# Patient Record
Sex: Male | Born: 1935 | Race: White | Hispanic: No | Marital: Married | Smoking: Former smoker
Health system: Southern US, Community
[De-identification: ages and names within clinical notes are randomized; demographics above are authoritative.]

## PROBLEM LIST (undated history)

## (undated) DIAGNOSIS — Z860100 Personal history of colon polyps, unspecified: Secondary | ICD-10-CM

## (undated) DIAGNOSIS — N183 Chronic kidney disease, stage 3 unspecified: Secondary | ICD-10-CM

## (undated) DIAGNOSIS — C661 Malignant neoplasm of right ureter: Secondary | ICD-10-CM

## (undated) DIAGNOSIS — I4891 Unspecified atrial fibrillation: Secondary | ICD-10-CM

## (undated) DIAGNOSIS — M1990 Unspecified osteoarthritis, unspecified site: Secondary | ICD-10-CM

## (undated) DIAGNOSIS — I2510 Atherosclerotic heart disease of native coronary artery without angina pectoris: Secondary | ICD-10-CM

## (undated) DIAGNOSIS — D494 Neoplasm of unspecified behavior of bladder: Secondary | ICD-10-CM

## (undated) DIAGNOSIS — K219 Gastro-esophageal reflux disease without esophagitis: Secondary | ICD-10-CM

## (undated) DIAGNOSIS — Z8719 Personal history of other diseases of the digestive system: Secondary | ICD-10-CM

## (undated) DIAGNOSIS — E785 Hyperlipidemia, unspecified: Secondary | ICD-10-CM

## (undated) DIAGNOSIS — K449 Diaphragmatic hernia without obstruction or gangrene: Secondary | ICD-10-CM

## (undated) DIAGNOSIS — I252 Old myocardial infarction: Secondary | ICD-10-CM

## (undated) DIAGNOSIS — Z951 Presence of aortocoronary bypass graft: Secondary | ICD-10-CM

## (undated) DIAGNOSIS — I509 Heart failure, unspecified: Secondary | ICD-10-CM

## (undated) DIAGNOSIS — I10 Essential (primary) hypertension: Secondary | ICD-10-CM

## (undated) DIAGNOSIS — Z8711 Personal history of peptic ulcer disease: Secondary | ICD-10-CM

## (undated) HISTORY — PX: CORONARY ARTERY BYPASS GRAFT: SHX141

## (undated) HISTORY — PX: CATARACT EXTRACTION W/ INTRAOCULAR LENS  IMPLANT, BILATERAL: SHX1307

---

## 1999-06-05 ENCOUNTER — Encounter: Payer: Self-pay | Admitting: Internal Medicine

## 1999-06-05 ENCOUNTER — Encounter: Admission: RE | Admit: 1999-06-05 | Discharge: 1999-06-05 | Payer: Self-pay | Admitting: Internal Medicine

## 1999-07-04 ENCOUNTER — Encounter: Payer: Self-pay | Admitting: Interventional Cardiology

## 1999-07-04 ENCOUNTER — Inpatient Hospital Stay (HOSPITAL_COMMUNITY): Admission: EM | Admit: 1999-07-04 | Discharge: 1999-07-13 | Payer: Self-pay | Admitting: Emergency Medicine

## 1999-07-05 HISTORY — PX: CARDIAC CATHETERIZATION: SHX172

## 1999-07-08 ENCOUNTER — Encounter: Payer: Self-pay | Admitting: Cardiothoracic Surgery

## 1999-07-09 ENCOUNTER — Encounter: Payer: Self-pay | Admitting: Cardiothoracic Surgery

## 1999-07-10 ENCOUNTER — Encounter: Payer: Self-pay | Admitting: Cardiothoracic Surgery

## 1999-07-11 ENCOUNTER — Encounter: Payer: Self-pay | Admitting: Cardiothoracic Surgery

## 2001-03-23 ENCOUNTER — Encounter: Payer: Self-pay | Admitting: Internal Medicine

## 2001-03-23 ENCOUNTER — Encounter: Admission: RE | Admit: 2001-03-23 | Discharge: 2001-03-23 | Payer: Self-pay | Admitting: Internal Medicine

## 2001-06-22 ENCOUNTER — Ambulatory Visit (HOSPITAL_COMMUNITY): Admission: RE | Admit: 2001-06-22 | Discharge: 2001-06-22 | Payer: Self-pay | Admitting: Gastroenterology

## 2002-11-25 ENCOUNTER — Encounter: Admission: RE | Admit: 2002-11-25 | Discharge: 2002-11-25 | Payer: Self-pay | Admitting: Internal Medicine

## 2005-02-14 ENCOUNTER — Encounter: Admission: RE | Admit: 2005-02-14 | Discharge: 2005-02-14 | Payer: Self-pay | Admitting: Internal Medicine

## 2007-02-05 ENCOUNTER — Encounter: Admission: RE | Admit: 2007-02-05 | Discharge: 2007-02-05 | Payer: Self-pay | Admitting: Internal Medicine

## 2010-06-14 NOTE — H&P (Signed)
[place]  Patient:    Angel Cooper, Angel Cooper                    MRN: [id number] Adm. Date:  [id number] Attending:  Cathren Laine CC:         Winn Jock. Earl Gala, M.D.                         History and Physical  REASON FOR ADMISSION:  Acute onset of epigastric discomfort.  SUBJECTIVE: The patient is 77 years of age and has a history of coronary artery disease. He suffered a "myocardial infarction" in 1989 based upon laboratory data obtained from an urgent care center in [place].  [facility], he underwent exercise treadmill testing by Dr. Earl Gala that did not demonstrate any evidence of ischemia and has been relatively asymptomatic since that time.  Since that episode, the patient has had no symptoms.  His symptoms at that time were epigastric discomfort.  The patient developed epigastric discomfort this morning and persisted most of the day.  He saw Dr. Dorna Bloom in the [facility] in the early afternoon, where the initial EKG revealed ST segment depression, V3 through V5.  Because of the EKG appearance and the patients history, he was admitted to the hospital to rule out myocardial infarction.  He is continuing to have some mild discomfort in his chest, although on IV nitroglycerin and IV heparin his symptoms are improved.  MEDICATIONS: Prilosec 20 mg per day. Viagra 50 mg 1/2 p.r.n. Tylenol p.r.n. Darvocet N 100 p.r.n.  Doxycycline for skin disorder was stopped 24 hours ago because of queasy stomach.  ALLERGIES:  Aspirin.  SIGNIFICANT MEDICAL PROBLEMS: 1. Coronary artery disease. 2. History of colon polyps diagnosed in 1989. 3. Benign positional vertigo. 4. History of peptic ulcer disease with duodenal ulcer in 1989. 5. Lower back pain, chronic. 6. Osteoarthritis.  FAMILY HISTORY:  Father died at age 24 of complications of hypertension. Mother died age 80 of heart failure and coronary artery  disease.  HABITS: Quit smoking in 1967.  Has alcohol rarely.  REVIEW OF SYSTEMS:  No specific complaints.  OBJECTIVE:  GENERAL: On exam the patient is in no acute distress.  VITAL SIGNS:  Blood pressure 122/88.  Heart rate is ranging between 50 and 80 in sinus rhythm.  SKIN: Clear.  No nail bed cyanosis is noted.  HEENT:  Pupils are equal and reactive to light.  Extraocular movements are full.  NECK: Reveals no JVD, carotid bruits or thyromegaly.  LUNGS: Clear to auscultation and percussion.  CARDIAC: Normal. No murmurs, rubs, clicks or gallops are heard.  ABDOMEN:  Soft.  Liver and spleen are not palpable.  Bowel sounds are normal. No pulsatile masses are noted.  EXTREMITIES: Reveal no edema. Femoral pulses are 2+. Radial pulses are 2+. Posterior tibial pulses are 2+. There is no edema.  NEUROLOGIC: Intact.  No motor or sensory deficits are noted.  LABORATORY DATA:  Chest x-ray:  Pending.  CK MB 120/9.0.  Troponin I 0.10. EKG #1 as outlined above with ST segment depression in V2 through V5. Subsequent EKGs did not reveal ST abnormality. Hemoglobin is normal. White blood cell count 13,000.  Lipase 21, amylase 55, potassium 3.9.  BUN and creatinine 13 and 0.9.  ASSESSMENT:  Acute coronary syndrome.  PLAN: 1. IV heparin. 2. IIb/III inhibitor, Integrilin. 3. IV nitroglycerin and IV morphine titrated to pain control. 4. Serial  enzymes and EKGs. 5. Consider GI workup if abdominal discomfort continues with abdominal CT scan    to rule out abdominal abdominal aortic aneurysm.  The patient will likely    need cardiac catheterization unless some other significant identifiable    problem arises. DD:  07/04/99 TD:  07/05/99 Job: [id number]

## 2010-06-14 NOTE — Cardiovascular Report (Signed)
[place]  Patient:    Angel Cooper, Angel Cooper                    MRN: [id number] Proc. Date: 07/05/99 Adm. Date:  [id number] Disc. Date: [id number] Attending:  Cathren Laine CC:         CVTS             Winn Jock. Earl Gala, M.D.             Wayne C. Dorna Bloom, M.D.                        Cardiac Catheterization  CINE NO. [id number]  INDICATION FOR STUDY:  Non-Q-wave myocardial infarction.  PROCEDURE PERFORMED: 1. Left heart catheterization. 2. Selective coronary angiography. 3. Left ventriculography.  DESCRIPTION OF PROCEDURE:  After informed consent, a 6-French sheath was inserted into the right femoral artery using the modified Seldinger technique. A 6-French A2 multipurpose catheter was used for hemodynamic recordings,  left ventriculography and right coronary angiography.  A #4 left Judkins catheter 6-French was used for left coronary angiography.  The patient tolerated the procedure without complications.  RESULTS:   I. Hemodynamic data      A. Aortic pressure 116/64 mmHg.      B. Left ventricular pressure 116/14 mmHg.  II. Left ventriculography:  There is inferoapical severe hypokinesis.  EF is      40-45%. III. Selective coronary angiography:  There is calcification in the vessels      involving the left coronary system that are noticeable by cine      fluoroscopy.      A. Left main coronary:  There is proximal 20% narrowing.      B. Left anterior descending coronary:  This is a large vessel that wraps        around the left ventricular apex.  Two large diagonal branches arise         proximally and from the mid vessel.  There is ostial 70-80% LAD         narrowing.  In the proximal LAD between the first and second         diagonal, there is a focal eccentric 70% stenosis.  The entire         proximal portion of the LAD is diffusely diseased.  The diagonal         branches are free of significant disease.  The LAD wraps around the          apex.      C. Circumflex artery:  The circumflex artery is large.  It gives origin         to two dominant obtuse marginal branches.  The first obtuse marginal         arises from the mid circumflex and contains an ostial 70-80% stenosis         seen only in the AP caudal view.  There is 50-60% ostial circumflex         disease and a segmental 80% stenosis in the distal circumflex before         the large obtuse marginal branch that supplies the inferolateral         wall.      D. Ramus intermedius branch:  A large ramus intermedius branch arises         from the left main and contains a 99% stenosis with TIMI-2 flow.  E. Right coronary:  The right coronary is totally occluded proximally         and fills late by right-to-right collaterals.  CONCLUSIONS: 1. Recent lateral and apical infarction due to transient occlusion of the    ramus intermedius branch. 2. Mild left ventricular dysfunction with EF of 45%. 3. Severe three-vessel coronary disease with high grade proximal and mid LAD    disease, subtotal occlusion of the ramus branch, significant first obtuse    marginal and mid circumflex stenosis, and total occlusion of the right    coronary.  RECOMMENDATION:  Coronary artery bypass grafting. DD:  07/05/99 TD:  07/09/99 Job: [id number]

## 2012-10-27 ENCOUNTER — Other Ambulatory Visit: Payer: Self-pay | Admitting: Internal Medicine

## 2012-10-27 DIAGNOSIS — R109 Unspecified abdominal pain: Secondary | ICD-10-CM

## 2012-10-29 ENCOUNTER — Inpatient Hospital Stay: Admission: RE | Admit: 2012-10-29 | Payer: Self-pay | Source: Ambulatory Visit

## 2012-11-04 ENCOUNTER — Ambulatory Visit
Admission: RE | Admit: 2012-11-04 | Discharge: 2012-11-04 | Disposition: A | Discharge status: Home / Self Care | Payer: Medicare Other | Source: Ambulatory Visit | Attending: Internal Medicine | Admitting: Internal Medicine

## 2012-11-04 DIAGNOSIS — R109 Unspecified abdominal pain: Secondary | ICD-10-CM

## 2012-11-04 MED ORDER — IOHEXOL 300 MG/ML  SOLN
100.0000 mL | Freq: Once | INTRAMUSCULAR | Status: AC | PRN
  Administered 2012-11-04: 100 mL via INTRAVENOUS

## 2012-11-24 ENCOUNTER — Other Ambulatory Visit: Payer: Self-pay | Admitting: Urology

## 2012-11-24 NOTE — Progress Notes (Signed)
Need orders in EPIC.  Surgery scheduled for 12/03/12.  Thank You.

## 2012-11-25 ENCOUNTER — Encounter (HOSPITAL_COMMUNITY): Payer: Self-pay | Admitting: Pharmacy Technician

## 2012-11-30 ENCOUNTER — Other Ambulatory Visit (HOSPITAL_COMMUNITY): Payer: Self-pay | Admitting: Urology

## 2012-11-30 NOTE — Patient Instructions (Addendum)
22 Angel Cooper  11/30/2012   Your procedure is scheduled on:  12/03/12  FRIDAY  Report to [facility] at  0830    AM.  Call this number if you have problems the morning of surgery: [phone]       Remember:   Do not eat food  Or drink :After Midnight. Thursday NIGHT   Take these medicines the morning of surgery with A SIP OF WATER: Omeprazole   .  Contacts, dentures or partial plates can not be worn to surgery  Leave suitcase in the car. After surgery it may be brought to your room.  For patients admitted to the hospital, checkout time is 11:00 AM day of  discharge.             SPECIAL INSTRUCTIONS- SEE [place] PREPARING FOR SURGERY INSTRUCTION SHEET-     DO NOT WEAR JEWELRY, LOTIONS, POWDERS, OR PERFUMES.  WOMEN-- DO NOT SHAVE LEGS OR UNDERARMS FOR 12 HOURS BEFORE SHOWERS. MEN MAY SHAVE FACE.  Patients discharged the day of surgery will not be allowed to drive home. IF going home the day of surgery, you must have a driver and someone to stay with you for the first 24 hours  Name and phone number of your driver:     Wife  Alison Stalling  PST [phone]  [phone]                 FAILURE TO FOLLOW THESE INSTRUCTIONS MAY RESULT IN  CANCELLATION   OF YOUR SURGERY                                                  Patient Signature _____________________________

## 2012-12-01 ENCOUNTER — Encounter (HOSPITAL_COMMUNITY)
Admission: RE | Admit: 2012-12-01 | Discharge: 2012-12-01 | Disposition: A | Discharge status: Home / Self Care | Payer: Medicare Other | Source: Ambulatory Visit | Attending: Urology | Admitting: Urology

## 2012-12-01 ENCOUNTER — Encounter (HOSPITAL_COMMUNITY): Payer: Self-pay

## 2012-12-01 ENCOUNTER — Ambulatory Visit (HOSPITAL_COMMUNITY)
Admission: RE | Admit: 2012-12-01 | Discharge: 2012-12-01 | Disposition: A | Discharge status: Home / Self Care | Payer: Medicare Other | Source: Ambulatory Visit | Attending: Urology | Admitting: Urology

## 2012-12-01 DIAGNOSIS — N289 Disorder of kidney and ureter, unspecified: Secondary | ICD-10-CM | POA: Insufficient documentation

## 2012-12-01 DIAGNOSIS — Z01818 Encounter for other preprocedural examination: Secondary | ICD-10-CM | POA: Insufficient documentation

## 2012-12-01 DIAGNOSIS — Z01812 Encounter for preprocedural laboratory examination: Secondary | ICD-10-CM | POA: Insufficient documentation

## 2012-12-01 DIAGNOSIS — Z01810 Encounter for preprocedural cardiovascular examination: Secondary | ICD-10-CM | POA: Insufficient documentation

## 2012-12-01 HISTORY — DX: Gastro-esophageal reflux disease without esophagitis: K21.9

## 2012-12-01 HISTORY — DX: Atherosclerotic heart disease of native coronary artery without angina pectoris: I25.10

## 2012-12-01 HISTORY — DX: Essential (primary) hypertension: I10

## 2012-12-01 LAB — BASIC METABOLIC PANEL
BUN: 17 mg/dL (ref 6–23)
CO2: 26 mEq/L (ref 19–32)
Calcium: 9.4 mg/dL (ref 8.4–10.5)
Chloride: 105 mEq/L (ref 96–112)
Creatinine, Ser: 1.12 mg/dL (ref 0.50–1.35)
GFR calc Af Amer: 70 mL/min — ABNORMAL LOW (ref >90)
GFR calc non Af Amer: 60 mL/min — ABNORMAL LOW (ref >90)
Glucose, Bld: 93 mg/dL (ref 70–99)
Potassium: 4.3 mEq/L (ref 3.5–5.1)
Sodium: 140 mEq/L (ref 135–145)

## 2012-12-01 LAB — CBC
HCT: 38.7 % — ABNORMAL LOW (ref 39.0–52.0)
Hemoglobin: 13.1 g/dL (ref 13.0–17.0)
MCH: 30.5 pg (ref 26.0–34.0)
MCHC: 33.9 g/dL (ref 30.0–36.0)
MCV: 90.2 fL (ref 78.0–100.0)
Platelets: 314 10*3/uL (ref 150–400)
RBC: 4.29 MIL/uL (ref 4.22–5.81)
RDW: 13.4 % (ref 11.5–15.5)
WBC: 9.4 10*3/uL (ref 4.0–10.5)

## 2012-12-01 NOTE — Progress Notes (Signed)
12/01/12 0828  OBSTRUCTIVE SLEEP APNEA  Have you ever been diagnosed with sleep apnea through a sleep study? No  Do you snore loudly (loud enough to be heard through closed doors)?  0  Do you often feel tired, fatigued, or sleepy during the daytime? 1  Has anyone observed you stop breathing during your sleep? 0  Do you have, or are you being treated for high blood pressure? 1  BMI more than 35 kg/m2? 0  Age over 77 years old? 1  Neck circumference greater than 40 cm/18 inches? 0  Gender: 1  Obstructive Sleep Apnea Score 4  Score 4 or greater  Results sent to PCP

## 2012-12-03 ENCOUNTER — Ambulatory Visit (HOSPITAL_COMMUNITY): Payer: Medicare Other | Admitting: Anesthesiology

## 2012-12-03 ENCOUNTER — Ambulatory Visit (HOSPITAL_COMMUNITY)
Admission: RE | Admit: 2012-12-03 | Discharge: 2012-12-03 | Disposition: A | Discharge status: Home / Self Care | Payer: Medicare Other | Source: Ambulatory Visit | Attending: Urology | Admitting: Urology

## 2012-12-03 ENCOUNTER — Encounter (HOSPITAL_COMMUNITY): Payer: Self-pay | Admitting: *Deleted

## 2012-12-03 ENCOUNTER — Encounter (HOSPITAL_COMMUNITY)
Admission: RE | Disposition: A | Discharge status: Home / Self Care | Payer: Self-pay | Source: Ambulatory Visit | Attending: Urology

## 2012-12-03 ENCOUNTER — Encounter (HOSPITAL_COMMUNITY): Payer: Medicare Other | Admitting: Anesthesiology

## 2012-12-03 DIAGNOSIS — R1033 Periumbilical pain: Secondary | ICD-10-CM | POA: Insufficient documentation

## 2012-12-03 DIAGNOSIS — R634 Abnormal weight loss: Secondary | ICD-10-CM | POA: Insufficient documentation

## 2012-12-03 DIAGNOSIS — I252 Old myocardial infarction: Secondary | ICD-10-CM | POA: Insufficient documentation

## 2012-12-03 DIAGNOSIS — I10 Essential (primary) hypertension: Secondary | ICD-10-CM | POA: Insufficient documentation

## 2012-12-03 DIAGNOSIS — K219 Gastro-esophageal reflux disease without esophagitis: Secondary | ICD-10-CM | POA: Insufficient documentation

## 2012-12-03 DIAGNOSIS — N2889 Other specified disorders of kidney and ureter: Secondary | ICD-10-CM

## 2012-12-03 DIAGNOSIS — E7800 Pure hypercholesterolemia, unspecified: Secondary | ICD-10-CM | POA: Insufficient documentation

## 2012-12-03 DIAGNOSIS — K449 Diaphragmatic hernia without obstruction or gangrene: Secondary | ICD-10-CM | POA: Insufficient documentation

## 2012-12-03 DIAGNOSIS — K5730 Diverticulosis of large intestine without perforation or abscess without bleeding: Secondary | ICD-10-CM | POA: Insufficient documentation

## 2012-12-03 DIAGNOSIS — R911 Solitary pulmonary nodule: Secondary | ICD-10-CM | POA: Insufficient documentation

## 2012-12-03 DIAGNOSIS — Q762 Congenital spondylolisthesis: Secondary | ICD-10-CM | POA: Insufficient documentation

## 2012-12-03 DIAGNOSIS — N289 Disorder of kidney and ureter, unspecified: Secondary | ICD-10-CM | POA: Insufficient documentation

## 2012-12-03 HISTORY — PX: CYSTOSCOPY WITH RETROGRADE PYELOGRAM, URETEROSCOPY AND STENT PLACEMENT: SHX5789

## 2012-12-03 SURGERY — CYSTOURETEROSCOPY, WITH RETROGRADE PYELOGRAM AND STENT INSERTION
Anesthesia: General | Laterality: Right | Wound class: Clean Contaminated

## 2012-12-03 MED ORDER — PROPOFOL 10 MG/ML IV BOLUS
INTRAVENOUS | Status: DC | PRN
  Administered 2012-12-03: 140 mg via INTRAVENOUS

## 2012-12-03 MED ORDER — LIDOCAINE HCL 2 % EX GEL
CUTANEOUS | Status: AC
  Filled 2012-12-03: qty 10

## 2012-12-03 MED ORDER — IOHEXOL 300 MG/ML  SOLN
INTRAMUSCULAR | Status: DC | PRN
  Administered 2012-12-03: 10 mL

## 2012-12-03 MED ORDER — OXYCODONE HCL 5 MG/5ML PO SOLN
5.0000 mg | Freq: Once | ORAL | Status: DC | PRN
  Filled 2012-12-03: qty 5

## 2012-12-03 MED ORDER — OXYCODONE HCL 5 MG PO TABS: 5.0000 mg | ORAL_TABLET | Freq: Once | ORAL | Status: DC | PRN

## 2012-12-03 MED ORDER — SODIUM CHLORIDE 0.9 % IR SOLN
Status: DC | PRN
  Administered 2012-12-03: 2000 mL via INTRAVESICAL

## 2012-12-03 MED ORDER — BELLADONNA ALKALOIDS-OPIUM 16.2-60 MG RE SUPP
RECTAL | Status: AC
  Filled 2012-12-03: qty 1

## 2012-12-03 MED ORDER — CIPROFLOXACIN IN D5W 400 MG/200ML IV SOLN
INTRAVENOUS | Status: AC
  Filled 2012-12-03: qty 200

## 2012-12-03 MED ORDER — MEPERIDINE HCL 50 MG/ML IJ SOLN: 6.2500 mg | INTRAMUSCULAR | Status: DC | PRN

## 2012-12-03 MED ORDER — DEXAMETHASONE SODIUM PHOSPHATE 10 MG/ML IJ SOLN
INTRAMUSCULAR | Status: DC | PRN
  Administered 2012-12-03: 10 mg via INTRAVENOUS

## 2012-12-03 MED ORDER — ACETAMINOPHEN-CODEINE #3 300-30 MG PO TABS: 1.0000 | ORAL_TABLET | ORAL | Status: DC | PRN

## 2012-12-03 MED ORDER — PROMETHAZINE HCL 25 MG/ML IJ SOLN: 6.2500 mg | INTRAMUSCULAR | Status: DC | PRN

## 2012-12-03 MED ORDER — LACTATED RINGERS IV SOLN
INTRAVENOUS | Status: DC
  Administered 2012-12-03: 1000 mL via INTRAVENOUS

## 2012-12-03 MED ORDER — HYDROMORPHONE HCL PF 1 MG/ML IJ SOLN
INTRAMUSCULAR | Status: AC
  Filled 2012-12-03: qty 1

## 2012-12-03 MED ORDER — TAMSULOSIN HCL 0.4 MG PO CAPS: 0.4000 mg | ORAL_CAPSULE | Freq: Every day | ORAL | Status: DC

## 2012-12-03 MED ORDER — HYDROMORPHONE HCL PF 1 MG/ML IJ SOLN
0.2500 mg | INTRAMUSCULAR | Status: DC | PRN
  Administered 2012-12-03: 0.5 mg via INTRAVENOUS

## 2012-12-03 MED ORDER — FENTANYL CITRATE 0.05 MG/ML IJ SOLN
INTRAMUSCULAR | Status: DC | PRN
  Administered 2012-12-03 (×4): 25 ug via INTRAVENOUS

## 2012-12-03 MED ORDER — LIDOCAINE HCL 2 % EX GEL
CUTANEOUS | Status: DC | PRN
  Administered 2012-12-03: 1 via URETHRAL

## 2012-12-03 MED ORDER — BELLADONNA ALKALOIDS-OPIUM 16.2-60 MG RE SUPP
RECTAL | Status: DC | PRN
  Administered 2012-12-03: 1 via RECTAL

## 2012-12-03 MED ORDER — LIDOCAINE HCL (CARDIAC) 20 MG/ML IV SOLN
INTRAVENOUS | Status: DC | PRN
  Administered 2012-12-03: 100 mg via INTRAVENOUS

## 2012-12-03 MED ORDER — CIPROFLOXACIN HCL 500 MG PO TABS: 500.0000 mg | ORAL_TABLET | Freq: Two times a day (BID) | ORAL | Status: DC

## 2012-12-03 MED ORDER — CIPROFLOXACIN IN D5W 400 MG/200ML IV SOLN
400.0000 mg | INTRAVENOUS | Status: AC
  Administered 2012-12-03: 400 mg via INTRAVENOUS

## 2012-12-03 MED ORDER — ONDANSETRON HCL 4 MG/2ML IJ SOLN
INTRAMUSCULAR | Status: DC | PRN
  Administered 2012-12-03: 4 mg via INTRAVENOUS

## 2012-12-03 SURGICAL SUPPLY — 14 items
BAG URO CATCHER STRL LF (DRAPE) ×2 IMPLANT
CATH URET 5FR 28IN CONE TIP (BALLOONS) ×1
CATH URET 5FR 70CM CONE TIP (BALLOONS) ×1 IMPLANT
CLOTH BEACON ORANGE TIMEOUT ST (SAFETY) ×2 IMPLANT
DRAPE CAMERA CLOSED 9X96 (DRAPES) ×2 IMPLANT
GLOVE BIOGEL M 8.0 STRL (GLOVE) ×2 IMPLANT
GOWN PREVENTION PLUS XLARGE (GOWN DISPOSABLE) ×2 IMPLANT
GOWN STRL REIN XL XLG (GOWN DISPOSABLE) ×2 IMPLANT
GUIDEWIRE STR DUAL SENSOR (WIRE) ×2 IMPLANT
MANIFOLD NEPTUNE II (INSTRUMENTS) ×2 IMPLANT
PACK CYSTO (CUSTOM PROCEDURE TRAY) ×2 IMPLANT
STENT CONTOUR 6FRX26X.038 (STENTS) ×1 IMPLANT
TUBING CONNECTING 10 (TUBING) ×2 IMPLANT
WIRE COONS/BENSON .038X145CM (WIRE) ×1 IMPLANT

## 2012-12-03 NOTE — Transfer of Care (Signed)
Immediate Anesthesia Transfer of Care Note  Patient: Angel Cooper  Procedure(s) Performed: Procedure(s): CYSTOSCOPY WITH RIGHT RETROGRADE PYELOGRAM,RIGHT URETERAL WASHINGS, RIGHT URETERAL BIOPSY, RIGHT URETEROSCOPY AND STENT PLACEMENT (Right)  Patient Location: PACU  Anesthesia Type:General  Level of Consciousness: awake, alert  and oriented  Airway & Oxygen Therapy: Patient Spontanous Breathing and Patient connected to face mask oxygen  Post-op Assessment: Report given to PACU RN and Post -op Vital signs reviewed and stable  Post vital signs: Reviewed and stable  Complications: No apparent anesthesia complications

## 2012-12-03 NOTE — Anesthesia Preprocedure Evaluation (Addendum)
Anesthesia Evaluation  Patient identified by MRN, date of birth, ID band Patient awake    Reviewed: Allergy & Precautions, H&P , NPO status , Patient's Chart, lab work & pertinent test results  Airway Mallampati: II TM Distance: >3 FB Neck ROM: Full    Dental  (+) Dental Advisory Given   Pulmonary shortness of breath,  breath sounds clear to auscultation        Cardiovascular hypertension, Pt. on medications + CAD, + Past MI and + CABG Rhythm:Regular Rate:Normal     Neuro/Psych negative neurological ROS  negative psych ROS   GI/Hepatic Neg liver ROS, hiatal hernia, GERD-  Medicated,  Endo/Other  negative endocrine ROS  Renal/GU negative Renal ROS     Musculoskeletal negative musculoskeletal ROS (+)   Abdominal   Peds  Hematology negative hematology ROS (+)   Anesthesia Other Findings   Reproductive/Obstetrics negative OB ROS                          Anesthesia Physical Anesthesia Plan  ASA: III  Anesthesia Plan: General   Post-op Pain Management:    Induction: Intravenous  Airway Management Planned: LMA  Additional Equipment:   Intra-op Plan:   Post-operative Plan: Extubation in OR  Informed Consent: I have reviewed the patients History and Physical, chart, labs and discussed the procedure including the risks, benefits and alternatives for the proposed anesthesia with the patient or authorized representative who has indicated his/her understanding and acceptance.   Dental advisory given  Plan Discussed with: CRNA  Anesthesia Plan Comments:         Anesthesia Quick Evaluation

## 2012-12-03 NOTE — H&P (Signed)
Reason For Visit Right renal pelvis mass   History of Present Illness  This is an 79M referred by Dr. Doristine Locks, MD for evaluation and management of right renal pelvis mass lesion seen on CT which was performed for abdominal pain and weight loss.    Patient's pain has been going on for several months now, it is located in the his epigastric/peri-umbilical region. It is improved when he eats. It is not constant, but comes on daily. He has lost 8 lbs. Has had associated slower bowel motility, hasn't noticed any blood per rectum. He denies any hematuria/dysuria/flank pain. PAtient states that he's had stomach pain for > 8yrs and that often time he could double his prilosec and his symptoms would improve, however this hasn't worked for him this time.    The patient has no history of recurrent kidney infections. he states he passed a stone a least 20 years ago and can't remember which side.     He has no PMH or FH of GU malignancy. He has a 10pkyr history of smoking. 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    Patient has a history of CABG in 2000 and hasn't had any angina since his surgery. He is able to work in the yard, but is limited by the pain in his knees.1     1 Amended By: Berniece Salines; Nov 22 2012 6:21 PM EST  Past Medical History Problems  1. History of Anxiety (300.00) 2. History of acute myocardial infarction (412) 3. History of arthritis (V13.4) 4. History of cardiac disorder (V12.50) 5. History of esophageal reflux (V12.79) 6. History of gastric ulcer (V12.79) 7. History of hypercholesterolemia (V12.29) 8. History of hypertension (V12.59)  Surgical History Problems  1. History of Heart Surgery  Current Meds 1. Tylenol TABS; TAKE TABLET  PRN;  Therapy: (Recorded:27Oct2014) to Recorded 2. Vitamin B12 TABS;  Therapy: (Recorded:27Oct2014) to Recorded  Allergies Medication  1. Aspirin TABS 2. Benadryl TABS  Family History Problems  1.  Family history of malignant neoplasm (V16.9) : Mother  Social History Problems    Caffeine use (305.90)   Death in the family, father   age 79 stroke   Death in the family, mother   age 61 complications from stomach surgery   Former smoker Chemical engineer)   smoked 1 ppd for 10 years quit for 40 years   Retired   Two children   2 sons  Review of Systems Genitourinary, constitutional, skin, eye, otolaryngeal, hematologic/lymphatic, cardiovascular, pulmonary, endocrine, musculoskeletal, gastrointestinal, neurological and psychiatric system(s) were reviewed and pertinent findings if present are noted.  Genitourinary: nocturia and erectile dysfunction.  Gastrointestinal: heartburn, diarrhea and constipation.  Integumentary: skin rash/lesion and pruritus.  Cardiovascular: leg swelling.  Musculoskeletal: back pain and joint pain.  Neurological: headache.    Vitals Vital Signs [Data Includes: Last 1 Day]  Recorded: 27Oct2014 03:56PM  Height: 5 ft 7 in Weight: 168 lb  BMI Calculated: 26.31 BSA Calculated: 1.88 Blood Pressure: 137 / 71 Temperature: 98.6 F Heart Rate: 87  Physical Exam Constitutional: Well nourished and well developed . No acute distress.  ENT:. The ears and nose are normal in appearance.  Neck: The appearance of the neck is normal and no neck mass is present.  Pulmonary: No respiratory distress and normal respiratory rhythm and effort.  Cardiovascular: Heart rate and rhythm are normal . No peripheral edema.  Abdomen: The abdomen is soft and nontender. No masses are palpated. No CVA tenderness. No hernias are  palpable. No hepatosplenomegaly noted.  Genitourinary: Examination of the penis demonstrates no discharge, no masses, no lesions and a normal meatus. The penis is circumcised. The scrotum is normal in appearance and without lesions. The right epididymis is palpably normal and non-tender. The left epididymis is palpably normal and non-tender. The right testis is  palpably normal, non-tender and without masses. The left testis is normal, non-tender and without masses.  Lymphatics: The femoral and inguinal nodes are not enlarged or tender.  Skin: Normal skin turgor, no visible rash and no visible skin lesions.  Neuro/Psych:. Mood and affect are appropriate.    Results/Data Urine [Data Includes: Last 1 Day]   27Oct2014  COLOR YELLOW   APPEARANCE CLEAR   SPECIFIC GRAVITY 1.020   pH 6.0   GLUCOSE NEG mg/dL  BILIRUBIN NEG   KETONE NEG mg/dL  BLOOD TRACE   PROTEIN NEG mg/dL  UROBILINOGEN 0.2 mg/dL  NITRITE NEG   LEUKOCYTE ESTERASE NEG   SQUAMOUS EPITHELIAL/HPF RARE   WBC 0-2 WBC/hpf  RBC 0-2 RBC/hpf  BACTERIA NONE SEEN   CRYSTALS NONE SEEN   CASTS NONE SEEN    CT abdomen/pelvis, with contrast and delayed phase of upper tracts : I have personally reviewed these images and compared them to a CT scan done in 2004 - there is an ill defined mass encircling the right UVJ with thickend urothelium down to distal ureter.    IMPRESSION:  1. Abnormal soft tissue within the right renal pelvis collecting  system and proximal right ureter worrisome for edema or more likely  tumor, possibly transitional cell carcinoma. Correlate clinically.  No hydronephrosis.  2. Stable moderately large hiatal hernia.  3. 5 mm anterolisthesis of L5 on S1 with bilateral pars defects at  L5.  4. Rectosigmoid colon diverticula.  5. 6 mm pleural-based nodule in the posterior medial right lower  lobe. Cannot exclude a metastatic lesion.   Assessment Assessed  1. Ureteral mass (593.9) 2. Periumbilical abdominal pain (789.05) 3. Weight loss, unintentional (783.21)  Plan Health Maintenance  1. UA With REFLEX; Status:Resulted - Requires Verification;   Done: 27Oct2014 03:31PM  Discussion/Summary I went over the patient's CT scan with he and his wife. We discussed the likelihood of this being a malignancy. I recommended that we pursue a diagnosis operatively  including cysto/retrograde pyelogram/uppertract cytology and possibly ureteroscopy/biospy if I can get the scope upto the area of concern. I went over the procedure in detail and outlined the risk/benefits. This procedure should provide [place] with the necessary information for definitive diagnosis at which point we'd discuss further management. We will work to get this scheduled at the patient's convenience. I will touch base with his PCP and get OR clearance.

## 2012-12-03 NOTE — Op Note (Addendum)
Preoperative diagnosis:  1. Right renal pelvis mass   Postoperative diagnosis:  1. As above   Procedure:  1. Cystoscopy 2. Right ureteral washing for cytology 3. Right retrograde pyelogram with interpretation 4. Right ureteroscopy 5. Right renal pelvis/UPJ biopsy 6. Right ureteral stent placement   Surgeon: Crist Fat, MD  Anesthesia: General  Complications: None  Intraoperative findings: Cystoscopy revealed ureteral orifices orthotopic with no bladder mucosal abnormalities. Retrograde pyelogram revealed no filling defects, there was a narrowing at the UPJ which may just be artifact from the phase of the retrograde pyelogram. There was no obvious mass in with ureteroscopy, there was difficulty getting across the UPJ secondary to possible narrowing or stricture, this area was biopsied. A 26 cm x 6 French double-J ureteral stent was placed with the string left on.  EBL: Minimal  Specimens: None  Indication: Angel Cooper is a 79 y.o. patient with right renal pelvis mass as seen on a CT scan which was performed for abdominal pain and weight loss.  After reviewing the management options for treatment, he elected to proceed with the above surgical procedure(s). We have discussed the potential benefits and risks of the procedure, side effects of the proposed treatment, the likelihood of the patient achieving the goals of the procedure, and any potential problems that might occur during the procedure or recuperation. Informed consent has been obtained.  Description of procedure:  The patient was taken to the operating room and general anesthesia was induced.  The patient was placed in the dorsal lithotomy position, prepped and draped in the usual sterile fashion, and preoperative antibiotics were administered. A preoperative time-out was performed.   And 30 22 French rigid cystoscope was then gently passed to the patient's urethra and into the bladder. The 30 lens was then  exchanged for the 70 lens and a 360 cystoscopic evaluation was performed with the above findings. I then cannulated the right ureteral orifice using a 0.38 sensor wire and advance the Pollack catheter over it into the right ureter and up into the right renal pelvis. I then instilled 10 cc of normal saline gently through the [place] and performed a barbotaged. I then aspirated cells and allowed then to drip into the specimen cup, this was sent for right renal pelvis/ ureteral cytology. The Vernell Morgans was then retracted back to the ureteral orifice and a retrograde pyelogram was performed with the above findings. A 0.38 sensor wire was then placed through the [facility] catheter and into the renal pelvis and the [facility] catheter removed over the wire. The bladder was then emptied and the cystoscope removed. A rigid ureteroscope was then gently passed to the patient's urethra and into the bladder under visual guidance. Using a second sensor wire through the rigid scope I was able to cannulate the right ureteral orifice and advanced to rigid scope into the right ureter. I was able to visualize almost entire ureter save the proximal aspect. I then injected contrast through the rigid scope and repeated the retrograde pyelogram again noting no filling defects I then removed the rigid scope over a wire leaving 2 wires in the ureter. We then advanced a flexible ureteroscope over the second wire and into the renal pelvis prior to removing the wire. I had difficulty advancing the scope across the UPJ, I was able to do this with gentle/steady pressure. We then performed pyeloscopy evaluating the entire right renal pelvis and calyces, there were no filling defects or masses noted. Using the  ureteroscopic biopsy forceps  I biopsied the area around the UPJ/stenotic area and remove the flexible ureteroscope. I then placed a 6 [place] by 26 cm double-J ureteral stent into the right renal pelvis under fluoroscopic guidance in the routine  fashion. A nice curl was noted in the renal pelvis as well as the bladder prior to removing the scope. I did leave this drain on the end of the stent and secured it to the ventral aspect of the penile shaft with Mastisol and Tegaderm. I then injected Urojet into the patient's urethra and a B&O suppository into the patient's rectum.  The patient was subsequently awoken and returned to the PACU in excellent condition. There are no perioperative complications. The of the case all laps needles and sponges had been accounted for.  Disposition: Patient will remove his stent in 6 days, and return to clinic in 2 weeks to discuss further/ongoing management.  Crist Fat, M.D.   Addendum: The above surgery was performed on the left side.  All procedural references to right should read left instead.

## 2012-12-03 NOTE — Anesthesia Postprocedure Evaluation (Signed)
Anesthesia Post Note  Patient: Angel Cooper  Procedure(s) Performed: Procedure(s) (LRB): CYSTOSCOPY WITH RIGHT RETROGRADE PYELOGRAM,RIGHT URETERAL WASHINGS, RIGHT URETERAL BIOPSY, RIGHT URETEROSCOPY AND STENT PLACEMENT (Right)  Anesthesia type: General  Patient location: PACU  Post pain: Pain level controlled  Post assessment: Post-op Vital signs reviewed  Last Vitals: BP 175/86  Pulse 85  Temp(Src) 36.9 C  Resp 12  SpO2 96%  Post vital signs: Reviewed  Level of consciousness: sedated  Complications: No apparent anesthesia complications

## 2012-12-06 ENCOUNTER — Encounter (HOSPITAL_COMMUNITY): Payer: Self-pay | Admitting: Urology

## 2012-12-09 ENCOUNTER — Other Ambulatory Visit: Payer: Self-pay | Admitting: Urology

## 2012-12-13 ENCOUNTER — Encounter (HOSPITAL_COMMUNITY): Payer: Self-pay | Admitting: *Deleted

## 2012-12-15 ENCOUNTER — Ambulatory Visit (HOSPITAL_COMMUNITY): Payer: Medicare Other

## 2012-12-15 ENCOUNTER — Ambulatory Visit (HOSPITAL_COMMUNITY): Payer: Medicare Other | Admitting: *Deleted

## 2012-12-15 ENCOUNTER — Encounter (HOSPITAL_COMMUNITY): Payer: Self-pay | Admitting: *Deleted

## 2012-12-15 ENCOUNTER — Encounter (HOSPITAL_COMMUNITY)
Admission: RE | Disposition: A | Discharge status: Home / Self Care | Payer: Self-pay | Source: Ambulatory Visit | Attending: Urology

## 2012-12-15 ENCOUNTER — Ambulatory Visit (HOSPITAL_COMMUNITY)
Admission: RE | Admit: 2012-12-15 | Discharge: 2012-12-15 | Disposition: A | Discharge status: Home / Self Care | Payer: Medicare Other | Source: Ambulatory Visit | Attending: Urology | Admitting: Urology

## 2012-12-15 DIAGNOSIS — K219 Gastro-esophageal reflux disease without esophagitis: Secondary | ICD-10-CM | POA: Insufficient documentation

## 2012-12-15 DIAGNOSIS — N2889 Other specified disorders of kidney and ureter: Secondary | ICD-10-CM

## 2012-12-15 DIAGNOSIS — I252 Old myocardial infarction: Secondary | ICD-10-CM | POA: Insufficient documentation

## 2012-12-15 DIAGNOSIS — I2510 Atherosclerotic heart disease of native coronary artery without angina pectoris: Secondary | ICD-10-CM | POA: Insufficient documentation

## 2012-12-15 DIAGNOSIS — I10 Essential (primary) hypertension: Secondary | ICD-10-CM | POA: Insufficient documentation

## 2012-12-15 DIAGNOSIS — N289 Disorder of kidney and ureter, unspecified: Secondary | ICD-10-CM | POA: Insufficient documentation

## 2012-12-15 DIAGNOSIS — Z951 Presence of aortocoronary bypass graft: Secondary | ICD-10-CM | POA: Insufficient documentation

## 2012-12-15 HISTORY — PX: CYSTOSCOPY WITH RETROGRADE PYELOGRAM, URETEROSCOPY AND STENT PLACEMENT: SHX5789

## 2012-12-15 SURGERY — CYSTOURETEROSCOPY, WITH RETROGRADE PYELOGRAM AND STENT INSERTION
Anesthesia: General | Site: Ureter | Laterality: Right | Wound class: Clean Contaminated

## 2012-12-15 MED ORDER — CIPROFLOXACIN IN D5W 400 MG/200ML IV SOLN
400.0000 mg | INTRAVENOUS | Status: AC
  Administered 2012-12-15: 400 mg via INTRAVENOUS

## 2012-12-15 MED ORDER — METOCLOPRAMIDE HCL 5 MG/ML IJ SOLN
INTRAMUSCULAR | Status: DC | PRN
  Administered 2012-12-15: 10 mg via INTRAVENOUS

## 2012-12-15 MED ORDER — DEXAMETHASONE SODIUM PHOSPHATE 10 MG/ML IJ SOLN
INTRAMUSCULAR | Status: AC
  Filled 2012-12-15: qty 1

## 2012-12-15 MED ORDER — LACTATED RINGERS IV SOLN
INTRAVENOUS | Status: DC | PRN
  Administered 2012-12-15 (×2): via INTRAVENOUS

## 2012-12-15 MED ORDER — SODIUM CHLORIDE 0.9 % IR SOLN
Status: DC | PRN
  Administered 2012-12-15: 3000 mL
  Administered 2012-12-15: 1000 mL

## 2012-12-15 MED ORDER — FENTANYL CITRATE 0.05 MG/ML IJ SOLN
INTRAMUSCULAR | Status: AC
  Filled 2012-12-15: qty 2

## 2012-12-15 MED ORDER — MEPERIDINE HCL 50 MG/ML IJ SOLN: 6.2500 mg | INTRAMUSCULAR | Status: DC | PRN

## 2012-12-15 MED ORDER — LIDOCAINE HCL 2 % EX GEL
CUTANEOUS | Status: DC | PRN
  Administered 2012-12-15: 1

## 2012-12-15 MED ORDER — FENTANYL CITRATE 0.05 MG/ML IJ SOLN
INTRAMUSCULAR | Status: DC | PRN
  Administered 2012-12-15: 50 ug via INTRAVENOUS
  Administered 2012-12-15 (×6): 25 ug via INTRAVENOUS
  Administered 2012-12-15: 50 ug via INTRAVENOUS

## 2012-12-15 MED ORDER — PROMETHAZINE HCL 25 MG/ML IJ SOLN: 6.2500 mg | INTRAMUSCULAR | Status: DC | PRN

## 2012-12-15 MED ORDER — CIPROFLOXACIN IN D5W 400 MG/200ML IV SOLN
INTRAVENOUS | Status: AC
  Filled 2012-12-15: qty 200

## 2012-12-15 MED ORDER — ONDANSETRON HCL 4 MG/2ML IJ SOLN
INTRAMUSCULAR | Status: DC | PRN
  Administered 2012-12-15: 4 mg via INTRAVENOUS

## 2012-12-15 MED ORDER — SODIUM CHLORIDE 0.9 % IR SOLN
Status: DC | PRN
  Administered 2012-12-15: 1000 mL

## 2012-12-15 MED ORDER — METOCLOPRAMIDE HCL 5 MG/ML IJ SOLN
INTRAMUSCULAR | Status: AC
  Filled 2012-12-15: qty 2

## 2012-12-15 MED ORDER — FENTANYL CITRATE 0.05 MG/ML IJ SOLN
25.0000 ug | INTRAMUSCULAR | Status: DC | PRN
  Administered 2012-12-15 (×3): 50 ug via INTRAVENOUS

## 2012-12-15 MED ORDER — EPHEDRINE SULFATE 50 MG/ML IJ SOLN
INTRAMUSCULAR | Status: DC | PRN
  Administered 2012-12-15: 10 mg via INTRAVENOUS

## 2012-12-15 MED ORDER — HYDROCODONE-ACETAMINOPHEN 5-325 MG PO TABS
1.0000 | ORAL_TABLET | ORAL | Status: DC | PRN
  Administered 2012-12-15: 1 via ORAL
  Filled 2012-12-15: qty 1

## 2012-12-15 MED ORDER — LIDOCAINE HCL 2 % EX GEL
CUTANEOUS | Status: AC
  Filled 2012-12-15: qty 10

## 2012-12-15 MED ORDER — IOHEXOL 300 MG/ML  SOLN
INTRAMUSCULAR | Status: DC | PRN
  Administered 2012-12-15: 10 mL via INTRAVENOUS

## 2012-12-15 MED ORDER — PROPOFOL 10 MG/ML IV BOLUS
INTRAVENOUS | Status: DC | PRN
  Administered 2012-12-15: 150 mg via INTRAVENOUS

## 2012-12-15 MED ORDER — MIDAZOLAM HCL 2 MG/2ML IJ SOLN
INTRAMUSCULAR | Status: AC
  Filled 2012-12-15: qty 2

## 2012-12-15 MED ORDER — DEXAMETHASONE SODIUM PHOSPHATE 4 MG/ML IJ SOLN
INTRAMUSCULAR | Status: DC | PRN
  Administered 2012-12-15: 10 mg via INTRAVENOUS

## 2012-12-15 MED ORDER — HYDROCODONE-ACETAMINOPHEN 5-325 MG PO TABS: 1.0000 | ORAL_TABLET | Freq: Four times a day (QID) | ORAL | Status: DC | PRN

## 2012-12-15 MED ORDER — PROPOFOL 10 MG/ML IV BOLUS
INTRAVENOUS | Status: AC
  Filled 2012-12-15: qty 20

## 2012-12-15 MED ORDER — FENTANYL CITRATE 0.05 MG/ML IJ SOLN
INTRAMUSCULAR | Status: AC
  Filled 2012-12-15: qty 5

## 2012-12-15 MED ORDER — ONDANSETRON HCL 4 MG/2ML IJ SOLN
INTRAMUSCULAR | Status: AC
  Filled 2012-12-15: qty 2

## 2012-12-15 SURGICAL SUPPLY — 18 items
APL SKNCLS STERI-STRIP NONHPOA (GAUZE/BANDAGES/DRESSINGS) ×1
BAG URO CATCHER STRL LF (DRAPE) ×2 IMPLANT
BENZOIN TINCTURE PRP APPL 2/3 (GAUZE/BANDAGES/DRESSINGS) ×1 IMPLANT
CATH URET 5FR 28IN CONE TIP (BALLOONS)
CATH URET 5FR 28IN OPEN ENDED (CATHETERS) ×2 IMPLANT
CATH URET 5FR 70CM CONE TIP (BALLOONS) ×1 IMPLANT
DRAPE CAMERA CLOSED 9X96 (DRAPES) ×2 IMPLANT
DRESSING TELFA 8X3 (GAUZE/BANDAGES/DRESSINGS) ×1 IMPLANT
DRSG TEGADERM 2-3/8X2-3/4 SM (GAUZE/BANDAGES/DRESSINGS) ×1 IMPLANT
GLOVE BIOGEL M STRL SZ7.5 (GLOVE) ×2 IMPLANT
GOWN STRL REIN XL XLG (GOWN DISPOSABLE) ×2 IMPLANT
GUIDEWIRE STR DUAL SENSOR (WIRE) ×3 IMPLANT
MANIFOLD NEPTUNE II (INSTRUMENTS) ×2 IMPLANT
NEEDLE HYPO 22GX1.5 SAFETY (NEEDLE) ×1 IMPLANT
PACK CYSTO (CUSTOM PROCEDURE TRAY) ×2 IMPLANT
STENT CONTOUR 6FRX26X.038 (STENTS) ×1 IMPLANT
TUBING CONNECTING 10 (TUBING) ×2 IMPLANT
WIRE COONS/BENSON .038X145CM (WIRE) IMPLANT

## 2012-12-15 NOTE — Transfer of Care (Signed)
Immediate Anesthesia Transfer of Care Note  Patient: Angel Cooper  Procedure(s) Performed: Procedure(s): CYSTOSCOPY WITH RIGHT RETROGRADE PYELOGRAM, POSSIBLE RIGHT URETEROSCOPY AND STENT PLACEMENT AND REMOVAL OF LEFT STENT (Right)  Patient Location: PACU  Anesthesia Type:General  Level of Consciousness: Patient easily awoken, sedated, comfortable, cooperative, following commands, responds to stimulation.   Airway & Oxygen Therapy: Patient spontaneously breathing, ventilating well, oxygen via simple oxygen mask.  Post-op Assessment: Report given to PACU RN, vital signs reviewed and stable, moving all extremities.   Post vital signs: Reviewed and stable.  Complications: No apparent anesthesia complications

## 2012-12-15 NOTE — Anesthesia Preprocedure Evaluation (Signed)
Anesthesia Evaluation  Patient identified by MRN, date of birth, ID band Patient awake    Reviewed: Allergy & Precautions, H&P , NPO status , Patient's Chart, lab work & pertinent test results  Airway Mallampati: II TM Distance: >3 FB Neck ROM: Full    Dental  (+) Dental Advisory Given   Pulmonary shortness of breath, former smoker,  breath sounds clear to auscultation        Cardiovascular hypertension, Pt. on medications + CAD, + Past MI and + CABG Rhythm:Regular Rate:Normal     Neuro/Psych negative neurological ROS  negative psych ROS   GI/Hepatic Neg liver ROS, hiatal hernia, GERD-  Medicated,  Endo/Other  negative endocrine ROS  Renal/GU negative Renal ROS     Musculoskeletal negative musculoskeletal ROS (+)   Abdominal   Peds  Hematology negative hematology ROS (+)   Anesthesia Other Findings   Reproductive/Obstetrics negative OB ROS                           Anesthesia Physical  Anesthesia Plan  ASA: III  Anesthesia Plan: General   Post-op Pain Management:    Induction: Intravenous  Airway Management Planned: LMA  Additional Equipment:   Intra-op Plan:   Post-operative Plan: Extubation in OR  Informed Consent: I have reviewed the patients History and Physical, chart, labs and discussed the procedure including the risks, benefits and alternatives for the proposed anesthesia with the patient or authorized representative who has indicated his/her understanding and acceptance.   Dental advisory given  Plan Discussed with: CRNA  Anesthesia Plan Comments:         Anesthesia Quick Evaluation

## 2012-12-15 NOTE — Anesthesia Postprocedure Evaluation (Signed)
  Anesthesia Post-op Note  Patient: Angel Cooper  Procedure(s) Performed: Procedure(s) (LRB): CYSTOSCOPY WITH RIGHT RETROGRADE PYELOGRAM, POSSIBLE RIGHT URETEROSCOPY AND STENT PLACEMENT AND REMOVAL OF LEFT STENT (Right)  Patient Location: PACU  Anesthesia Type: General  Level of Consciousness: awake and alert   Airway and Oxygen Therapy: Patient Spontanous Breathing  Post-op Pain: mild  Post-op Assessment: Post-op Vital signs reviewed, Patient's Cardiovascular Status Stable, Respiratory Function Stable, Patent Airway and No signs of Nausea or vomiting  Last Vitals:  Filed Vitals:   12/15/12 1725  BP: 170/81  Pulse: 88  Temp:   Resp: 18    Post-op Vital Signs: stable   Complications: No apparent anesthesia complications

## 2012-12-15 NOTE — Op Note (Signed)
Preoperative diagnosis: right renal pelvis mass  Postoperative diagnosis: same  Procedure:  1. Cystoscopy 2. Left stent removal 3. right ureteral washing/cytology 4. Right ureteroscopy, renal pelvis biopsy 5. right 66F x 26 ureteral stent placement  6. right retrograde pyelography with interpretation  Surgeon: Crist Fat, MD  Anesthesia: General  Complications: None  Intraoperative findings: right retrograde pyelography demonstrated a filling defect within the right renal pelvis consistent with CT scan findings.  EBL: Minimal  Specimens: 1. Right ureteral washing 2. Right renal pelvis mass biopsy  Indication: Angel Cooper is a 79 y.o.  patient with CT scan findings consistent with a right renal pelvis mass.  After reviewing the management options for treatment, the patient elected to proceed with the above surgical procedure(s). We have discussed the potential benefits and risks of the procedure, side effects of the proposed treatment, the likelihood of the patient achieving the goals of the procedure, and any potential problems that might occur during the procedure or recuperation. Informed consent has been obtained.  Description of procedure:  The patient was taken to the operating room and general anesthesia was induced.  The patient was placed in the dorsal lithotomy position, prepped and draped in the usual sterile fashion, and preoperative antibiotics were administered. A preoperative time-out was performed.   Stent tether was then pulled to the urethral meatus and a 0.38 sensor wire was passed through the stent and up to the left renal pelvis. The stent was then removed over the wire and a 5 [place] open-ended ureteral Pollock and then passed over the wire. The wire was then removed and contrast was injected into the left collecting system. There was no transition and brisk emptying of the left collecting system. As such the catheter was removed.  We then turned  our attention to the patient's right ureteral orifice and passed a 0.38 sensor wire up into the right ureter. I then passed an open-ended 5 French ureteral catheter over the wire and into the right collecting system. I then gently injected 10 cc of saline into the catheter and performed a gentle barbotaged and then draped the urine into a specimen cup which was sent for cytology. We then pulled the open-ended catheter down to the urethral meatus and performed a retrograde pyelogram with the above findings. We then reinserted the 0.38 sensor wire into the right ureter and into the right renal pelvis. We emptied the patient's bladder and removed the cystoscope.  Then we use a rigid ureteroscope gently passed into the patient's urethra and into the bladder. I need a second 0.38 sensor wire to get the tip of the ureteroscope into the right ureteral orifice. Once into the orifice I was able to get the scope into the proximal ureter, but not into the renal pelvis. I then passed a second wire through the ureteroscope and backed out slowly. We then lysed the flexible scope over the wire and using this a Seldinger technique pass the flexible scope into the right renal pelvis. Just within the renal pelvis there was a narrow segment that was difficult to pass but with gentle pressure was able to get through this. Once through this then fully into the renal pelvis I was able to appreciate a significant volume of papillary lesions consistent with transitional cell carcinoma. I then took 3 biopsies using the parotid ureteral biopsy forceps which were sent to pathology as permanent specimens. The flexible ureteroscope was then removed.  The wire was then backloaded through the cystoscope and  the cystoscope gently passed to the patient's urethra and into the bladder under visual guidance. We then placed a 6 French 26 cm double-J ureteral stent over the wire and into the right renal pelvis under fluoroscopic guidance. Once a  nice curl was noted in the right renal pelvis the scope was pulled back to the bladder neck and the remaining stent was pushed into the bladder. The wire was then removed the bladder emptied and the tether secured to the dorsum of the patient's penis.  The patient tolerated procedure well there no perioperative complications. All laps needles and instruments had been accounted for. The patient was subsequently extubated and returned back in excellent condition.  Disposition: Patient will followup in one week for stent removal and biopsy results.

## 2012-12-15 NOTE — Preoperative (Signed)
Beta Blockers   Reason not to administer Beta Blockers:Not Applicable, not on home BB 

## 2012-12-15 NOTE — H&P (Signed)
  Patient presents with a right renal pelvis mass seen on CT scan.  He is scheduled for diagnostic cytoscopy, right ureteral cytology, right retrograde pyelogram, right ureteroscopy and possible biospy, right ureteral stent placement.  Patient is without complaints.  Filed Vitals:   12/15/12 1123 12/15/12 1152 12/15/12 1208  BP: 195/68  161/72  Pulse: 43 80   Temp: 98 F (36.7 C)    TempSrc: Oral    Resp: 16    SpO2: 100%    NAD RRR CTA-B Abdomen is soft Ext symmetric  CT Scan: IMPRESSION:  1. Abnormal soft tissue within the right renal pelvis collecting  system and proximal right ureter worrisome for edema or more likely  tumor, possibly transitional cell carcinoma. Correlate clinically.  No hydronephrosis.  2. Stable moderately large hiatal hernia.  3. 5 mm anterolisthesis of L5 on S1 with bilateral pars defects at  L5.  4. Rectosigmoid colon diverticula.  5. 6 mm pleural-based nodule in the posterior medial right lower  lobe. Cannot exclude a metastatic lesion.  Imp: right renal pelvis filling defect Plan: proceed with the schedule procedure.

## 2012-12-16 ENCOUNTER — Encounter (HOSPITAL_COMMUNITY): Payer: Self-pay | Admitting: Urology

## 2013-01-06 ENCOUNTER — Other Ambulatory Visit (HOSPITAL_COMMUNITY): Payer: Self-pay | Admitting: Urology

## 2013-01-06 DIAGNOSIS — C689 Malignant neoplasm of urinary organ, unspecified: Secondary | ICD-10-CM

## 2013-01-07 ENCOUNTER — Other Ambulatory Visit: Payer: Self-pay | Admitting: Urology

## 2013-01-07 NOTE — Progress Notes (Signed)
Surgery scheduled for 01/24/13.  Need orders in EPIC.  Thank You.

## 2013-01-13 ENCOUNTER — Encounter (HOSPITAL_COMMUNITY): Payer: Self-pay

## 2013-01-13 ENCOUNTER — Ambulatory Visit (HOSPITAL_COMMUNITY)
Admission: RE | Admit: 2013-01-13 | Discharge: 2013-01-13 | Disposition: A | Discharge status: Home / Self Care | Payer: Medicare Other | Source: Ambulatory Visit | Attending: Urology | Admitting: Urology

## 2013-01-13 DIAGNOSIS — K5730 Diverticulosis of large intestine without perforation or abscess without bleeding: Secondary | ICD-10-CM | POA: Insufficient documentation

## 2013-01-13 DIAGNOSIS — R911 Solitary pulmonary nodule: Secondary | ICD-10-CM | POA: Insufficient documentation

## 2013-01-13 DIAGNOSIS — C801 Malignant (primary) neoplasm, unspecified: Secondary | ICD-10-CM | POA: Insufficient documentation

## 2013-01-13 DIAGNOSIS — N1330 Unspecified hydronephrosis: Secondary | ICD-10-CM | POA: Insufficient documentation

## 2013-01-13 DIAGNOSIS — N289 Disorder of kidney and ureter, unspecified: Secondary | ICD-10-CM | POA: Insufficient documentation

## 2013-01-13 DIAGNOSIS — K449 Diaphragmatic hernia without obstruction or gangrene: Secondary | ICD-10-CM | POA: Insufficient documentation

## 2013-01-13 DIAGNOSIS — Z951 Presence of aortocoronary bypass graft: Secondary | ICD-10-CM | POA: Insufficient documentation

## 2013-01-13 DIAGNOSIS — C689 Malignant neoplasm of urinary organ, unspecified: Secondary | ICD-10-CM

## 2013-01-13 MED ORDER — FLUDEOXYGLUCOSE F - 18 (FDG) INJECTION
17.4000 | Freq: Once | INTRAVENOUS | Status: AC | PRN
  Administered 2013-01-13: 17.4 via INTRAVENOUS

## 2013-01-14 ENCOUNTER — Encounter (HOSPITAL_COMMUNITY): Payer: Self-pay | Admitting: Pharmacy Technician

## 2013-01-17 NOTE — Progress Notes (Signed)
Chest x ray, EkG 11/14 EPIC

## 2013-01-17 NOTE — Progress Notes (Signed)
LOV Dr Valentina Lucks 01/10/13 chart

## 2013-01-17 NOTE — Patient Instructions (Addendum)
20 Angel Cooper  01/17/2013   Your procedure is scheduled on:  01/24/13  MONDAY  Report to [facility] at   0930    AM.  Call this number if you have problems the morning of surgery: [phone]       Remember: BOWEL PREP AS PER OFFICE  Do not TAKE ANYTHING BY MOUTH :After Midnight. Sunday NIGHT   Take these medicines the morning of surgery with A SIP OF WATER:  NEXIUM                   May take OXYCODONE, TYLENOL  IF NEEDED   .  Contacts, dentures or partial plates can not be worn to surgery  Leave suitcase in the car. After surgery it may be brought to your room.  For patients admitted to the hospital, checkout time is 11:00 AM day of  discharge.             SPECIAL INSTRUCTIONS- SEE [place] PREPARING FOR SURGERY INSTRUCTION SHEET-     DO NOT WEAR JEWELRY, LOTIONS, POWDERS, OR PERFUMES.  WOMEN-- DO NOT SHAVE LEGS OR UNDERARMS FOR 12 HOURS BEFORE SHOWERS. MEN MAY SHAVE FACE.  Patients discharged the day of surgery will not be allowed to drive home. IF going home the day of surgery, you must have a driver and someone to stay with you for the first 24 hours  Name and phone number of your driver:    admission                                                                    Please read over the following fact sheets that you were given:  Blood Transfusion Sheet  Information                                                                                   Angel Cooper  PST [phone]  [phone]                 FAILURE TO FOLLOW THESE INSTRUCTIONS MAY RESULT IN  CANCELLATION   OF YOUR SURGERY                                                  Patient Signature _____________________________

## 2013-01-18 ENCOUNTER — Encounter (HOSPITAL_COMMUNITY)
Admission: RE | Admit: 2013-01-18 | Discharge: 2013-01-18 | Disposition: A | Discharge status: Home / Self Care | Payer: Medicare Other | Source: Ambulatory Visit | Attending: Urology | Admitting: Urology

## 2013-01-18 ENCOUNTER — Encounter (HOSPITAL_COMMUNITY): Payer: Self-pay

## 2013-01-18 DIAGNOSIS — Z01812 Encounter for preprocedural laboratory examination: Secondary | ICD-10-CM | POA: Insufficient documentation

## 2013-01-18 LAB — CBC
HCT: 36.3 % — ABNORMAL LOW (ref 39.0–52.0)
Hemoglobin: 11.5 g/dL — ABNORMAL LOW (ref 13.0–17.0)
MCV: 92.1 fL (ref 78.0–100.0)
RBC: 3.94 MIL/uL — ABNORMAL LOW (ref 4.22–5.81)
RDW: 13.8 % (ref 11.5–15.5)
WBC: 13.3 10*3/uL — ABNORMAL HIGH (ref 4.0–10.5)

## 2013-01-18 LAB — ABO/RH: ABO/RH(D): B NEG

## 2013-01-18 LAB — COMPREHENSIVE METABOLIC PANEL
ALT: 7 U/L (ref 0–53)
Alkaline Phosphatase: 70 U/L (ref 39–117)
BUN: 20 mg/dL (ref 6–23)
CO2: 28 mEq/L (ref 19–32)
Chloride: 97 mEq/L (ref 96–112)
Creatinine, Ser: 1.52 mg/dL — ABNORMAL HIGH (ref 0.50–1.35)
GFR calc non Af Amer: 42 mL/min — ABNORMAL LOW (ref >90)
Total Bilirubin: 0.2 mg/dL — ABNORMAL LOW (ref 0.3–1.2)

## 2013-01-18 NOTE — Progress Notes (Signed)
01/18/13 0853  OBSTRUCTIVE SLEEP APNEA  Have you ever been diagnosed with sleep apnea through a sleep study? No  Do you snore loudly (loud enough to be heard through closed doors)?  0  Do you often feel tired, fatigued, or sleepy during the daytime? 1  Has anyone observed you stop breathing during your sleep? 0  Do you have, or are you being treated for high blood pressure? 1  BMI more than 35 kg/m2? 0  Age over 77 years old? 1  Neck circumference greater than 40 cm/18 inches? 0  Gender: 1  Obstructive Sleep Apnea Score 4  Score 4 or greater  Results sent to PCP

## 2013-01-18 NOTE — Progress Notes (Signed)
Faxed CBC, BMET to Dr Marlou Porch by [facility], left voice mail with C Little at [facility] that labs faxed for review today

## 2013-01-23 NOTE — Anesthesia Preprocedure Evaluation (Addendum)
Anesthesia Evaluation  Patient identified by MRN, date of birth, ID band Patient awake    Reviewed: Allergy & Precautions, H&P , NPO status , Patient's Chart, lab work & pertinent test results  Airway Mallampati: II TM Distance: >3 FB Neck ROM: Full    Dental  (+) Dental Advisory Given   Pulmonary shortness of breath and with exertion, former smoker,  breath sounds clear to auscultation        Cardiovascular hypertension, Pt. on medications + CAD, + Past MI and + CABG Rhythm:Regular Rate:Normal     Neuro/Psych negative neurological ROS  negative psych ROS   GI/Hepatic Neg liver ROS, hiatal hernia, GERD-  Medicated,  Endo/Other  negative endocrine ROS  Renal/GU negative Renal ROS     Musculoskeletal negative musculoskeletal ROS (+)   Abdominal   Peds  Hematology negative hematology ROS (+)   Anesthesia Other Findings   Reproductive/Obstetrics negative OB ROS                          Anesthesia Physical  Anesthesia Plan  ASA: III  Anesthesia Plan: General   Post-op Pain Management:    Induction: Intravenous  Airway Management Planned: Oral ETT  Additional Equipment: Arterial line  Intra-op Plan:   Post-operative Plan: Extubation in OR  Informed Consent: I have reviewed the patients History and Physical, chart, labs and discussed the procedure including the risks, benefits and alternatives for the proposed anesthesia with the patient or authorized representative who has indicated his/her understanding and acceptance.   Dental advisory given  Plan Discussed with: CRNA  Anesthesia Plan Comments: (2x PIV)        Anesthesia Quick Evaluation

## 2013-01-24 ENCOUNTER — Inpatient Hospital Stay (HOSPITAL_COMMUNITY)
Admission: RE | Admit: 2013-01-24 | Discharge: 2013-01-30 | DRG: 657 | Disposition: A | Discharge status: Home Health | Payer: Medicare Other | Source: Ambulatory Visit | Attending: Urology | Admitting: Urology

## 2013-01-24 ENCOUNTER — Encounter (HOSPITAL_COMMUNITY): Payer: Self-pay | Admitting: Certified Registered Nurse Anesthetist

## 2013-01-24 ENCOUNTER — Encounter (HOSPITAL_COMMUNITY): Payer: Medicare Other | Admitting: Anesthesiology

## 2013-01-24 ENCOUNTER — Inpatient Hospital Stay (HOSPITAL_COMMUNITY): Payer: Medicare Other

## 2013-01-24 ENCOUNTER — Encounter (HOSPITAL_COMMUNITY)
Admission: RE | Disposition: A | Discharge status: Home Health | Payer: Self-pay | Source: Ambulatory Visit | Attending: Urology

## 2013-01-24 ENCOUNTER — Inpatient Hospital Stay (HOSPITAL_COMMUNITY): Payer: Medicare Other | Admitting: Anesthesiology

## 2013-01-24 DIAGNOSIS — N1832 Chronic kidney disease, stage 3b: Secondary | ICD-10-CM

## 2013-01-24 DIAGNOSIS — M25569 Pain in unspecified knee: Secondary | ICD-10-CM | POA: Diagnosis present

## 2013-01-24 DIAGNOSIS — C659 Malignant neoplasm of unspecified renal pelvis: Principal | ICD-10-CM | POA: Diagnosis present

## 2013-01-24 DIAGNOSIS — R630 Anorexia: Secondary | ICD-10-CM | POA: Diagnosis present

## 2013-01-24 DIAGNOSIS — I129 Hypertensive chronic kidney disease with stage 1 through stage 4 chronic kidney disease, or unspecified chronic kidney disease: Secondary | ICD-10-CM | POA: Diagnosis present

## 2013-01-24 DIAGNOSIS — F411 Generalized anxiety disorder: Secondary | ICD-10-CM | POA: Diagnosis present

## 2013-01-24 DIAGNOSIS — K219 Gastro-esophageal reflux disease without esophagitis: Secondary | ICD-10-CM | POA: Diagnosis present

## 2013-01-24 DIAGNOSIS — Z823 Family history of stroke: Secondary | ICD-10-CM

## 2013-01-24 DIAGNOSIS — K5900 Constipation, unspecified: Secondary | ICD-10-CM | POA: Diagnosis present

## 2013-01-24 DIAGNOSIS — R1033 Periumbilical pain: Secondary | ICD-10-CM | POA: Diagnosis present

## 2013-01-24 DIAGNOSIS — C669 Malignant neoplasm of unspecified ureter: Secondary | ICD-10-CM | POA: Diagnosis present

## 2013-01-24 DIAGNOSIS — Z886 Allergy status to analgesic agent status: Secondary | ICD-10-CM

## 2013-01-24 DIAGNOSIS — I10 Essential (primary) hypertension: Secondary | ICD-10-CM

## 2013-01-24 DIAGNOSIS — R634 Abnormal weight loss: Secondary | ICD-10-CM | POA: Diagnosis present

## 2013-01-24 DIAGNOSIS — D72829 Elevated white blood cell count, unspecified: Secondary | ICD-10-CM | POA: Diagnosis present

## 2013-01-24 DIAGNOSIS — I252 Old myocardial infarction: Secondary | ICD-10-CM

## 2013-01-24 DIAGNOSIS — K449 Diaphragmatic hernia without obstruction or gangrene: Secondary | ICD-10-CM | POA: Diagnosis present

## 2013-01-24 DIAGNOSIS — D638 Anemia in other chronic diseases classified elsewhere: Secondary | ICD-10-CM | POA: Diagnosis present

## 2013-01-24 DIAGNOSIS — N183 Chronic kidney disease, stage 3 unspecified: Secondary | ICD-10-CM | POA: Diagnosis present

## 2013-01-24 DIAGNOSIS — N1330 Unspecified hydronephrosis: Secondary | ICD-10-CM | POA: Diagnosis present

## 2013-01-24 DIAGNOSIS — I2510 Atherosclerotic heart disease of native coronary artery without angina pectoris: Secondary | ICD-10-CM

## 2013-01-24 DIAGNOSIS — E7800 Pure hypercholesterolemia, unspecified: Secondary | ICD-10-CM | POA: Diagnosis present

## 2013-01-24 DIAGNOSIS — IMO0002 Reserved for concepts with insufficient information to code with codable children: Secondary | ICD-10-CM

## 2013-01-24 DIAGNOSIS — G8918 Other acute postprocedural pain: Secondary | ICD-10-CM | POA: Diagnosis not present

## 2013-01-24 DIAGNOSIS — I4891 Unspecified atrial fibrillation: Secondary | ICD-10-CM

## 2013-01-24 DIAGNOSIS — Z01812 Encounter for preprocedural laboratory examination: Secondary | ICD-10-CM

## 2013-01-24 DIAGNOSIS — M549 Dorsalgia, unspecified: Secondary | ICD-10-CM | POA: Diagnosis present

## 2013-01-24 DIAGNOSIS — M129 Arthropathy, unspecified: Secondary | ICD-10-CM | POA: Diagnosis present

## 2013-01-24 DIAGNOSIS — Z8711 Personal history of peptic ulcer disease: Secondary | ICD-10-CM

## 2013-01-24 DIAGNOSIS — Z9849 Cataract extraction status, unspecified eye: Secondary | ICD-10-CM

## 2013-01-24 DIAGNOSIS — Z951 Presence of aortocoronary bypass graft: Secondary | ICD-10-CM

## 2013-01-24 DIAGNOSIS — C661 Malignant neoplasm of right ureter: Secondary | ICD-10-CM

## 2013-01-24 DIAGNOSIS — I25810 Atherosclerosis of coronary artery bypass graft(s) without angina pectoris: Secondary | ICD-10-CM

## 2013-01-24 DIAGNOSIS — Z87891 Personal history of nicotine dependence: Secondary | ICD-10-CM

## 2013-01-24 HISTORY — PX: CYSTOSCOPY WITH STENT PLACEMENT: SHX5790

## 2013-01-24 HISTORY — PX: ROBOT ASSITED LAPAROSCOPIC NEPHROURETERECTOMY: SHX6077

## 2013-01-24 LAB — BASIC METABOLIC PANEL
CO2: 22 mEq/L (ref 19–32)
Chloride: 96 mEq/L (ref 96–112)
Creatinine, Ser: 1.25 mg/dL (ref 0.50–1.35)
GFR calc Af Amer: 61 mL/min — ABNORMAL LOW (ref >90)
GFR calc non Af Amer: 53 mL/min — ABNORMAL LOW (ref >90)
Potassium: 5 mEq/L (ref 3.5–5.1)
Sodium: 130 mEq/L — ABNORMAL LOW (ref 135–145)

## 2013-01-24 LAB — CBC
MCV: 90 fL (ref 78.0–100.0)
Platelets: 574 10*3/uL — ABNORMAL HIGH (ref 150–400)
RBC: 3.8 MIL/uL — ABNORMAL LOW (ref 4.22–5.81)
WBC: 23.7 10*3/uL — ABNORMAL HIGH (ref 4.0–10.5)

## 2013-01-24 LAB — TYPE AND SCREEN
ABO/RH(D): B NEG
Antibody Screen: NEGATIVE

## 2013-01-24 SURGERY — ROBOT ASSITED LAPAROSCOPIC NEPHROURETERECTOMY
Anesthesia: General | Site: Flank | Laterality: Right

## 2013-01-24 MED ORDER — ESMOLOL HCL 10 MG/ML IV SOLN
INTRAVENOUS | Status: DC | PRN
  Administered 2013-01-24 (×2): 20 mg via INTRAVENOUS
  Administered 2013-01-24: 30 mg via INTRAVENOUS
  Administered 2013-01-24: 3 mg via INTRAVENOUS
  Administered 2013-01-24: 30 mg via INTRAVENOUS
  Administered 2013-01-24: 20 mg via INTRAVENOUS

## 2013-01-24 MED ORDER — ACETAMINOPHEN 500 MG PO TABS
1000.0000 mg | ORAL_TABLET | Freq: Four times a day (QID) | ORAL | Status: DC
  Administered 2013-01-24 – 2013-01-30 (×17): 1000 mg via ORAL
  Filled 2013-01-24 (×30): qty 2

## 2013-01-24 MED ORDER — HYDROMORPHONE HCL PF 2 MG/ML IJ SOLN
INTRAMUSCULAR | Status: AC
  Filled 2013-01-24: qty 1

## 2013-01-24 MED ORDER — HYDROMORPHONE HCL PF 1 MG/ML IJ SOLN
INTRAMUSCULAR | Status: AC
  Filled 2013-01-24: qty 1

## 2013-01-24 MED ORDER — PANTOPRAZOLE SODIUM 40 MG PO TBEC
40.0000 mg | DELAYED_RELEASE_TABLET | Freq: Every day | ORAL | Status: DC
  Filled 2013-01-24: qty 1

## 2013-01-24 MED ORDER — ESMOLOL HCL 10 MG/ML IV SOLN
INTRAVENOUS | Status: AC
  Filled 2013-01-24: qty 10

## 2013-01-24 MED ORDER — FENTANYL CITRATE 0.05 MG/ML IJ SOLN
INTRAMUSCULAR | Status: DC | PRN
  Administered 2013-01-24: 50 ug via INTRAVENOUS
  Administered 2013-01-24: 100 ug via INTRAVENOUS
  Administered 2013-01-24 (×4): 50 ug via INTRAVENOUS

## 2013-01-24 MED ORDER — GLYCOPYRROLATE 0.2 MG/ML IJ SOLN
INTRAMUSCULAR | Status: AC
  Filled 2013-01-24: qty 3

## 2013-01-24 MED ORDER — ROCURONIUM BROMIDE 100 MG/10ML IV SOLN
INTRAVENOUS | Status: DC | PRN
  Administered 2013-01-24: 10 mg via INTRAVENOUS
  Administered 2013-01-24: 50 mg via INTRAVENOUS
  Administered 2013-01-24: 20 mg via INTRAVENOUS
  Administered 2013-01-24: 5 mg via INTRAVENOUS
  Administered 2013-01-24: 20 mg via INTRAVENOUS

## 2013-01-24 MED ORDER — CIPROFLOXACIN IN D5W 400 MG/200ML IV SOLN
INTRAVENOUS | Status: AC
  Filled 2013-01-24: qty 200

## 2013-01-24 MED ORDER — LABETALOL HCL 5 MG/ML IV SOLN
INTRAVENOUS | Status: AC
  Filled 2013-01-24: qty 4

## 2013-01-24 MED ORDER — ROPIVACAINE HCL 5 MG/ML IJ SOLN
INTRAMUSCULAR | Status: DC | PRN
  Administered 2013-01-24: 40 mL

## 2013-01-24 MED ORDER — LACTATED RINGERS IV SOLN
INTRAVENOUS | Status: DC | PRN
  Administered 2013-01-24: 12:00:00 via INTRAVENOUS

## 2013-01-24 MED ORDER — HYDROMORPHONE HCL PF 1 MG/ML IJ SOLN
0.2500 mg | INTRAMUSCULAR | Status: DC | PRN
  Administered 2013-01-24 (×2): 0.5 mg via INTRAVENOUS

## 2013-01-24 MED ORDER — DEXTROSE-NACL 5-0.9 % IV SOLN
INTRAVENOUS | Status: DC
  Administered 2013-01-24: 22:00:00 via INTRAVENOUS
  Administered 2013-01-25: 08:00:00 100 mL/h via INTRAVENOUS
  Administered 2013-01-26: 09:00:00 via INTRAVENOUS

## 2013-01-24 MED ORDER — CEFAZOLIN SODIUM-DEXTROSE 2-3 GM-% IV SOLR
INTRAVENOUS | Status: AC
  Filled 2013-01-24: qty 50

## 2013-01-24 MED ORDER — LIDOCAINE HCL (CARDIAC) 20 MG/ML IV SOLN
INTRAVENOUS | Status: DC | PRN
  Administered 2013-01-24: 80 mg via INTRAVENOUS

## 2013-01-24 MED ORDER — NEOSTIGMINE METHYLSULFATE 1 MG/ML IJ SOLN
INTRAMUSCULAR | Status: DC | PRN
  Administered 2013-01-24: 5 mg via INTRAVENOUS

## 2013-01-24 MED ORDER — SODIUM CHLORIDE 0.9 % IJ SOLN
INTRAMUSCULAR | Status: AC
  Filled 2013-01-24: qty 20

## 2013-01-24 MED ORDER — BACITRACIN-NEOMYCIN-POLYMYXIN 400-5-5000 EX OINT: 1.0000 "application " | TOPICAL_OINTMENT | Freq: Three times a day (TID) | CUTANEOUS | Status: DC | PRN

## 2013-01-24 MED ORDER — FENTANYL CITRATE 0.05 MG/ML IJ SOLN
INTRAMUSCULAR | Status: AC
  Filled 2013-01-24: qty 2

## 2013-01-24 MED ORDER — SODIUM CHLORIDE 0.9 % IR SOLN
Status: DC | PRN
  Administered 2013-01-24: 1000 mL

## 2013-01-24 MED ORDER — OXYCODONE HCL 5 MG PO TABS: 5.0000 mg | ORAL_TABLET | Freq: Once | ORAL | Status: DC | PRN

## 2013-01-24 MED ORDER — BUPIVACAINE-EPINEPHRINE PF 0.25-1:200000 % IJ SOLN
INTRAMUSCULAR | Status: AC
  Filled 2013-01-24: qty 30

## 2013-01-24 MED ORDER — ZOLPIDEM TARTRATE 5 MG PO TABS
5.0000 mg | ORAL_TABLET | Freq: Every evening | ORAL | Status: DC | PRN
  Administered 2013-01-26 – 2013-01-29 (×3): 5 mg via ORAL
  Filled 2013-01-24 (×3): qty 1

## 2013-01-24 MED ORDER — HYDROMORPHONE HCL PF 1 MG/ML IJ SOLN
0.5000 mg | INTRAMUSCULAR | Status: DC | PRN
  Administered 2013-01-24: 0.5 mg via INTRAVENOUS
  Administered 2013-01-25 – 2013-01-26 (×8): 1 mg via INTRAVENOUS
  Administered 2013-01-26: 0.5 mg via INTRAVENOUS
  Administered 2013-01-27: 1 mg via INTRAVENOUS
  Filled 2013-01-24 (×11): qty 1

## 2013-01-24 MED ORDER — CIPROFLOXACIN IN D5W 200 MG/100ML IV SOLN
200.0000 mg | Freq: Two times a day (BID) | INTRAVENOUS | Status: AC
  Administered 2013-01-24 – 2013-01-25 (×2): 200 mg via INTRAVENOUS
  Filled 2013-01-24 (×2): qty 100

## 2013-01-24 MED ORDER — OXYCODONE HCL 5 MG/5ML PO SOLN
5.0000 mg | Freq: Once | ORAL | Status: DC | PRN
  Filled 2013-01-24: qty 5

## 2013-01-24 MED ORDER — OXYCODONE HCL 5 MG PO TABS
5.0000 mg | ORAL_TABLET | ORAL | Status: DC | PRN
  Administered 2013-01-25: 5 mg via ORAL
  Administered 2013-01-25: 10 mg via ORAL
  Administered 2013-01-25: 5 mg via ORAL
  Administered 2013-01-26 (×3): 10 mg via ORAL
  Administered 2013-01-27: 5 mg via ORAL
  Administered 2013-01-27: 10 mg via ORAL
  Filled 2013-01-24 (×3): qty 2
  Filled 2013-01-24: qty 1
  Filled 2013-01-24 (×3): qty 2
  Filled 2013-01-24: qty 1

## 2013-01-24 MED ORDER — PROPOFOL 10 MG/ML IV BOLUS
INTRAVENOUS | Status: DC | PRN
  Administered 2013-01-24: 130 mg via INTRAVENOUS

## 2013-01-24 MED ORDER — ONDANSETRON HCL 4 MG/2ML IJ SOLN
INTRAMUSCULAR | Status: DC | PRN
  Administered 2013-01-24: 4 mg via INTRAVENOUS

## 2013-01-24 MED ORDER — SODIUM CHLORIDE 0.9 % IR SOLN
Status: DC | PRN
  Administered 2013-01-24: 3000 mL

## 2013-01-24 MED ORDER — CIPROFLOXACIN IN D5W 400 MG/200ML IV SOLN
400.0000 mg | INTRAVENOUS | Status: AC
  Administered 2013-01-24: 400 mg via INTRAVENOUS

## 2013-01-24 MED ORDER — MAGNESIUM HYDROXIDE 400 MG/5ML PO SUSP: 30.0000 mL | Freq: Every day | ORAL | Status: DC | PRN

## 2013-01-24 MED ORDER — OXYCODONE HCL 5 MG PO CAPS: 5.0000 mg | ORAL_CAPSULE | ORAL | Status: DC | PRN

## 2013-01-24 MED ORDER — GLYCOPYRROLATE 0.2 MG/ML IJ SOLN
INTRAMUSCULAR | Status: DC | PRN
  Administered 2013-01-24: 0.2 mg via INTRAVENOUS
  Administered 2013-01-24: 0.6 mg via INTRAVENOUS

## 2013-01-24 MED ORDER — MEPERIDINE HCL 50 MG/ML IJ SOLN: 6.2500 mg | INTRAMUSCULAR | Status: DC | PRN

## 2013-01-24 MED ORDER — ROCURONIUM BROMIDE 100 MG/10ML IV SOLN
INTRAVENOUS | Status: AC
  Filled 2013-01-24: qty 1

## 2013-01-24 MED ORDER — VITAMINS A & D EX OINT
TOPICAL_OINTMENT | CUTANEOUS | Status: AC
  Administered 2013-01-24: 1
  Filled 2013-01-24: qty 5

## 2013-01-24 MED ORDER — LABETALOL HCL 5 MG/ML IV SOLN
INTRAVENOUS | Status: DC | PRN
  Administered 2013-01-24 (×6): 5 mg via INTRAVENOUS

## 2013-01-24 MED ORDER — ONDANSETRON HCL 4 MG/2ML IJ SOLN
INTRAMUSCULAR | Status: AC
  Filled 2013-01-24: qty 2

## 2013-01-24 MED ORDER — FENTANYL CITRATE 0.05 MG/ML IJ SOLN
INTRAMUSCULAR | Status: AC
  Filled 2013-01-24: qty 5

## 2013-01-24 MED ORDER — LACTATED RINGERS IV SOLN
INTRAVENOUS | Status: DC | PRN
  Administered 2013-01-24 (×2): via INTRAVENOUS

## 2013-01-24 MED ORDER — NEOSTIGMINE METHYLSULFATE 1 MG/ML IJ SOLN
INTRAMUSCULAR | Status: AC
  Filled 2013-01-24: qty 10

## 2013-01-24 MED ORDER — CEFAZOLIN SODIUM-DEXTROSE 2-3 GM-% IV SOLR
2.0000 g | INTRAVENOUS | Status: AC
  Administered 2013-01-24: 2 g via INTRAVENOUS

## 2013-01-24 MED ORDER — ONDANSETRON HCL 4 MG/2ML IJ SOLN
4.0000 mg | INTRAMUSCULAR | Status: DC | PRN
  Administered 2013-01-27: 4 mg via INTRAVENOUS
  Filled 2013-01-24: qty 2

## 2013-01-24 MED ORDER — STERILE WATER FOR IRRIGATION IR SOLN
Status: DC | PRN
  Administered 2013-01-24: 3000 mL

## 2013-01-24 MED ORDER — HYDROMORPHONE HCL PF 1 MG/ML IJ SOLN
INTRAMUSCULAR | Status: DC | PRN
  Administered 2013-01-24 (×3): 1 mg via INTRAVENOUS

## 2013-01-24 MED ORDER — ROPIVACAINE HCL 5 MG/ML IJ SOLN
INTRAMUSCULAR | Status: AC
  Filled 2013-01-24: qty 30

## 2013-01-24 MED ORDER — PROMETHAZINE HCL 25 MG/ML IJ SOLN: 6.2500 mg | INTRAMUSCULAR | Status: DC | PRN

## 2013-01-24 MED ORDER — PROPOFOL 10 MG/ML IV BOLUS
INTRAVENOUS | Status: AC
  Filled 2013-01-24: qty 20

## 2013-01-24 MED ORDER — HEPARIN SODIUM (PORCINE) 5000 UNIT/ML IJ SOLN
5000.0000 [IU] | Freq: Two times a day (BID) | INTRAMUSCULAR | Status: DC
  Administered 2013-01-24 – 2013-01-27 (×6): 5000 [IU] via SUBCUTANEOUS
  Filled 2013-01-24 (×7): qty 1

## 2013-01-24 MED ORDER — DOCUSATE SODIUM 100 MG PO CAPS
100.0000 mg | ORAL_CAPSULE | Freq: Two times a day (BID) | ORAL | Status: DC
  Administered 2013-01-24 – 2013-01-30 (×11): 100 mg via ORAL
  Filled 2013-01-24 (×13): qty 1

## 2013-01-24 MED ORDER — GLYCOPYRROLATE 0.2 MG/ML IJ SOLN
INTRAMUSCULAR | Status: AC
  Filled 2013-01-24: qty 1

## 2013-01-24 MED ORDER — BELLADONNA ALKALOIDS-OPIUM 16.2-60 MG RE SUPP: 1.0000 | Freq: Four times a day (QID) | RECTAL | Status: DC | PRN

## 2013-01-24 SURGICAL SUPPLY — 90 items
ADH SKN CLS APL DERMABOND .7 (GAUZE/BANDAGES/DRESSINGS)
APL ESCP 34 STRL LF DISP (HEMOSTASIS)
APPLICATOR SURGIFLO ENDO (HEMOSTASIS) IMPLANT
BAG SPEC RTRVL LRG 6X4 10 (ENDOMECHANICALS)
BAG URO CATCHER STRL LF (DRAPE) ×1 IMPLANT
CANNULA SEAL DVNC (CANNULA) ×2 IMPLANT
CANNULA SEALS DA VINCI (CANNULA) ×1
CATH FOLEY 3WAY  5CC 18FR (CATHETERS) ×1
CATH FOLEY 3WAY 5CC 18FR (CATHETERS) ×2 IMPLANT
CATH URET 5FR 28IN OPEN ENDED (CATHETERS) ×1 IMPLANT
CHLORAPREP W/TINT 26ML (MISCELLANEOUS) ×3 IMPLANT
CLIP LIGATING HEM O LOK PURPLE (MISCELLANEOUS) ×6 IMPLANT
CLIP LIGATING HEMO LOK XL GOLD (MISCELLANEOUS) ×1 IMPLANT
CLIP LIGATING HEMO O LOK GREEN (MISCELLANEOUS) ×2 IMPLANT
CORDS BIPOLAR (ELECTRODE) ×3 IMPLANT
COVER SURGICAL LIGHT HANDLE (MISCELLANEOUS) ×3 IMPLANT
COVER TIP SHEARS 8 DVNC (MISCELLANEOUS) ×2 IMPLANT
COVER TIP SHEARS 8MM DA VINCI (MISCELLANEOUS) ×1
CUTTER FLEX LINEAR 45M (STAPLE) ×2 IMPLANT
DECANTER SPIKE VIAL GLASS SM (MISCELLANEOUS) ×2 IMPLANT
DERMABOND ADVANCED (GAUZE/BANDAGES/DRESSINGS)
DERMABOND ADVANCED .7 DNX12 (GAUZE/BANDAGES/DRESSINGS) ×4 IMPLANT
DRAIN CHANNEL 15F RND FF 3/16 (WOUND CARE) ×3 IMPLANT
DRAPE INCISE IOBAN 66X45 STRL (DRAPES) ×3 IMPLANT
DRAPE LAPAROSCOPIC ABDOMINAL (DRAPES) ×3 IMPLANT
DRAPE LG THREE QUARTER DISP (DRAPES) ×4 IMPLANT
DRAPE TABLE BACK 44X90 PK DISP (DRAPES) ×3 IMPLANT
DRAPE WARM FLUID 44X44 (DRAPE) ×3 IMPLANT
DRSG TEGADERM 4X4.75 (GAUZE/BANDAGES/DRESSINGS) ×6 IMPLANT
ELECT REM PT RETURN 9FT ADLT (ELECTROSURGICAL) ×3
ELECTRODE REM PT RTRN 9FT ADLT (ELECTROSURGICAL) ×2 IMPLANT
EVACUATOR SILICONE 100CC (DRAIN) ×3 IMPLANT
GAUZE SPONGE 2X2 8PLY STRL LF (GAUZE/BANDAGES/DRESSINGS) ×2 IMPLANT
GLOVE BIO SURGEON STRL SZ 6.5 (GLOVE) ×2 IMPLANT
GLOVE BIO SURGEON STRL SZ7.5 (GLOVE) ×2 IMPLANT
GLOVE BIOGEL M STRL SZ7.5 (GLOVE) ×8 IMPLANT
GLOVE BIOGEL PI IND STRL 7.0 (GLOVE) IMPLANT
GLOVE BIOGEL PI IND STRL 7.5 (GLOVE) IMPLANT
GLOVE BIOGEL PI INDICATOR 7.0 (GLOVE) ×1
GLOVE BIOGEL PI INDICATOR 7.5 (GLOVE) ×1
GLOVE SURG SS PI 6.5 STRL IVOR (GLOVE) ×1 IMPLANT
GLOVE SURG SS PI 8.5 STRL IVOR (GLOVE) ×2
GLOVE SURG SS PI 8.5 STRL STRW (GLOVE) IMPLANT
GOWN PREVENTION PLUS LG XLONG (DISPOSABLE) ×9 IMPLANT
GOWN STRL REIN XL XLG (GOWN DISPOSABLE) ×6 IMPLANT
GUIDEWIRE STR DUAL SENSOR (WIRE) ×1 IMPLANT
HEMOSTAT SURGICEL 4X8 (HEMOSTASIS) IMPLANT
KIT ACCESSORY DA VINCI DISP (KITS) ×1
KIT ACCESSORY DVNC DISP (KITS) ×2 IMPLANT
KIT BASIN OR (CUSTOM PROCEDURE TRAY) ×3 IMPLANT
LUBRICANT JELLY ST 5GR 8946 (MISCELLANEOUS) ×2 IMPLANT
NDL INSUFFLATION 14GA 120MM (NEEDLE) IMPLANT
NEEDLE INSUFFLATION 14GA 120MM (NEEDLE) ×3 IMPLANT
PACK CYSTO (CUSTOM PROCEDURE TRAY) ×1 IMPLANT
PENCIL BUTTON HOLSTER BLD 10FT (ELECTRODE) ×3 IMPLANT
PLUG CATH AND CAP STER (CATHETERS) ×3 IMPLANT
POSITIONER SURGICAL ARM (MISCELLANEOUS) ×6 IMPLANT
POUCH ENDO CATCH II 15MM (MISCELLANEOUS) ×3 IMPLANT
POUCH SPECIMEN RETRIEVAL 10MM (ENDOMECHANICALS) IMPLANT
RELOAD 45 VASCULAR/THIN (ENDOMECHANICALS) IMPLANT
RELOAD STAPLE 45 2.5 WHT GRN (ENDOMECHANICALS) ×4 IMPLANT
RELOAD WH ECHELON 45 (STAPLE) ×8 IMPLANT
SET TUBE IRRIG SUCTION NO TIP (IRRIGATION / IRRIGATOR) ×3 IMPLANT
SOLUTION ANTI FOG 6CC (MISCELLANEOUS) ×3 IMPLANT
SOLUTION ELECTROLUBE (MISCELLANEOUS) ×3 IMPLANT
SPONGE GAUZE 2X2 STER 10/PKG (GAUZE/BANDAGES/DRESSINGS) ×1
SPONGE LAP 18X18 X RAY DECT (DISPOSABLE) ×3 IMPLANT
SPONGE LAP 4X18 X RAY DECT (DISPOSABLE) ×1 IMPLANT
SURGIFLO W/THROMBIN 8M KIT (HEMOSTASIS) ×2 IMPLANT
SUT ETHILON 3 0 PS 1 (SUTURE) ×3 IMPLANT
SUT MNCRL AB 4-0 PS2 18 (SUTURE) ×6 IMPLANT
SUT PDS AB 1 CTX 36 (SUTURE) ×6 IMPLANT
SUT PDS AB 1 TP1 96 (SUTURE) ×1 IMPLANT
SUT V-LOC BARB 180 2/0GR6 GS22 (SUTURE)
SUT VIC AB 0 CT1 27 (SUTURE)
SUT VIC AB 0 CT1 27XBRD ANTBC (SUTURE) ×4 IMPLANT
SUT VIC AB 0 UR5 27 (SUTURE) ×2 IMPLANT
SUT VICRYL 0 UR6 27IN ABS (SUTURE) ×5 IMPLANT
SUT VLOC BARB 180 ABS3/0GR12 (SUTURE) ×3
SUTURE V-LC BRB 180 2/0GR6GS22 (SUTURE) ×2 IMPLANT
SUTURE VLOC BRB 180 ABS3/0GR12 (SUTURE) IMPLANT
TOWEL OR NON WOVEN STRL DISP B (DISPOSABLE) ×3 IMPLANT
TRAY FOLEY CATH 14FRSI W/METER (CATHETERS) ×2 IMPLANT
TRAY LAP CHOLE (CUSTOM PROCEDURE TRAY) ×3 IMPLANT
TROCAR 12M 150ML BLUNT (TROCAR) ×1 IMPLANT
TROCAR BLADELESS OPT 5 75 (ENDOMECHANICALS) ×1 IMPLANT
TROCAR XCEL 12X100 BLDLESS (ENDOMECHANICALS) ×4 IMPLANT
TUBING INSUFFLATION 10FT LAP (TUBING) ×3 IMPLANT
WATER STERILE IRR 1000ML UROMA (IV SOLUTION) ×4 IMPLANT
WATER STERILE IRR 1500ML POUR (IV SOLUTION) ×6 IMPLANT

## 2013-01-24 NOTE — Interval H&P Note (Signed)
History and Physical Interval Note: Continued right flank and pelvic pain. Watery bowel movements. W/u by GI negative thus far. PET-CT scan negative for metastatic disease.  RRR CTA-B  Plan: Proceed with right nephro-ureterectomy with right ureteral stent placement.  01/24/2013 12:54 PM  Angel Cooper  has presented today for surgery, with the diagnosis of RIGHT UPPER TRACE TRANSITIONAL CELL CARCINOMA   The various methods of treatment have been discussed with the patient and family. After consideration of risks, benefits and other options for treatment, the patient has consented to  Procedure(s): ROBOT ASSISTED LAPAROSCOPIC NEPHROURETERECTOMY WITH BILATERAL LYMPH NODE DISSECTION  (Bilateral) as a surgical intervention .  The patient's history has been reviewed, patient examined, no change in status, stable for surgery.  I have reviewed the patient's chart and labs.  Questions were answered to the patient's satisfaction.     Berniece Salines W

## 2013-01-24 NOTE — Brief Op Note (Signed)
01/24/2013  7:25 PM  PATIENT:  Rusty Aus  79 y.o. male  PRE-OPERATIVE DIAGNOSIS:  RIGHT UPPER TRACE TRANSITIONAL CELL CARCINOMA   POST-OPERATIVE DIAGNOSIS:  RIGHT UPPER TRACT TRANSITIONAL CELL CARCINOMA  PROCEDURE:  Rt Distal Ureterectomy, Pelvic Lymph Node Dissection  SURGEON:  Surgeon(s) and Role:    * Sebastian Ache, MD - Primary     * Crist Fat, MD - Assisting  PHYSICIAN ASSISTANT:   ASSISTANTS: none   ANESTHESIA:   regional and general  EBL:  Total I/O In: -  Out: 150 [Urine:100; Blood:50]  BLOOD ADMINISTERED:none  DRAINS: JP to bulb suction, foley to straight drain   LOCAL MEDICATIONS USED:  OTHER ropivicaine 50mL  SPECIMEN:  Source of Specimen:  Rt Kidney with ureter and bladder cuff, Rt ext. iliac lymph nodes, Rt common iliac lymph nodes  DISPOSITION OF SPECIMEN:  PATHOLOGY  COUNTS:  YES  TOURNIQUET:  * No tourniquets in log *  DICTATION: .Other Dictation: Dictation Number [id number]  PLAN OF CARE: Admit to inpatient   PATIENT DISPOSITION:  PACU - hemodynamically stable.   Delay start of Pharmacological VTE agent (>24hrs) due to surgical blood loss or risk of bleeding: not applicable

## 2013-01-24 NOTE — H&P (Signed)
Reason For Visit Follow-up for discussion of treatment for patients high-grade upper tract right TCC   Active Problems Problems  1. Coronary artery disease (414.00) 2. Periumbilical abdominal pain (789.05) 3. Ureteral mass (593.9) 4. Weight loss, unintentional (783.21)  History of Present Illness This is an 79M referred by Dr. Doristine Locks, MD for evaluation and management of right renal pelvis mass lesion seen on CT which was performed for abdominal pain and weight loss.    GU Hx: Patient's pain has been going on for several months now, it is located in the his epigastric/peri-umbilical region. It is improved when he eats. It is not constant, but comes on daily. He has lost 8 lbs. Has had associated slower bowel motility, hasn't noticed any blood per rectum. He denies any hematuria/dysuria/flank pain. Patient states that he's had stomach pain for > 62yrs and that often time he could double his prilosec and his symptoms would improve, however this hasn't worked for him this time. The patient has no history of recurrent kidney infections. he states he passed a stone a least 20 years ago and can't remember which side.  He has no PMH or FH of GU malignancy. He has a 10pkyr history of smoking. He was a Chief Operating Officer for [employer] Patient has a history of CABG in 2000 and hasn't had any angina since his surgery. He is able to work in the yard, but is limited by the pain in his knees.     Interval: The patient returns today for further discussion regarding the management of his right renal pelvis mass. I did have a second opinion read on the pathology which returned as high-grade urothelial carcinoma. Since the stent was removed the patient tells me that he has intermittent right back pain. This is keeping him from sleeping at night. He denies any fevers or chills. But he does say that he has increasing voiding symptoms. In addition the patient has ongoing trouble with his bowels. He is now  taking milk of magnesia 3 times a week. He tells me that his bowels are not normal, he has watery diarrhea with bowel movements. He recently has not been eating well either, he tells me that when he makes his baking soda with water. His symptoms improved significantly. He presents today with his wife   Past Medical History Problems  1. History of Anxiety (300.00) 2. History of acute myocardial infarction (412) 3. History of arthritis (V13.4) 4. History of cardiac disorder (V12.50) 5. History of esophageal reflux (V12.79) 6. History of gastric ulcer (V12.79) 7. History of hypercholesterolemia (V12.29) 8. History of hypertension (V12.59)  Surgical History Problems  1. History of Heart Surgery  Current Meds 1. Besivance 0.6 % Ophthalmic Suspension;  Therapy: 02Sep2014 to Recorded 2. Durezol 0.05 % Ophthalmic Emulsion;  Therapy: 23Sep2014 to Recorded 3. Enalapril Maleate 20 MG Oral Tablet;  Therapy: 26Dec2013 to Recorded 4. Hydrocodone-Acetaminophen 5-325 MG Oral Tablet;  Therapy: 16Jun2014 to Recorded 5. Ilevro 0.3 % Ophthalmic Suspension;  Therapy: 23Sep2014 to Recorded 6. Omeprazole 20 MG Oral Capsule Delayed Release;  Therapy: 16Dec2013 to Recorded 7. Ondansetron 4 MG Oral Tablet Dispersible; TAKE 1 TABLET 4 times daily PRN;  Therapy: 21Nov2014 to (Evaluate:05Dec2014)  Requested for: 21Nov2014; Last  Rx:21Nov2014 Ordered 8. Tamsulosin HCl - 0.4 MG Oral Capsule; TAKE 1 CAPSULE Daily;  Therapy: 20Nov2014 to (Evaluate:19Jan2015)  Requested for: 21Nov2014; Last  Rx:20Nov2014; Status: ACTIVE - Renewal Denied Ordered 9. Trospium Chloride ER 60 MG Oral Capsule Extended Release 24 Hour; TAKE (  1)  CAPSULE DAILY;  Therapy: 20Nov2014 to (Last Rx:20Nov2014)  Requested for: 20Nov2014 Ordered 10. Tylenol TABS; TAKE TABLET  PRN;   Therapy: (Recorded:27Oct2014) to Recorded 11. Vitamin B12 TABS;   Therapy: (Recorded:27Oct2014) to Recorded  Allergies Medication  1. Aspirin TABS 2.  Benadryl TABS  Family History Problems  1. Family history of malignant neoplasm (V16.9) : Mother  Social History Problems  1. Caffeine use (V49.89) 2. Death in the family, father   age 84 stroke 3. Death in the family, mother   age 41 complications from stomach surgery 4. 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 Retired 6. Two children   2 sons  Results/Data Urine [Data Includes: Last 1 Day]   09Dec2014  COLOR YELLOW   APPEARANCE CLEAR   SPECIFIC GRAVITY 1.015   pH 8.0   GLUCOSE NEG mg/dL  BILIRUBIN NEG   KETONE NEG mg/dL  BLOOD SMALL   PROTEIN NEG mg/dL  UROBILINOGEN 0.2 mg/dL  NITRITE NEG   LEUKOCYTE ESTERASE SMALL   SQUAMOUS EPITHELIAL/HPF NONE SEEN   WBC 11-20 WBC/hpf  RBC 11-20 RBC/hpf  BACTERIA NONE SEEN   CRYSTALS NONE SEEN   CASTS NONE SEEN    Pathology report from 12/15/12  #1 ureteropelvic junction/renal pelvis biopsied: Disaggregated fragment of highly atypical urothelium with squamoid differentiation - suspicious for urothelial dysplasia and/or neoplasia.  #2 selective urine cytology from the right renal pelvis: Atypical urothelial cells present that are not diagnostic of malignancy    Second opinion report dated 12/28/12, right renal pelvis biopsy: High-grade urothelial carcinoma. Unable to determine whether this is invasive as there is no muscle present on the biopsy specimen.   Assessment #1 high-grade upper tract TCC in the right renal pelvis with associated right flank pain  #2 abdominal pain, of unclear etiology . History of gastric ulcer.   #3 poor appetite and poor sleep recently.  #4 moderate comorbidities including age, history of CABG   Plan Health Maintenance  1. UA With REFLEX; Status:Complete;   Done: 09Dec2014 03:37PM Ureteral mass  2. OUTSIDE RAD MISC; Status:Hold For - Appointment,PreCert,Print,Records; Requested  for:09Dec2014;  3. Follow-up Schedule Surgery Office  Follow-up  Status:  Complete  Done: 09Dec2014  Discussion/Summary #1 high-grade upper tract TCC: Again we discussed the management of this being predominantly surgery. Given the patient's symptoms and moderate comorbidities, I do not think he is a good candidate for neoadjuvant chemotherapy. I went over the operation with the patient and his in detail - we discussed robotic nephroureterectomy. Again I discussed the risks and benefits of this operation and expected recovery. He would like to pursue this option after having considered no treatment/not doing anything over the last couple weeks. I am concerned that he is now having renal obstruction given his symptoms which may suggest that this disease process is invasive. As such, I think that we need to get a PET/CT scan to ensure that there is no evidence of metastases.    #2 abdominal pain - etiology of this pain remains unclear. The patient's symptoms respond to bicarbonate which may suggest that he has developed another gastric ulcer. It could also be associated with the kidney pathology. I would like the patient to be evaluated by GI prior to his surgery to ensure that he does not have a large ulcer which can only be made worse with high-risk surgery.    #3 I refilled the patient oxycodone for better pain control, I have given  him Ambien 5 mg daily at bedtime to help with his sleep. I warned him that Ambien can cause confusion and that if he gets confused that he should stop it immediately.     #4 provided cardiac clearance from the patient's PCP prior to proceeding with robotic nephroureterectomy.

## 2013-01-24 NOTE — Transfer of Care (Signed)
Immediate Anesthesia Transfer of Care Note  Patient: Angel Cooper  Procedure(s) Performed: Procedure(s) (LRB): ROBOT ASSISTED LAPAROSCOPIC NEPHROURETERECTOMY WITH BILATERAL LYMPH NODE DISSECTION  (Right) CYSTOSCOPY WITH STENT PLACEMENT (Right)  Patient Location: PACU  Anesthesia Type: General  Level of Consciousness: sedated, patient cooperative and responds to stimulation  Airway & Oxygen Therapy: Patient Spontanous Breathing and Patient connected to face mask oxgen  Post-op Assessment: Report given to PACU RN and Post -op Vital signs reviewed and stable  Post vital signs: Reviewed and stable  Complications: No apparent anesthesia complications

## 2013-01-24 NOTE — Op Note (Addendum)
Preoperative diagnosis:  1. Right upper tract TCC   Postoperative diagnosis:  1. As above   Procedure: 1. Cystoscopy 2. Right retrograde pyelogram with interpretation 3. Right ureteral stent placement 4. Right radical nephrectomy, partial ureterectomy 5. Distal ureterectomy with cystotomy closure 6. Right pelvic lymph node dissection 7. TAPs block  Surgeon: Crist Fat, MD Assistant: Dwana Curd, MD   Anesthesia: General  Complications: None  Intraoperative findings: Retrograde pyelogram revealed obstructed distal ureter and upper collecting system with hydronephrosis, difficulty getting 56F stent up right ureter, brisk hydronephrotic drip.  No evidence of metatstatic disease.  Distal ureter excised with bladder cuff, bladder closure leak tested and was dry.  EBL: Minimal  Specimens: right kidney, ureter, pelvic lymphnodes.  Indication: Angel Cooper is a 79 y.o. patient with right uppertract high grade TCC.  After reviewing the management options for treatment, he elected to proceed with the above surgical procedure(s). We have discussed the potential benefits and risks of the procedure, side effects of the proposed treatment, the likelihood of the patient achieving the goals of the procedure, and any potential problems that might occur during the procedure or recuperation. Informed consent has been obtained.  Description of procedure:  A site was selected near the umbilicus for placement of the camera port. This was placed using a standard open Hassan technique which allowed entry into the peritoneal cavity under direct vision and without difficulty. A 12 mm Hassan cannula was placed and a pneumoperitoneum established. The camera was then used to inspect the abdomen and there was no evidence of any intra-abdominal injuries or other abnormalities. The remaining abdominal ports were then placed.  Two 12 mm port were placed in the in the midline, one above the umbilicus and  one below, and a 5 mm port was placed in the right upper quadrant and the liver retracted throughout the case.  A robotic port was placed midline in the superpubic region above the bladder, the other two robotic ports were placed at 90 degrees angle from the camera, allowing to triangulate down to the kidney.   All ports were placed under direct vision without difficulty.  The white line of Toldt was incised allowing the colon to be mobilized medially and the plane between the mesocolon and the anterior layer of Gerota's fascia to be developed and the kidney exposed.  The ureter and gonadal vein were identified inferiorly and the ureter was lifted anteriorly off the psoas muscle.  Dissection proceeded superiorly along the gonadal vein until the renal vein was identified.  The renal hilum was then carefully isolated with a combination of blunt and sharp dissectiong allowing the renal arterial and venous structures to be separated and isolated.   The renal artery  was isolated and ligated with a 45 mm Flex ETS stapler. The renal vein was then isolated and also ligated and divided with a 45 mm Flex ETS stapler.  Gerota's fascia was intentionally entered superiorly and the space between the adrenal gland and the kidney was developed allowing the adrenal gland to be spared.  The hepatorenal ligaments were divided..  The lateral and posterior attachements to the kidney were then divided.  Once the kidney was free from its attachments and the proximal ureter dissected to the level of the iliac crossing the case was turned over to Dr. Dwana Curd for completion of the distal ureterectomy and closure of the bladder.  He also performed and right pelvic lymph node dissection.   The peritoneum was incised just  lateral to the right medial umbilical ligament in midline towards the area of the internal ring and coursing along the area of the internal iliac vessels towards the area of the previously mobilized ureter. The  bladder wall was carefully swept away from the lateral aspect of the pelvic side wall. Next, placing the ureter on very gentle alternating medial and lateral traction, circumferential mobilization was performed of the ureter. This was performed from the area of the iliac vessels towards the area of the bladder hiatus. At this point, circular detrusor muscles were clearly seen and the right ureter was placed on gentle superior traction. An extra large clip was placed across the distal ureter to prevent tumor cells spillage. Cystotomy was then began at the 3 o'clock position in the bladder cuff. Approximately quarter size was then excised circumferentially around the distal ureter. Small cystotomy was then closed using running 3-0 V-Loc beginning at 3 o'clock position towards the 6 o'clock position using 2 separate layers. Bladder was then irrigated with 200 mL fluid and cystotomy was found to be water tight. The free end of this was brought through several layers of peritoneum and a single clip applied, placing the clip well away from the suture line and cystotomy. Attention was then directed at pelvic lymphadenectomy. First, all the fiber fatty tissue in the confines of the right common iliac artery and vein were circumferentially mobilized from the area of the aortic bifurcation towards the area of the iliac bifurcation. Lymphostasis was achieved with cold clips. This was set aside, labeled right common iliac lymph nodes, none of these appeared to be pathologically enlarged. Next, all fiber and fatty tissue in the confines of the right external iliac artery, external iliac vein, pelvic side wall were carefully mobilized. Lymphostasis was achieved using cold clips. There appeared to be several identifiable nodes within this packet. This was set aside, labeled right external iliac lymph nodes. The area of the internal iliac group was inspected and no significant large nodes were seen. The obturator nerve was  inspected and found to be uninjured following these maneuvers. The nephroureteral specimen then was placed into extra large EndoCatch bag. The entire operative site from the bladder hiatus to the renal hilum and inferior border of the liver was once again reinspected. There was felt to be adequate hemostasis and lymphostasis.  40cc of 0.25% rupivocaine was then injected into the right anterior axillary line b/w the iliac crest and the twelfth rib under laparoscopic guidance.  The layer between the tranversus abdominus and the internal oblique was targeted. The kidney/ureter specimen was then placed into a 15 mm Endocatch II retrieval bag.  The renal hilum, liver, adrenal bed and gonadal vein areas were each inspected and hemostasis was ensured with the pneomperitoneal pressures lowered.  The lower midline 12mm port and the suprapubic robotic port were then connected sharply and opened so that the specimen could be retrieved through this lower midline incision.  The two remaining 12 mm ports and the  was then closed with a 0-vicryl suture placed laparoscopically to close the fascia of this incision. All remaining ports were removed under direct vision.  This fascial opening was then closed with two #1 PDS sutures.    All incisions were injected with local anesthetic and reapproximated at the skin with 4-0 monocryl sutures.  Dermabond was applied to the skin. The patient tolerated the procedure well and without complications and was transferred to the recovery unit in satisfactory condition.   Crist Fat, M.D.

## 2013-01-24 NOTE — Anesthesia Postprocedure Evaluation (Signed)
Anesthesia Post Note  Patient: Angel Cooper  Procedure(s) Performed: Procedure(s) (LRB): ROBOT ASSISTED LAPAROSCOPIC NEPHROURETERECTOMY WITH BILATERAL LYMPH NODE DISSECTION  (Right) CYSTOSCOPY WITH STENT PLACEMENT (Right)  Anesthesia type: General  Patient location: PACU  Post pain: Pain level controlled  Post assessment: Post-op Vital signs reviewed  Last Vitals: BP 142/63  Pulse 81  Temp(Src) 36.6 C (Oral)  Resp 16  SpO2 95%  Post vital signs: Reviewed  Level of consciousness: sedated  Complications: No apparent anesthesia complications

## 2013-01-25 ENCOUNTER — Encounter (HOSPITAL_COMMUNITY): Payer: Self-pay | Admitting: Urology

## 2013-01-25 LAB — CBC
HCT: 30.9 % — ABNORMAL LOW (ref 39.0–52.0)
Hemoglobin: 10.2 g/dL — ABNORMAL LOW (ref 13.0–17.0)
MCH: 29.8 pg (ref 26.0–34.0)
MCHC: 33 g/dL (ref 30.0–36.0)
MCV: 90.4 fL (ref 78.0–100.0)
Platelets: 471 K/uL — ABNORMAL HIGH (ref 150–400)
RBC: 3.42 MIL/uL — ABNORMAL LOW (ref 4.22–5.81)
RDW: 13.9 % (ref 11.5–15.5)
WBC: 18.4 K/uL — ABNORMAL HIGH (ref 4.0–10.5)

## 2013-01-25 LAB — BASIC METABOLIC PANEL
BUN: 18 mg/dL (ref 6–23)
CO2: 25 mEq/L (ref 19–32)
Chloride: 98 mEq/L (ref 96–112)
Creatinine, Ser: 1.58 mg/dL — ABNORMAL HIGH (ref 0.50–1.35)
GFR calc non Af Amer: 40 mL/min — ABNORMAL LOW (ref >90)
Glucose, Bld: 125 mg/dL — ABNORMAL HIGH (ref 70–99)
Sodium: 134 mEq/L — ABNORMAL LOW (ref 137–147)

## 2013-01-25 MED ORDER — ESOMEPRAZOLE MAGNESIUM 40 MG PO CPDR
40.0000 mg | DELAYED_RELEASE_CAPSULE | Freq: Every day | ORAL | Status: DC
  Administered 2013-01-25 – 2013-01-30 (×6): 40 mg via ORAL
  Filled 2013-01-25 (×6): qty 1

## 2013-01-25 MED ORDER — CALCIUM CARBONATE ANTACID 500 MG PO CHEW
800.0000 mg | CHEWABLE_TABLET | Freq: Two times a day (BID) | ORAL | Status: DC
  Administered 2013-01-25 – 2013-01-27 (×2): 800 mg via ORAL
  Filled 2013-01-25 (×12): qty 4

## 2013-01-25 MED ORDER — PNEUMOCOCCAL VAC POLYVALENT 25 MCG/0.5ML IJ INJ
0.5000 mL | INJECTION | INTRAMUSCULAR | Status: AC
  Administered 2013-01-26: 10:00:00 0.5 mL via INTRAMUSCULAR
  Filled 2013-01-25 (×2): qty 0.5

## 2013-01-25 MED ORDER — BOOST / RESOURCE BREEZE PO LIQD
1.0000 | Freq: Two times a day (BID) | ORAL | Status: DC
  Administered 2013-01-25 – 2013-01-29 (×6): 1 via ORAL

## 2013-01-25 NOTE — Progress Notes (Signed)
No urine in foley bag, bladder scan shows 160cc in bladder, Marchelle Folks at Dr. Elon Spanner office notified.

## 2013-01-25 NOTE — Progress Notes (Signed)
Urology Inpatient Progress Report  Intv/Subj: No acute events overnight. Patient had good night of rest. Acute incisional pain this AM, required IV pain medication rescue No nausea, tolerating H2O  Objective: Vital: Filed Vitals:   01/24/13 2119 01/24/13 2337 01/25/13 0223 01/25/13 0435  BP: 142/63  111/44 104/42  Pulse: 81  95 86  Temp: 97.8 F (36.6 C)  98.4 F (36.9 C) 98.3 F (36.8 C)  TempSrc:   Oral Oral  Resp: 16  18 18   Height:  5\' 6"  (1.676 m)    SpO2: 95%  98% 98%   I/Os: I/O last 3 completed shifts: In: 4300 [P.O.:200; I.V.:4100] Out: 1035 [Urine:675; Drains:160; Blood:200]  Past Medical History  Diagnosis Date  . Hypertension   . Hypercholesterolemia   . Myocardial infarction 1995  . Shortness of breath     occasionally with exertion  . GERD (gastroesophageal reflux disease)   . H/O hiatal hernia   . Arthritis   . H/O urinary frequency     with nocturia  . Coronary artery disease     NO LONGER SEES CARDIOLOGIST - LAST SAW DR. Mendel Ryder 2 OR MORE YRS AGO-PT SEES MEDICAL DR Earl Gala AT LEAST ONCE A  YEAR  . Pain     BACK PAIN - PT STATES HIS PAIN MAY BE COMING FROM THE URETERAL STENT  . Cancer   . Rash     Dr Margo Aye- scattered over body, reddened areas under the skin   Current Facility-Administered Medications  Medication Dose Route Frequency Provider Last Rate Last Dose  . acetaminophen (TYLENOL) tablet 1,000 mg  1,000 mg Oral Q6H Crist Fat, MD   1,000 mg at 01/25/13 0411  . ciprofloxacin (CIPRO) IVPB 200 mg  200 mg Intravenous Q12H Crist Fat, MD   200 mg at 01/24/13 2335  . dextrose 5 %-0.9 % sodium chloride infusion   Intravenous Continuous Crist Fat, MD 100 mL/hr at 01/24/13 2149    . docusate sodium (COLACE) capsule 100 mg  100 mg Oral BID Crist Fat, MD   100 mg at 01/24/13 2337  . heparin injection 5,000 Units  5,000 Units Subcutaneous Q12H Crist Fat, MD   5,000 Units at 01/24/13 2336  . HYDROmorphone  (DILAUDID) 1 MG/ML injection           . HYDROmorphone (DILAUDID) injection 0.5-1 mg  0.5-1 mg Intravenous Q2H PRN Crist Fat, MD   1 mg at 01/25/13 0745  . magnesium hydroxide (MILK OF MAGNESIA) suspension 30 mL  30 mL Oral Daily PRN Crist Fat, MD      . neomycin-bacitracin-polymyxin (NEOSPORIN) ointment 1 application  1 application Topical TID PRN Crist Fat, MD      . ondansetron [facility]) injection 4 mg  4 mg Intravenous Q4H PRN Crist Fat, MD      . opium-belladonna (B&O SUPPRETTES) suppository 1 suppository  1 suppository Rectal Q6H PRN Crist Fat, MD      . oxyCODONE (Oxy IR/ROXICODONE) immediate release tablet 5-10 mg  5-10 mg Oral Q4H PRN Crist Fat, MD      . pantoprazole (PROTONIX) EC tablet 40 mg  40 mg Oral Daily Crist Fat, MD      . zolpidem [facility]) tablet 5 mg  5 mg Oral QHS PRN Crist Fat, MD        Physical Exam:  General: Patient is in no apparent distress Lungs: Normal respiratory effort, chest expands symmetrically. GI: The  abdomen is soft and nontender, incisions c/d/i Drain with minimal serosanguinous output Ext: lower extremities symmetric  Lab Results:  Recent Labs  01/24/13 2015 01/25/13 0531  WBC 23.7* 18.4*  HGB 11.4* 10.2*  HCT 34.2* 30.9*    Recent Labs  01/24/13 2015 01/25/13 0531  NA 130* 134*  K 5.0 4.6  CL 96 98  CO2 22 25  GLUCOSE 144* 125*  BUN 18 18  CREATININE 1.25 1.58*  CALCIUM 8.6 8.1*   No results found for this basename: LABPT, INR,  in the last 72 hours No results found for this basename: LABURIN,  in the last 72 hours No results found for this or any previous visit.  Studies/Results: none  Assessment: 1 Day Post-Op s/p Robotic Assisted Lap Nephro-ureterectomy and pelvic node dissection.  Stable  Plan: PO pain meds, IV for break through Clear liquids, ADAT Continue IVF, trend Creatinine 24 hr abx for cystotomy, stop this PM Encourage ambulation SCDs,  SQH Foley stays for 10 days PT eval for return to home versus SNF  Expect patient to remain in house for 3-5 days   Crist Fat 01/25/2013, 7:51 AM

## 2013-01-25 NOTE — Progress Notes (Signed)
INITIAL NUTRITION ASSESSMENT  DOCUMENTATION CODES Per approved criteria  -Not Applicable   INTERVENTION: Provide Resource Breeze BID until diet advanced Diet advancement per MD discretion Provide Ensure Complete BID once diet advanced  NUTRITION DIAGNOSIS: Inadequate oral intake related to poor appetite and abdominal pain as evidenced by 4.5% weight loss in less than 2 months.   Goal: Pt to meet >/= 90% of their estimated nutrition needs   Monitor:  Diet advancement PO intake Weight Labs  Reason for Assessment: Malnutrition Screening Tool, score of 3  79 y.o. male  Admitting Dx: <principal problem not specified>  ASSESSMENT: 53 Male referred by Dr. Doristine Locks, MD for evaluation and management of right renal pelvis mass lesion seen on CT which was performed for abdominal pain and weight loss. Pt is now 1 day s/p robotic assisted lap nephro-ureterectomy and pelvic node dissection.  Pt states that he usually weighs 168 lbs but, since September he has had a decreased appetite and intermittent abdominal pain leading to weight loss. Pt reports eating well some days PTA but, eating 2-3 small meals/snacks most days since September. Pt complaining of abdominal pain at time of visit; just given pain medication per RN. Based on pt's report he has had 8% weight loss in the past 4 months. Per weight history, pt has lost 4.5% of his body weight in the past 2 months.   Height: Ht Readings from Last 1 Encounters:  01/24/13 5\' 6"  (1.676 m)    Weight: Wt Readings from Last 1 Encounters:  01/25/13 154 lb 1.6 oz (69.9 kg)    Ideal Body Weight: 142 lbs  % Ideal Body Weight: 108%  Wt Readings from Last 10 Encounters:  01/25/13 154 lb 1.6 oz (69.9 kg)  01/25/13 154 lb 1.6 oz (69.9 kg)  01/18/13 154 lb (69.854 kg)  12/01/12 163 lb (73.936 kg)    Usual Body Weight: 168 lbs  % Usual Body Weight: 92%  BMI:  Body mass index is 24.88 kg/(m^2).  Estimated Nutritional Needs: Kcal:  1600-1800 Protein: 70-80 grams Fluid: 1.8 L/day  Skin: perineum incision  Diet Order: Clear Liquid  EDUCATION NEEDS: -No education needs identified at this time   Intake/Output Summary (Last 24 hours) at 01/25/13 1125 Last data filed at 01/25/13 0700  Gross per 24 hour  Intake 5508.33 ml  Output   1035 ml  Net 4473.33 ml    Last BM: 12/28  Labs:   Recent Labs Lab 01/24/13 2015 01/25/13 0531  NA 130* 134*  K 5.0 4.6  CL 96 98  CO2 22 25  BUN 18 18  CREATININE 1.25 1.58*  CALCIUM 8.6 8.1*  GLUCOSE 144* 125*    CBG (last 3)  No results found for this basename: GLUCAP,  in the last 72 hours  Scheduled Meds: . acetaminophen  1,000 mg Oral Q6H  . docusate sodium  100 mg Oral BID  . heparin subcutaneous  5,000 Units Subcutaneous Q12H  . pantoprazole  40 mg Oral Daily    Continuous Infusions: . dextrose 5 % and 0.9% NaCl 100 mL/hr (01/25/13 1610)    Past Medical History  Diagnosis Date  . Hypertension   . Hypercholesterolemia   . Myocardial infarction 1995  . Shortness of breath     occasionally with exertion  . GERD (gastroesophageal reflux disease)   . H/O hiatal hernia   . Arthritis   . H/O urinary frequency     with nocturia  . Coronary artery disease     NO  LONGER SEES CARDIOLOGIST - LAST SAW DR. Mendel Ryder 2 OR MORE YRS AGO-PT SEES MEDICAL DR Earl Gala AT LEAST ONCE A  YEAR  . Pain     BACK PAIN - PT STATES HIS PAIN MAY BE COMING FROM THE URETERAL STENT  . Cancer   . Rash     Dr Margo Aye- scattered over body, reddened areas under the skin    Past Surgical History  Procedure Laterality Date  . Coronary artery bypass graft  1999  . Cardiac catheterization  1998  . Eye surgery Bilateral     cataract extraction with IOL  . Cystoscopy with retrograde pyelogram, ureteroscopy and stent placement Right 12/03/2012    Procedure: CYSTOSCOPY WITH RIGHT RETROGRADE PYELOGRAM,RIGHT URETERAL WASHINGS, RIGHT URETERAL BIOPSY, RIGHT URETEROSCOPY AND STENT PLACEMENT;   Surgeon: Crist Fat, MD;  Location: [facility];  Service: Urology;  Laterality: Right;  . Cystoscopy with retrograde pyelogram, ureteroscopy and stent placement Right 12/15/2012    Procedure: CYSTOSCOPY WITH RIGHT RETROGRADE PYELOGRAM, POSSIBLE RIGHT URETEROSCOPY AND STENT PLACEMENT AND REMOVAL OF LEFT STENT;  Surgeon: Crist Fat, MD;  Location: [facility];  Service: Urology;  Laterality: Right;  . Robot assited laparoscopic nephroureterectomy Right 01/24/2013    Procedure: ROBOT ASSISTED LAPAROSCOPIC NEPHROURETERECTOMY WITH BILATERAL LYMPH NODE DISSECTION ;  Surgeon: Crist Fat, MD;  Location: [facility];  Service: Urology;  Laterality: Right;  right kidney  . Cystoscopy with stent placement Right 01/24/2013    Procedure: CYSTOSCOPY WITH STENT PLACEMENT;  Surgeon: Crist Fat, MD;  Location: [facility];  Service: Urology;  Laterality: Right;    Ian Malkin RD, LDN Inpatient Clinical Dietitian Pager: [phone] After Hours Pager: [phone]

## 2013-01-25 NOTE — Care Management Note (Addendum)
    Page 1 of 1   01/25/2013     2:44:23 PM   CARE MANAGEMENT NOTE 01/25/2013  Patient:  Angel, Cooper   Account Number:  [id number]  Date Initiated:  01/25/2013  Documentation initiated by:  Lanier Clam  Subjective/Objective Assessment:   79 Y/O M ADMITTED W/URETERAL CANCER.     Action/Plan:   FROM HOME.   Anticipated DC Date:  01/28/2013   Anticipated DC Plan:  HOME W HOME HEALTH SERVICES      DC Planning Services  CM consult      Choice offered to / List presented to:  C-1 Patient           Status of service:  In process, will continue to follow Medicare Important Message given?   (If response is "NO", the following Medicare IM given date fields will be blank) Date Medicare IM given:   Date Additional Medicare IM given:    Discharge Disposition:    Per UR Regulation:  Reviewed for med. necessity/level of care/duration of stay  If discussed at Long Length of Stay Meetings, dates discussed:    Comments:  01/25/13 Khristian Seals RN,BSN NCM [phone] [facility] CHOSEN FOR HHPT,TC KRISTEN AWARE OF REFERRAL.AWAIT FINAL HHPT ORDER. PROVIDED [facility] AGENCY LIST.AWAIT FINAL HHPT ORDER.

## 2013-01-25 NOTE — Evaluation (Signed)
Physical Therapy Evaluation Patient Details Name: Angel Cooper MRN: [id number] DOB: [birth year] Today's Date: 01/25/2013 Time: 9147-8295 PT Time Calculation (min): 18 min  PT Assessment / Plan / Recommendation History of Present Illness  79 yo male s/p robotic assisted lap nephro ureterectomy. Hx of MI, anxiety.   Clinical Impression  On eval, pt required Min assist for bed mobility and stand pivot transfer with walker. Demonstrates general weakness, decreased activity tolerance, and impaired gait and balance. Recommend HHPT and 24/7 care, at this time. Wife present and states she can assist pt at home.     PT Assessment  Patient needs continued PT services    Follow Up Recommendations  Home health PT;Supervision/Assistance - 24 hour    Does the patient have the potential to tolerate intense rehabilitation      Barriers to Discharge        Equipment Recommendations  None recommended by PT    Recommendations for Other Services OT consult   Frequency Min 3X/week    Precautions / Restrictions Precautions Precautions: Fall Precaution Comments: R drain Restrictions Weight Bearing Restrictions: No   Pertinent Vitals/Pain Abdomen 5/10 at rest.       Mobility  Bed Mobility Bed Mobility: Supine to Sit Supine to Sit: 4: Min assist;With rails Details for Bed Mobility Assistance: Increased time. Assist for LEs and trunk to full upright. Utilized bedpad for scooting, postioning.  Transfers Transfers: Sit to Stand;Stand to [employer] Transfers Sit to Stand: 4: Min assist;From bed Stand to Sit: 4: Min assist;To chair/3-in-1 Stand Pivot Transfers: 4: Min assist Details for Transfer Assistance: Assist to rise, stabilize, control descent. VCs safety, technique, hand placement. Increased time.  Ambulation/Gait Ambulation/Gait Assistance: Not tested (comment)    Exercises     PT Diagnosis: Difficulty walking;Generalized weakness;Acute pain  PT Problem List: Decreased  strength;Decreased activity tolerance;Decreased balance;Decreased mobility;Pain;Decreased knowledge of use of DME PT Treatment Interventions: DME instruction;Gait training;Functional mobility training;Therapeutic activities;Therapeutic exercise;Balance training;Patient/family education     PT Goals(Current goals can be found in the care plan section) Acute Rehab PT Goals Patient Stated Goal: less pain. home.  PT Goal Formulation: With patient/family Time For Goal Achievement: 02/08/13 Potential to Achieve Goals: Good  Visit Information  Last PT Received On: 01/25/13 Assistance Needed: +1 History of Present Illness: 79 yo male s/p robotic assisted lap nephro ureterectomy. Hx of MI, anxiety.        Prior Functioning  Home Living Family/patient expects to be discharged to:: Private residence Living Arrangements: Spouse/significant other Available Help at Discharge: Family Type of Home: House Home Access: Stairs to enter [employer] of Steps: 2-3 Entrance Stairs-Rails: Right Home Layout: One level Home Equipment: Cane - single point;Walker - 2 wheels Prior Function Level of Independence: Independent with assistive device(s) Comments: used cane for ambulation Communication Communication: No difficulties    Cognition  Cognition Arousal/Alertness: Awake/alert Behavior During Therapy: WFL for tasks assessed/performed Overall Cognitive Status: Within Functional Limits for tasks assessed    Extremity/Trunk Assessment Upper Extremity Assessment Upper Extremity Assessment: Generalized weakness Lower Extremity Assessment Lower Extremity Assessment: Generalized weakness Cervical / Trunk Assessment Cervical / Trunk Assessment: Kyphotic   Balance    End of Session PT - End of Session Equipment Utilized During Treatment: Oxygen Activity Tolerance: Patient limited by fatigue;Patient limited by pain Patient left: in chair;with call bell/phone within reach;with  family/visitor present  GP     Rebeca Alert, MPT Pager: [phone]

## 2013-01-25 NOTE — Progress Notes (Signed)
Agree with the previous RN's assessment. Will continue to monitor patient.Angel Cooper, [name]  

## 2013-01-25 NOTE — Op Note (Signed)
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  PROCEDURE: 1. Right distal ureterectomy. 2. Right pelvic lymph node dissection.  SURGEON:  Sebastian Ache, MD.  ASSISTANT:  Dr. Berniece Salines.  ESTIMATED BLOOD LOSS:  150 mL total.  DRAINS: 1. Jackson-Pratt drain to bulb suction. 2. Foley catheter to straight drain.  SPECIMEN: 1. Right nephroureterectomy with bladder cuff. 2. Right external iliac lymph nodes. 3. Right common iliac lymph nodes.  FINDINGS: 1. One artery, one vein right renovascular anatomy. 2. No obvious gross leak from bladder cuff following irrigation and     cystotomy closure.  INDICATION:  Angel Cooper is a pleasant 79 year old gentleman who was found to have a large volume right renal pelvis high-grade urothelial carcinoma by Dr. Marlou Porch.  He is undergoing a right nephroureterectomy today.  He is having a team approach to this procedure with a kidney part being done primarily by Dr. Marlou Porch and distal ureter and pelvic lymph nodes being done by myself.  Informed consent was obtained and placed in medical record.  Details of indication further elaborated by Dr. Marlou Porch in separate operative note.  PROCEDURE IN DETAIL:  The patient being Angel Cooper verified, procedure being right nephroureterectomy laparoscopic was confirmed. Procedure was carried out.  Intravenous antibiotics were administered. Ports were then previously placed as per separate operative note with 3 robotic arms, 2 assistant ports, and a camera port, as well as liver retraction port on adequate position.  Dr. Marlou Porch had already performed right  nephrectomy with partial ureterectomy dissecting ureter down to the iliac crossing, at which point, I began the primary surgery.  The peritoneum was incised just lateral to the right medial umbilical ligament in midline towards the area of the internal ring and coursing along the area of the internal iliac vessels towards the area of the previously mobilized ureter.  The bladder wall was carefully swept away from the lateral aspect of the pelvic side wall.  Next, placing the ureter on very gentle alternating medial and lateral traction, circumferential mobilization was performed of the ureter.  This was performed from the area of the iliac vessels towards the area of the bladder hiatus.  At this point, circular detrusor muscles were clearly seen and the right ureter was placed on gentle superior traction.  An extra large clip was placed across the distal ureter to prevent tumor cells spillage.  Cystotomy was then began at the 3 o'clock position in the bladder cuff.  Approximately quarter size was then excised circumferentially around the distal ureter.  Small cystotomy was then closed using running 3-0 V-Loc beginning at 3 o'clock position towards the 6 o'clock position using 2 separate layers.  Bladder was then irrigated with 100 mL fluid and cystotomy was found to be water tight. The free end of this was brought through several layers of peritoneum and a  single clip applied, placing the clip well away from the suture line and cystotomy.  Attention was then directed at pelvic lymphadenectomy.  First, all the fiber fatty tissue in the confines of the right common iliac artery and vein were circumferentially mobilized from the area of the aortic bifurcation towards the area of the iliac bifurcation.  Lymphostasis was achieved with cold clips.  This was set aside, labeled right common iliac lymph nodes and these appeared to be pathologically enlarged.  Next, all fiber and fatty tissue in  the confines of the right external iliac artery, external iliac vein, pelvic side wall were carefully mobilized.  Lymphostasis was achieved using cold clips.  There appeared to be several identifiable nodes within this packet.  This was set aside, labeled right external iliac lymph nodes. The area of the internal iliac group was inspected and no significant large nodes were seen.  The obturator nerve was inspected and found to be uninjured following these maneuvers.  The nephroureteral specimen then was placed into extra large EndoCatch bag.  The entire operative site from the bladder hiatus to the renal hilum and inferior border of the liver was once again reinspected.  There was felt to be adequate hemostasis and lymphostasis.  Next, a closure was performed in a TAP block performed by Dr. Marlou Porch as a separate operative note.          ______________________________ Sebastian Ache, MD     TM/MEDQ  D:  01/24/2013  T:  01/25/2013  Job:  [id number]

## 2013-01-25 NOTE — Progress Notes (Signed)
Foley gently irrigated with 25cc sterile saline per Dr. Elon Spanner verbal order. 225cc tea colored urine returned with several clots. Patient tol well.

## 2013-01-26 LAB — BASIC METABOLIC PANEL
BUN: 12 mg/dL (ref 6–23)
CO2: 23 mEq/L (ref 19–32)
Chloride: 97 mEq/L (ref 96–112)
Creatinine, Ser: 1.58 mg/dL — ABNORMAL HIGH (ref 0.50–1.35)
GFR calc Af Amer: 46 mL/min — ABNORMAL LOW (ref >90)
GFR calc non Af Amer: 40 mL/min — ABNORMAL LOW (ref >90)
Potassium: 4.5 mEq/L (ref 3.7–5.3)

## 2013-01-26 LAB — CBC
HCT: 31.4 % — ABNORMAL LOW (ref 39.0–52.0)
MCHC: 31.8 g/dL (ref 30.0–36.0)
Platelets: 432 10*3/uL — ABNORMAL HIGH (ref 150–400)
RDW: 14.1 % (ref 11.5–15.5)
WBC: 15.7 10*3/uL — ABNORMAL HIGH (ref 4.0–10.5)

## 2013-01-26 MED ORDER — KCL IN DEXTROSE-NACL 10-5-0.45 MEQ/L-%-% IV SOLN
INTRAVENOUS | Status: DC
  Administered 2013-01-26 – 2013-01-29 (×4): via INTRAVENOUS
  Filled 2013-01-26 (×7): qty 1000

## 2013-01-26 MED ORDER — BISACODYL 10 MG RE SUPP
10.0000 mg | Freq: Every day | RECTAL | Status: DC | PRN
  Administered 2013-01-26: 19:00:00 10 mg via RECTAL
  Filled 2013-01-26: qty 1

## 2013-01-26 NOTE — Progress Notes (Signed)
[facility]  Patient Status: New  [facility] is providing the following services: RN.   [facility] office will need to be contacted on 01/27/13 if the patient discharges and [facility] RN orders are still needed.   Thank you.    If patient discharges after hours, please call [phone].   Lanae Crumbly 01/26/2013, 4:28 PM

## 2013-01-26 NOTE — Progress Notes (Addendum)
Foley in place, draining clear yellow urine, no clot noted.  But  low urine output over 8hrs period, urine output is 350cc, bladder scan for 0cc,  notified Dr. Isabel Caprice, no new order given will continue to monitor.

## 2013-01-26 NOTE — Progress Notes (Signed)
PT Cancellation Note  Patient Details Name: ABDULAHI SCHOR MRN: [id number] DOB: [birth year]   Cancelled Treatment:    Reason Eval/Treat Not Completed: pt declined to participate with therapy-just returned to bed. Pt and family report pt has been ambulating in hallway. Will check back another day.    Rebeca Alert, MPT Pager: [phone]

## 2013-01-26 NOTE — Progress Notes (Signed)
Urology Inpatient Progress Report  Intv/Subj: No acute events overnight. Patient had good night of rest. No flatus, no nausea, tolerating small amts of regular diet Pain well controlled Able to get out of bed several times and walk short distances   Objective: Vital: Filed Vitals:   01/25/13 1040 01/25/13 1453 01/25/13 2120 01/26/13 0452  BP: 151/57 117/54 149/57 131/64  Pulse: 77 85 94 108  Temp: 97.8 F (36.6 C) 98 F (36.7 C) 97.7 F (36.5 C) 98.3 F (36.8 C)  TempSrc: Oral Oral Oral Oral  Resp: 16 18 18 18   Height:      Weight:      SpO2: 97% 96% 95% 92%   I/Os: I/O last 3 completed shifts: In: 6763.3 [P.O.:1520; I.V.:5043.3; IV Piggyback:200] Out: 2045 [Urine:1600; Drains:395; Blood:50]  Past Medical History  Diagnosis Date  . Hypertension   . Hypercholesterolemia   . Myocardial infarction 1995  . Shortness of breath     occasionally with exertion  . GERD (gastroesophageal reflux disease)   . H/O hiatal hernia   . Arthritis   . H/O urinary frequency     with nocturia  . Coronary artery disease     NO LONGER SEES CARDIOLOGIST - LAST SAW DR. Mendel Ryder 2 OR MORE YRS AGO-PT SEES MEDICAL DR Earl Gala AT LEAST ONCE A  YEAR  . Pain     BACK PAIN - PT STATES HIS PAIN MAY BE COMING FROM THE URETERAL STENT  . Cancer   . Rash     Dr Margo Aye- scattered over body, reddened areas under the skin   Current Facility-Administered Medications  Medication Dose Route Frequency Provider Last Rate Last Dose  . acetaminophen (TYLENOL) tablet 1,000 mg  1,000 mg Oral Q6H Crist Fat, MD   1,000 mg at 01/25/13 1617  . calcium carbonate (TUMS - dosed in mg elemental calcium) chewable tablet 800 mg of elemental calcium  800 mg of elemental calcium Oral BID Crist Fat, MD   800 mg of elemental calcium at 01/25/13 1720  . dextrose 5 %-0.9 % sodium chloride infusion   Intravenous Continuous Crist Fat, MD 50 mL/hr at 01/26/13 [id number]    . docusate sodium (COLACE) capsule  100 mg  100 mg Oral BID Crist Fat, MD   100 mg at 01/25/13 2131  . esomeprazole (NEXIUM) capsule 40 mg  40 mg Oral Daily Crist Fat, MD   40 mg at 01/25/13 1343  . feeding supplement (RESOURCE BREEZE) (RESOURCE BREEZE) liquid 1 Container  1 Container Oral BID BM Lorraine Lax, RD   1 Container at 01/25/13 1341  . heparin injection 5,000 Units  5,000 Units Subcutaneous Q12H Crist Fat, MD   5,000 Units at 01/25/13 2132  . HYDROmorphone (DILAUDID) injection 0.5-1 mg  0.5-1 mg Intravenous Q2H PRN Crist Fat, MD   0.5 mg at 01/26/13 0656  . magnesium hydroxide (MILK OF MAGNESIA) suspension 30 mL  30 mL Oral Daily PRN Crist Fat, MD      . neomycin-bacitracin-polymyxin (NEOSPORIN) ointment 1 application  1 application Topical TID PRN Crist Fat, MD      . ondansetron [facility]) injection 4 mg  4 mg Intravenous Q4H PRN Crist Fat, MD      . opium-belladonna (B&O SUPPRETTES) suppository 1 suppository  1 suppository Rectal Q6H PRN Crist Fat, MD      . oxyCODONE (Oxy IR/ROXICODONE) immediate release tablet 5-10 mg  5-10 mg Oral Q4H PRN  Crist Fat, MD   10 mg at 01/26/13 0457  . pneumococcal 23 valent vaccine (PNU-IMMUNE) injection 0.5 mL  0.5 mL Intramuscular Tomorrow-1000 Crist Fat, MD      . zolpidem [facility]) tablet 5 mg  5 mg Oral QHS PRN Crist Fat, MD        Physical Exam:  General: Patient is in no apparent distress Lungs: Normal respiratory effort, chest expands symmetrically. GI: The abdomen is soft and nontender, incisions c/d/i, ecchymosis on the superior aspect of the lowest midline incision Drain with minimal serosanguinous output Ext: lower extremities symmetric  Lab Results:  Recent Labs  01/24/13 2015 01/25/13 0531 01/26/13 0528  WBC 23.7* 18.4* 15.7*  HGB 11.4* 10.2* 10.0*  HCT 34.2* 30.9* 31.4*    Recent Labs  01/24/13 2015 01/25/13 0531 01/26/13 0528  NA 130* 134* 133*  K 5.0  4.6 4.5  CL 96 98 97  CO2 22 25 23   GLUCOSE 144* 125* 121*  BUN 18 18 12   CREATININE 1.25 1.58* 1.58*  CALCIUM 8.6 8.1* 7.9*   No results found for this basename: LABPT, INR,  in the last 72 hours No results found for this basename: LABURIN,  in the last 72 hours No results found for this or any previous visit.  Studies/Results: none  Assessment: 2 Days Post-Op s/p Robotic Assisted Lap Nephro-ureterectomy and pelvic node dissection.  Stable  Plan: PO pain meds, IV for break through Regular diet MIVF, trend Creatinine D/c abx Encourage ambulation SCDs, SQH Foley stays for 10 days PT eval for return to home versus SNF  Expect patient to remain in house for 3-5 days   Crist Fat 01/26/2013, 8:45 AM

## 2013-01-27 DIAGNOSIS — I10 Essential (primary) hypertension: Secondary | ICD-10-CM

## 2013-01-27 DIAGNOSIS — C669 Malignant neoplasm of unspecified ureter: Secondary | ICD-10-CM

## 2013-01-27 DIAGNOSIS — I25810 Atherosclerosis of coronary artery bypass graft(s) without angina pectoris: Secondary | ICD-10-CM

## 2013-01-27 DIAGNOSIS — I509 Heart failure, unspecified: Secondary | ICD-10-CM

## 2013-01-27 DIAGNOSIS — I4891 Unspecified atrial fibrillation: Secondary | ICD-10-CM

## 2013-01-27 HISTORY — DX: Unspecified atrial fibrillation: I48.91

## 2013-01-27 HISTORY — DX: Heart failure, unspecified: I50.9

## 2013-01-27 LAB — BASIC METABOLIC PANEL
BUN: 12 mg/dL (ref 6–23)
CO2: 23 mEq/L (ref 19–32)
Calcium: 8 mg/dL — ABNORMAL LOW (ref 8.4–10.5)
Chloride: 100 mEq/L (ref 96–112)
Creatinine, Ser: 1.45 mg/dL — ABNORMAL HIGH (ref 0.50–1.35)
GFR calc Af Amer: 51 mL/min — ABNORMAL LOW (ref >90)
GFR calc non Af Amer: 44 mL/min — ABNORMAL LOW (ref >90)
Glucose, Bld: 118 mg/dL — ABNORMAL HIGH (ref 70–99)
Potassium: 4.5 mEq/L (ref 3.7–5.3)
Sodium: 135 mEq/L — ABNORMAL LOW (ref 137–147)

## 2013-01-27 LAB — MRSA PCR SCREENING: MRSA by PCR: NEGATIVE

## 2013-01-27 MED ORDER — FLEET ENEMA 7-19 GM/118ML RE ENEM
1.0000 | ENEMA | Freq: Once | RECTAL | Status: AC
  Administered 2013-01-27: 17:00:00 1 via RECTAL
  Filled 2013-01-27: qty 1

## 2013-01-27 MED ORDER — HEPARIN SODIUM (PORCINE) 5000 UNIT/ML IJ SOLN
5000.0000 [IU] | Freq: Three times a day (TID) | INTRAMUSCULAR | Status: DC
Start: 2013-01-27 — End: 2013-01-30
  Administered 2013-01-27 – 2013-01-30 (×8): 5000 [IU] via SUBCUTANEOUS
  Filled 2013-01-27 (×12): qty 1

## 2013-01-27 MED ORDER — DILTIAZEM LOAD VIA INFUSION
15.0000 mg | Freq: Once | INTRAVENOUS | Status: AC
  Administered 2013-01-27: 21:00:00 15 mg via INTRAVENOUS
  Filled 2013-01-27: qty 15

## 2013-01-27 MED ORDER — METOPROLOL TARTRATE 1 MG/ML IV SOLN
INTRAVENOUS | Status: AC
  Filled 2013-01-27: qty 5

## 2013-01-27 MED ORDER — MAGNESIUM HYDROXIDE 400 MG/5ML PO SUSP
30.0000 mL | Freq: Every day | ORAL | Status: DC | PRN
  Administered 2013-01-27 – 2013-01-29 (×2): 30 mL via ORAL
  Filled 2013-01-27 (×2): qty 30

## 2013-01-27 MED ORDER — METOPROLOL TARTRATE 25 MG PO TABS
25.0000 mg | ORAL_TABLET | Freq: Three times a day (TID) | ORAL | Status: DC
  Administered 2013-01-27 – 2013-01-29 (×6): 25 mg via ORAL
  Filled 2013-01-27 (×8): qty 1

## 2013-01-27 MED ORDER — METOPROLOL TARTRATE 1 MG/ML IV SOLN
2.5000 mg | INTRAVENOUS | Status: DC | PRN
  Administered 2013-01-27: 2.5 mg via INTRAVENOUS

## 2013-01-27 MED ORDER — SODIUM CHLORIDE 0.9 % IV SOLN
500.0000 mL | Freq: Once | INTRAVENOUS | Status: AC
  Administered 2013-01-27: 500 mL via INTRAVENOUS

## 2013-01-27 MED ORDER — SODIUM CHLORIDE 0.9 % IV SOLN
INTRAVENOUS | Status: DC
  Administered 2013-01-27: 23:00:00 via INTRAVENOUS

## 2013-01-27 MED ORDER — DILTIAZEM HCL 100 MG IV SOLR
5.0000 mg/h | INTRAVENOUS | Status: DC
  Administered 2013-01-27: 21:00:00 10 mg/h via INTRAVENOUS
  Filled 2013-01-27: qty 100

## 2013-01-27 MED ORDER — LORAZEPAM 0.5 MG PO TABS
0.5000 mg | ORAL_TABLET | Freq: Once | ORAL | Status: AC
  Administered 2013-01-27: 0.5 mg via ORAL
  Filled 2013-01-27: qty 1

## 2013-01-27 NOTE — Progress Notes (Signed)
Pt reports that he feels much relief after enema and subsequent BM.

## 2013-01-27 NOTE — Progress Notes (Signed)
At the beginning of the shift, RN started getting alerts that the patient's HR was in the 150's-160's sustaining. Then RN got a text that patient had converted into  A.fib. RN got an EKG done which showed a.fib with RVR. Patient stable and vital signs 152/87. Temp 98.0, oxygen 97% on room air, respirations 16. RN contacted the doctor on call and then the doctor contacted the cardiologist oncall to call the RN back. While waiting for the cardiologist to call back, the rapid response nurse was called to come see the patient. While waiting, the on call urologist was called back to get an order for hospitalist to come see the patient and if he could call the hospitalist as well. After paging the hospitalist twice, they called back and put in orders were a cardizem drip and to transfer down to stepdown. During this time patient stayed stable, but BP was slowing dropping. Around 915pm, AC, rapid response nurse, and patient's floor nurse brought patient down to the ICU. Bedside report was done downstairs in the ICU.

## 2013-01-27 NOTE — Consult Note (Signed)
[facility]                                                                                    Patient Demographics  Jaber Dunlow, is a 80 y.o. male  CSN: [id number]  MRN: [id number]  DOB - 07-26-1930  Admit Date - 01/24/2013  Outpatient Primary MD for the patient is Horton Finer, MD   With History of -  Past Medical History  Diagnosis Date  . Hypertension   . Hypercholesterolemia   . Myocardial infarction 1995  . Shortness of breath     occasionally with exertion  . GERD (gastroesophageal reflux disease)   . H/O hiatal hernia   . Arthritis   . H/O urinary frequency     with nocturia  . Coronary artery disease     NO LONGER SEES CARDIOLOGIST - LAST SAW DR. [name] 2 OR MORE YRS AGO-PT SEES MEDICAL DR Maxwell Caul AT LEAST ONCE A  YEAR  . Pain     BACK PAIN - PT STATES HIS PAIN MAY BE COMING FROM THE URETERAL STENT  . Cancer   . Rash     Dr [place] Crane- scattered over body, reddened areas under the skin      Past Surgical History  Procedure Laterality Date  . Coronary artery bypass graft  1999  . Cardiac catheterization  1998  . Eye surgery Bilateral     cataract extraction with IOL  . Cystoscopy with retrograde pyelogram, ureteroscopy and stent placement Right 12/03/2012    Procedure: CYSTOSCOPY WITH RIGHT RETROGRADE PYELOGRAM,RIGHT URETERAL WASHINGS, RIGHT URETERAL BIOPSY, RIGHT URETEROSCOPY AND STENT PLACEMENT;  Surgeon: [name], MD;  Location: [facility];  Service: Urology;  Laterality: Right;  . Cystoscopy with retrograde pyelogram, ureteroscopy and stent placement Right 12/15/2012    Procedure: CYSTOSCOPY WITH RIGHT RETROGRADE PYELOGRAM, POSSIBLE RIGHT URETEROSCOPY AND STENT PLACEMENT AND REMOVAL OF LEFT STENT;  Surgeon: [name], MD;  Location: [facility];  Service: Urology;  Laterality: Right;  . Robot assited laparoscopic nephroureterectomy Right 01/24/2013    Procedure: ROBOT ASSISTED LAPAROSCOPIC NEPHROURETERECTOMY WITH BILATERAL  LYMPH NODE DISSECTION ;  Surgeon: [name], MD;  Location: [facility];  Service: Urology;  Laterality: Right;  right kidney  . Cystoscopy with stent placement Right 01/24/2013    Procedure: CYSTOSCOPY WITH STENT PLACEMENT;  Surgeon: [name], MD;  Location: [facility];  Service: Urology;  Laterality: Right;    in for   No chief complaint on file.    HPI  Coreon Simkins  is a 80 y.o. male, who is status post right nephro ureterectomy, postop day #4 , and was having a smooth postop course , suddenly developed at the A. fib with rapid ventricular rate. The patient was started on Cardizem drip and I was called for consult. Patient denies any chest pain, shortness of breath or even palpitations. No nausea or vomiting. His by mouth intake has been decreased according to his nurse.    Review of Systems    In addition to the HPI above,  No Fever-chills, No Headache, No changes with Vision or hearing, No problems swallowing food or Liquids, No Chest pain,  Cough or Shortness of Breath, Moderate postop Abdominal pain, No Nausea or Vommitting,  No Blood in stool or Urine, No dysuria, No new skin rashes or bruises, No new joints pains-aches,  No new weakness, tingling, numbness in any extremity, No recent weight gain or loss, No polyuria, polydypsia or polyphagia, No significant Mental Stressors.  A full 10 point Review of Systems was done, except as stated above, all other Review of Systems were negative.   Social History History  Substance Use Topics  . Smoking status: Former Smoker    Quit date: 01/27/1958  . Smokeless tobacco: Never Used  . Alcohol Use: Yes     Comment: 2 -3 beers or mixed drink week     Family History Denies any  Prior to Admission medications   Medication Sig Start Date End Date Taking? Authorizing Provider  acetaminophen (TYLENOL) 500 MG tablet Take 1,000 mg by mouth every 6 (six) hours as needed.   Yes Historical Provider, MD   CYANOCOBALAMIN IJ Inject as directed every 30 (thirty) days.   Yes Historical Provider, MD  enalapril (VASOTEC) 20 MG tablet Take 20 mg by mouth daily with breakfast.    Yes Historical Provider, MD  esomeprazole (NEXIUM) 40 MG capsule Take 40 mg by mouth daily at 12 noon.   Yes Historical Provider, MD  omeprazole (PRILOSEC) 20 MG capsule Take 20 mg by mouth daily.   Yes Historical Provider, MD  oxycodone (OXY-IR) 5 MG capsule Take 5 mg by mouth every 4 (four) hours as needed for pain.   Yes Historical Provider, MD  zolpidem (AMBIEN) 5 MG tablet Take 5 mg by mouth at bedtime as needed for sleep.   Yes Historical Provider, MD  magnesium hydroxide (MILK OF MAGNESIA) 400 MG/5ML suspension Take 30 mLs by mouth daily as needed for mild constipation.    Historical Provider, MD    Allergies  Allergen Reactions  . Aspirin     Stomach pain   . Benadryl [Diphenhydramine Hcl (Sleep)] Hives  . Chlorhexidine     CAUSED SKIN IRRITATION AND BURNING SENSATION    Physical Exam  Vitals  Blood pressure 141/103, pulse 150, temperature 98 F (36.7 C), temperature source Oral, resp. rate 22, height 5\' 6"  (1.676 m), weight 69.9 kg (154 lb 1.6 oz), SpO2 100.00%.   1. General elderly white gentleman looks chronically ill  2. Normal affect and insight, Not Suicidal or Homicidal, Awake Alert, Oriented X 3.  3. No F.N deficits, ALL C.Nerves Intact, Strength 5/5 all 4 extremities, Sensation intact all 4 extremities, Plantars down going.  4. Ears and Eyes appear Normal, Conjunctivae clear, PERRLA. Dry Oral Mucosa.  5. Supple Neck, No JVD, No cervical lymphadenopathy appriciated, No Carotid Bruits.  6. Symmetrical Chest wall movement, Good air movement bilaterally, CTAB.  7. irregular heart rate, tachycardic  8. degrees Bowel Sounds, Abdomen Soft . Surgical sites well-healed.  9.  No Cyanosis, Normal Skin Turgor, No Skin Rash or Bruise.  10. Good muscle tone,  joints appear normal , no effusions,  Normal ROM.  11. No Palpable Lymph Nodes in Neck or Axillae    Data Review  CBC  Recent Labs Lab 01/24/13 2015 01/25/13 0531 01/26/13 0528  WBC 23.7* 18.4* 15.7*  HGB 11.4* 10.2* 10.0*  HCT 34.2* 30.9* 31.4*  PLT 574* 471* 432*  MCV 90.0 90.4 92.1  MCH 30.0 29.8 29.3  MCHC 33.3 33.0 31.8  RDW 13.9 13.9 14.1   ------------------------------------------------------------------------------------------------------------------  Chemistries   Recent Labs Lab 01/24/13  2015 01/25/13 0531 01/26/13 0528 01/27/13 0546  NA 130* 134* 133* 135*  K 5.0 4.6 4.5 4.5  CL 96 98 97 100  CO2 22 25 23 23   GLUCOSE 144* 125* 121* 118*  BUN 18 18 12 12   CREATININE 1.25 1.58* 1.58* 1.45*  CALCIUM 8.6 8.1* 7.9* 8.0*    Urinalysis No results found for this basename: colorurine, appearanceur, labspec, phurine, glucoseu, hgbur, bilirubinur, ketonesur, proteinur, urobilinogen, nitrite, leukocytesur    ----------------------------------------------------------------------------------------------------------------     Imaging results:   Nm Pet Image Initial (pi) Skull Base To Thigh  01/13/2013   CLINICAL DATA:  Initial treatment strategy for transitional cell carcinoma.  EXAM: NUCLEAR MEDICINE PET SKULL BASE TO THIGH  FASTING BLOOD GLUCOSE:  Value: 88mg /dl  TECHNIQUE: 17.4 mCi F-18 FDG was injected intravenously. CT data was obtained and used for attenuation correction and anatomic localization only. (This was not acquired as a diagnostic CT examination.) Additional exam technical data entered on technologist worksheet.  COMPARISON:  CT 11/04/2012  FINDINGS: NECK  No hypermetabolic lymph nodes in the neck.  CHEST  Small pleural-based nodule at the medial right lung base measures 7 mm not significant changed 6 mm on prior. No metabolic activity associated the small nodule. No hypermetabolic mediastinal lymph nodes. Large hiatal hernia is noted. Physiologic uptake noted within the stomach.   ABDOMEN/PELVIS  There is right hydronephrosis similar to slightly worsened on comparison exam. Mild hydroureter on the right. The collecting systems and renal hilum on the right are not well evaluated by FDG PET imaging due to the high activity of the excreted FDG.  No evidence of metastatic adenopathy in the abdomen or pelvis. No abnormal activity within the liver. No abnormal activity in the pelvis.  There is significant sigmoid diverticulosis.  SKELETON  No focal hypermetabolic activity to suggest skeletal metastasis.  IMPRESSION: 1. No evidence of metastatic transitional cell carcinoma.  . 2. Right hydronephrosis and hydroureter are slightly worsened than comparison exam. 3. Stable small right lower lobe pulmonary nodule.   Electronically Signed   By: Suzy Bouchard M.D.   On: 01/13/2013 16:15   Dg C-arm 1-60 Min-no Report  01/24/2013   CLINICAL DATA: stent   C-ARM 1-60 MINUTES  Fluoroscopy was utilized by the requesting physician.  No radiographic  interpretation.     My personal review of EKG: A. fib with rapid ventricular rate, old inferior MI    Assessment & Plan  1. atrial fib with rapid ventricular rate; heart rate is still in the 150s on Cardizem drip at 15 mg per hour, I started to IV Lopressor when necessary, will start by mouth Lopressor. Check echo, check TSH 2. Hypertension; seems to be controlled 3. Ureteral cancer status post nephro ureterectomy on 12/29 4. Coronary artery disease status post MI in 1995 status post CABG in 1999 5. Anxiety 6. Hyperlipidemia 7. GERD  Plan: IV Lopressor every 2 hours when necessary By mouth Lopressor Check echo and TSH IV hydration Ativan by mouth Change heparin to treatment those   DVT Prophylaxis Heparin  AM Labs Ordered, also please review Full Orders  Family Communication: Admission, patients condition and plan of care including tests being ordered have been discussed with the patient who indicates understanding and agrees with  the plan and Code Status.  Code Status full  Disposition Plan: Home with home health  Time spent in minutes : ICU time 45 minutes  Condition GUARDED

## 2013-01-27 NOTE — Progress Notes (Signed)
3 Days Post-Op Subjective: Patient reports ongoing issues with constipation. Pain is under good control. He has been tolerating his diet okay but intake has been poor. Only so-so fluid intake. Urine output has remained borderline. Creatinine however is slightly improved/stable. The patient has ambulated. He has no additional concerns at this time.  Objective: Vital signs in last 24 hours: Temp:  [98 F (36.7 C)-98.1 F (36.7 C)] 98 F (36.7 C) (01/01 0521) Pulse Rate:  [98-108] 98 (01/01 0521) Resp:  [20] 20 (01/01 0521) BP: (120-145)/(51-81) 135/68 mmHg (01/01 0521) SpO2:  [94 %-95 %] 95 % (01/01 0521)  Intake/Output from previous day: 12/31 0701 - 01/01 0700 In: 2024.2 [P.O.:960; I.V.:1064.2] Out: 920 [Urine:900; Drains:20] Intake/Output this shift: Total I/O In: -  Out: 10 [Drains:10]  Physical Exam:  Constitutional: Vital signs reviewed. WD WN in NAD   Eyes: PERRL, No scleral icterus.   Cardiovascular: RRR Pulmonary/Chest: Normal effort Abdominal: Soft. No significant distention Genitourinary: Foley draining clear urine. Extremities: No cyanosis or edema   Lab Results:  Recent Labs  01/24/13 2015 01/25/13 0531 01/26/13 0528  HGB 11.4* 10.2* 10.0*  HCT 34.2* 30.9* 31.4*   BMET  Recent Labs  01/26/13 0528 01/27/13 0546  NA 133* 135*  K 4.5 4.5  CL 97 100  CO2 23 23  GLUCOSE 121* 118*  BUN 12 12  CREATININE 1.58* 1.45*  CALCIUM 7.9* 8.0*   No results found for this basename: LABPT, INR,  in the last 72 hours No results found for this basename: LABURIN,  in the last 72 hours No results found for this or any previous visit.  Studies/Results: No results found.  Assessment/Plan:   Doing well postoperative day 3. Will work at constipation issues. Continued ambulation. Probable JP removal on Friday per Dr. Louis Meckel. Probable discharge tomorrow.   LOS: 3 days   Treshon Stannard S 01/27/2013, 8:53 AM

## 2013-01-27 NOTE — Progress Notes (Signed)
Physical Therapy Treatment Patient Details Name: Angel Cooper MRN: [id number] DOB: 1930/02/05 Today's Date: 01/27/2013 Time: 1020-1033 PT Time Calculation (min): 13 min  PT Assessment / Plan / Recommendation  History of Present Illness 80 yo male s/p robotic assisted lap nephro ureterectomy. Hx of MI, anxiety.    PT Comments   Progressing with mobility. Pt having some difficulty with abdominal pain today. Also reports feeling tired, drowsy.   Follow Up Recommendations  Home health PT;Supervision/Assistance - 24 hour     Does the patient have the potential to tolerate intense rehabilitation     Barriers to Discharge        Equipment Recommendations  None recommended by PT    Recommendations for Other Services OT consult  Frequency Min 3X/week   Progress towards PT Goals Progress towards PT goals: Progressing toward goals  Plan Current plan remains appropriate    Precautions / Restrictions Precautions Precautions: Fall Precaution Comments: R drain Restrictions Weight Bearing Restrictions: No   Pertinent Vitals/Pain Abdominal pain 5/10. Discomfort from needing to use bathroom    Mobility  Bed Mobility Bed Mobility: Not assessed Details for Bed Mobility Assistance: pt sitting in recliner Transfers Transfers: Sit to Stand;Stand to Sit Sit to Stand: 5: Supervision;From chair/3-in-1 Stand to Sit: 5: Supervision;To chair/3-in-1 Ambulation/Gait Ambulation/Gait Assistance: 4: Min guard Ambulation Distance (Feet): 200 Feet Assistive device: Rolling walker Ambulation/Gait Assistance Details: slow gait speed.  Gait Pattern: Step-through pattern;Trunk flexed    Exercises     PT Diagnosis:    PT Problem List:   PT Treatment Interventions:     PT Goals (current goals can now be found in the care plan section)    Visit Information  Last PT Received On: 01/27/13 Assistance Needed: +1 History of Present Illness: 80 yo male s/p robotic assisted lap nephro ureterectomy.  Hx of MI, anxiety.     Subjective Data      Cognition  Cognition Arousal/Alertness: Awake/alert Behavior During Therapy: WFL for tasks assessed/performed Overall Cognitive Status: Within Functional Limits for tasks assessed    Balance     End of Session PT - End of Session Activity Tolerance: Patient limited by fatigue;Patient limited by pain Patient left: in chair;with call bell/phone within reach   GP     Weston Anna, MPT Pager: [phone]

## 2013-01-28 DIAGNOSIS — I4891 Unspecified atrial fibrillation: Secondary | ICD-10-CM

## 2013-01-28 DIAGNOSIS — N1832 Chronic kidney disease, stage 3b: Secondary | ICD-10-CM

## 2013-01-28 DIAGNOSIS — I369 Nonrheumatic tricuspid valve disorder, unspecified: Secondary | ICD-10-CM

## 2013-01-28 DIAGNOSIS — N183 Chronic kidney disease, stage 3 unspecified: Secondary | ICD-10-CM

## 2013-01-28 DIAGNOSIS — I25810 Atherosclerosis of coronary artery bypass graft(s) without angina pectoris: Secondary | ICD-10-CM

## 2013-01-28 DIAGNOSIS — I2510 Atherosclerotic heart disease of native coronary artery without angina pectoris: Secondary | ICD-10-CM

## 2013-01-28 HISTORY — PX: TRANSTHORACIC ECHOCARDIOGRAM: SHX275

## 2013-01-28 LAB — CBC
HEMATOCRIT: 32.3 % — AB (ref 39.0–52.0)
Hemoglobin: 10.7 g/dL — ABNORMAL LOW (ref 13.0–17.0)
MCH: 29.9 pg (ref 26.0–34.0)
MCHC: 33.1 g/dL (ref 30.0–36.0)
MCV: 90.2 fL (ref 78.0–100.0)
Platelets: 569 10*3/uL — ABNORMAL HIGH (ref 150–400)
RBC: 3.58 MIL/uL — AB (ref 4.22–5.81)
RDW: 14 % (ref 11.5–15.5)
WBC: 15.4 10*3/uL — AB (ref 4.0–10.5)

## 2013-01-28 LAB — MAGNESIUM: Magnesium: 1.8 mg/dL (ref 1.5–2.5)

## 2013-01-28 LAB — BASIC METABOLIC PANEL
BUN: 12 mg/dL (ref 6–23)
CHLORIDE: 97 meq/L (ref 96–112)
CO2: 21 mEq/L (ref 19–32)
Calcium: 8.1 mg/dL — ABNORMAL LOW (ref 8.4–10.5)
Creatinine, Ser: 1.31 mg/dL (ref 0.50–1.35)
GFR calc Af Amer: 58 mL/min — ABNORMAL LOW (ref >90)
GFR calc non Af Amer: 50 mL/min — ABNORMAL LOW (ref >90)
Glucose, Bld: 97 mg/dL (ref 70–99)
Potassium: 4.4 mEq/L (ref 3.7–5.3)
Sodium: 130 mEq/L — ABNORMAL LOW (ref 137–147)

## 2013-01-28 LAB — URINALYSIS W MICROSCOPIC + REFLEX CULTURE
Bilirubin Urine: NEGATIVE
Glucose, UA: NEGATIVE mg/dL
Ketones, ur: NEGATIVE mg/dL
Nitrite: NEGATIVE
Protein, ur: 100 mg/dL — AB
SPECIFIC GRAVITY, URINE: 1.025 (ref 1.005–1.030)
Urobilinogen, UA: 0.2 mg/dL (ref 0.0–1.0)
pH: 6 (ref 5.0–8.0)

## 2013-01-28 LAB — TSH: TSH: 3.255 u[IU]/mL (ref 0.350–4.500)

## 2013-01-28 LAB — CREATININE, FLUID (PLEURAL, PERITONEAL, JP DRAINAGE): Creat, Fluid: 1.3 mg/dL

## 2013-01-28 MED ORDER — MAGNESIUM HYDROXIDE 400 MG/5ML PO SUSP
15.0000 mL | Freq: Every day | ORAL | Status: DC
  Administered 2013-01-28: 15 mL via ORAL
  Filled 2013-01-28: qty 30

## 2013-01-28 MED ORDER — METOCLOPRAMIDE HCL 5 MG/ML IJ SOLN
5.0000 mg | Freq: Four times a day (QID) | INTRAMUSCULAR | Status: DC
  Administered 2013-01-28 – 2013-01-30 (×8): 5 mg via INTRAVENOUS
  Filled 2013-01-28: qty 1
  Filled 2013-01-28: qty 2
  Filled 2013-01-28: qty 1
  Filled 2013-01-28 (×2): qty 2
  Filled 2013-01-28 (×4): qty 1
  Filled 2013-01-28: qty 2
  Filled 2013-01-28 (×2): qty 1

## 2013-01-28 MED ORDER — SODIUM CHLORIDE 0.9 % IV SOLN
INTRAVENOUS | Status: DC
Start: 2013-01-28 — End: 2013-01-29
  Administered 2013-01-28 – 2013-01-29 (×2): via INTRAVENOUS

## 2013-01-28 NOTE — Progress Notes (Signed)
Urology Inpatient Progress Report  Intv/Subj: Was given enema yesterday PM and then had struggle to bathroom - messed up his gown, etc. Afib with RVR followed this event - was in afib for approximately 3 1/2 hours and then converted back to NSR with aid of lopressor.  ECG showed borderline ST depressions.  No symptoms of chest pain, AMS, weakness/tingling. Transferred to step-down for closer monitoring.  No issues since transfer.  Had another BM this AM, albeit small.  Still complaining of nausea and no appetite. UOP has been marginal - labs pending. Has been doing a great job of staying out of bed/ambulating.   Objective: Vital: Filed Vitals:   01/28/13 0600 01/28/13 0601 01/28/13 0700 01/28/13 0800  BP: 129/70 129/70 130/65 149/66  Pulse: 74 94 69 80  Temp:      TempSrc:      Resp: 17  25 23   Height:      Weight:      SpO2: 99%  95% 97%   I/Os: I/O last 3 completed shifts: In: 2579.6 [P.O.:780; I.V.:1799.6] Out: 70 [Urine:950; Drains:335]  Past Medical History  Diagnosis Date  . Hypertension   . Hypercholesterolemia   . Myocardial infarction 1995  . Shortness of breath     occasionally with exertion  . GERD (gastroesophageal reflux disease)   . H/O hiatal hernia   . Arthritis   . H/O urinary frequency     with nocturia  . Coronary artery disease     NO LONGER SEES CARDIOLOGIST - LAST SAW DR. [name] 2 OR MORE YRS AGO-PT SEES MEDICAL DR Maxwell Caul AT LEAST ONCE A  YEAR  . Pain     BACK PAIN - PT STATES HIS PAIN MAY BE COMING FROM THE URETERAL STENT  . Cancer   . Rash     Dr [place] Crane- scattered over body, reddened areas under the skin   Current Facility-Administered Medications  Medication Dose Route Frequency Provider Last Rate Last Dose  . acetaminophen (TYLENOL) tablet 1,000 mg  1,000 mg Oral Q6H [name], MD   1,000 mg at 01/28/13 0400  . bisacodyl (DULCOLAX) suppository 10 mg  10 mg Rectal Daily PRN [name], MD   10 mg at 01/26/13 1924  .  calcium carbonate (TUMS - dosed in mg elemental calcium) chewable tablet 800 mg of elemental calcium  800 mg of elemental calcium Oral BID [name], MD   800 mg of elemental calcium at 01/27/13 1049  . dextrose 5 % and 0.45 % NaCl with KCl 10 mEq/L infusion   Intravenous Continuous [name], MD      . docusate sodium (COLACE) capsule 100 mg  100 mg Oral BID [name], MD   100 mg at 01/27/13 2251  . esomeprazole (NEXIUM) capsule 40 mg  40 mg Oral Daily [name], MD   40 mg at 01/27/13 1050  . feeding supplement (RESOURCE BREEZE) (RESOURCE BREEZE) liquid 1 Container  1 Container Oral BID BM Baird Lyons, RD   1 Container at 01/27/13 1336  . heparin injection 5,000 Units  5,000 Units Subcutaneous Q8H Merton Border, MD   5,000 Units at 01/28/13 0600  . HYDROmorphone (DILAUDID) injection 0.5-1 mg  0.5-1 mg Intravenous Q2H PRN [name], MD   1 mg at 01/27/13 1554  . magnesium hydroxide (MILK OF MAGNESIA) suspension 15 mL  15 mL Oral Daily [name], MD      . magnesium hydroxide (MILK  OF MAGNESIA) suspension 30 mL  30 mL Oral Daily PRN Bernestine Amass, MD   30 mL at 01/27/13 1049  . metoCLOPramide (REGLAN) injection 5 mg  5 mg Intravenous Q6H [name], MD      . metoprolol (LOPRESSOR) injection 2.5 mg  2.5 mg Intravenous Q2H PRN Merton Border, MD   2.5 mg at 01/27/13 2200  . metoprolol tartrate (LOPRESSOR) tablet 25 mg  25 mg Oral Q8H Merton Border, MD   25 mg at 01/28/13 0602  . neomycin-bacitracin-polymyxin (NEOSPORIN) ointment 1 application  1 application Topical TID PRN [name], MD      . ondansetron [facility]) injection 4 mg  4 mg Intravenous Q4H PRN [name], MD   4 mg at 01/27/13 1343  . opium-belladonna (B&O SUPPRETTES) suppository 1 suppository  1 suppository Rectal Q6H PRN [name], MD      . oxyCODONE (Oxy IR/ROXICODONE) immediate release tablet 5-10 mg  5-10 mg Oral Q4H PRN [name], MD   5 mg  at 01/27/13 1343  . zolpidem (AMBIEN) tablet 5 mg  5 mg Oral QHS PRN [name], MD   5 mg at 01/26/13 2106    Physical Exam:  General: Patient is in no apparent distress Lungs: Normal respiratory effort, chest expands symmetrically. GI: The abdomen is soft and appropirately ender, incisions c/d/i, ecchymosis on the superior aspect of the lowest midline incision.  There is some body wall edema. Drain with serosanguinous output Ext: lower extremities symmetric  Lab Results:  Recent Labs  01/26/13 0528  WBC 15.7*  HGB 10.0*  HCT 31.4*    Recent Labs  01/26/13 0528 01/27/13 0546  NA 133* 135*  K 4.5 4.5  CL 97 100  CO2 23 23  GLUCOSE 121* 118*  BUN 12 12  CREATININE 1.58* 1.45*  CALCIUM 7.9* 8.0*   No results found for this basename: LABPT, INR,  in the last 72 hours No results found for this basename: LABURIN,  in the last 72 hours Results for orders placed during the hospital encounter of 01/24/13  MRSA PCR SCREENING     Status: None   Collection Time    01/27/13  9:43 PM      Result Value Range Status   MRSA by PCR NEGATIVE  NEGATIVE Final   Comment:            The GeneXpert MRSA Assay (FDA     approved for NASAL specimens     only), is one component of a     comprehensive MRSA colonization     surveillance program. It is not     intended to diagnose MRSA     infection nor to guide or     monitor treatment for     MRSA infections.    Studies/Results: none  Assessment/Plan: 4 Days Post-Op s/p Robotic Assisted Lap Nephro-ureterectomy and pelvic node dissection. 1. Afib with RVR, converted back to NSR.  ECG and ECHO, lytes, TSH pending.  Would prefer to hold on full anticoagulation for at least 1 wk post-op.  ASA okay. Agree with lopressor.  Appreciate Medicine input. 2. Poor appetitie/nausea - start scheduled Reglan and MOM.  Encourage protein shakes and PO intake. 3. Surgical wounds look great.  Will send JP for creatinine today - d/c if consistent  with serum. 4. Continue DVT prophylaxis 5. Continue PT, encourage OOB/Ambulation 6. Potentially transfer patient back to Med-surg floor. 7. Home after he remains in NSR  x 24 hrs, tolerating regular diet, up and ambulating and bowels moving consistently.      [name] 01/28/2013, 8:57 AM

## 2013-01-28 NOTE — Progress Notes (Signed)
Physical Therapy Treatment Patient Details Name: Angel Cooper MRN: [id number] DOB: [birth year] Today's Date: 01/28/2013 Time: 0932-3557 PT Time Calculation (min): 19 min  PT Assessment / Plan / Recommendation  History of Present Illness 80 yo male s/p robotic assisted lap nephro ureterectomy. Hx of MI, anxiety.    PT Comments   Pt progressing; transferred to SDU yesterday  Due to afib and RVR; now in NSR  Follow Up Recommendations  Home health PT;Supervision/Assistance - 24 hour     Does the patient have the potential to tolerate intense rehabilitation     Barriers to Discharge        Equipment Recommendations  None recommended by PT    Recommendations for Other Services    Frequency Min 3X/week   Progress towards PT Goals Progress towards PT goals: Progressing toward goals  Plan Current plan remains appropriate    Precautions / Restrictions Precautions Precautions: Fall Precaution Comments: JP drain pulled earlier and leaking; RN aware and dressingchanged   Pertinent Vitals/Pain VSS    Mobility  Bed Mobility Bed Mobility: Not assessed Transfers Transfers: Sit to Stand;Stand to Sit Sit to Stand: 5: Supervision;From chair/3-in-1 Stand to Sit: 5: Supervision;To chair/3-in-1 Details for Transfer Assistance: verbal cues for hand placement and safety Ambulation/Gait Ambulation/Gait Assistance: 4: Min guard Ambulation Distance (Feet): 200 Feet Assistive device: Rolling walker Ambulation/Gait Assistance Details: incr time; cues for RW safety and  posture Gait Pattern: Step-through pattern;Trunk flexed    Exercises     PT Diagnosis:    PT Problem List:   PT Treatment Interventions:     PT Goals (current goals can now be found in the care plan section) Acute Rehab PT Goals Patient Stated Goal: less pain. home.  PT Goal Formulation: With patient/family Time For Goal Achievement: 02/08/13 Potential to Achieve Goals: Good  Visit Information  Last PT Received On:  01/28/13 Assistance Needed: +1 History of Present Illness: 80 yo male s/p robotic assisted lap nephro ureterectomy. Hx of MI, anxiety.     Subjective Data  Patient Stated Goal: less pain. home.    Cognition  Cognition Arousal/Alertness: Awake/alert Behavior During Therapy: WFL for tasks assessed/performed Overall Cognitive Status: Within Functional Limits for tasks assessed    Balance     End of Session PT - End of Session Equipment Utilized During Treatment: Gait belt Activity Tolerance: Patient tolerated treatment well Patient left: in chair;with call bell/phone within reach Nurse Communication: Mobility status   GP     [facility] 01/28/2013, 5:04 PM

## 2013-01-28 NOTE — Progress Notes (Signed)
  Echocardiogram 2D Echocardiogram has been performed.  Angel Cooper 01/28/2013, 2:21 PM

## 2013-01-28 NOTE — Progress Notes (Signed)
CARE MANAGEMENT NOTE 01/28/2013  Patient:  Angel Cooper, Angel Cooper   Account Number:  [id number]  Date Initiated:  01/25/2013  Documentation initiated by:  Dessa Phi  Subjective/Objective Assessment:   80 Y/O M ADMITTED W/URETERAL CANCER.     Action/Plan:   FROM HOME.   Anticipated DC Date:  01/31/2013   Anticipated DC Plan:  [place]  CM consult      Choice offered to / List presented to:  C-1 Patient           Status of service:  In process, will continue to follow Medicare Important Message given?   (If response is "NO", the following Medicare IM given date fields will be blank) Date Medicare IM given:   Date Additional Medicare IM given:    Discharge Disposition:    Per UR Regulation:  Reviewed for med. necessity/level of care/duration of stay  If discussed at [place] of Stay Meetings, dates discussed:    Comments:  [id number]/Darden Flemister,RN,BSN,CCM/patient transferred to 1224 due to new onset of a.fib and requiring iv cardizem drip.  Day 1 in sdu.  01/25/13 KATHY MAHABIR RN,BSN NCM [phone] [facility] CHOSEN FOR HHPT,TC KRISTEN AWARE OF REFERRAL.AWAIT FINAL HHPT ORDER. PROVIDED [facility] AGENCY LIST.AWAIT FINAL HHPT ORDER.

## 2013-01-28 NOTE — Significant Event (Signed)
Rapid Response Event Note  Overview: Time Called: 2015 Event Type: Cardiac  Initial Focused Assessment: Pt 3 days post op nephrectomy suddenly with increased heart rate. A-fib 150-180. Dr. Risa Grill notified and cardiologist called by Dr. Risa Grill. [facility] hospitalist Dr. Thereasa Solo called who gave orders for transfer and cardizem.    Interventions:ekg done, O2 put on Carizem drip started with 15mg  bolus and pt transferred to 1224    Event Summary:Dr. Hijazi saw pt in the unit after transfer and gave orders for lopressor. Name of Physician Notified: Dr. Risa Grill at 2000  Name of Consulting Physician Notified: [facility] at 2130  Outcome: Transferred (Comment)  Event End Time: 2130  Pricilla Riffle

## 2013-01-28 NOTE — Progress Notes (Signed)
[facility] PROGRESS NOTE  Angel Cooper BDZ:[id number] DOB: Sep 12, 1930 DOA: 01/24/2013 PCP: Horton Finer, MD  Brief history 80 year old male with a history of hypertension, coronary artery disease with history of myocardial infarction, GERD, and right urothelial carcinoma diagnosed on CT on 11/04/2012 presented to the hospital on 01/24/2013 for elective surgery. He underwent right distal ureterectomy, right radical nephrectomy, and right ureteral stent placement on 01/24/2013. He had a benign postoperative course. On the evening of 01/27/2013 after the patient had an enema and subsequent bowel movement, the patient developed atrial fibrillation with rapid ventricular response. His heart rate was up to the 160s. The patient was transferred to step down and started on a diltiazem drip. He spontaneously converted back to sinus rhythm. The patient was started on metoprolol tartrate po.  Hospitalist asked to consult for medical management. The patient had denied any dizziness, chest discomfort, shortness of breath associated with atrial fibrillation. Assessment/Plan: Atrial fibrillation with rapid ventricular response -Has converted back to sinus rhythm spontaneously -Continue metoprolol tartrate -CHADSVASc= 4 which is associated with approx. 5% risk of stroke per year -I have discussed the risks, benefits, and alternatives of anticoagulation with warfarin versus antiplatelet therapy with the patient -The patient adamantly does not want to start any anticoagulation or antiplatelet therapy at this time--he expressed understanding of his risk of stroke -Echocardiogram -Cycle troponins -Check BMP and mg, optimize electrolytes -case discussed with Dr. Louis Meckel today Coronary artery disease with history of MI -Coronary artery bypass graft in 2000 -Patient refuses antiplatelet therapy after discussion of the risks, benefits, and alternatives  hypertension  -Discontinue enalapril  altogether due to his CKD and nephrectomy -continue metoprolol tartrate CKD stage III  -Baseline creatinine 1.1-1.4  Urothelial carcinoma--right ureter  -per Dr. Louis Meckel Normocytic anemia  -Likely anemia of chronic disease  -Hemoglobin stable  -Outpatient workup   Family Communication:   Pt at beside Disposition Plan:   Home when medically stable       Procedures/Studies: Nm Pet Image Initial (pi) Skull Base To Thigh  01/13/2013   CLINICAL DATA:  Initial treatment strategy for transitional cell carcinoma.  EXAM: NUCLEAR MEDICINE PET SKULL BASE TO THIGH  FASTING BLOOD GLUCOSE:  Value: 88mg /dl  TECHNIQUE: 17.4 mCi F-18 FDG was injected intravenously. CT data was obtained and used for attenuation correction and anatomic localization only. (This was not acquired as a diagnostic CT examination.) Additional exam technical data entered on technologist worksheet.  COMPARISON:  CT 11/04/2012  FINDINGS: NECK  No hypermetabolic lymph nodes in the neck.  CHEST  Small pleural-based nodule at the medial right lung base measures 7 mm not significant changed 6 mm on prior. No metabolic activity associated the small nodule. No hypermetabolic mediastinal lymph nodes. Large hiatal hernia is noted. Physiologic uptake noted within the stomach.  ABDOMEN/PELVIS  There is right hydronephrosis similar to slightly worsened on comparison exam. Mild hydroureter on the right. The collecting systems and renal hilum on the right are not well evaluated by FDG PET imaging due to the high activity of the excreted FDG.  No evidence of metastatic adenopathy in the abdomen or pelvis. No abnormal activity within the liver. No abnormal activity in the pelvis.  There is significant sigmoid diverticulosis.  SKELETON  No focal hypermetabolic activity to suggest skeletal metastasis.  IMPRESSION: 1. No evidence of metastatic transitional cell carcinoma.  . 2. Right hydronephrosis and hydroureter are slightly worsened than comparison  exam. 3. Stable small right lower lobe pulmonary nodule.   Electronically Signed  By: Suzy Bouchard M.D.   On: 01/13/2013 16:15   Dg C-arm 1-60 Min-no Report  01/24/2013   CLINICAL DATA: stent   C-ARM 1-60 MINUTES  Fluoroscopy was utilized by the requesting physician.  No radiographic  interpretation.          Subjective: Patient is feeling better today. He denies any dizziness, headache, fevers, chills, chest discomfort, shortness breath, vomiting, diarrhea. He has incisional pain in his abdomen. Has some nausea.   Objective: Filed Vitals:   01/28/13 0601 01/28/13 0700 01/28/13 0800 01/28/13 0900  BP: 129/70 130/65 149/66 160/84  Pulse: 94 69 80 79  Temp:      TempSrc:      Resp:  25 23 17   Height:      Weight:      SpO2:  95% 97% 97%    Intake/Output Summary (Last 24 hours) at 01/28/13 0937 Last data filed at 01/28/13 0900  Gross per 24 hour  Intake 1789.58 ml  Output    880 ml  Net 909.58 ml   Weight change:  Exam:   General:  Pt is alert, follows commands appropriately, not in acute distress  HEENT: No icterus, No thrush, [place]/AT  Cardiovascular: RRR, S1/S2, no rubs, no gallops  Respiratory: bibasilar crackles. No wheezing. Good air movement.   Abdomen: Soft/+BS, mild tenderness on his incisions without any peritoneal signs, non distended, no guarding  Extremities: No edema, No lymphangitis, No petechiae, No rashes, no synovitis  Data Reviewed: Basic Metabolic Panel:  Recent Labs Lab 01/24/13 2015 01/25/13 0531 01/26/13 0528 01/27/13 0546  NA 130* 134* 133* 135*  K 5.0 4.6 4.5 4.5  CL 96 98 97 100  CO2 22 25 23 23   GLUCOSE 144* 125* 121* 118*  BUN 18 18 12 12   CREATININE 1.25 1.58* 1.58* 1.45*  CALCIUM 8.6 8.1* 7.9* 8.0*   Liver Function Tests: No results found for this basename: AST, ALT, ALKPHOS, BILITOT, PROT, ALBUMIN,  in the last 168 hours No results found for this basename: LIPASE, AMYLASE,  in the last 168 hours No results found for  this basename: AMMONIA,  in the last 168 hours CBC:  Recent Labs Lab 01/24/13 2015 01/25/13 0531 01/26/13 0528 01/28/13 0902  WBC 23.7* 18.4* 15.7* 15.4*  HGB 11.4* 10.2* 10.0* 10.7*  HCT 34.2* 30.9* 31.4* 32.3*  MCV 90.0 90.4 92.1 90.2  PLT 574* 471* 432* 569*   Cardiac Enzymes: No results found for this basename: CKTOTAL, CKMB, CKMBINDEX, TROPONINI,  in the last 168 hours BNP: No components found with this basename: POCBNP,  CBG: No results found for this basename: GLUCAP,  in the last 168 hours  Recent Results (from the past 240 hour(s))  MRSA PCR SCREENING     Status: None   Collection Time    01/27/13  9:43 PM      Result Value Range Status   MRSA by PCR NEGATIVE  NEGATIVE Final   Comment:            The GeneXpert MRSA Assay (FDA     approved for NASAL specimens     only), is one component of a     comprehensive MRSA colonization     surveillance program. It is not     intended to diagnose MRSA     infection nor to guide or     monitor treatment for     MRSA infections.     Scheduled Meds: . acetaminophen  1,000 mg Oral Q6H  .  calcium carbonate  800 mg of elemental calcium Oral BID  . docusate sodium  100 mg Oral BID  . esomeprazole  40 mg Oral Daily  . feeding supplement (RESOURCE BREEZE)  1 Container Oral BID BM  . heparin subcutaneous  5,000 Units Subcutaneous Q8H  . magnesium hydroxide  15 mL Oral Daily  . metoCLOPramide (REGLAN) injection  5 mg Intravenous Q6H  . metoprolol tartrate  25 mg Oral Q8H   Continuous Infusions: . dextrose 5 % and 0.45 % NaCl with KCl 10 mEq/L 75 mL/hr at 01/28/13 0930     Angelly Spearing, DO  [facility] Pager [phone]  If 7PM-7AM, please contact night-coverage [email or web address] Password [facility] 01/28/2013, 9:37 AM   LOS: 4 days

## 2013-01-29 LAB — BASIC METABOLIC PANEL
BUN: 13 mg/dL (ref 6–23)
CHLORIDE: 99 meq/L (ref 96–112)
CO2: 20 meq/L (ref 19–32)
Calcium: 7.9 mg/dL — ABNORMAL LOW (ref 8.4–10.5)
Creatinine, Ser: 1.31 mg/dL (ref 0.50–1.35)
GFR calc non Af Amer: 50 mL/min — ABNORMAL LOW (ref >90)
GFR, EST AFRICAN AMERICAN: 58 mL/min — AB (ref >90)
GLUCOSE: 91 mg/dL (ref 70–99)
POTASSIUM: 4.2 meq/L (ref 3.7–5.3)
Sodium: 132 mEq/L — ABNORMAL LOW (ref 137–147)

## 2013-01-29 LAB — CBC
HCT: 31.1 % — ABNORMAL LOW (ref 39.0–52.0)
HEMOGLOBIN: 10.1 g/dL — AB (ref 13.0–17.0)
MCH: 29.4 pg (ref 26.0–34.0)
MCHC: 32.5 g/dL (ref 30.0–36.0)
MCV: 90.4 fL (ref 78.0–100.0)
Platelets: 534 10*3/uL — ABNORMAL HIGH (ref 150–400)
RBC: 3.44 MIL/uL — ABNORMAL LOW (ref 4.22–5.81)
RDW: 14.2 % (ref 11.5–15.5)
WBC: 13.9 10*3/uL — ABNORMAL HIGH (ref 4.0–10.5)

## 2013-01-29 LAB — URINE CULTURE
Colony Count: NO GROWTH
Culture: NO GROWTH

## 2013-01-29 MED ORDER — METOPROLOL TARTRATE 50 MG PO TABS
50.0000 mg | ORAL_TABLET | Freq: Two times a day (BID) | ORAL | Status: DC
  Administered 2013-01-29 – 2013-01-30 (×2): 50 mg via ORAL
  Filled 2013-01-29 (×3): qty 1

## 2013-01-29 MED ORDER — CLOPIDOGREL BISULFATE 75 MG PO TABS
75.0000 mg | ORAL_TABLET | Freq: Every day | ORAL | Status: DC
  Administered 2013-01-30: 75 mg via ORAL
  Filled 2013-01-29 (×3): qty 1

## 2013-01-29 MED ORDER — CLOPIDOGREL BISULFATE 75 MG PO TABS: 75.0000 mg | ORAL_TABLET | Freq: Every day | ORAL | Status: DC

## 2013-01-29 MED ORDER — METOPROLOL TARTRATE 25 MG PO TABS: 25.0000 mg | ORAL_TABLET | Freq: Two times a day (BID) | ORAL | Status: DC

## 2013-01-29 MED ORDER — METOPROLOL TARTRATE 50 MG PO TABS: 50.0000 mg | ORAL_TABLET | Freq: Two times a day (BID) | ORAL | Status: DC

## 2013-01-29 NOTE — Progress Notes (Signed)
[facility] PROGRESS NOTE  MILFORD CILENTO MWN:[id number] DOB: 07/29/1930 DOA: 01/24/2013 PCP: Horton Finer, MD  Assessment/Plan: Atrial fibrillation with rapid ventricular response  -Has converted back to sinus rhythm spontaneously  -home with metoprolol tartrate 50mg  bid -CHADSVASc= 4 which is associated with approx. 5% risk of stroke per year  -I have discussed the risks, benefits, and alternatives of anticoagulation with warfarin versus antiplatelet therapy with the patient  -The patient adamantly does not want to start any anticoagulation at this time--he expressed understanding of his risk of stroke  -After repeat discussion of the risks, benefits, and alternatives, the patient is willing to start Plavix -home with plavix 75mg  daily -Echocardiogram--EF 40-34%, grade 1 diastolic dysfunction, diffuse HK -K= 4.2, mag 1.8 -case discussed with Dr. Louis Meckel  Coronary artery disease with history of MI  -Coronary artery bypass graft in 2000  -Patient refuses antiplatelet therapy after discussion of the risks, benefits, and alternatives  hypertension  -Discontinue enalapril altogether due to his CKD and nephrectomy  -continue metoprolol tartrate  CKD stage III  -Baseline creatinine 1.1-1.4  Urothelial carcinoma--right ureter  -per Dr. Louis Meckel  Normocytic anemia  -Likely anemia of chronic disease  -Hemoglobin stable  -Outpatient workup  Leukocytosis -Likely reactive due to the patient's acute medical condition -Improving without any antibiotics -Afebrile and hemodynamically stable  Procedures/Studies: Nm Pet Image Initial (pi) Skull Base To Thigh  01/13/2013   CLINICAL DATA:  Initial treatment strategy for transitional cell carcinoma.  EXAM: NUCLEAR MEDICINE PET SKULL BASE TO THIGH  FASTING BLOOD GLUCOSE:  Value: 88mg /dl  TECHNIQUE: 17.4 mCi F-18 FDG was injected intravenously. CT data was obtained and used for attenuation correction and anatomic localization only.  (This was not acquired as a diagnostic CT examination.) Additional exam technical data entered on technologist worksheet.  COMPARISON:  CT 11/04/2012  FINDINGS: NECK  No hypermetabolic lymph nodes in the neck.  CHEST  Small pleural-based nodule at the medial right lung base measures 7 mm not significant changed 6 mm on prior. No metabolic activity associated the small nodule. No hypermetabolic mediastinal lymph nodes. Large hiatal hernia is noted. Physiologic uptake noted within the stomach.  ABDOMEN/PELVIS  There is right hydronephrosis similar to slightly worsened on comparison exam. Mild hydroureter on the right. The collecting systems and renal hilum on the right are not well evaluated by FDG PET imaging due to the high activity of the excreted FDG.  No evidence of metastatic adenopathy in the abdomen or pelvis. No abnormal activity within the liver. No abnormal activity in the pelvis.  There is significant sigmoid diverticulosis.  SKELETON  No focal hypermetabolic activity to suggest skeletal metastasis.  IMPRESSION: 1. No evidence of metastatic transitional cell carcinoma.  . 2. Right hydronephrosis and hydroureter are slightly worsened than comparison exam. 3. Stable small right lower lobe pulmonary nodule.   Electronically Signed   By: Suzy Bouchard M.D.   On: 01/13/2013 16:15   Dg C-arm 1-60 Min-no Report  01/24/2013   CLINICAL DATA: stent   C-ARM 1-60 MINUTES  Fluoroscopy was utilized by the requesting physician.  No radiographic  interpretation.          Subjective: Patient is feeling better. He denies fevers, chills, chest discomfort, shortness breath, nausea, vomiting, diarrhea, abdominal pain, dysuria, hematuria. No rashes.  Objective: Filed Vitals:   01/29/13 0400 01/29/13 0800 01/29/13 1200 01/29/13 1530  BP: 140/52 156/67 158/69 154/80  Pulse: 80 74 80 87  Temp:  98.4 F (36.9 C) 98.4 F (36.9 C)  97.8 F (36.6 C)  TempSrc:  Oral Oral Oral  Resp: 25 20 14 20   Height:       Weight:      SpO2: 98% 96% 100% 98%    Intake/Output Summary (Last 24 hours) at 01/29/13 1858 Last data filed at 01/29/13 1200  Gross per 24 hour  Intake   1800 ml  Output    375 ml  Net   1425 ml   Weight change:  Exam:   General:  Pt is alert, follows commands appropriately, not in acute distress  HEENT: No icterus, No thrush,  [place]/AT  Cardiovascular: RRR, S1/S2, no rubs, no gallops  Respiratory: CTA bilaterally, no wheezing, no crackles, no rhonchi  Abdomen: Soft/+BS, non tender, non distended, no guarding  Extremities: No edema, No lymphangitis, No petechiae, No rashes, no synovitis  Data Reviewed: Basic Metabolic Panel:  Recent Labs Lab 01/25/13 0531 01/26/13 0528 01/27/13 0546 01/28/13 0902 01/29/13 0345  NA 134* 133* 135* 130* 132*  K 4.6 4.5 4.5 4.4 4.2  CL 98 97 100 97 99  CO2 25 23 23 21 20   GLUCOSE 125* 121* 118* 97 91  BUN 18 12 12 12 13   CREATININE 1.58* 1.58* 1.45* 1.31 1.31  CALCIUM 8.1* 7.9* 8.0* 8.1* 7.9*  MG  --   --   --  1.8  --    Liver Function Tests: No results found for this basename: AST, ALT, ALKPHOS, BILITOT, PROT, ALBUMIN,  in the last 168 hours No results found for this basename: LIPASE, AMYLASE,  in the last 168 hours No results found for this basename: AMMONIA,  in the last 168 hours CBC:  Recent Labs Lab 01/24/13 2015 01/25/13 0531 01/26/13 0528 01/28/13 0902 01/29/13 0345  WBC 23.7* 18.4* 15.7* 15.4* 13.9*  HGB 11.4* 10.2* 10.0* 10.7* 10.1*  HCT 34.2* 30.9* 31.4* 32.3* 31.1*  MCV 90.0 90.4 92.1 90.2 90.4  PLT 574* 471* 432* 569* 534*   Cardiac Enzymes: No results found for this basename: CKTOTAL, CKMB, CKMBINDEX, TROPONINI,  in the last 168 hours BNP: No components found with this basename: POCBNP,  CBG: No results found for this basename: GLUCAP,  in the last 168 hours  Recent Results (from the past 240 hour(s))  MRSA PCR SCREENING     Status: None   Collection Time    01/27/13  9:43 PM      Result Value  Range Status   MRSA by PCR NEGATIVE  NEGATIVE Final   Comment:            The GeneXpert MRSA Assay (FDA     approved for NASAL specimens     only), is one component of a     comprehensive MRSA colonization     surveillance program. It is not     intended to diagnose MRSA     infection nor to guide or     monitor treatment for     MRSA infections.  URINE CULTURE     Status: None   Collection Time    01/28/13 12:04 PM      Result Value Range Status   Specimen Description URINE, CATHETERIZED   Final   Special Requests NONE   Final   Culture  Setup Time     Final   Value: 01/28/2013 18:09     Performed at [facility]     Final   Value: NO GROWTH     Performed at [facility]   Culture     Final   Value: NO GROWTH     Performed at [facility]   Report Status 01/29/2013 FINAL   Final     Scheduled Meds: . acetaminophen  1,000 mg Oral Q6H  . calcium carbonate  800 mg of elemental calcium Oral BID  . docusate sodium  100 mg Oral BID  . esomeprazole  40 mg Oral Daily  . feeding supplement (RESOURCE BREEZE)  1 Container Oral BID BM  . heparin subcutaneous  5,000 Units Subcutaneous Q8H  . magnesium hydroxide  15 mL Oral Daily  . metoCLOPramide (REGLAN) injection  5 mg Intravenous Q6H  . metoprolol tartrate  50 mg Oral BID   Continuous Infusions: . dextrose 5 % and 0.45 % NaCl with KCl 10 mEq/L 75 mL/hr at 01/29/13 0800     Marybella Ethier, DO  [facility] Pager [phone]  If 7PM-7AM, please contact night-coverage [email or web address] Password [email or web address] 01/29/2013, 6:58 PM   LOS: 5 days

## 2013-01-29 NOTE — Progress Notes (Signed)
Urology Inpatient Progress Report  Intv/Subj: Started on reglan and MOM scheduled, is having bowel movements again. Slept in the early PM yesterday, worked with PT and was able to eat some dinner JP drain was removed yesterday. ECHO yesterday - EF - 45-50%  Had good night of rest, feels well this AM Pain well managed on Tylenol No acute events overnight - remains in NSR since 11pm on 01/27/13    Objective: Vital: Filed Vitals:   01/28/13 2200 01/28/13 2218 01/29/13 0000 01/29/13 0400  BP:  131/59 112/45 140/52  Pulse: 81  68 80  Temp:   98.2 F (36.8 C)   TempSrc:   Oral   Resp: 21  20 25   Height:      Weight:      SpO2: 96%  95% 98%   I/Os: I/O last 3 completed shifts: In: 3168.8 [P.O.:900; I.V.:2268.8] Out: 1250 [Urine:800; Drains:450]  Past Medical History  Diagnosis Date  . Hypertension   . Hypercholesterolemia   . Myocardial infarction 1995  . Shortness of breath     occasionally with exertion  . GERD (gastroesophageal reflux disease)   . H/O hiatal hernia   . Arthritis   . H/O urinary frequency     with nocturia  . Coronary artery disease     NO LONGER SEES CARDIOLOGIST - LAST SAW DR. [name] 2 OR MORE YRS AGO-PT SEES MEDICAL DR Maxwell Caul AT LEAST ONCE A  YEAR  . Pain     BACK PAIN - PT STATES HIS PAIN MAY BE COMING FROM THE URETERAL STENT  . Cancer   . Rash     Dr Nevada Crane- scattered over body, reddened areas under the skin   Current Facility-Administered Medications  Medication Dose Route Frequency Provider Last Rate Last Dose  . acetaminophen (TYLENOL) tablet 1,000 mg  1,000 mg Oral Q6H [name], MD   1,000 mg at 01/28/13 2123  . bisacodyl (DULCOLAX) suppository 10 mg  10 mg Rectal Daily PRN [name], MD   10 mg at 01/26/13 1924  . calcium carbonate (TUMS - dosed in mg elemental calcium) chewable tablet 800 mg of elemental calcium  800 mg of elemental calcium Oral BID [name], MD   800 mg of elemental calcium at 01/27/13 1049   . dextrose 5 % and 0.45 % NaCl with KCl 10 mEq/L infusion   Intravenous Continuous [name], MD 75 mL/hr at 01/28/13 0930    . docusate sodium (COLACE) capsule 100 mg  100 mg Oral BID [name], MD   100 mg at 01/28/13 2123  . esomeprazole (NEXIUM) capsule 40 mg  40 mg Oral Daily [name], MD   40 mg at 01/28/13 1023  . feeding supplement (RESOURCE BREEZE) (RESOURCE BREEZE) liquid 1 Container  1 Container Oral BID BM Baird Lyons, RD   1 Container at 01/28/13 1520  . heparin injection 5,000 Units  5,000 Units Subcutaneous Q8H Merton Border, MD   5,000 Units at 01/29/13 0526  . HYDROmorphone (DILAUDID) injection 0.5-1 mg  0.5-1 mg Intravenous Q2H PRN [name], MD   1 mg at 01/27/13 1554  . magnesium hydroxide (MILK OF MAGNESIA) suspension 15 mL  15 mL Oral Daily [name], MD   15 mL at 01/28/13 1022  . magnesium hydroxide (MILK OF MAGNESIA) suspension 30 mL  30 mL Oral Daily PRN Bernestine Amass, MD   30 mL at 01/27/13 1049  . metoCLOPramide (REGLAN)  injection 5 mg  5 mg Intravenous Q6H [name], MD   5 mg at 01/29/13 0526  . metoprolol (LOPRESSOR) injection 2.5 mg  2.5 mg Intravenous Q2H PRN Merton Border, MD   2.5 mg at 01/27/13 2200  . metoprolol tartrate (LOPRESSOR) tablet 25 mg  25 mg Oral Q8H Merton Border, MD   25 mg at 01/29/13 0526  . neomycin-bacitracin-polymyxin (NEOSPORIN) ointment 1 application  1 application Topical TID PRN [name], MD      . ondansetron [facility]) injection 4 mg  4 mg Intravenous Q4H PRN [name], MD   4 mg at 01/27/13 1343  . opium-belladonna (B&O SUPPRETTES) suppository 1 suppository  1 suppository Rectal Q6H PRN [name], MD      . oxyCODONE (Oxy IR/ROXICODONE) immediate release tablet 5-10 mg  5-10 mg Oral Q4H PRN [name], MD   5 mg at 01/27/13 1343  . zolpidem (AMBIEN) tablet 5 mg  5 mg Oral QHS PRN [name], MD   5 mg at 01/28/13 2123    Physical Exam:   General: Patient is in no apparent distress Lungs: Normal respiratory effort, chest expands symmetrically. GI: The abdomen is soft and appropriately tender, incisions c/d/i, sub-q air in right upper quadrant Foley draining clear yellow urine Ext: lower extremities symmetric  Lab Results:  Recent Labs  01/28/13 0902 01/29/13 0345  WBC 15.4* 13.9*  HGB 10.7* 10.1*  HCT 32.3* 31.1*    Recent Labs  01/27/13 0546 01/28/13 0902 01/29/13 0345  NA 135* 130* 132*  K 4.5 4.4 4.2  CL 100 97 99  CO2 23 21 20   GLUCOSE 118* 97 91  BUN 12 12 13   CREATININE 1.45* 1.31 1.31  CALCIUM 8.0* 8.1* 7.9*   No results found for this basename: LABPT, INR,  in the last 72 hours No results found for this basename: LABURIN,  in the last 72 hours Results for orders placed during the hospital encounter of 01/24/13  MRSA PCR SCREENING     Status: None   Collection Time    01/27/13  9:43 PM      Result Value Range Status   MRSA by PCR NEGATIVE  NEGATIVE Final   Comment:            The GeneXpert MRSA Assay (FDA     approved for NASAL specimens     only), is one component of a     comprehensive MRSA colonization     surveillance program. It is not     intended to diagnose MRSA     infection nor to guide or     monitor treatment for     MRSA infections.    Studies/Results: none  Assessment/Plan: 5 Days Post-Op s/p Robotic Assisted Lap Nephro-ureterectomy and pelvic node dissection. 1. Afib with RVR, converted back to NSR. Metoprolol 25mg  TID 2. Poor appetitie/nausea - continue scheduled MOM, Reglan PRN for nausea. Encourage protein shakes and PO intake. 3. Surgical wounds look great.   4. Continue DVT prophylaxis, patient allergic to ASA, does not want full anticoagulation for Afib 5. Continue PT, encourage OOB/Ambulation 6. Transfer patient back to Med-surg floor. 7. Expect d/c tomorrow.     [name] 01/29/2013, 7:36 AM

## 2013-01-29 NOTE — Progress Notes (Signed)
Patient transferring to room 1408.  Report called to Manuela Schwartz, RN.  Patient to travel by wheelchair.  Will continue to monitor.

## 2013-01-29 NOTE — Discharge Summary (Signed)
Date of admission: 01/24/2013  Date of discharge: 01/30/2013  Admission diagnosis: Right upper tract transitional cell carcinoma  Discharge diagnosis:  Right upper tract transitional cell carcinoma Afib Poor nutrition   Secondary diagnoses:  Patient Active Problem List   Diagnosis Date Noted  . Atrial fibrillation with RVR 01/28/2013  . CAD (coronary artery disease) of artery bypass graft 01/28/2013  . CKD (chronic kidney disease) stage 3, GFR 30-59 ml/min 01/28/2013  . Ureteral cancer 01/24/2013    History and Physical: For full details, please see admission history and physical. Briefly, Angel Cooper is a 80 y.o. 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  His disease was becoming progressively symptomatic and as such he opted for surgical resection to help manage his symptoms and treat his disease.  Hospital Course: Patient tolerated the procedure well.  He was then transferred to the floor after an uneventful PACU stay.  His hospital course was complicated by afib with RVR on POD3.  He was treated with IV lopressor and he converted back to NSR after 3 hours.  Patient remained hemodynamically stable  In addition the patient had some poor nutrition, exacerbating his pre-op GI issues. His appetite improved prior to discharge. He was started on Metoprolol 30m BID as well as Plavix 746mdaily for his afib.  On POD#6  he had met discharge criteria: was eating a regular diet, was up and ambulating using a walker,  His pain was well controlled on Tylenol, and was ready to for discharge.   Laboratory values:   Recent Labs  01/28/13 0902 01/29/13 0345  WBC 15.4* 13.9*  HGB 10.7* 10.1*  HCT 32.3* 31.1*    Recent Labs  01/28/13 0902 01/29/13 0345  NA 130* 132*  K 4.4 4.2  CL 97 99  CO2 21 20  GLUCOSE 97 91  BUN 12 13  CREATININE 1.31 1.31  CALCIUM 8.1* 7.9*   No results found for this basename: LABPT, INR,  in the last 72  hours No results found for this basename: LABURIN,  in the last 72 hours Results for orders placed during the hospital encounter of 01/24/13  MRSA PCR SCREENING     Status: None   Collection Time    01/27/13  9:43 PM      Result Value Range Status   MRSA by PCR NEGATIVE  NEGATIVE Final   Comment:            The GeneXpert MRSA Assay (FDA     approved for NASAL specimens     only), is one component of a     comprehensive MRSA colonization     surveillance program. It is not     intended to diagnose MRSA     infection nor to guide or     monitor treatment for     MRSA infections.  URINE CULTURE     Status: None   Collection Time    01/28/13 12:04 PM      Result Value Range Status   Specimen Description URINE, CATHETERIZED   Final   Special Requests NONE   Final   Culture  Setup Time     Final   Value: 01/28/2013 18:09     Performed at [facility]   Final   Value: NO GROWTH     Performed at [facility]     Final   Value: NO  GROWTH     Performed at [facility]   Report Status 01/29/2013 FINAL   Final   Pathology report:  T3No High grade Transitional Cell Carcinoma - invasion through renal pelvis into the peri-nephric fat. Disposition: Home  Discharge instruction: The patient was instructed to be ambulatory but told to refrain from heavy lifting, strenuous activity, or driving.  Discharge medications:    Medication List    STOP taking these medications       enalapril 20 MG tablet  Commonly known as:  VASOTEC     zolpidem 5 MG tablet  Commonly known as:  AMBIEN      TAKE these medications       acetaminophen 500 MG tablet  Commonly known as:  TYLENOL  Take 1,000 mg by mouth every 6 (six) hours as needed.     clopidogrel 75 MG tablet  Commonly known as:  PLAVIX  Take 1 tablet (75 mg total) by mouth daily with breakfast.     CYANOCOBALAMIN IJ  Inject as directed every 30 (thirty) days.     esomeprazole 40  MG capsule  Commonly known as:  NEXIUM  Take 40 mg by mouth daily at 12 noon.     magnesium hydroxide 400 MG/5ML suspension  Commonly known as:  MILK OF MAGNESIA  Take 30 mLs by mouth daily as needed for mild constipation.     metoprolol 50 MG tablet  Commonly known as:  LOPRESSOR  Take 1 tablet (50 mg total) by mouth 2 (two) times daily.     omeprazole 20 MG capsule  Commonly known as:  PRILOSEC  Take 20 mg by mouth daily.     oxycodone 5 MG capsule  Commonly known as:  OXY-IR  Take 5 mg by mouth every 4 (four) hours as needed for pain.        Followup:  Follow-up Information   Follow up with [name], MD On 02/07/2013. (at 8:30)    Specialty:  Urology   Contact information:   [facility]  [address] [place] [address] [phone]

## 2013-01-29 NOTE — Discharge Instructions (Signed)
Post-Op Instructions 1.  Activity:  You are encouraged to ambulate frequently (about every hour during waking hours) to help prevent blood clots from forming in your legs or lungs.  However, you should not engage in any heavy lifting (> 10-15 lbs), strenuous activity, or straining. 2. Diet: You should advance your diet as instructed by your physician.  It will be normal to have some bloating, nausea, and abdominal discomfort intermittently. 3. Prescriptions:  You will be provided a prescription for pain medication to take as needed.  If your pain is not severe enough to require the prescription pain medication, you may take extra strength Tylenol instead which will have less side effects.  You should also take a prescribed stool softener to avoid straining with bowel movements as the prescription pain medication may constipate you. 4. Incisions: You may remove your dressing bandages 48 hours after surgery if not removed in the hospital.  You will either have some small staples or special tissue glue at each of the incision sites. Once the bandages are removed (if present), the incisions may stay open to air.  You may start showering (but not soaking or bathing in water) the 2nd day after surgery and the incisions simply need to be patted dry after the shower.  No additional care is needed. 5. What to call us about: You should call the office [phone]) if you develop fever > 101 or develop persistent vomiting.   Foley Catheter Care, Adult A soft, flexible tube (Foley catheter) has been placed in your bladder. This may be done to temporarily help with urine drainage after an operation or to relieve blockage from an enlarged prostate gland.  HOME CARE INSTRUCTIONS  If you are going home with a Foley catheter in place, follow these instructions: Taking Care of the Catheter:  Keep the area where the catheter leaves your body clean.  Attach the catheter to the leg so there is no tension on the  catheter.  Keep the drainage bag below the level of the bladder, but keep it OFF the floor.  Do not take long soaking baths.   Wash your hands before touching ANYTHING related to the catheter or bag.  Using mild soap and warm water on a washcloth:  Clean the area closest to the catheter insertion site using a circular motion around the catheter.  Clean the catheter itself by wiping AWAY from the insertion site for several inches down the tube.  NEVER wipe upward as this could sweep bacteria up into the urethra (tube in your body that normally drains the bladder) and cause infection. Taking Care of the Drainage Bags:  Two drainage bags will be taken home: a large overnight drainage bag, and a smaller leg bag which fits underneath clothing.  It is okay to wear the overnight bag at any time, but NEVER wear the smaller leg bag at night.  Keep the drainage bag well below the level of your bladder. This prevents backflow of urine into the bladder and allows the urine to drain freely.  Anchor the tubing to your leg to prevent pulling or tension on the catheter. Use tape or a leg strap provided by the hospital.  Empty the drainage bag when it is  to  full. Wash your hands before and after touching the bag.  Periodically check the tubing for kinks to make sure there is no pressure on the tubing which could restrict the flow of urine. Changing the Drainage Bags:  Cleanse both ends of  the clean bag with alcohol before changing.  Pinch off the rubber catheter to avoid urine spillage during the disconnection.  Disconnect the dirty bag and connect the clean one.  Empty the dirty bag carefully to avoid a urine spill.  Attach the new bag to the leg with tape or a leg strap. Cleaning the Drainage Bags:  Whenever a drainage bag is disconnected, it must be cleaned quickly so it is ready for the next use.  Wash the bag in warm, soapy water.  Rinse the bag thoroughly with warm  water.  Soak the bag for 30 minutes in a solution of white vinegar and water (1 cup vinegar to 1 quart warm water).  Rinse with warm water. SEEK MEDICAL CARE IF:   Some pain develops in the kidney (lower back) area.  The urine is cloudy or smells bad.  There is some blood in the urine.  The catheter becomes clogged and/or there is no urine drainage. SEEK IMMEDIATE MEDICAL CARE IF:   You have moderate or severe pain in the kidney region.  You start to throw up (vomit).  Blood fills the tube.  Worsening belly (abdominal) pain develops.  You have a fever. MAKE SURE YOU:   Understand these instructions.  Will watch your condition.  Will get help right away if you are not doing well or get worse.  Call [facility] if you have any questions or concerns: [phone]   6.  Activity:  You are encouraged to ambulate frequently (about every hour during waking hours) to help prevent blood clots from forming in your legs or lungs.  However, you should not engage in any heavy lifting (> 10-15 lbs), strenuous activity, or straining. 7. Diet: You should advance your diet as instructed by your physician.  It will be normal to have some bloating, nausea, and abdominal discomfort intermittently. 8. Prescriptions:  You will be provided a prescription for pain medication to take as needed.  If your pain is not severe enough to require the prescription pain medication, you may take extra strength Tylenol instead which will have less side effects.  You should also take a prescribed stool softener to avoid straining with bowel movements as the prescription pain medication may constipate you. 9. Incisions: You may remove your dressing bandages 48 hours after surgery if not removed in the hospital.  You will either have some small staples or special tissue glue at each of the incision sites. Once the bandages are removed (if present), the incisions may stay open to air.  You may start showering  (but not soaking or bathing in water) the 2nd day after surgery and the incisions simply need to be patted dry after the shower.  No additional care is needed. 10. What to call us about: You should call the office [phone]) if you develop fever > 101 or develop persistent vomiting.

## 2013-01-30 MED ORDER — METOPROLOL TARTRATE 50 MG PO TABS: 50.0000 mg | ORAL_TABLET | Freq: Two times a day (BID) | ORAL | Status: DC

## 2013-01-30 MED ORDER — CLOPIDOGREL BISULFATE 75 MG PO TABS: 75.0000 mg | ORAL_TABLET | Freq: Every day | ORAL | Status: DC

## 2013-01-30 NOTE — Progress Notes (Addendum)
[facility] PROGRESS NOTE  Angel Cooper GNF:[id number] DOB: Aug 20, 1930 DOA: 01/24/2013 PCP: Horton Finer, MD  Assessment/Plan: Atrial fibrillation with rapid ventricular response  -Has converted back to sinus rhythm spontaneously  -remain in sinus rhythm thereafter -home with metoprolol tartrate 50mg  bid  -CHADSVASc= 4 which is associated with approx. 5% risk of stroke per year  -I have discussed the risks, benefits, and alternatives of anticoagulation with warfarin versus antiplatelet therapy with the patient  -The patient adamantly does not want to start any anticoagulation at this time--he expressed understanding of his risk of stroke  -After repeat discussion of the risks, benefits, and alternatives, the patient is willing to start Plavix  -home with plavix 75mg  daily  -Echocardiogram--EF 84-69%, grade 1 diastolic dysfunction, diffuse HK  -K= 4.2, mag 1.8  -case discussed with Dr. Louis Meckel  Coronary artery disease with history of MI  -Coronary artery bypass graft in 2000  -Patient refuses antiplatelet therapy after discussion of the risks, benefits, and alternatives  hypertension  -Discontinue enalapril altogether due to his CKD and nephrectomy  -continue metoprolol tartrate--home with 50mg  bid  -plavix as discussed above CKD stage III  -Baseline creatinine 1.1-1.4  -stable -avoid NSAIDS, nephrotoxic agents Urothelial carcinoma--right ureter  -per Dr. Louis Meckel  Normocytic anemia  -Likely anemia of chronic disease  -Hemoglobin stable  -Outpatient workup  Leukocytosis  -Likely reactive due to the patient's acute medical condition  -Improving without any antibiotics  -Afebrile and hemodynamically stable HTN -fair control -metoprolol was increased to 50mg  bid -f/u with outpt PCP for further titration -discontinue enalapril altogether due to nephrectomy and high risk of renal failure  I will sign off.  Please call if you have any additional  question. [phone].            Procedures/Studies: Nm Pet Image Initial (pi) Skull Base To Thigh  01/13/2013   CLINICAL DATA:  Initial treatment strategy for transitional cell carcinoma.  EXAM: NUCLEAR MEDICINE PET SKULL BASE TO THIGH  FASTING BLOOD GLUCOSE:  Value: 88mg /dl  TECHNIQUE: 17.4 mCi F-18 FDG was injected intravenously. CT data was obtained and used for attenuation correction and anatomic localization only. (This was not acquired as a diagnostic CT examination.) Additional exam technical data entered on technologist worksheet.  COMPARISON:  CT 11/04/2012  FINDINGS: NECK  No hypermetabolic lymph nodes in the neck.  CHEST  Small pleural-based nodule at the medial right lung base measures 7 mm not significant changed 6 mm on prior. No metabolic activity associated the small nodule. No hypermetabolic mediastinal lymph nodes. Large hiatal hernia is noted. Physiologic uptake noted within the stomach.  ABDOMEN/PELVIS  There is right hydronephrosis similar to slightly worsened on comparison exam. Mild hydroureter on the right. The collecting systems and renal hilum on the right are not well evaluated by FDG PET imaging due to the high activity of the excreted FDG.  No evidence of metastatic adenopathy in the abdomen or pelvis. No abnormal activity within the liver. No abnormal activity in the pelvis.  There is significant sigmoid diverticulosis.  SKELETON  No focal hypermetabolic activity to suggest skeletal metastasis.  IMPRESSION: 1. No evidence of metastatic transitional cell carcinoma.  . 2. Right hydronephrosis and hydroureter are slightly worsened than comparison exam. 3. Stable small right lower lobe pulmonary nodule.   Electronically Signed   By: Suzy Bouchard M.D.   On: 01/13/2013 16:15   Dg C-arm 1-60 Min-no Report  01/24/2013   CLINICAL DATA: stent   C-ARM 1-60 MINUTES  Fluoroscopy was  utilized by the requesting physician.  No radiographic  interpretation.           Subjective: Patient is doing well. Denies fevers, chills, chest pain, shortness breath, nausea, vomiting, diarrhea. He is tolerating his diet. He has some incisional pain.  Objective: Filed Vitals:   01/29/13 1200 01/29/13 1530 01/29/13 2000 01/30/13 0628  BP: 158/69 154/80 126/71 143/72  Pulse: 80 87 87 82  Temp: 98.4 F (36.9 C) 97.8 F (36.6 C) 98.2 F (36.8 C) 98.1 F (36.7 C)  TempSrc: Oral Oral Oral Oral  Resp: 14 20 20 18   Height:      Weight:      SpO2: 100% 98% 99% 97%    Intake/Output Summary (Last 24 hours) at 01/30/13 0650 Last data filed at 01/30/13 0630  Gross per 24 hour  Intake   1575 ml  Output   1476 ml  Net     99 ml   Weight change:  Exam:   General:  Pt is alert, follows commands appropriately, not in acute distress  HEENT: No icterus, No thrush,  [place]/AT  Cardiovascular: RRR, S1/S2, no rubs, no gallops  Respiratory: CTA bilaterally, no wheezing, no crackles, no rhonchi  Abdomen: Soft/+BS, mild right abdomen tenderness without any peritoneal signs non distended, no guarding  Extremities: No edema, No lymphangitis, No petechiae, No rashes, no synovitis  Data Reviewed: Basic Metabolic Panel:  Recent Labs Lab 01/25/13 0531 01/26/13 0528 01/27/13 0546 01/28/13 0902 01/29/13 0345  NA 134* 133* 135* 130* 132*  K 4.6 4.5 4.5 4.4 4.2  CL 98 97 100 97 99  CO2 25 23 23 21 20   GLUCOSE 125* 121* 118* 97 91  BUN 18 12 12 12 13   CREATININE 1.58* 1.58* 1.45* 1.31 1.31  CALCIUM 8.1* 7.9* 8.0* 8.1* 7.9*  MG  --   --   --  1.8  --    Liver Function Tests: No results found for this basename: AST, ALT, ALKPHOS, BILITOT, PROT, ALBUMIN,  in the last 168 hours No results found for this basename: LIPASE, AMYLASE,  in the last 168 hours No results found for this basename: AMMONIA,  in the last 168 hours CBC:  Recent Labs Lab 01/24/13 2015 01/25/13 0531 01/26/13 0528 01/28/13 0902 01/29/13 0345  WBC 23.7* 18.4* 15.7* 15.4* 13.9*   HGB 11.4* 10.2* 10.0* 10.7* 10.1*  HCT 34.2* 30.9* 31.4* 32.3* 31.1*  MCV 90.0 90.4 92.1 90.2 90.4  PLT 574* 471* 432* 569* 534*   Cardiac Enzymes: No results found for this basename: CKTOTAL, CKMB, CKMBINDEX, TROPONINI,  in the last 168 hours BNP: No components found with this basename: POCBNP,  CBG: No results found for this basename: GLUCAP,  in the last 168 hours  Recent Results (from the past 240 hour(s))  MRSA PCR SCREENING     Status: None   Collection Time    01/27/13  9:43 PM      Result Value Range Status   MRSA by PCR NEGATIVE  NEGATIVE Final   Comment:            The GeneXpert MRSA Assay (FDA     approved for NASAL specimens     only), is one component of a     comprehensive MRSA colonization     surveillance program. It is not     intended to diagnose MRSA     infection nor to guide or     monitor treatment for     MRSA infections.  URINE CULTURE     Status: None   Collection Time    01/28/13 12:04 PM      Result Value Range Status   Specimen Description URINE, CATHETERIZED   Final   Special Requests NONE   Final   Culture  Setup Time     Final   Value: 01/28/2013 18:09     Performed at [place]     Final   Value: NO GROWTH     Performed at [facility]   Culture     Final   Value: NO GROWTH     Performed at [facility]   Report Status 01/29/2013 FINAL   Final     Scheduled Meds: . acetaminophen  1,000 mg Oral Q6H  . calcium carbonate  800 mg of elemental calcium Oral BID  . clopidogrel  75 mg Oral Q breakfast  . docusate sodium  100 mg Oral BID  . esomeprazole  40 mg Oral Daily  . feeding supplement (RESOURCE BREEZE)  1 Container Oral BID BM  . heparin subcutaneous  5,000 Units Subcutaneous Q8H  . magnesium hydroxide  15 mL Oral Daily  . metoCLOPramide (REGLAN) injection  5 mg Intravenous Q6H  . metoprolol tartrate  50 mg Oral BID   Continuous Infusions: . dextrose 5 % and 0.45 % NaCl with KCl 10  mEq/L 75 mL/hr at 01/29/13 0800     Hazleigh Mccleave, DO  [facility] Pager [phone]  If 7PM-7AM, please contact night-coverage [email or web address] Password [email or web address] 01/30/2013, 6:50 AM   LOS: 6 days

## 2013-01-30 NOTE — Progress Notes (Signed)
Patient ID: CURLEY HOGEN, male   DOB: 1930/07/10, 80 y.o.   MRN: [id number]  No complaints. Feels ready to go home.   Filed Vitals:   01/30/13 0628  BP: 143/72  Pulse: 82  Temp: 98.1 F (36.7 C)  Resp: 18    Intake/Output Summary (Last 24 hours) at 01/30/13 1010 Last data filed at 01/30/13 0630  Gross per 24 hour  Intake   1110 ml  Output   1226 ml  Net   -116 ml     PE: Sitting in a chair, watching TV, NAD A&Ox3 Abd - soft, NT, incisions C/D/I Ext - mild edema bilaterally GU - foley in place, urine clear.   CBC    Component Value Date/Time   WBC 13.9* 01/29/2013 0345   RBC 3.44* 01/29/2013 0345   HGB 10.1* 01/29/2013 0345   HCT 31.1* 01/29/2013 0345   PLT 534* 01/29/2013 0345   MCV 90.4 01/29/2013 0345   MCH 29.4 01/29/2013 0345   MCHC 32.5 01/29/2013 0345   RDW 14.2 01/29/2013 0345    BMET    Component Value Date/Time   NA 132* 01/29/2013 0345   K 4.2 01/29/2013 0345   CL 99 01/29/2013 0345   CO2 20 01/29/2013 0345   GLUCOSE 91 01/29/2013 0345   BUN 13 01/29/2013 0345   CREATININE 1.31 01/29/2013 0345   CALCIUM 7.9* 01/29/2013 0345   GFRNONAA 50* 01/29/2013 0345   GFRAA 58* 01/29/2013 0345   Imp - POD# 6 right Nephro-U Plan - d/c home Appreciate Dr. Melvern Sample, please see his note Home with foley

## 2013-02-08 ENCOUNTER — Telehealth: Payer: Self-pay | Admitting: Oncology

## 2013-02-08 NOTE — Telephone Encounter (Signed)
S/W PT AND GVE NP APPT 01/27 @ 11 10:30 W/DR. SHADAD REFERRING DR. [name] DX- ABD PAIN/MASSIVE HIGH GRADE TRANSITIONAL CANCER WELCOME PACKET MAILED.

## 2013-02-08 NOTE — Telephone Encounter (Signed)
C/D 02/08/13 for appt. 02/22/13

## 2013-02-22 ENCOUNTER — Encounter: Payer: Self-pay | Admitting: Oncology

## 2013-02-22 ENCOUNTER — Other Ambulatory Visit: Payer: Self-pay | Admitting: Oncology

## 2013-02-22 ENCOUNTER — Other Ambulatory Visit (HOSPITAL_BASED_OUTPATIENT_CLINIC_OR_DEPARTMENT_OTHER): Payer: Medicare Other

## 2013-02-22 ENCOUNTER — Ambulatory Visit (HOSPITAL_BASED_OUTPATIENT_CLINIC_OR_DEPARTMENT_OTHER): Payer: Medicare Other | Admitting: Oncology

## 2013-02-22 ENCOUNTER — Ambulatory Visit: Payer: Medicare Other

## 2013-02-22 VITALS — BP 143/76 | HR 81 | Temp 96.8°F | Resp 18 | Ht 62.5 in | Wt 148.2 lb

## 2013-02-22 DIAGNOSIS — C669 Malignant neoplasm of unspecified ureter: Secondary | ICD-10-CM

## 2013-02-22 DIAGNOSIS — C659 Malignant neoplasm of unspecified renal pelvis: Secondary | ICD-10-CM

## 2013-02-22 LAB — CBC WITH DIFFERENTIAL/PLATELET
BASO%: 0.7 % (ref 0.0–2.0)
Basophils Absolute: 0.1 10*3/uL (ref 0.0–0.1)
EOS ABS: 0.8 10*3/uL — AB (ref 0.0–0.5)
EOS%: 5 % (ref 0.0–7.0)
HCT: 34.2 % — ABNORMAL LOW (ref 38.4–49.9)
HGB: 11.2 g/dL — ABNORMAL LOW (ref 13.0–17.1)
LYMPH%: 18.2 % (ref 14.0–49.0)
MCH: 29.9 pg (ref 27.2–33.4)
MCHC: 32.6 g/dL (ref 32.0–36.0)
MCV: 91.7 fL (ref 79.3–98.0)
MONO#: 1.2 10*3/uL — ABNORMAL HIGH (ref 0.1–0.9)
MONO%: 8 % (ref 0.0–14.0)
NEUT%: 68.1 % (ref 39.0–75.0)
NEUTROS ABS: 10.5 10*3/uL — AB (ref 1.5–6.5)
Platelets: 344 10*3/uL (ref 140–400)
RBC: 3.73 10*6/uL — ABNORMAL LOW (ref 4.20–5.82)
RDW: 16.2 % — ABNORMAL HIGH (ref 11.0–14.6)
WBC: 15.4 10*3/uL — ABNORMAL HIGH (ref 4.0–10.3)
lymph#: 2.8 10*3/uL (ref 0.9–3.3)

## 2013-02-22 LAB — COMPREHENSIVE METABOLIC PANEL (CC13)
ALK PHOS: 75 U/L (ref 40–150)
ALT: <6 U/L (ref 0–55)
AST: 11 U/L (ref 5–34)
Albumin: 3.6 g/dL (ref 3.5–5.0)
Anion Gap: 8 mEq/L (ref 3–11)
BILIRUBIN TOTAL: 0.36 mg/dL (ref 0.20–1.20)
BUN: 24.7 mg/dL (ref 7.0–26.0)
CO2: 23 mEq/L (ref 22–29)
Calcium: 9.1 mg/dL (ref 8.4–10.4)
Chloride: 107 mEq/L (ref 98–109)
Creatinine: 1.5 mg/dL — ABNORMAL HIGH (ref 0.7–1.3)
GLUCOSE: 100 mg/dL (ref 70–140)
Potassium: 5 mEq/L (ref 3.5–5.1)
SODIUM: 139 meq/L (ref 136–145)
TOTAL PROTEIN: 6.9 g/dL (ref 6.4–8.3)

## 2013-02-22 MED ORDER — ESOMEPRAZOLE MAGNESIUM 40 MG PO CPDR: 40.0000 mg | DELAYED_RELEASE_CAPSULE | Freq: Every day | ORAL | Status: DC

## 2013-02-22 NOTE — Consult Note (Signed)
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   HPI: 80 year old gentleman currently of [place] where he lived the majority of his life. Isn't gentleman with a past medical history significant for hypertension and coronary artery disease. He presented to his primary care physician with complaints of abdominal pain and weight loss. Patient's pain has been going on for several months now, it is located in the his epigastric/peri-umbilical region. It is improved when he eats. It is not constant, but comes on daily. He has lost 8 lbs. Has had associated slower bowel motility, hasn't noticed any blood per rectum. He denies any hematuria/dysuria/flank pain. Patient states that he's had stomach pain for > 54yrs and that often time he could double his prilosec and his symptoms would improve, however this hasn't worked for him this time. A CT scan showed right renal pelvic mass and underwent a biopsy and the pathology confirmed the presence of high-grade adenocarcinoma. On 01/24/2013, he underwent right radical nephrectomy and partial urethrectomy. This was done by Dr. Louis Meckel. 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 No lymphovascular invasion was identified and no margins were involved. 3 lymph nodes were sampled none had any cancer involvement. The pathological staging was T3 N0. Patient recovered from the operation without any major complications. He did develop atrial fibrillation with rapid ventricular response postoperatively and have been controlled since that time. He have recovered quite nicely from his operation and have resumed most activities of daily living. He still complains of abdominal pain and distention at times. He does report difficulty urination that time but no hematuria. He reports his appetite is improved and is gaining his weight back. Is not reporting any back pain shoulder pain or hip  pain. Has not reported any new constitutional symptoms.    Past Medical History  Diagnosis Date  . Hypertension   . Hypercholesterolemia   . Myocardial infarction 1995  . Shortness of breath     occasionally with exertion  . GERD (gastroesophageal reflux disease)   . H/O hiatal hernia   . Arthritis   . H/O urinary frequency     with nocturia  . Coronary artery disease     NO LONGER SEES CARDIOLOGIST - LAST SAW DR. [name] 2 OR MORE YRS AGO-PT SEES MEDICAL DR Maxwell Caul AT LEAST ONCE A  YEAR  . Pain     BACK PAIN - PT STATES HIS PAIN MAY BE COMING FROM THE URETERAL STENT  . Cancer   . Rash     Dr [place] Crane- scattered over body, reddened areas under the skin  :  Past Surgical History  Procedure Laterality Date  . Coronary artery bypass graft  1999  . Cardiac catheterization  1998  . Eye surgery Bilateral     cataract extraction with IOL  . Cystoscopy with retrograde pyelogram, ureteroscopy and stent placement Right 12/03/2012    Procedure: CYSTOSCOPY WITH RIGHT RETROGRADE PYELOGRAM,RIGHT URETERAL WASHINGS, RIGHT URETERAL BIOPSY, RIGHT URETEROSCOPY AND STENT PLACEMENT;  Surgeon: [name], MD;  Location: [facility];  Service: Urology;  Laterality: Right;  . Cystoscopy with retrograde pyelogram, ureteroscopy and stent placement Right 12/15/2012    Procedure: CYSTOSCOPY WITH RIGHT RETROGRADE PYELOGRAM, POSSIBLE RIGHT URETEROSCOPY AND STENT PLACEMENT AND REMOVAL OF LEFT STENT;  Surgeon: [name], MD;  Location: [facility];  Service: Urology;  Laterality: Right;  . Robot assited laparoscopic nephroureterectomy Right 01/24/2013    Procedure: ROBOT ASSISTED LAPAROSCOPIC NEPHROURETERECTOMY WITH  BILATERAL LYMPH NODE DISSECTION ;  Surgeon: [name], MD;  Location: [facility];  Service: Urology;  Laterality: Right;  right kidney  . Cystoscopy with stent placement Right 01/24/2013    Procedure: CYSTOSCOPY WITH STENT PLACEMENT;  Surgeon: [name], MD;  Location: [facility];   Service: Urology;  Laterality: Right;  :  Current Outpatient Prescriptions  Medication Sig Dispense Refill  . acetaminophen (TYLENOL) 500 MG tablet Take 1,000 mg by mouth every 6 (six) hours as needed.      . CYANOCOBALAMIN IJ Inject as directed every 30 (thirty) days.      Marland Kitchen esomeprazole (NEXIUM) 40 MG capsule Take 40 mg by mouth daily at 12 noon.      . magnesium hydroxide (MILK OF MAGNESIA) 400 MG/5ML suspension Take 30 mLs by mouth daily as needed for mild constipation.      . metoprolol (LOPRESSOR) 50 MG tablet Take 1 tablet (50 mg total) by mouth 2 (two) times daily.  60 tablet  1  . enalapril (VASOTEC) 10 MG tablet Take 10 mg by mouth daily.      Marland Kitchen esomeprazole (NEXIUM) 40 MG capsule Take 1 capsule (40 mg total) by mouth daily at 12 noon.  30 capsule  3   No current facility-administered medications for this visit.      Allergies  Allergen Reactions  . Aspirin     Stomach pain   . Benadryl [Diphenhydramine Hcl (Sleep)] Hives  . Chlorhexidine     CAUSED SKIN IRRITATION AND BURNING SENSATION  :  No family history on file.:  History   Social History  . Marital Status: Married    Spouse Name: N/A    Number of Children: N/A  . Years of Education: N/A   Occupational History  . Not on file.   Social History Main Topics  . Smoking status: Former Smoker    Quit date: 01/27/1958  . Smokeless tobacco: Never Used  . Alcohol Use: Yes     Comment: 2 -3 beers or mixed drink week  . Drug Use: No  . Sexual Activity: Not on file   Other Topics Concern  . Not on file   Social History Narrative  . No narrative on file  :  Constitutional: negative for anorexia, chills and fatigue Eyes: negative for icterus, irritation and redness Ears, nose, mouth, throat, and face: negative for epistaxis, hoarseness and nasal congestion Respiratory: negative for asthma, cough and dyspnea on exertion Cardiovascular: negative for chest pain, dyspnea and exertional chest  pressure/discomfort Gastrointestinal: negative for constipation, diarrhea and melena Genitourinary:negative for dysuria, hematuria and nocturia Integument/breast: negative for pruritus, rash and skin color change Hematologic/lymphatic: negative for bleeding, easy bruising and lymphadenopathy Musculoskeletal:negative for arthralgias, muscle weakness and myalgias Neurological: negative for gait problems, headaches and seizures Behavioral/Psych: negative for anxiety and depression Endocrine: negative for temperature intolerance Allergic/Immunologic: negative for anaphylaxis and urticaria  Exam: ECOG 1 Blood pressure 143/76, pulse 81, temperature 96.8 F (36 C), temperature source Oral, resp. rate 18, height 5' 2.5" (1.588 m), weight 148 lb 3.2 oz (67.223 kg). General appearance: alert, cooperative and appears stated age Head: Normocephalic, without obvious abnormality, atraumatic Eyes: conjunctivae/corneas clear. PERRL, EOM's intact. Fundi benign. Nose: Nares normal. Septum midline. Mucosa normal. No drainage or sinus tenderness. Throat: lips, mucosa, and tongue normal; teeth and gums normal Neck: no adenopathy, no carotid bruit, no JVD, supple, symmetrical, trachea midline and thyroid not enlarged, symmetric, no tenderness/mass/nodules Back: symmetric, no curvature. ROM normal. No CVA tenderness.  Chest wall: no tenderness Cardio: regular rate and rhythm, S1, S2 normal, no murmur, click, rub or gallop GI: soft, non-tender; bowel sounds normal; no masses,  no organomegaly Extremities: extremities normal, atraumatic, no cyanosis or edema Pulses: 2+ and symmetric Skin: Skin color, texture, turgor normal. No rashes or lesions Lymph nodes: Cervical, supraclavicular, and axillary nodes normal.   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 He is status post radical nephrectomy with the tumor showed invasive high-grade urothelial carcinoma invading into the peripelvic fatty tissue but no margins are involved. The pathological staging was T3 N0. He did have a PET CT scan which I have reviewed personally on 01/13/2013 and showed no evidence of advanced metastatic disease. I had a discussion today with the patient regarding the natural course of this disease as well as treatment options. He understands that this is a rather aggressive tumor and has a tendency to relapse and recur rather aggressively. The role of adjuvant chemotherapy in genitourinary cancers were discussed or specifically pertaining to renal pelvis and bladder cancers. There is really no clear-cut evidence that adjuvant chemotherapy at this and he survival advantage in this particular setting but given the aggressive nature of his cancer I discussed with him the role of cisplatin based adjuvant chemotherapy. The complication of this chemotherapy were discussed including nephrotoxicity myelosuppression among other complications in the setting of unknown benefit. He declined at this time and I have offered him active surveillance which is agreeable to do and prefers to do under the care of Dr. Louis Meckel. I discussed with them the role palliative chemotherapy should he developes metastatic disease and he is not sure he would do that as well. In any case, I will be happy to see him in the future as needed I have given them my contact information and also all his questions were answered.

## 2013-02-22 NOTE — Progress Notes (Signed)
Please see consult note.  

## 2013-02-22 NOTE — Progress Notes (Signed)
Checked in new pt with no financial concerns. °

## 2013-04-13 ENCOUNTER — Other Ambulatory Visit: Payer: Self-pay | Admitting: Internal Medicine

## 2013-04-13 ENCOUNTER — Ambulatory Visit
Admission: RE | Admit: 2013-04-13 | Discharge: 2013-04-13 | Disposition: A | Discharge status: Home / Self Care | Payer: Medicare Other | Source: Ambulatory Visit | Attending: Internal Medicine | Admitting: Internal Medicine

## 2013-04-13 DIAGNOSIS — R0600 Dyspnea, unspecified: Secondary | ICD-10-CM

## 2013-04-28 ENCOUNTER — Ambulatory Visit (HOSPITAL_COMMUNITY): Payer: Medicare Other | Attending: Cardiovascular Disease | Admitting: Radiology

## 2013-04-28 ENCOUNTER — Encounter: Payer: Self-pay | Admitting: Cardiovascular Disease

## 2013-04-28 DIAGNOSIS — R0989 Other specified symptoms and signs involving the circulatory and respiratory systems: Secondary | ICD-10-CM

## 2013-04-28 DIAGNOSIS — I25810 Atherosclerosis of coronary artery bypass graft(s) without angina pectoris: Secondary | ICD-10-CM

## 2013-04-28 DIAGNOSIS — I4891 Unspecified atrial fibrillation: Secondary | ICD-10-CM

## 2013-04-28 NOTE — Progress Notes (Addendum)
Echocardiogram not performed. Spoke with Dr. Delene Ruffini nurse, patient does not need echo. Echo was performed in the hospital.

## 2013-04-29 ENCOUNTER — Encounter (HOSPITAL_COMMUNITY): Payer: Self-pay | Admitting: Internal Medicine

## 2013-11-10 ENCOUNTER — Other Ambulatory Visit: Payer: Self-pay | Admitting: Internal Medicine

## 2013-11-10 ENCOUNTER — Ambulatory Visit
Admission: RE | Admit: 2013-11-10 | Discharge: 2013-11-10 | Disposition: A | Discharge status: Home / Self Care | Payer: Medicare Other | Source: Ambulatory Visit | Attending: Internal Medicine | Admitting: Internal Medicine

## 2013-11-10 DIAGNOSIS — M542 Cervicalgia: Secondary | ICD-10-CM

## 2013-11-11 ENCOUNTER — Other Ambulatory Visit: Payer: Self-pay

## 2013-12-08 ENCOUNTER — Other Ambulatory Visit (HOSPITAL_COMMUNITY): Payer: Self-pay | Admitting: Urology

## 2013-12-08 ENCOUNTER — Ambulatory Visit (HOSPITAL_COMMUNITY)
Admission: RE | Admit: 2013-12-08 | Discharge: 2013-12-08 | Disposition: A | Discharge status: Home / Self Care | Payer: Medicare Other | Source: Ambulatory Visit | Attending: Urology | Admitting: Urology

## 2013-12-08 DIAGNOSIS — Z85528 Personal history of other malignant neoplasm of kidney: Secondary | ICD-10-CM | POA: Diagnosis present

## 2013-12-08 DIAGNOSIS — Z951 Presence of aortocoronary bypass graft: Secondary | ICD-10-CM | POA: Insufficient documentation

## 2013-12-08 DIAGNOSIS — K449 Diaphragmatic hernia without obstruction or gangrene: Secondary | ICD-10-CM | POA: Insufficient documentation

## 2013-12-08 DIAGNOSIS — M40209 Unspecified kyphosis, site unspecified: Secondary | ICD-10-CM | POA: Diagnosis not present

## 2013-12-08 DIAGNOSIS — R52 Pain, unspecified: Secondary | ICD-10-CM | POA: Diagnosis present

## 2013-12-08 DIAGNOSIS — C651 Malignant neoplasm of right renal pelvis: Secondary | ICD-10-CM

## 2014-07-24 ENCOUNTER — Other Ambulatory Visit: Payer: Self-pay

## 2014-12-18 ENCOUNTER — Ambulatory Visit (HOSPITAL_COMMUNITY)
Admission: RE | Admit: 2014-12-18 | Discharge: 2014-12-18 | Disposition: A | Discharge status: Home / Self Care | Payer: Medicare Other | Source: Ambulatory Visit | Attending: Urology | Admitting: Urology

## 2014-12-18 ENCOUNTER — Other Ambulatory Visit: Payer: Self-pay | Admitting: Urology

## 2014-12-18 DIAGNOSIS — M5134 Other intervertebral disc degeneration, thoracic region: Secondary | ICD-10-CM | POA: Insufficient documentation

## 2014-12-18 DIAGNOSIS — C651 Malignant neoplasm of right renal pelvis: Secondary | ICD-10-CM | POA: Diagnosis not present

## 2015-12-17 ENCOUNTER — Other Ambulatory Visit: Payer: Self-pay | Admitting: Urology

## 2015-12-17 ENCOUNTER — Ambulatory Visit (HOSPITAL_COMMUNITY)
Admission: RE | Admit: 2015-12-17 | Discharge: 2015-12-17 | Disposition: A | Discharge status: Home / Self Care | Payer: Medicare Other | Source: Ambulatory Visit | Attending: Urology | Admitting: Urology

## 2015-12-17 DIAGNOSIS — K449 Diaphragmatic hernia without obstruction or gangrene: Secondary | ICD-10-CM | POA: Insufficient documentation

## 2015-12-17 DIAGNOSIS — C651 Malignant neoplasm of right renal pelvis: Secondary | ICD-10-CM | POA: Diagnosis present

## 2016-01-07 ENCOUNTER — Other Ambulatory Visit: Payer: Self-pay | Admitting: Urology

## 2016-01-23 ENCOUNTER — Encounter (HOSPITAL_BASED_OUTPATIENT_CLINIC_OR_DEPARTMENT_OTHER): Payer: Self-pay | Admitting: *Deleted

## 2016-01-24 ENCOUNTER — Encounter (HOSPITAL_BASED_OUTPATIENT_CLINIC_OR_DEPARTMENT_OTHER): Payer: Self-pay | Admitting: *Deleted

## 2016-01-24 NOTE — Progress Notes (Addendum)
NPO AFTER MN.  ARRIVE AT 1030.  NEEDS ISTAT 8  AND EKG.  WILL TAKE NORVASC AND PRILOSEC AM DOS W/ SIPS OF WATER. REQUESTING LOV NOTE AND EKG FROM PT'S PCP, DR [name].   ADDENDUM:  REVIEWED CHART W/ DR ROSE MDA,  STATED OK TO PROCEED.

## 2016-01-31 ENCOUNTER — Ambulatory Visit (HOSPITAL_BASED_OUTPATIENT_CLINIC_OR_DEPARTMENT_OTHER): Payer: Medicare Other | Admitting: Anesthesiology

## 2016-01-31 ENCOUNTER — Encounter (HOSPITAL_BASED_OUTPATIENT_CLINIC_OR_DEPARTMENT_OTHER): Payer: Self-pay | Admitting: *Deleted

## 2016-01-31 ENCOUNTER — Ambulatory Visit (HOSPITAL_BASED_OUTPATIENT_CLINIC_OR_DEPARTMENT_OTHER)
Admission: RE | Admit: 2016-01-31 | Discharge: 2016-01-31 | Disposition: A | Discharge status: Home / Self Care | Payer: Medicare Other | Source: Ambulatory Visit | Attending: Urology | Admitting: Urology

## 2016-01-31 ENCOUNTER — Encounter (HOSPITAL_BASED_OUTPATIENT_CLINIC_OR_DEPARTMENT_OTHER)
Admission: RE | Disposition: A | Discharge status: Home / Self Care | Payer: Self-pay | Source: Ambulatory Visit | Attending: Urology

## 2016-01-31 DIAGNOSIS — K219 Gastro-esophageal reflux disease without esophagitis: Secondary | ICD-10-CM | POA: Diagnosis not present

## 2016-01-31 DIAGNOSIS — K449 Diaphragmatic hernia without obstruction or gangrene: Secondary | ICD-10-CM | POA: Diagnosis not present

## 2016-01-31 DIAGNOSIS — I10 Essential (primary) hypertension: Secondary | ICD-10-CM | POA: Insufficient documentation

## 2016-01-31 DIAGNOSIS — I252 Old myocardial infarction: Secondary | ICD-10-CM | POA: Insufficient documentation

## 2016-01-31 DIAGNOSIS — C672 Malignant neoplasm of lateral wall of bladder: Secondary | ICD-10-CM | POA: Diagnosis present

## 2016-01-31 DIAGNOSIS — I2510 Atherosclerotic heart disease of native coronary artery without angina pectoris: Secondary | ICD-10-CM | POA: Diagnosis not present

## 2016-01-31 DIAGNOSIS — Z888 Allergy status to other drugs, medicaments and biological substances status: Secondary | ICD-10-CM | POA: Diagnosis not present

## 2016-01-31 DIAGNOSIS — Z951 Presence of aortocoronary bypass graft: Secondary | ICD-10-CM | POA: Diagnosis not present

## 2016-01-31 DIAGNOSIS — Z905 Acquired absence of kidney: Secondary | ICD-10-CM | POA: Insufficient documentation

## 2016-01-31 DIAGNOSIS — Z886 Allergy status to analgesic agent status: Secondary | ICD-10-CM | POA: Insufficient documentation

## 2016-01-31 DIAGNOSIS — Z87891 Personal history of nicotine dependence: Secondary | ICD-10-CM | POA: Diagnosis not present

## 2016-01-31 DIAGNOSIS — R0602 Shortness of breath: Secondary | ICD-10-CM | POA: Insufficient documentation

## 2016-01-31 DIAGNOSIS — C661 Malignant neoplasm of right ureter: Secondary | ICD-10-CM

## 2016-01-31 HISTORY — DX: Heart failure, unspecified: I50.9

## 2016-01-31 HISTORY — DX: Unspecified atrial fibrillation: I48.91

## 2016-01-31 HISTORY — DX: Personal history of peptic ulcer disease: Z87.11

## 2016-01-31 HISTORY — DX: Diaphragmatic hernia without obstruction or gangrene: K44.9

## 2016-01-31 HISTORY — DX: Neoplasm of unspecified behavior of bladder: D49.4

## 2016-01-31 HISTORY — DX: Chronic kidney disease, stage 3 (moderate): N18.3

## 2016-01-31 HISTORY — DX: Presence of aortocoronary bypass graft: Z95.1

## 2016-01-31 HISTORY — DX: Hyperlipidemia, unspecified: E78.5

## 2016-01-31 HISTORY — DX: Old myocardial infarction: I25.2

## 2016-01-31 HISTORY — DX: Malignant neoplasm of right ureter: C66.1

## 2016-01-31 HISTORY — DX: Personal history of other diseases of the digestive system: Z87.19

## 2016-01-31 HISTORY — DX: Chronic kidney disease, stage 3 unspecified: N18.30

## 2016-01-31 HISTORY — PX: CYSTOSCOPY W/ RETROGRADES: SHX1426

## 2016-01-31 HISTORY — PX: TRANSURETHRAL RESECTION OF BLADDER TUMOR: SHX2575

## 2016-01-31 HISTORY — DX: Unspecified osteoarthritis, unspecified site: M19.90

## 2016-01-31 HISTORY — DX: Personal history of colonic polyps: Z86.010

## 2016-01-31 HISTORY — DX: Personal history of colon polyps, unspecified: Z86.0100

## 2016-01-31 LAB — POCT I-STAT, CHEM 8
BUN: 27 mg/dL — ABNORMAL HIGH (ref 6–20)
CALCIUM ION: 1.18 mmol/L (ref 1.15–1.40)
CREATININE: 1.6 mg/dL — AB (ref 0.61–1.24)
Chloride: 103 mmol/L (ref 101–111)
GLUCOSE: 99 mg/dL (ref 65–99)
HEMATOCRIT: 38 % — AB (ref 39.0–52.0)
HEMOGLOBIN: 12.9 g/dL — AB (ref 13.0–17.0)
Potassium: 4.2 mmol/L (ref 3.5–5.1)
Sodium: 142 mmol/L (ref 135–145)
TCO2: 28 mmol/L (ref 0–100)

## 2016-01-31 SURGERY — TURBT (TRANSURETHRAL RESECTION OF BLADDER TUMOR)
Anesthesia: General | Site: Ureter

## 2016-01-31 MED ORDER — PROPOFOL 10 MG/ML IV BOLUS
INTRAVENOUS | Status: DC | PRN
  Administered 2016-01-31: 120 mg via INTRAVENOUS

## 2016-01-31 MED ORDER — CIPROFLOXACIN IN D5W 400 MG/200ML IV SOLN
400.0000 mg | INTRAVENOUS | Status: AC
  Administered 2016-01-31: 400 mg via INTRAVENOUS
  Filled 2016-01-31: qty 200

## 2016-01-31 MED ORDER — SODIUM CHLORIDE 0.9 % IR SOLN
Status: DC | PRN
  Administered 2016-01-31: 6000 mL

## 2016-01-31 MED ORDER — MITOMYCIN CHEMO FOR BLADDER INSTILLATION 40 MG
40.0000 mg | Freq: Once | INTRAVENOUS | Status: AC
  Administered 2016-01-31: 40 mg via INTRAVESICAL
  Filled 2016-01-31: qty 40

## 2016-01-31 MED ORDER — LIDOCAINE 2% (20 MG/ML) 5 ML SYRINGE
INTRAMUSCULAR | Status: AC
  Filled 2016-01-31: qty 5

## 2016-01-31 MED ORDER — CIPROFLOXACIN IN D5W 400 MG/200ML IV SOLN
INTRAVENOUS | Status: AC
  Filled 2016-01-31: qty 200

## 2016-01-31 MED ORDER — FENTANYL CITRATE (PF) 100 MCG/2ML IJ SOLN
25.0000 ug | INTRAMUSCULAR | Status: DC | PRN
  Administered 2016-01-31 (×2): 25 ug via INTRAVENOUS
  Filled 2016-01-31: qty 1

## 2016-01-31 MED ORDER — METOCLOPRAMIDE HCL 5 MG/ML IJ SOLN
INTRAMUSCULAR | Status: AC
Start: 2016-01-31 — End: 2016-01-31
  Filled 2016-01-31: qty 2

## 2016-01-31 MED ORDER — BELLADONNA ALKALOIDS-OPIUM 16.2-60 MG RE SUPP
RECTAL | Status: DC | PRN
  Administered 2016-01-31: 1 via RECTAL

## 2016-01-31 MED ORDER — LIDOCAINE HCL (CARDIAC) 20 MG/ML IV SOLN
INTRAVENOUS | Status: DC | PRN
  Administered 2016-01-31: 60 mg via INTRAVENOUS

## 2016-01-31 MED ORDER — FENTANYL CITRATE (PF) 100 MCG/2ML IJ SOLN
INTRAMUSCULAR | Status: DC | PRN
  Administered 2016-01-31: 50 ug via INTRAVENOUS
  Administered 2016-01-31 (×2): 25 ug via INTRAVENOUS

## 2016-01-31 MED ORDER — PHENAZOPYRIDINE HCL 100 MG PO TABS
ORAL_TABLET | ORAL | Status: AC
  Filled 2016-01-31: qty 2

## 2016-01-31 MED ORDER — METOCLOPRAMIDE HCL 5 MG/ML IJ SOLN
10.0000 mg | Freq: Once | INTRAMUSCULAR | Status: DC | PRN
  Filled 2016-01-31: qty 2

## 2016-01-31 MED ORDER — LACTATED RINGERS IV SOLN
INTRAVENOUS | Status: DC
  Administered 2016-01-31 (×2): via INTRAVENOUS
  Filled 2016-01-31: qty 1000

## 2016-01-31 MED ORDER — IOHEXOL 300 MG/ML  SOLN
INTRAMUSCULAR | Status: DC | PRN
  Administered 2016-01-31: 10 mL

## 2016-01-31 MED ORDER — PHENYLEPHRINE 40 MCG/ML (10ML) SYRINGE FOR IV PUSH (FOR BLOOD PRESSURE SUPPORT)
PREFILLED_SYRINGE | INTRAVENOUS | Status: DC | PRN
  Administered 2016-01-31 (×2): 80 ug via INTRAVENOUS

## 2016-01-31 MED ORDER — BELLADONNA ALKALOIDS-OPIUM 16.2-60 MG RE SUPP
RECTAL | Status: AC
  Filled 2016-01-31: qty 1

## 2016-01-31 MED ORDER — EPHEDRINE 5 MG/ML INJ
INTRAVENOUS | Status: AC
  Filled 2016-01-31: qty 10

## 2016-01-31 MED ORDER — FENTANYL CITRATE (PF) 100 MCG/2ML IJ SOLN
INTRAMUSCULAR | Status: AC
  Filled 2016-01-31: qty 2

## 2016-01-31 MED ORDER — TRAMADOL HCL 50 MG PO TABS: 50.0000 mg | ORAL_TABLET | Freq: Four times a day (QID) | ORAL | 0 refills | Status: DC | PRN

## 2016-01-31 MED ORDER — MEPERIDINE HCL 25 MG/ML IJ SOLN
6.2500 mg | INTRAMUSCULAR | Status: DC | PRN
  Filled 2016-01-31: qty 1

## 2016-01-31 MED ORDER — ONDANSETRON HCL 4 MG/2ML IJ SOLN
INTRAMUSCULAR | Status: DC | PRN
  Administered 2016-01-31: 4 mg via INTRAVENOUS

## 2016-01-31 MED ORDER — EPHEDRINE SULFATE-NACL 50-0.9 MG/10ML-% IV SOSY
PREFILLED_SYRINGE | INTRAVENOUS | Status: DC | PRN
  Administered 2016-01-31: 10 mg via INTRAVENOUS

## 2016-01-31 MED ORDER — DEXAMETHASONE SODIUM PHOSPHATE 10 MG/ML IJ SOLN
INTRAMUSCULAR | Status: AC
  Filled 2016-01-31: qty 1

## 2016-01-31 MED ORDER — DEXAMETHASONE SODIUM PHOSPHATE 4 MG/ML IJ SOLN
INTRAMUSCULAR | Status: DC | PRN
  Administered 2016-01-31: 10 mg via INTRAVENOUS

## 2016-01-31 MED ORDER — PHENAZOPYRIDINE HCL 200 MG PO TABS: 200.0000 mg | ORAL_TABLET | Freq: Three times a day (TID) | ORAL | 0 refills | Status: DC | PRN

## 2016-01-31 MED ORDER — PROPOFOL 10 MG/ML IV BOLUS
INTRAVENOUS | Status: AC
  Filled 2016-01-31: qty 20

## 2016-01-31 MED ORDER — METOCLOPRAMIDE HCL 5 MG/ML IJ SOLN
INTRAMUSCULAR | Status: DC | PRN
  Administered 2016-01-31: 10 mg via INTRAVENOUS

## 2016-01-31 MED ORDER — PHENYLEPHRINE 40 MCG/ML (10ML) SYRINGE FOR IV PUSH (FOR BLOOD PRESSURE SUPPORT)
PREFILLED_SYRINGE | INTRAVENOUS | Status: AC
  Filled 2016-01-31: qty 10

## 2016-01-31 MED ORDER — ONDANSETRON HCL 4 MG/2ML IJ SOLN
INTRAMUSCULAR | Status: AC
Start: 2016-01-31 — End: 2016-01-31
  Filled 2016-01-31: qty 2

## 2016-01-31 MED ORDER — PHENAZOPYRIDINE HCL 200 MG PO TABS
200.0000 mg | ORAL_TABLET | Freq: Once | ORAL | Status: DC
  Filled 2016-01-31: qty 1

## 2016-01-31 SURGICAL SUPPLY — 27 items
BAG DRAIN URO-CYSTO SKYTR STRL (DRAIN) ×4 IMPLANT
BAG DRN UROCATH (DRAIN) ×2
BAG URINE DRAINAGE (UROLOGICAL SUPPLIES) ×4 IMPLANT
BLADE SURG 15 STRL LF DISP TIS (BLADE) IMPLANT
BLADE SURG 15 STRL SS (BLADE)
CATH FOLEY 3WAY 30CC 22FR (CATHETERS) ×4 IMPLANT
CATH URET 5FR 28IN OPEN ENDED (CATHETERS) ×4 IMPLANT
CLOTH BEACON ORANGE TIMEOUT ST (SAFETY) ×4 IMPLANT
ELECT REM PT RETURN 9FT ADLT (ELECTROSURGICAL) ×4
ELECTRODE REM PT RTRN 9FT ADLT (ELECTROSURGICAL) ×2 IMPLANT
EVACUATOR MICROVAS BLADDER (UROLOGICAL SUPPLIES) ×2 IMPLANT
GLOVE BIO SURGEON STRL SZ7.5 (GLOVE) ×4 IMPLANT
GOWN STRL REUS W/ TWL XL LVL3 (GOWN DISPOSABLE) ×4 IMPLANT
GOWN STRL REUS W/TWL XL LVL3 (GOWN DISPOSABLE) ×4
GUIDEWIRE 0.038 PTFE COATED (WIRE) IMPLANT
GUIDEWIRE ANG ZIPWIRE 038X150 (WIRE) IMPLANT
GUIDEWIRE STR DUAL SENSOR (WIRE) ×4 IMPLANT
HOLDER FOLEY CATH W/STRAP (MISCELLANEOUS) ×2 IMPLANT
IV NS IRRIG 3000ML ARTHROMATIC (IV SOLUTION) ×12 IMPLANT
KIT ROOM TURNOVER WOR (KITS) ×4 IMPLANT
MANIFOLD NEPTUNE II (INSTRUMENTS) ×2 IMPLANT
PACK CYSTO (CUSTOM PROCEDURE TRAY) ×4 IMPLANT
SUT ETHILON 3 0 PS 1 (SUTURE) IMPLANT
SYR 30ML LL (SYRINGE) IMPLANT
TUBE CONNECTING 12'X1/4 (SUCTIONS)
TUBE CONNECTING 12X1/4 (SUCTIONS) IMPLANT
WATER STERILE IRR 500ML POUR (IV SOLUTION) ×2 IMPLANT

## 2016-01-31 NOTE — Transfer of Care (Signed)
  Last Vitals:  Vitals:   01/31/16 1035 01/31/16 1323  BP: (!) 154/101   Pulse:  (P) 81  Resp: 18 (!) (P) 22  Temp: 36.8 C (P) 36.5 C    Last Pain:  Vitals:   01/31/16 1035  TempSrc: Oral      Patients Stated Pain Goal: 6 (01/31/16 1110)  Immediate Anesthesia Transfer of Care Note  Patient: [name]  Procedure(s) Performed: Procedure(s) (LRB): TRANSURETHRAL RESECTION OF BLADDER TUMOR (TURBT) (Left) CYSTOSCOPY WITH RETROGRADE PYELOGRAM (Left)  Patient Location: PACU  Anesthesia Type: General  Level of Consciousness: awake, alert  and oriented  Airway & Oxygen Therapy: Patient Spontanous Breathing and Patient connected to face mask oxygen  Post-op Assessment: Report given to PACU RN and Post -op Vital signs reviewed and stable  Post vital signs: Reviewed and stable  Complications: No apparent anesthesia complications

## 2016-01-31 NOTE — Anesthesia Preprocedure Evaluation (Addendum)
Anesthesia Evaluation  Patient identified by MRN, date of birth, ID band Patient awake    Reviewed: Allergy & Precautions, H&P , NPO status , Patient's Chart, lab work & pertinent test results  Airway Mallampati: II  TM Distance: >3 FB Neck ROM: Full    Dental  (+) Dental Advisory Given   Pulmonary shortness of breath and with exertion, former smoker,    breath sounds clear to auscultation       Cardiovascular hypertension, Pt. on medications + CAD, + Past MI and + CABG   Rhythm:Irregular Rate:Normal  PVC's with pauses on EKG   Neuro/Psych negative neurological ROS  negative psych ROS   GI/Hepatic Neg liver ROS, hiatal hernia, GERD  Medicated,  Endo/Other  negative endocrine ROS  Renal/GU negative Renal ROS     Musculoskeletal negative musculoskeletal ROS (+)   Abdominal   Peds  Hematology negative hematology ROS (+)   Anesthesia Other Findings   Reproductive/Obstetrics negative OB ROS                            Anesthesia Physical  Anesthesia Plan  ASA: III  Anesthesia Plan: General   Post-op Pain Management:    Induction: Intravenous  Airway Management Planned: LMA  Additional Equipment:   Intra-op Plan:   Post-operative Plan:   Informed Consent: I have reviewed the patients History and Physical, chart, labs and discussed the procedure including the risks, benefits and alternatives for the proposed anesthesia with the patient or authorized representative who has indicated his/her understanding and acceptance.   Dental advisory given  Plan Discussed with: CRNA  Anesthesia Plan Comments: (2x PIV)        Anesthesia Quick Evaluation

## 2016-01-31 NOTE — H&P (Signed)
f/u for transitional cell carcinoma  HPI: Angel Cooper is a 83 year-old male established patient who is here surveillance of bladder cancer.  The patient had a right nephrouretectomy.   Tumor Pathology: T3b HG.   The patient did not have any post-operative bladder instillations.   His last cysto was approximately 12/28/2014. His cystoscopy demonstrated NED.   His last radiologic test to evaluate the kidneys was approximately 12/25/2015. Imaging results: CT - NED. The patient had labs prior to his office visit today. Pertinent labs: BUN/CR- 49/1.6 (1.5), LFTs - wnl.   He is not having pain in new locations. He has not had blood in his urine recently. He has not recently had unwanted weight loss.     ALLERGIES: Aspirin TABS Benadryl TABS    MEDICATIONS: AmLODIPine Besylate 2.5 MG Oral Tablet Oral  Furosemide 40 MG Oral Tablet Oral  Omeprazole 20 MG Oral Capsule Delayed Release Oral  Tylenol TABS 0 Oral  Vitamin B12 TABS Oral     GU PSH: Cysto Uretero Biopsy Fulgura - 2015, 2014 Cystoscopy Insert Stent - 2015, 2014 Lap Radical Nephrectomy - 2015 Lap Simple Nephrectomy - 2015 Laparoscopy; Lymphadenectomy - 2015 Locm 300-399Mg /Ml Iodine,1Ml - 12/24/2015 Repair Bladder Wound - 2015      PSH Notes: Kidney Surgery Laparoscopic Radical Nephrectomy, Laparoscopy With Bilateral Total Pelvic Lymphadenectomy, Bladder Cystorrhaphy - Simple, Kidney Surgery Laparoscopic Nephrectomy, Cystoscopy With Ureteroscopy For Biopsy Right, Cystoscopy With Insertion Of Ureteral Stent Right, Cystoscopy With Insertion Of Ureteral Stent Right, Cystoscopy With Ureteroscopy For Biopsy Right, Heart Surgery   NON-GU PSH: None   GU PMH: Malig Neo Right renal pelvis, Malignant tumor renal pelvis, right - 12/24/2014 Disorder Kidney/ureter, Unspec, Ureteral mass - 123456 Periumbilical pain, Periumbilical abdominal pain - 2014      PMH Notes: Cystoscopy:  06/09/13: Negative for evidence of recurrence.  09/12/13:  Negative  12/19/13: negative  05/2014: negative  11/16 - negative   Imaging:  CXR, 04/13/13: no evidence of metastatic disease. Hiatal hernia.  CXR 05/2014: NED  CXR 11/16: NED  CT - abdomen/pelvis (renal mass protocol), 06/03/13: No evidence of metastatic disease, no delayed images obtained.  CT ab/pelv, hematuria protocol 12/10/13: No evidence of metastatic disease  CT ab/pelv, 06/12/14 - NED  CT ab/pelv, 12/18/14 - NED   Labs:  BUN/creatinine, 05/09/13: 31/1.5 LFTs, 05/09/13: wnl  BUN/Cr 11/2013: 19/1.6 LFTs 11/2013: wnl  BUN/Cr 05/2014: 28/1.40 LFTs 05/2014: wnl  BUN/Cr 11/2014: 19/1.5 LFTs 11/2014: wnl     NON-GU PMH: Encounter for general adult medical examination without abnormal findings, Encounter for preventive health examination - 2016 Abnormal weight loss, Weight loss, unintentional - 2014 Anxiety, Anxiety - 2014 Atherosclerotic heart disease of native coronary artery without angina pectoris, Coronary artery disease - 2014 Myocardial Infarction, History of acute myocardial infarction - 2014 Personal history of other diseases of the circulatory system, History of hypertension - 2014, History of cardiac disorder, - 2014 Personal history of other diseases of the digestive system, History of gastric ulcer - 2014, History of esophageal reflux, - 2014 Personal history of other diseases of the musculoskeletal system and connective tissue, History of arthritis - 2014 Personal history of other endocrine, nutritional and metabolic disease, History of hypercholesterolemia - 2014    FAMILY HISTORY: malignant neoplasm - Runs In Family   SOCIAL HISTORY: Marital Status: Married     Notes: Former smoker, Death in the family, father, Caffeine use, Retired, Two children, Death in the family, mother   REVIEW OF SYSTEMS:    GU  Review Male:   Patient reports get up at night to urinate and erection problems. Patient denies frequent urination, hard to postpone urination, burning/ pain with  urination, leakage of urine, stream starts and stops, trouble starting your stream, have to strain to urinate , and penile pain.  Gastrointestinal (Upper):   Patient denies nausea, vomiting, and indigestion/ heartburn.  Gastrointestinal (Lower):   Patient reports constipation. Patient denies diarrhea.  Constitutional:   Patient reports fatigue. Patient denies night sweats, fever, and weight loss.  Skin:   Patient reports itching. Patient denies skin rash/ lesion.  Eyes:   Patient denies blurred vision and double vision.  Ears/ Nose/ Throat:   Patient denies sore throat and sinus problems.  Hematologic/Lymphatic:   Patient denies swollen glands and easy bruising.  Cardiovascular:   Patient denies leg swelling and chest pains.  Respiratory:   Patient denies cough and shortness of breath.  Endocrine:   Patient denies excessive thirst.  Musculoskeletal:   Patient reports back pain and joint pain.   Neurological:   Patient denies headaches and dizziness.  Psychologic:   Patient denies depression and anxiety.   VITAL SIGNS:      01/01/2016 08:26 AM  Weight 155 lb / 70.31 kg  BP 153/61 mmHg  Pulse 39 /min  Temperature 97.4 F / 36 C   MULTI-SYSTEM PHYSICAL EXAMINATION:    Constitutional: Well-nourished. No physical deformities. Normally developed. Good grooming.  Respiratory: No labored breathing, no use of accessory muscles. Clear to auscultation  Cardiovascular: Normal temperature, normal extremity pulses, no swelling, no varicosities.regular rate and rhythm  Gastrointestinal: No mass, no tenderness, no rigidity, non obese abdomen.  Musculoskeletal: Normal gait and station of head and neck.     PAST DATA REVIEWED:  Source Of History:  Patient  Records Review:   Previous Patient Records   PROCEDURES:         Flexible Cystoscopy - 52000  Risks, benefits, and some of the potential complications of the procedure were discussed at length with the patient including infection, bleeding,  voiding discomfort, urinary retention, fever, chills, sepsis, and others. All questions were answered. Informed consent was obtained. Antibiotic prophylaxis was given. Sterile technique and intraurethral analgesia were used.  Meatus:  Normal size. Normal location. Normal condition.  Urethra:  No strictures.  External Sphincter:  Normal.  Verumontanum:  Normal.  Prostate:  Non-obstructing. No hyperplasia.  Bladder Neck:  Non-obstructing.  Ureteral Orifices:  Normal location. Normal size. Normal shape. Effluxed clear urine.  Bladder:  The patient has a 5-10 mm papillary lesion the left lateral wall posterior to the trigone.      The lower urinary tract was carefully examined. The procedure was well-tolerated and without complications. Antibiotic instructions were given. Instructions were given to call the office immediately for bloody urine, difficulty urinating, urinary retention, painful or frequent urination, fever, chills, nausea, vomiting or other illness. The patient stated that he understood these instructions and would comply with them.         Urinalysis Dipstick Dipstick Cont'd  Color: Yellow Bilirubin: Neg  Appearance: Clear Ketones: Neg  Specific Gravity: 1.015 Blood: Neg  pH: 6.0 Protein: Trace  Glucose: Neg Urobilinogen: 0.2    Nitrites: Neg    Leukocyte Esterase: Neg    ASSESSMENT:      ICD-10 Details  1 GU:   Bladder Cancer Lateral - C67.2 The patient has a history of upper tract transitional cell carcinoma in 2014. He has a recurrence in his bladder today unfortunately.  PLAN:           Orders Labs Urine Culture and Sensitivity          Document Letter(s):  Created for Patient: Clinical Summary         Notes:   I recommended that the patient strongly consider removal of his bladder tumor. It is very small and would be quite easy to resect. We will also plan to perform a left retrograde pyelogram same time.6 I discussed the surgery with the patient in detail  including the risks and the benefits. We will try to get this taken care of as soon as possible. I would like the patient to be cleared by Dr. Laurann Montana prior to anesthesia.

## 2016-01-31 NOTE — Anesthesia Postprocedure Evaluation (Addendum)
Anesthesia Post Note  Patient: HILAIRE EGERT  Procedure(s) Performed: Procedure(s) (LRB): TRANSURETHRAL RESECTION OF BLADDER TUMOR (TURBT) (N/A) CYSTOSCOPY WITH RETROGRADE PYELOGRAM (Left)  Patient location during evaluation: PACU Anesthesia Type: General Level of consciousness: awake and alert Pain management: pain level controlled Vital Signs Assessment: post-procedure vital signs reviewed and stable Respiratory status: spontaneous breathing, nonlabored ventilation, respiratory function stable and patient connected to nasal cannula oxygen Cardiovascular status: blood pressure returned to baseline and stable Postop Assessment: no signs of nausea or vomiting Anesthetic complications: no       Last Vitals:  Vitals:   01/31/16 1400 01/31/16 1415  BP: (!) 136/55 (!) 129/49  Pulse: 80 80  Resp: 18 11  Temp:      Last Pain:  Vitals:   01/31/16 1345  TempSrc:   PainSc: 0-No pain                 Haislee Corso S

## 2016-01-31 NOTE — Anesthesia Procedure Notes (Signed)
Procedure Name: LMA Insertion Date/Time: 01/31/2016 12:38 PM Performed by: Myrtie Soman Pre-anesthesia Checklist: Patient identified, Emergency Drugs available, Suction available and Patient being monitored Patient Re-evaluated:Patient Re-evaluated prior to inductionOxygen Delivery Method: Circle system utilized Preoxygenation: Pre-oxygenation with 100% oxygen Intubation Type: IV induction Ventilation: Mask ventilation without difficulty LMA: LMA inserted LMA Size: 4.0 Number of attempts: 1 Airway Equipment and Method: Bite block Placement Confirmation: positive ETCO2 Tube secured with: Tape Dental Injury: Teeth and Oropharynx as per pre-operative assessment

## 2016-01-31 NOTE — Op Note (Signed)
Preoperative diagnosis:  1. Recurrent bladder tumor 1.0 cm - left bladder wall  Postoperative diagnosis:  1. same   Procedure: 1. Left retrograde pyelogram with interpretation 2. Transurethral resection of bladder tumor, 1 cm in size, left bladder wall 3. Postoperative instillation of mitomycin-C  Surgeon: [name], MD  Anesthesia: General  Complications: None  Intraoperative findings: The left retrograde pyelogram was performed using 10 mL upon the precontrast. The left ureteral orifice was cannulated with a 5 [place] open-ended catheter and the contrast instilled under fluoroscopic guidance. This demonstrated a normal caliber ureter with no filling defects and sharp calyces. The patient had a 1 cm lesion just posterior to the left ureteral orifice.  EBL: Minimal  Specimens: Bladder tumor   Indication: Angel Cooper is a 83 y.o. 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 On cystoscopic surveillance the patient was noted to have recurrence at the left lateral wall.  After reviewing the management options for treatment, he elected to proceed with the above surgical procedure(s). We have discussed the potential benefits and risks of the procedure, side effects of the proposed treatment, the likelihood of the patient achieving the goals of the procedure, and any potential problems that might occur during the procedure or recuperation. Informed consent has been obtained.  Description of procedure:  The patient was taken to the operating room and general anesthesia was induced.  The patient was placed in the dorsal lithotomy position, prepped and draped in the usual sterile fashion, and preoperative antibiotics were administered. A preoperative time-out was performed.   A 21 French 30 cystoscope which only passed to the patient's urethra and into the bladder under visual guidance. I then exchanged the 30 lens for the 70 lens and  performed cystoscopy and a 360 fashion. The right ureteral orifice was absent. There was a 1 cm broad-based lesion, non-invasive appearing on the left bladder wall. I then exchanged the 70 lens for the 30 lens and performed a retrograde pyelogram the left ureter with the above findings. Then using the biopsy forceps I was able to remove the bladder tumor in its entirety. The area including the base and surrounding tissue was copiously fulgurated. Hemostasis was noted to be outstanding.  I then inserted a 18 French Foley catheter and instilled 10 mL of sterile water. I inserted a BNO suppository due to the patient's rectum. He subsequently experienced return to PACU stable condition.  In the PACU I instilled 40 mg mitomycin-C in 40 mL's of normal saline. This was allowed to dwell for 60-90 minutes prior to removing the mitomycin-C and Foley catheter.  The patient tolerated the procedure well. There are no untoward complications. The patient has scheduled follow-up in 2 weeks.  [name], M.D.

## 2016-01-31 NOTE — Discharge Instructions (Signed)
Post Anesthesia Home Care Instructions  Activity: Get plenty of rest for the remainder of the day. A responsible adult should stay with you for 24 hours following the procedure.  For the next 24 hours, DO NOT: -Drive a car -Operate machinery -Drink alcoholic beverages -Take any medication unless instructed by your physician -Make any legal decisions or sign important papers.  Meals: Start with liquid foods such as gelatin or soup. Progress to regular foods as tolerated. Avoid greasy, spicy, heavy foods. If nausea and/or vomiting occur, drink only clear liquids until the nausea and/or vomiting subsides. Call your physician if vomiting continues.  Special Instructions/Symptoms: Your throat may feel dry or sore from the anesthesia or the breathing tube placed in your throat during surgery. If this causes discomfort, gargle with warm salt water. The discomfort should disappear within 24 hours.  If you had a scopolamine patch placed behind your ear for the management of post- operative nausea and/or vomiting:  1. The medication in the patch is effective for 72 hours, after which it should be removed.  Wrap patch in a tissue and discard in the trash. Wash hands thoroughly with soap and water. 2. You may remove the patch earlier than 72 hours if you experience unpleasant side effects which may include dry mouth, dizziness or visual disturbances. 3. Avoid touching the patch. Wash your hands with soap and water after contact with the patch.   Transurethral Resection of Bladder Tumor (TURBT) or Bladder Biopsy   Definition:  Transurethral Resection of the Bladder Tumor is a surgical procedure used to diagnose and remove tumors within the bladder. TURBT is the most common treatment for early stage bladder cancer.  General instructions:     Your recent bladder surgery requires very little post hospital care but some definite precautions.  Despite the fact that no skin incisions were used, the  area around the bladder incisions are raw and covered with scabs to promote healing and prevent bleeding. Certain precautions are needed to insure that the scabs are not disturbed over the next 2-4 weeks while the healing proceeds.  Because the raw surface inside your bladder and the irritating effects of urine you may expect frequency of urination and/or urgency (a stronger desire to urinate) and perhaps even getting up at night more often. This will usually resolve or improve slowly over the healing period. You may see some blood in your urine over the first 6 weeks. Do not be alarmed, even if the urine was clear for a while. Get off your feet and drink lots of fluids until clearing occurs. If you start to pass clots or don't improve call us.  Diet:  You may return to your normal diet immediately. Because of the raw surface of your bladder, alcohol, spicy foods, foods high in acid and drinks with caffeine may cause irritation or frequency and should be used in moderation. To keep your urine flowing freely and avoid constipation, drink plenty of fluids during the day (8-10 glasses). Tip: Avoid cranberry juice because it is very acidic.  Activity:  Your physical activity doesn't need to be restricted. However, if you are very active, you may see some blood in the urine. We suggest that you reduce your activity under the circumstances until the bleeding has stopped.  Bowels:  It is important to keep your bowels regular during the postoperative period. Straining with bowel movements can cause bleeding. A bowel movement every other day is reasonable. Use a mild laxative if needed, such as   milk of magnesia 2-3 tablespoons, or 2 Dulcolax tablets. Call if you continue to have problems. If you had been taking narcotics for pain, before, during or after your surgery, you may be constipated. Take a laxative if necessary.    Medication:  You should resume your pre-surgery medications unless told not to. In  addition you may be given an antibiotic to prevent or treat infection. Antibiotics are not always necessary. All medication should be taken as prescribed until the bottles are finished unless you are having an unusual reaction to one of the drugs.      

## 2016-02-01 ENCOUNTER — Encounter (HOSPITAL_BASED_OUTPATIENT_CLINIC_OR_DEPARTMENT_OTHER): Payer: Self-pay | Admitting: Urology

## 2016-02-22 ENCOUNTER — Encounter (HOSPITAL_COMMUNITY): Payer: Medicare Other

## 2016-05-09 ENCOUNTER — Other Ambulatory Visit: Payer: Self-pay | Admitting: Nurse Practitioner

## 2016-05-09 ENCOUNTER — Ambulatory Visit
Admission: RE | Admit: 2016-05-09 | Discharge: 2016-05-09 | Disposition: A | Discharge status: Home / Self Care | Payer: Medicare Other | Source: Ambulatory Visit | Attending: Nurse Practitioner | Admitting: Nurse Practitioner

## 2016-05-09 DIAGNOSIS — R079 Chest pain, unspecified: Secondary | ICD-10-CM

## 2016-05-09 MED ORDER — IOPAMIDOL (ISOVUE-370) INJECTION 76%
50.0000 mL | Freq: Once | INTRAVENOUS | Status: AC | PRN
  Administered 2016-05-09: 50 mL via INTRAVENOUS

## 2016-05-20 ENCOUNTER — Ambulatory Visit
Admission: RE | Admit: 2016-05-20 | Discharge: 2016-05-20 | Disposition: A | Discharge status: Home / Self Care | Payer: Medicare Other | Source: Ambulatory Visit | Attending: Urology | Admitting: Urology

## 2016-05-20 ENCOUNTER — Other Ambulatory Visit: Payer: Self-pay | Admitting: Urology

## 2016-05-20 DIAGNOSIS — R059 Cough, unspecified: Secondary | ICD-10-CM

## 2016-05-20 DIAGNOSIS — R05 Cough: Secondary | ICD-10-CM

## 2016-05-20 DIAGNOSIS — J40 Bronchitis, not specified as acute or chronic: Secondary | ICD-10-CM

## 2016-05-29 ENCOUNTER — Other Ambulatory Visit: Payer: Self-pay | Admitting: Nurse Practitioner

## 2016-05-29 ENCOUNTER — Ambulatory Visit
Admission: RE | Admit: 2016-05-29 | Discharge: 2016-05-29 | Disposition: A | Discharge status: Home / Self Care | Payer: Medicare Other | Source: Ambulatory Visit | Attending: Nurse Practitioner | Admitting: Nurse Practitioner

## 2016-05-29 DIAGNOSIS — M13142 Monoarthritis, not elsewhere classified, left hand: Secondary | ICD-10-CM

## 2016-06-02 ENCOUNTER — Encounter (HOSPITAL_BASED_OUTPATIENT_CLINIC_OR_DEPARTMENT_OTHER)
Admission: RE | Disposition: A | Discharge status: Home / Self Care | Payer: Self-pay | Source: Ambulatory Visit | Attending: Orthopedic Surgery

## 2016-06-02 ENCOUNTER — Ambulatory Visit (HOSPITAL_BASED_OUTPATIENT_CLINIC_OR_DEPARTMENT_OTHER): Payer: Medicare Other | Admitting: Certified Registered"

## 2016-06-02 ENCOUNTER — Other Ambulatory Visit: Payer: Self-pay | Admitting: Orthopedic Surgery

## 2016-06-02 ENCOUNTER — Ambulatory Visit (HOSPITAL_BASED_OUTPATIENT_CLINIC_OR_DEPARTMENT_OTHER)
Admission: RE | Admit: 2016-06-02 | Discharge: 2016-06-02 | Disposition: A | Discharge status: Home / Self Care | Payer: Medicare Other | Source: Ambulatory Visit | Attending: Orthopedic Surgery | Admitting: Orthopedic Surgery

## 2016-06-02 ENCOUNTER — Encounter (HOSPITAL_BASED_OUTPATIENT_CLINIC_OR_DEPARTMENT_OTHER): Payer: Self-pay | Admitting: Certified Registered"

## 2016-06-02 DIAGNOSIS — I2510 Atherosclerotic heart disease of native coronary artery without angina pectoris: Secondary | ICD-10-CM | POA: Diagnosis not present

## 2016-06-02 DIAGNOSIS — M10042 Idiopathic gout, left hand: Secondary | ICD-10-CM | POA: Insufficient documentation

## 2016-06-02 DIAGNOSIS — K219 Gastro-esophageal reflux disease without esophagitis: Secondary | ICD-10-CM | POA: Diagnosis not present

## 2016-06-02 DIAGNOSIS — I130 Hypertensive heart and chronic kidney disease with heart failure and stage 1 through stage 4 chronic kidney disease, or unspecified chronic kidney disease: Secondary | ICD-10-CM | POA: Insufficient documentation

## 2016-06-02 DIAGNOSIS — N183 Chronic kidney disease, stage 3 (moderate): Secondary | ICD-10-CM | POA: Diagnosis not present

## 2016-06-02 DIAGNOSIS — Z87891 Personal history of nicotine dependence: Secondary | ICD-10-CM | POA: Diagnosis not present

## 2016-06-02 DIAGNOSIS — Z85528 Personal history of other malignant neoplasm of kidney: Secondary | ICD-10-CM | POA: Insufficient documentation

## 2016-06-02 DIAGNOSIS — I252 Old myocardial infarction: Secondary | ICD-10-CM | POA: Insufficient documentation

## 2016-06-02 DIAGNOSIS — Z951 Presence of aortocoronary bypass graft: Secondary | ICD-10-CM | POA: Diagnosis not present

## 2016-06-02 DIAGNOSIS — Z79899 Other long term (current) drug therapy: Secondary | ICD-10-CM | POA: Insufficient documentation

## 2016-06-02 DIAGNOSIS — Z905 Acquired absence of kidney: Secondary | ICD-10-CM | POA: Insufficient documentation

## 2016-06-02 DIAGNOSIS — I509 Heart failure, unspecified: Secondary | ICD-10-CM | POA: Insufficient documentation

## 2016-06-02 DIAGNOSIS — M25442 Effusion, left hand: Secondary | ICD-10-CM | POA: Diagnosis present

## 2016-06-02 HISTORY — PX: INCISION AND DRAINAGE ABSCESS: SHX5864

## 2016-06-02 LAB — POCT I-STAT, CHEM 8
BUN: 35 mg/dL — ABNORMAL HIGH (ref 6–20)
Calcium, Ion: 1.15 mmol/L (ref 1.15–1.40)
Chloride: 96 mmol/L — ABNORMAL LOW (ref 101–111)
Creatinine, Ser: 1.8 mg/dL — ABNORMAL HIGH (ref 0.61–1.24)
Glucose, Bld: 116 mg/dL — ABNORMAL HIGH (ref 65–99)
HEMATOCRIT: 41 % (ref 39.0–52.0)
HEMOGLOBIN: 13.9 g/dL (ref 13.0–17.0)
POTASSIUM: 3.7 mmol/L (ref 3.5–5.1)
Sodium: 137 mmol/L (ref 135–145)
TCO2: 31 mmol/L (ref 0–100)

## 2016-06-02 SURGERY — INCISION AND DRAINAGE, ABSCESS
Anesthesia: Monitor Anesthesia Care | Laterality: Left

## 2016-06-02 MED ORDER — FENTANYL CITRATE (PF) 100 MCG/2ML IJ SOLN
INTRAMUSCULAR | Status: DC | PRN
  Administered 2016-06-02: 50 ug via INTRAVENOUS

## 2016-06-02 MED ORDER — CEFAZOLIN SODIUM-DEXTROSE 2-3 GM-% IV SOLR
INTRAVENOUS | Status: DC | PRN
Start: 2016-06-02 — End: 2016-06-02
  Administered 2016-06-02: 2 g via INTRAVENOUS

## 2016-06-02 MED ORDER — BUPIVACAINE HCL (PF) 0.25 % IJ SOLN
INTRAMUSCULAR | Status: AC
  Filled 2016-06-02: qty 30

## 2016-06-02 MED ORDER — CEFAZOLIN SODIUM-DEXTROSE 2-4 GM/100ML-% IV SOLN
INTRAVENOUS | Status: AC
  Filled 2016-06-02: qty 100

## 2016-06-02 MED ORDER — OXYCODONE HCL 5 MG PO TABS: 5.0000 mg | ORAL_TABLET | Freq: Once | ORAL | Status: DC | PRN

## 2016-06-02 MED ORDER — PROPOFOL 10 MG/ML IV BOLUS
INTRAVENOUS | Status: DC | PRN
  Administered 2016-06-02: 30 mg via INTRAVENOUS

## 2016-06-02 MED ORDER — LACTATED RINGERS IV SOLN
INTRAVENOUS | Status: DC
  Administered 2016-06-02: 12:00:00 via INTRAVENOUS

## 2016-06-02 MED ORDER — KETOROLAC TROMETHAMINE 30 MG/ML IJ SOLN: INTRAMUSCULAR | Status: DC | PRN

## 2016-06-02 MED ORDER — ONDANSETRON HCL 4 MG/2ML IJ SOLN
INTRAMUSCULAR | Status: AC
  Filled 2016-06-02: qty 2

## 2016-06-02 MED ORDER — OXYCODONE HCL 5 MG/5ML PO SOLN: 5.0000 mg | Freq: Once | ORAL | Status: DC | PRN

## 2016-06-02 MED ORDER — LIDOCAINE HCL (PF) 0.5 % IJ SOLN
INTRAMUSCULAR | Status: AC
  Filled 2016-06-02: qty 50

## 2016-06-02 MED ORDER — PROPOFOL 500 MG/50ML IV EMUL
INTRAVENOUS | Status: DC | PRN
  Administered 2016-06-02: 25 ug/kg/min via INTRAVENOUS

## 2016-06-02 MED ORDER — BUPIVACAINE HCL (PF) 0.25 % IJ SOLN
INTRAMUSCULAR | Status: DC | PRN
  Administered 2016-06-02: 7 mL

## 2016-06-02 MED ORDER — FENTANYL CITRATE (PF) 100 MCG/2ML IJ SOLN
25.0000 ug | INTRAMUSCULAR | Status: DC | PRN
Start: 2016-06-02 — End: 2016-06-02

## 2016-06-02 MED ORDER — POVIDONE-IODINE 7.5 % EX SOLN: Freq: Once | CUTANEOUS | Status: DC

## 2016-06-02 MED ORDER — LIDOCAINE HCL (PF) 0.5 % IJ SOLN
INTRAMUSCULAR | Status: DC | PRN
  Administered 2016-06-02: 25 mL via INTRAVENOUS

## 2016-06-02 MED ORDER — ONDANSETRON HCL 4 MG/2ML IJ SOLN
INTRAMUSCULAR | Status: DC | PRN
  Administered 2016-06-02: 4 mg via INTRAVENOUS

## 2016-06-02 MED ORDER — SULFAMETHOXAZOLE-TRIMETHOPRIM 800-160 MG PO TABS: 1.0000 | ORAL_TABLET | Freq: Two times a day (BID) | ORAL | Status: DC

## 2016-06-02 MED ORDER — OXYCODONE-ACETAMINOPHEN 5-325 MG PO TABS: 1.0000 | ORAL_TABLET | ORAL | 0 refills | Status: DC | PRN

## 2016-06-02 MED ORDER — PROPOFOL 10 MG/ML IV BOLUS
INTRAVENOUS | Status: AC
  Filled 2016-06-02: qty 20

## 2016-06-02 MED ORDER — FENTANYL CITRATE (PF) 100 MCG/2ML IJ SOLN
INTRAMUSCULAR | Status: AC
  Filled 2016-06-02: qty 2

## 2016-06-02 MED ORDER — ONDANSETRON HCL 4 MG/2ML IJ SOLN: 4.0000 mg | Freq: Four times a day (QID) | INTRAMUSCULAR | Status: DC | PRN

## 2016-06-02 SURGICAL SUPPLY — 48 items
BAG DECANTER FOR FLEXI CONT (MISCELLANEOUS) IMPLANT
BLADE MINI RND TIP GREEN BEAV (BLADE) IMPLANT
BLADE SURG 15 STRL LF DISP TIS (BLADE) ×1 IMPLANT
BLADE SURG 15 STRL SS (BLADE) ×3
BNDG CMPR 9X4 STRL LF SNTH (GAUZE/BANDAGES/DRESSINGS)
BNDG COHESIVE 1X5 TAN STRL LF (GAUZE/BANDAGES/DRESSINGS) ×2 IMPLANT
BNDG COHESIVE 3X5 TAN STRL LF (GAUZE/BANDAGES/DRESSINGS) IMPLANT
BNDG ESMARK 4X9 LF (GAUZE/BANDAGES/DRESSINGS) IMPLANT
BNDG GAUZE ELAST 4 BULKY (GAUZE/BANDAGES/DRESSINGS) IMPLANT
CHLORAPREP W/TINT 26ML (MISCELLANEOUS) ×3 IMPLANT
CORDS BIPOLAR (ELECTRODE) ×1 IMPLANT
COVER BACK TABLE 60X90IN (DRAPES) ×3 IMPLANT
COVER MAYO STAND STRL (DRAPES) ×3 IMPLANT
CUFF TOURNIQUET SINGLE 18IN (TOURNIQUET CUFF) ×2 IMPLANT
DRAPE EXTREMITY T 121X128X90 (DRAPE) ×3 IMPLANT
DRAPE SURG 17X23 STRL (DRAPES) ×3 IMPLANT
GAUZE PACKING IODOFORM 1/4X15 (GAUZE/BANDAGES/DRESSINGS) IMPLANT
GAUZE SPONGE 4X4 12PLY STRL (GAUZE/BANDAGES/DRESSINGS) ×3 IMPLANT
GAUZE XEROFORM 1X8 LF (GAUZE/BANDAGES/DRESSINGS) ×3 IMPLANT
GLOVE BIOGEL PI IND STRL 7.0 (GLOVE) IMPLANT
GLOVE BIOGEL PI IND STRL 8.5 (GLOVE) ×1 IMPLANT
GLOVE BIOGEL PI INDICATOR 7.0 (GLOVE) ×4
GLOVE BIOGEL PI INDICATOR 8.5 (GLOVE) ×2
GLOVE ECLIPSE 6.5 STRL STRAW (GLOVE) ×2 IMPLANT
GLOVE SURG ORTHO 8.0 STRL STRW (GLOVE) ×3 IMPLANT
GOWN STRL REUS W/ TWL LRG LVL3 (GOWN DISPOSABLE) ×1 IMPLANT
GOWN STRL REUS W/TWL LRG LVL3 (GOWN DISPOSABLE) ×3
GOWN STRL REUS W/TWL XL LVL3 (GOWN DISPOSABLE) ×3 IMPLANT
LOOP VESSEL MAXI BLUE (MISCELLANEOUS) ×2 IMPLANT
NDL PRECISIONGLIDE 27X1.5 (NEEDLE) IMPLANT
NEEDLE PRECISIONGLIDE 27X1.5 (NEEDLE) ×3 IMPLANT
NS IRRIG 1000ML POUR BTL (IV SOLUTION) ×3 IMPLANT
PACK BASIN DAY SURGERY FS (CUSTOM PROCEDURE TRAY) ×3 IMPLANT
PAD CAST 3X4 CTTN HI CHSV (CAST SUPPLIES) IMPLANT
PADDING CAST ABS 4INX4YD NS (CAST SUPPLIES) ×2
PADDING CAST ABS COTTON 4X4 ST (CAST SUPPLIES) ×1 IMPLANT
PADDING CAST COTTON 3X4 STRL (CAST SUPPLIES)
SPLINT PLASTER CAST XFAST 3X15 (CAST SUPPLIES) IMPLANT
SPLINT PLASTER XTRA FASTSET 3X (CAST SUPPLIES)
STOCKINETTE 4X48 STRL (DRAPES) ×3 IMPLANT
SUT ETHILON 4 0 PS 2 18 (SUTURE) ×3 IMPLANT
SWAB COLLECTION DEVICE MRSA (MISCELLANEOUS) ×2 IMPLANT
SWAB CULTURE ESWAB REG 1ML (MISCELLANEOUS) ×2 IMPLANT
SYR BULB 3OZ (MISCELLANEOUS) ×3 IMPLANT
SYR CONTROL 10ML LL (SYRINGE) ×2 IMPLANT
TOWEL OR 17X24 6PK STRL BLUE (TOWEL DISPOSABLE) ×3 IMPLANT
TUBE FEEDING ENTERAL 5FR 16IN (TUBING) IMPLANT
UNDERPAD 30X30 (UNDERPADS AND DIAPERS) ×3 IMPLANT

## 2016-06-02 NOTE — Discharge Instructions (Addendum)

## 2016-06-02 NOTE — Op Note (Signed)
Dictation Number [id number]

## 2016-06-02 NOTE — H&P (Signed)
Angel Cooper is an 83 y.o. male.   Chief Complaint: swelling PIP left small finger HPI: Angel Cooper is a 83 year old right-hand-dominant male referred by Dr. Laurann Montana for consultation regarding pain swelling of his left small finger PIP joint area. He states this began approximately 1 week ago. He has no history of injury. He has been placed on a Medrol prednisone Dosepak which is giving him relief. He has been soaking it nice. He complains of an aching pain. He states it is improving. He is not complaining of any pain unless he tries to move the joint. He has a history of gout. He has not had any laboratory work done. He has x-rays done revealing a defect in the dorsal cortex of the PIP joint area and swelling of the joint with arthritic changes beneath. Is not had any fevers or chills. He is not complaining of any proximal pain swelling or increased warmth.He has been on prednisone. He states that it has not changed over the weekend. He has had his laboratory work done revealing that he has a slightly elevated white count of 14,000 but uric acid of 12.1. He continues to complain of no change in his symptoms. He has moderate pain.         Past Medical History:  Diagnosis Date  . Atrial fibrillation ([place])    2015  W/ RVR  . Bladder tumor   . CHF (congestive heart failure) ([place]) 2015  . CKD (chronic kidney disease), stage III   . Coronary artery disease    NO LONGER SEES CARDIOLOGIST - LAST SAW DR. [name] 2 OR MORE YRS AGO-PT SEES PCP DR [name]  . GERD (gastroesophageal reflux disease)   . Hiatal hernia   . History of colon polyps   . History of gastric ulcer    1989  peptic and duodenal  . History of MI (myocardial infarction)    1995  . History of non-ST elevation myocardial infarction (NSTEMI)    07-05-1999  s/p cabg  . Hyperlipidemia   . Hypertension   . OA (osteoarthritis)   . S/P CABG (coronary artery bypass graft)    06/ 2001  . 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 CARDIAC CATHETERIZATION  07/05/1999   non-q MI;  3vessel CAD w/ high grade pLAD and mLAD, subtotal ramus branch,  OM1 70-80%,  ostialCFX 50-60%,  dCFx 80%,  RI 99%,  total occluded pRCA and fills last by right-to-right collaterals,  pLM  20% narrowing;  severe inferoapical hypokinesis  . CATARACT EXTRACTION W/ INTRAOCULAR LENS  IMPLANT, BILATERAL    . CORONARY ARTERY BYPASS GRAFT  06/ 2001  dr [name] tright  . CYSTOSCOPY W/ RETROGRADES Left 01/31/2016   Procedure: CYSTOSCOPY WITH RETROGRADE PYELOGRAM;  Surgeon: [name], MD;  Location: [facility];  Service: Urology;  Laterality: Left;  . CYSTOSCOPY WITH RETROGRADE PYELOGRAM, URETEROSCOPY AND STENT PLACEMENT Right 12/03/2012   Procedure: CYSTOSCOPY WITH RIGHT RETROGRADE PYELOGRAM,RIGHT URETERAL WASHINGS, RIGHT URETERAL BIOPSY, RIGHT URETEROSCOPY AND STENT PLACEMENT;  Surgeon: [name], MD;  Location: [facility];  Service: Urology;  Laterality: Right;  . CYSTOSCOPY WITH RETROGRADE PYELOGRAM, URETEROSCOPY AND STENT PLACEMENT Right 12/15/2012   Procedure: CYSTOSCOPY WITH RIGHT RETROGRADE PYELOGRAM, POSSIBLE RIGHT URETEROSCOPY AND STENT PLACEMENT AND REMOVAL OF LEFT  STENT;  Surgeon: [name], MD;  Location: [facility];  Service: Urology;  Laterality: Right;  . CYSTOSCOPY WITH STENT PLACEMENT Right 01/24/2013   Procedure: CYSTOSCOPY WITH STENT PLACEMENT;  Surgeon: [name], MD;  Location: [facility];  Service: Urology;  Laterality: Right;  . ROBOT ASSITED LAPAROSCOPIC NEPHROURETERECTOMY Right 01/24/2013   Procedure: ROBOT ASSISTED LAPAROSCOPIC NEPHROURETERECTOMY WITH BILATERAL LYMPH NODE DISSECTION ;  Surgeon: [name], MD;  Location: [facility];  Service: Urology;  Laterality: Right;  right kidney  .  TRANSTHORACIC ECHOCARDIOGRAM  01/28/2013   mild LVH,  ef 45-50%, diffuse hypokinesis,  grade 1 diastolic dysfunction/  moderate LAE/  trivial PR/  mild TR, PASP 50-11mmHg  . TRANSURETHRAL RESECTION OF BLADDER TUMOR N/A 01/31/2016   Procedure: TRANSURETHRAL RESECTION OF BLADDER TUMOR (TURBT);  Surgeon: [name], MD;  Location: [facility];  Service: Urology;  Laterality: N/A;    History reviewed. No pertinent family history. Social History:  reports that he quit smoking about 58 years ago. He has never used smokeless tobacco. He reports that he drinks alcohol. He reports that he does not use drugs.  Allergies:  Allergies  Allergen Reactions  . Aspirin Other (See Comments)    Stomach pain   . Benadryl [Diphenhydramine Hcl (Sleep)] Hives  . Chlorhexidine Other (See Comments)    CAUSED SKIN IRRITATION AND BURNING SENSATION    Medications Prior to Admission  Medication Sig Dispense Refill  . acetaminophen (TYLENOL) 500 MG tablet Take 1,000 mg by mouth every 6 (six) hours as needed.    Marland Kitchen amLODipine (NORVASC) 5 MG tablet Take 5 mg by mouth every morning.    Marland Kitchen CYANOCOBALAMIN IJ Inject as directed every 30 (thirty) days.    . furosemide (LASIX) 40 MG tablet Take 80 mg by mouth every morning.     Marland Kitchen omeprazole (PRILOSEC) 20 MG capsule Take 20 mg by mouth every morning.    . traMADol (ULTRAM) 50 MG tablet Take 1-2 tablets (50-100 mg total) by mouth every 6 (six) hours as needed. 15 tablet 0  . phenazopyridine (PYRIDIUM) 200 MG tablet Take 1 tablet (200 mg total) by mouth 3 (three) times daily as needed for pain. 10 tablet 0    Results for orders placed or performed during the hospital encounter of 06/02/16 (from the past 48 hour(s))  I-STAT, chem 8     Status: Abnormal   Collection Time: 06/02/16 11:36 AM  Result Value Ref Range   Sodium 137 135 - 145 mmol/L   Potassium 3.7 3.5 - 5.1 mmol/L   Chloride 96 (L) 101 - 111 mmol/L   BUN 35 (H) 6 - 20 mg/dL   Creatinine, Ser  1.80 (H) 0.61 - 1.24 mg/dL   Glucose, Bld 116 (H) 65 - 99 mg/dL   Calcium, Ion 1.15 1.15 - 1.40 mmol/L   TCO2 31 0 - 100 mmol/L   Hemoglobin 13.9 13.0 - 17.0 g/dL   HCT 41.0 39.0 - 52.0 %    No results found.   Pertinent items are noted in HPI.  Blood pressure (!) 151/107, pulse 63, temperature 97.8 F (36.6 C), temperature source Oral, resp. rate 18, height 5\' 3"  (1.6 m), weight 67 kg (147 lb 12.8 oz), SpO2 97 %.  General appearance: alert, cooperative and appears stated age Head: Normocephalic, without obvious abnormality Neck: no JVD Resp: clear to auscultation bilaterally Cardio: regular rate and rhythm, S1, S2 normal, no murmur, click, rub or gallop GI: soft, non-tender; bowel sounds  normal; no masses,  no organomegaly Extremities: swelling erythema PIP LSF Pulses: 2+ and symmetric Skin: Skin color, texture, turgor normal. No rashes or lesions Neurologic: Grossly normal Incision/Wound: na  Assessment/Plan Assessment:  1. Acute idiopathic gout of left hand possible infection   Plan: We will go ahead and schedule him for I and I&D of this today. This will be scheduled for an outpatient under regional local anesthesia. Discussed this with him. He is aware that this will be left open. We will culture this.       Demontay Grantham R 06/02/2016, 12:04 PM

## 2016-06-02 NOTE — Anesthesia Postprocedure Evaluation (Signed)
Anesthesia Post Note  Patient: Angel Cooper  Procedure(s) Performed: Procedure(s) (LRB): INCISION AND DRAINAGE LEFT SMALL FINGER (Left)  Patient location during evaluation: PACU Anesthesia Type: MAC and Bier Block Level of consciousness: awake and alert Pain management: pain level controlled Vital Signs Assessment: post-procedure vital signs reviewed and stable Respiratory status: spontaneous breathing, nonlabored ventilation, respiratory function stable and patient connected to nasal cannula oxygen Cardiovascular status: stable and blood pressure returned to baseline Anesthetic complications: no       Last Vitals:  Vitals:   06/02/16 1324 06/02/16 1415  BP:  (!) 163/68  Pulse: 75 69  Resp: (!) 22 18  Temp:  36.6 C    Last Pain:  Vitals:   06/02/16 1400  TempSrc:   PainSc: 0-No pain                 [name] S

## 2016-06-02 NOTE — Op Note (Signed)
Angel Cooper, Angel Cooper             ACCOUNT NO.:  [id number]  MEDICAL RECORD NO.:  [id number]  LOCATION:                                 FACILITY:  PHYSICIAN:  [name], M.D.            DATE OF BIRTH:  DATE OF PROCEDURE:  06/02/2016 DATE OF DISCHARGE:                              OPERATIVE REPORT   PREOPERATIVE DIAGNOSIS:  Gout with questionable infection proximal interphalangeal joint, left small finger.  POSTOPERATIVE DIAGNOSIS:  Gout with questionable infection proximal interphalangeal joint, left small finger.  OPERATION:  Incision and drainage, PIP joint, left small finger.  SURGEON:  [name], MD.  ASSISTANT:  None.  ANESTHESIA:  Forearm-based IV regional with metacarpal block.  PLACE OF SURGERY:  [facility].  ANESTHESIOLOGIST:  __________.  HISTORY:  The patient is an 83 year old male with a history of gout and a swollen PIP joint, left small finger.  He has been on prednisone without relief.  He has had blood work revealing a uric acid of 12.1, a white count of 14, and an elevated sed rate.  He has not seen response to the prednisone.  He is admitted for incision and drainage, to be certain there is not an infectious process with a minor defect in the proximal phalanx bone.  In the preoperative area, the patient was seen, the extremity marked by both patient and surgeon.  PROCEDURE IN DETAIL:  The patient was brought to the operating room, where a forearm-based IV regional anesthetic was carried out without difficulty.  He was prepped using DuraPrep in a supine position with the left arm free.  A 3-minute dry time was allowed, and a time-out taken, confirming the patient and procedure.  A metacarpal block was given with 0.25% bupivacaine without epinephrine prior to the incision.  After adequate anesthesia was afforded, a curvilinear incision was made over the PIP joint of the left small finger.  This was carried down through skin and immediately a  large amount of very liquid white toothpaste extruded.  Cultures were taken for both aerobic and anaerobic culture. The skin incision was completed and the joint was immediately visible with the opening between the extensor tendon and lateral bands.  The joint was then cleaned with a curette, irrigated.  A partial synovectomy performed.  Obvious arthritic changes were present to the joint.  The wound was copiously irrigated with saline.  A doubled over Penrose drain was placed into the depths of the wound.  The skin proximally and distally was closed with interrupted 4-0 nylon sutures.  A sterile compressive dressing, splint to the finger was applied.  On deflation of the tourniquet, all fingers immediately pinked.  He was taken to the recovery room for observation in satisfactory condition.  He will be discharged to home to return to the [place] in 1 week, on So-Hi and Septra DS.  He was given Keflex in the operating room after the cultures were taken.  He will return to the office in 3 days.          ______________________________ [name], M.D.     GK/MEDQ  D:  06/02/2016  T:  06/02/2016  Job:  [id number]

## 2016-06-02 NOTE — Anesthesia Preprocedure Evaluation (Signed)
Anesthesia Evaluation  Patient identified by MRN, date of birth, ID band Patient awake    Reviewed: Allergy & Precautions, H&P , NPO status , Patient's Chart, lab work & pertinent test results  Airway Mallampati: II   Neck ROM: full    Dental   Pulmonary former smoker,    breath sounds clear to auscultation       Cardiovascular hypertension, + CAD, + CABG and +CHF  + dysrhythmias Atrial Fibrillation  Rhythm:regular Rate:Normal     Neuro/Psych  Neuromuscular disease    GI/Hepatic hiatal hernia, GERD  ,  Endo/Other    Renal/GU Renal InsufficiencyRenal diseases/p nephrectomy for CA     Musculoskeletal  (+) Arthritis ,   Abdominal   Peds  Hematology   Anesthesia Other Findings   Reproductive/Obstetrics                             Anesthesia Physical Anesthesia Plan  ASA: III  Anesthesia Plan: MAC and Bier Block   Post-op Pain Management:    Induction: Intravenous  Airway Management Planned: Simple Face Mask  Additional Equipment:   Intra-op Plan:   Post-operative Plan:   Informed Consent: I have reviewed the patients History and Physical, chart, labs and discussed the procedure including the risks, benefits and alternatives for the proposed anesthesia with the patient or authorized representative who has indicated his/her understanding and acceptance.     Plan Discussed with: CRNA, Anesthesiologist and Surgeon  Anesthesia Plan Comments:         Anesthesia Quick Evaluation

## 2016-06-02 NOTE — Brief Op Note (Signed)
06/02/2016  12:51 PM  PATIENT:  Angel Cooper  83 y.o. male  PRE-OPERATIVE DIAGNOSIS:  Left small finger infected   POST-OPERATIVE DIAGNOSIS:  * No post-op diagnosis entered *  PROCEDURE:  Procedure(s): INCISION AND DRAINAGE ABSCESS (Left)  SURGEON:  Surgeon(s) and Role:    [name], MD - Primary  PHYSICIAN ASSISTANT:   ASSISTANTS: none   ANESTHESIA:   local and regional  EBL:  Total I/O In: 600 [I.V.:600] Out: 0.5 [Blood:0.5]  BLOOD ADMINISTERED:none  DRAINS: Penrose drain in the left small finger   LOCAL MEDICATIONS USED:  BUPIVICAINE   SPECIMEN:  Source of Specimen:  cultures  DISPOSITION OF SPECIMEN:  PATHOLOGY  COUNTS:  YES  TOURNIQUET:  * Missing tourniquet times found for documented tourniquets in log:  [id number] *  DICTATION: .Other Dictation: Dictation Number [id number]  PLAN OF CARE: Discharge to home after PACU  PATIENT DISPOSITION:  PACU - hemodynamically stable.

## 2016-06-02 NOTE — Anesthesia Procedure Notes (Signed)
Anesthesia Regional Block: Bier block (IV Regional)   Pre-Anesthetic Checklist: ,, timeout performed, Correct Patient, Correct Site, Correct Laterality, Correct Procedure,, site marked, surgical consent,, at surgeon's request  Laterality: Left     Needles:  Injection technique: Single-shot  Needle Type: Other      Needle Gauge: 22     Additional Needles:   Procedures:,,,,,,, Esmarch exsanguination, single tourniquet utilized,  Narrative:   Performed by: Personally       

## 2016-06-02 NOTE — Transfer of Care (Signed)
Immediate Anesthesia Transfer of Care Note  Patient: Angel Cooper  Procedure(s) Performed: Procedure(s): INCISION AND DRAINAGE LEFT SMALL FINGER (Left)  Patient Location: PACU  Anesthesia Type:MAC and Bier block  Level of Consciousness: awake, alert , oriented and patient cooperative  Airway & Oxygen Therapy: Patient Spontanous Breathing, RA O2 99%  Post-op Assessment: Report given to RN and Post -op Vital signs reviewed and stable  Post vital signs: Reviewed and stable  Last Vitals:  Vitals:   06/02/16 1118  BP: (!) 151/107  Pulse: 63  Resp: 18  Temp: 36.6 C    Last Pain:  Vitals:   06/02/16 1118  TempSrc: Oral      Patients Stated Pain Goal: 0 (78/93/81 0175)  Complications: Patient re-intubated

## 2016-06-04 ENCOUNTER — Encounter (HOSPITAL_BASED_OUTPATIENT_CLINIC_OR_DEPARTMENT_OTHER): Payer: Self-pay | Admitting: Orthopedic Surgery

## 2016-06-07 LAB — AEROBIC/ANAEROBIC CULTURE (SURGICAL/DEEP WOUND)

## 2016-06-07 LAB — AEROBIC/ANAEROBIC CULTURE W GRAM STAIN (SURGICAL/DEEP WOUND): Culture: NO GROWTH

## 2016-06-30 NOTE — Addendum Note (Signed)
Addendum  created 06/30/16 0948 by Myrtie Soman, MD   Sign clinical note

## 2016-09-26 ENCOUNTER — Emergency Department (HOSPITAL_BASED_OUTPATIENT_CLINIC_OR_DEPARTMENT_OTHER)
Admit: 2016-09-26 | Discharge: 2016-09-26 | Disposition: A | Discharge status: Home / Self Care | Payer: Medicare Other | Attending: Emergency Medicine | Admitting: Emergency Medicine

## 2016-09-26 ENCOUNTER — Emergency Department (HOSPITAL_COMMUNITY): Payer: Medicare Other

## 2016-09-26 ENCOUNTER — Emergency Department (HOSPITAL_COMMUNITY)
Admission: EM | Admit: 2016-09-26 | Discharge: 2016-09-26 | Disposition: A | Discharge status: Home / Self Care | Payer: Medicare Other | Attending: Emergency Medicine | Admitting: Emergency Medicine

## 2016-09-26 ENCOUNTER — Encounter (HOSPITAL_COMMUNITY): Payer: Self-pay

## 2016-09-26 DIAGNOSIS — M7989 Other specified soft tissue disorders: Secondary | ICD-10-CM

## 2016-09-26 DIAGNOSIS — I130 Hypertensive heart and chronic kidney disease with heart failure and stage 1 through stage 4 chronic kidney disease, or unspecified chronic kidney disease: Secondary | ICD-10-CM | POA: Diagnosis not present

## 2016-09-26 DIAGNOSIS — M109 Gout, unspecified: Secondary | ICD-10-CM | POA: Insufficient documentation

## 2016-09-26 DIAGNOSIS — M25562 Pain in left knee: Secondary | ICD-10-CM | POA: Diagnosis present

## 2016-09-26 DIAGNOSIS — Z87891 Personal history of nicotine dependence: Secondary | ICD-10-CM | POA: Insufficient documentation

## 2016-09-26 DIAGNOSIS — I2510 Atherosclerotic heart disease of native coronary artery without angina pectoris: Secondary | ICD-10-CM | POA: Diagnosis not present

## 2016-09-26 DIAGNOSIS — I252 Old myocardial infarction: Secondary | ICD-10-CM | POA: Diagnosis not present

## 2016-09-26 DIAGNOSIS — I509 Heart failure, unspecified: Secondary | ICD-10-CM | POA: Diagnosis not present

## 2016-09-26 DIAGNOSIS — Z951 Presence of aortocoronary bypass graft: Secondary | ICD-10-CM | POA: Diagnosis not present

## 2016-09-26 DIAGNOSIS — Z8554 Personal history of malignant neoplasm of ureter: Secondary | ICD-10-CM | POA: Diagnosis not present

## 2016-09-26 DIAGNOSIS — N183 Chronic kidney disease, stage 3 (moderate): Secondary | ICD-10-CM | POA: Insufficient documentation

## 2016-09-26 DIAGNOSIS — Z79899 Other long term (current) drug therapy: Secondary | ICD-10-CM | POA: Insufficient documentation

## 2016-09-26 LAB — SYNOVIAL CELL COUNT + DIFF, W/ CRYSTALS
Eosinophils-Synovial: 0 % (ref 0–1)
LYMPHOCYTES-SYNOVIAL FLD: 1 % (ref 0–20)
MONOCYTE-MACROPHAGE-SYNOVIAL FLUID: 16 % — AB (ref 50–90)
NEUTROPHIL, SYNOVIAL: 83 % — AB (ref 0–25)
Other Cells-SYN: 0
WBC, SYNOVIAL: 14400 /mm3 — AB (ref 0–200)

## 2016-09-26 LAB — COMPREHENSIVE METABOLIC PANEL
ALT: 6 U/L — ABNORMAL LOW (ref 17–63)
AST: 17 U/L (ref 15–41)
Albumin: 4 g/dL (ref 3.5–5.0)
Alkaline Phosphatase: 71 U/L (ref 38–126)
Anion gap: 11 (ref 5–15)
BILIRUBIN TOTAL: 0.8 mg/dL (ref 0.3–1.2)
BUN: 24 mg/dL — AB (ref 6–20)
CHLORIDE: 101 mmol/L (ref 101–111)
CO2: 24 mmol/L (ref 22–32)
Calcium: 9.1 mg/dL (ref 8.9–10.3)
Creatinine, Ser: 1.67 mg/dL — ABNORMAL HIGH (ref 0.61–1.24)
GFR, EST AFRICAN AMERICAN: 42 mL/min — AB (ref >60)
GFR, EST NON AFRICAN AMERICAN: 36 mL/min — AB (ref >60)
Glucose, Bld: 99 mg/dL (ref 65–99)
POTASSIUM: 3.8 mmol/L (ref 3.5–5.1)
Sodium: 136 mmol/L (ref 135–145)
TOTAL PROTEIN: 7.1 g/dL (ref 6.5–8.1)

## 2016-09-26 LAB — I-STAT CG4 LACTIC ACID, ED
LACTIC ACID, VENOUS: 1.27 mmol/L (ref 0.5–1.9)
LACTIC ACID, VENOUS: 1.09 mmol/L (ref 0.5–1.9)

## 2016-09-26 LAB — CBC WITH DIFFERENTIAL/PLATELET
Basophils Absolute: 0 10*3/uL (ref 0.0–0.1)
Basophils Relative: 0 %
EOS PCT: 0 %
Eosinophils Absolute: 0.1 10*3/uL (ref 0.0–0.7)
HEMATOCRIT: 38.6 % — AB (ref 39.0–52.0)
Hemoglobin: 12.8 g/dL — ABNORMAL LOW (ref 13.0–17.0)
LYMPHS ABS: 1.1 10*3/uL (ref 0.7–4.0)
LYMPHS PCT: 8 %
MCH: 29.9 pg (ref 26.0–34.0)
MCHC: 33.2 g/dL (ref 30.0–36.0)
MCV: 90.2 fL (ref 78.0–100.0)
MONO ABS: 1.1 10*3/uL — AB (ref 0.1–1.0)
MONOS PCT: 8 %
NEUTROS ABS: 11.2 10*3/uL — AB (ref 1.7–7.7)
Neutrophils Relative %: 84 %
PLATELETS: 290 10*3/uL (ref 150–400)
RBC: 4.28 MIL/uL (ref 4.22–5.81)
RDW: 13.7 % (ref 11.5–15.5)
WBC: 13.4 10*3/uL — ABNORMAL HIGH (ref 4.0–10.5)

## 2016-09-26 LAB — CBG MONITORING, ED: Glucose-Capillary: 97 mg/dL (ref 65–99)

## 2016-09-26 LAB — PROTIME-INR
INR: 1.01
PROTHROMBIN TIME: 13.2 s (ref 11.4–15.2)

## 2016-09-26 MED ORDER — PREDNISONE 20 MG PO TABS: 40.0000 mg | ORAL_TABLET | Freq: Every day | ORAL | 0 refills | Status: DC

## 2016-09-26 MED ORDER — HYDROMORPHONE HCL 1 MG/ML IJ SOLN
0.5000 mg | Freq: Once | INTRAMUSCULAR | Status: AC
  Administered 2016-09-26: 0.5 mg via INTRAVENOUS
  Filled 2016-09-26: qty 1

## 2016-09-26 MED ORDER — OXYCODONE-ACETAMINOPHEN 5-325 MG PO TABS: 1.0000 | ORAL_TABLET | Freq: Four times a day (QID) | ORAL | 0 refills | Status: DC | PRN

## 2016-09-26 MED ORDER — FENTANYL CITRATE (PF) 100 MCG/2ML IJ SOLN: 50.0000 ug | Freq: Once | INTRAMUSCULAR | Status: DC

## 2016-09-26 MED ORDER — PREDNISONE 20 MG PO TABS
60.0000 mg | ORAL_TABLET | Freq: Once | ORAL | Status: AC
  Administered 2016-09-26: 60 mg via ORAL
  Filled 2016-09-26: qty 3

## 2016-09-26 MED ORDER — FENTANYL CITRATE (PF) 100 MCG/2ML IJ SOLN
50.0000 ug | Freq: Once | INTRAMUSCULAR | Status: AC
  Administered 2016-09-26: 50 ug via INTRAVENOUS
  Filled 2016-09-26: qty 2

## 2016-09-26 MED ORDER — LIDOCAINE-EPINEPHRINE (PF) 2 %-1:200000 IJ SOLN
10.0000 mL | Freq: Once | INTRAMUSCULAR | Status: AC
  Administered 2016-09-26: 10 mL via INTRADERMAL
  Filled 2016-09-26: qty 20

## 2016-09-26 MED ORDER — MORPHINE SULFATE (PF) 4 MG/ML IV SOLN
4.0000 mg | Freq: Once | INTRAVENOUS | Status: AC
  Administered 2016-09-26: 4 mg via INTRAVENOUS
  Filled 2016-09-26: qty 1

## 2016-09-26 NOTE — ED Triage Notes (Addendum)
Pt presents to the ed with complaints of left leg pain since Tuesday, denies injury. States that nothing helps his pain and has been continuously getting worse. Swelling is present, cms intact. Denies any other symptoms.

## 2016-09-26 NOTE — ED Notes (Signed)
Patient presents to ed c/o pain and swelling to left knee, states his knee started hurting on tues worse on Wed. C/o increased pain with walking normally walks with a cane. Left knee is swollen  Red and  warm to touch . States he has had gout in the past.

## 2016-09-26 NOTE — ED Provider Notes (Signed)
[facility] DEPT Provider Note   CSN: [id number] Arrival date & time: 09/26/16  2831     History   Chief Complaint Chief Complaint  Patient presents with  . Leg Pain    HPI Angel Cooper is a 83 y.o. male.  The history is provided by the patient and a relative.  Knee Pain   This is a new problem. The current episode started more than 2 days ago. The problem occurs constantly. The problem has been gradually worsening. The pain is present in the left knee. The pain is at a severity of 9/10. The pain is severe. Associated symptoms include limited range of motion and itching. Pertinent negatives include no numbness. He has tried nothing for the symptoms. The treatment provided no relief. There has been no history of extremity trauma. Family history is significant for gout (posible).    Past Medical History:  Diagnosis Date  . Atrial fibrillation ([place])    2015  W/ RVR  . Bladder tumor   . CHF (congestive heart failure) ([place]) 2015  . CKD (chronic kidney disease), stage III   . Coronary artery disease    NO LONGER SEES CARDIOLOGIST - LAST SAW DR. [name] 2 OR MORE YRS AGO-PT SEES PCP DR [name]  . GERD (gastroesophageal reflux disease)   . Hiatal hernia   . History of Angel polyps   . History of gastric ulcer    1989  peptic and duodenal  . History of MI (myocardial infarction)    1995  . History of non-ST elevation myocardial infarction (NSTEMI)    07-05-1999  s/p cabg  . Hyperlipidemia   . Hypertension   . OA (osteoarthritis)   . S/P CABG (coronary artery bypass graft)    06/ 2001  . Ureteral carcinoma, right [facility]) urologist-  dr herrick/  oncologist- dr [name]   dx 2014  s/p  right nephroureterectomy 01-24-2013 (T3, N0) invasive high grade urothelial carcinoma--     Patient Active Problem List   Diagnosis Date Noted  . Atrial fibrillation with RVR ([place]) 01/28/2013  . CAD (coronary artery disease) of artery bypass graft 01/28/2013  . CKD (chronic kidney  disease) stage 3, GFR 30-59 ml/min 01/28/2013  . Ureteral cancer ([place]) 01/24/2013    Past Surgical History:  Procedure Laterality Date  . CARDIAC CATHETERIZATION  07/05/1999   non-q MI;  3vessel CAD w/ high grade pLAD and mLAD, subtotal ramus branch,  OM1 70-80%,  ostialCFX 50-60%,  dCFx 80%,  RI 99%,  total occluded pRCA and fills last by right-to-right collaterals,  pLM  20% narrowing;  severe inferoapical hypokinesis  . CATARACT EXTRACTION W/ INTRAOCULAR LENS  IMPLANT, BILATERAL    . CORONARY ARTERY BYPASS GRAFT  06/ 2001  dr [name] tright  . CYSTOSCOPY W/ RETROGRADES Left 01/31/2016   Procedure: CYSTOSCOPY WITH RETROGRADE PYELOGRAM;  Surgeon: [name], MD;  Location: [facility];  Service: Urology;  Laterality: Left;  . CYSTOSCOPY WITH RETROGRADE PYELOGRAM, URETEROSCOPY AND STENT PLACEMENT Right 12/03/2012   Procedure: CYSTOSCOPY WITH RIGHT RETROGRADE PYELOGRAM,RIGHT URETERAL WASHINGS, RIGHT URETERAL BIOPSY, RIGHT URETEROSCOPY AND STENT PLACEMENT;  Surgeon: [name], MD;  Location: [facility];  Service: Urology;  Laterality: Right;  . CYSTOSCOPY WITH RETROGRADE PYELOGRAM, URETEROSCOPY AND STENT PLACEMENT Right 12/15/2012   Procedure: CYSTOSCOPY WITH RIGHT RETROGRADE PYELOGRAM, POSSIBLE RIGHT URETEROSCOPY AND STENT PLACEMENT AND REMOVAL OF LEFT STENT;  Surgeon: [name], MD;  Location: [facility];  Service: Urology;  Laterality: Right;  .  CYSTOSCOPY WITH STENT PLACEMENT Right 01/24/2013   Procedure: CYSTOSCOPY WITH STENT PLACEMENT;  Surgeon: [name], MD;  Location: [facility];  Service: Urology;  Laterality: Right;  . INCISION AND DRAINAGE ABSCESS Left 06/02/2016   Procedure: INCISION AND DRAINAGE LEFT SMALL FINGER;  Surgeon: [name], MD;  Location: [facility];  Service: Orthopedics;  Laterality: Left;  . ROBOT ASSITED LAPAROSCOPIC NEPHROURETERECTOMY Right 01/24/2013   Procedure: ROBOT ASSISTED LAPAROSCOPIC NEPHROURETERECTOMY WITH  BILATERAL LYMPH NODE DISSECTION ;  Surgeon: [name], MD;  Location: [facility];  Service: Urology;  Laterality: Right;  right kidney  . TRANSTHORACIC ECHOCARDIOGRAM  01/28/2013   mild LVH,  ef 45-50%, diffuse hypokinesis,  grade 1 diastolic dysfunction/  moderate LAE/  trivial PR/  mild TR, PASP 50-32mmHg  . TRANSURETHRAL RESECTION OF BLADDER TUMOR N/A 01/31/2016   Procedure: TRANSURETHRAL RESECTION OF BLADDER TUMOR (TURBT);  Surgeon: [name], MD;  Location: [facility];  Service: Urology;  Laterality: N/A;       Home Medications    Prior to Admission medications   Medication Sig Start Date End Date Taking? Authorizing Provider  acetaminophen (TYLENOL) 500 MG tablet Take 1,000 mg by mouth every 6 (six) hours as needed.    [provider]  amLODipine (NORVASC) 5 MG tablet Take 5 mg by mouth every morning.    [provider]  CYANOCOBALAMIN IJ Inject as directed every 30 (thirty) days.    [provider]  furosemide (LASIX) 40 MG tablet Take 80 mg by mouth every morning.     [provider]  omeprazole (PRILOSEC) 20 MG capsule Take 20 mg by mouth every morning.    [provider]  oxyCODONE-acetaminophen (ROXICET) 5-325 MG tablet Take 1 tablet by mouth every 4 (four) hours as needed. 06/02/16   [name], MD  phenazopyridine (PYRIDIUM) 200 MG tablet Take 1 tablet (200 mg total) by mouth 3 (three) times daily as needed for pain. 01/31/16   [name], MD  sulfamethoxazole-trimethoprim (BACTRIM DS,SEPTRA DS) 800-160 MG tablet Take 1 tablet by mouth 2 (two) times daily. 06/02/16   [name], MD  traMADol (ULTRAM) 50 MG tablet Take 1-2 tablets (50-100 mg total) by mouth every 6 (six) hours as needed. 01/31/16   [name], MD    Family History No family history on file.  Social History Social History  Substance Use Topics  . Smoking status: Former Smoker    Quit date: 01/27/1958  . Smokeless tobacco:  Never Used  . Alcohol use Yes     Comment: OCCASIONAL     Allergies   Aspirin; Benadryl [diphenhydramine hcl (sleep)]; and Chlorhexidine   Review of Systems Review of Systems  Constitutional: Negative for chills, diaphoresis, fatigue and fever.  HENT: Negative for congestion.   Eyes: Negative for photophobia and visual disturbance.  Respiratory: Negative for cough, chest tightness, shortness of breath and wheezing.   Cardiovascular: Positive for leg swelling. Negative for chest pain and palpitations.  Gastrointestinal: Positive for diarrhea. Negative for constipation, nausea and vomiting.  Genitourinary: Negative for dysuria and flank pain.  Musculoskeletal: Positive for joint swelling. Negative for back pain, neck pain and neck stiffness.  Skin: Positive for itching and rash. Negative for wound.  Neurological: Negative for weakness, light-headedness, numbness and headaches.  Psychiatric/Behavioral: Negative for agitation.  All other systems reviewed and are negative.    Physical Exam Updated Vital Signs BP (!) 142/59   Pulse 85   Temp 98.2  F (36.8 C)   Resp 18   Wt 68 kg (150 lb)   SpO2 97%   BMI 26.57 kg/m   Physical Exam  Constitutional: He is oriented to person, place, and time. He appears well-developed and well-nourished. No distress.  HENT:  Head: Normocephalic and atraumatic.  Right Ear: External ear normal.  Left Ear: External ear normal.  Nose: Nose normal.  Mouth/Throat: Oropharynx is clear and moist. No oropharyngeal exudate.  Eyes: Pupils are equal, round, and reactive to light. Conjunctivae and EOM are normal.  Neck: Normal range of motion. Neck supple.  Cardiovascular: Normal rate and intact distal pulses.   No murmur heard. Pulmonary/Chest: Breath sounds normal. No stridor. No respiratory distress. He exhibits no tenderness.  Abdominal: Soft. There is no tenderness. There is no rebound and no guarding.  Musculoskeletal: He exhibits edema and  tenderness.       Left knee: He exhibits decreased range of motion, swelling, effusion and erythema. He exhibits no ecchymosis and no laceration. Tenderness found.       Legs: Normal pulse, sensation, and strength of left foot. Tenderness and swelling of knee. Subjective swelling of left lower leg. Left knee warm compared to right.  Neurological: He is alert and oriented to person, place, and time. He displays normal reflexes. No cranial nerve deficit or sensory deficit. He exhibits normal muscle tone. Coordination normal.  Skin: Skin is warm. Capillary refill takes less than 2 seconds. Rash noted. He is not diaphoretic. There is erythema. No pallor.  Psychiatric: He has a normal mood and affect.  Nursing note and vitals reviewed.    ED Treatments / Results  Labs (all labs ordered are listed, but only abnormal results are displayed) Labs Reviewed  CBC WITH DIFFERENTIAL/PLATELET - Abnormal; Notable for the following:       Result Value   WBC 13.4 (*)    Hemoglobin 12.8 (*)    HCT 38.6 (*)    Neutro Abs 11.2 (*)    Monocytes Absolute 1.1 (*)    All other components within normal limits  COMPREHENSIVE METABOLIC PANEL - Abnormal; Notable for the following:    BUN 24 (*)    Creatinine, Ser 1.67 (*)    ALT 6 (*)    GFR calc non Af Amer 36 (*)    GFR calc Af Amer 42 (*)    All other components within normal limits  SYNOVIAL CELL COUNT + DIFF, W/ CRYSTALS - Abnormal; Notable for the following:    Color, Synovial STRAW (*)    Appearance-Synovial TURBID (*)    WBC, Synovial 14,400 (*)    Neutrophil, Synovial 83 (*)    Monocyte-Macrophage-Synovial Fluid 16 (*)    All other components within normal limits  BODY FLUID CULTURE  PROTIME-INR  GLUCOSE, SYNOVIAL FLUID  GLUCOSE, BODY FLUID OTHER  I-STAT CG4 LACTIC ACID, ED  I-STAT CG4 LACTIC ACID, ED  CBG MONITORING, ED    EKG  EKG Interpretation None       Radiology Dg Knee Complete 4 Views Left  Result Date:  09/26/2016 CLINICAL DATA:  Left knee swelling and pain. EXAM: LEFT KNEE - COMPLETE 4+ VIEW COMPARISON:  No recent prior . FINDINGS: Severe tricompartment degenerative change. No evidence of fracture or dislocation. Small knee joint effusion is present. IMPRESSION: 1. Severe tricompartment degenerative change. No acute bony abnormality identified. Small knee joint effusion. 2. Peripheral vascular disease. Electronically Signed   By: Marcello Moores  Register   On: 09/26/2016 13:20    Procedures  Procedures (including critical care time)  Medications Ordered in ED Medications  lidocaine-EPINEPHrine (XYLOCAINE W/EPI) 2 %-1:200000 (PF) injection 10 mL (not administered)  HYDROmorphone (DILAUDID) injection 0.5 mg (not administered)  predniSONE (DELTASONE) tablet 60 mg (not administered)  fentaNYL (SUBLIMAZE) injection 50 mcg (50 mcg Intravenous Given 09/26/16 1325)  morphine 4 MG/ML injection 4 mg (4 mg Intravenous Given 09/26/16 1558)     Initial Impression / Assessment and Plan / ED Course  I have reviewed the triage vital signs and the nursing notes.  Pertinent labs & imaging results that were available during my care of the patient were reviewed by me and considered in my medical decision making (see chart for details).     Angel Cooper is a 83 y.o. male  with a past medical history significant for CAD with MI and CABG, CK D, CHF, hypertension, hyperlipidemia, GERD, CK D, bladder cancer status post resection, and possible gout who presents with left leg pain, left leg swelling, left knee pain. Patient says that since Tuesday, 4 days ago, he has been having left leg symptoms. He reports that he began having pain in his left knee and left lower leg. He reports he noticed some redness over the last few days. He has had some itching and scratch the area. He says he has never had a history of this. He was told at one point he may have gout but he is uncertain. The describes the pain as moderate to severe.  He denies any traumatic injuries. He says that he has had some recent diarrhea but otherwise denies any fevers, chills, or urinary symptoms. He describes the left leg pain as a 9 out of 10 severity. He denies any history of DVT or PE. He has history of arthroscopic surgery but no report of hardware in either knee. Unsure of history of gout.  On exam, patient has warmth in the left leg. There is erythema in the calf area and proximal tibia area. Patient has knee swelling and pain with palpation and manipulation of the knee joint. Patient has no tenderness in the hips. Lungs clear and abdomen nontender. Patient has normal sensation and pulses in distal lower extremity. Normal strength in the ankle and toes.  Given history of cancer in the leg swelling and pain, patient will have ultrasound to rule out DVT. He will have laboratory testing to look for infection given the concern for possible cellulitis with the posterior redness. Patient will have x-ray to look for fracture given the pain. Will speak with patient and consider the aspiration after workup is completed.      Patient's laboratory testing showed improved hemoglobin from prior. Similar leukocytosis. Normal lactic acid. INR normal. Metabolic panel showed slightly improved creatinine from prior.  Knee x-ray shows degenerative disease and small effusion.  On reassessment, patient continues to have warmth and redness of the left knee. Patient continues to have pain with knee manipulation. Patient will need knee aspiration to rule out septic joint.  Patient will have the aspiration labs sent, anticipate reassessment following aspiration.  Care transferred to Dr. Alvino Chapel while awaiting results of knee aspiration. Anticipate discharge if no abnormalities are seen with possible treatment for cellulitis. If infection seen in the joint, orthopedics will likely need to be called. Suspect patient would be safe for discharge home if gout is  discovered.  Care transferred in stable condition.   Final Clinical Impressions(s) / ED Diagnoses   Final diagnoses:  Acute gout of left knee, unspecified cause  Clinical Impression: 1. Acute gout of left knee, unspecified cause     Disposition: care transferred to Dr. Lilian Kapur, Gwenyth Allegra, MD 09/26/16 2026

## 2016-09-26 NOTE — ED Notes (Signed)
Transported to vascular. 

## 2016-09-26 NOTE — ED Notes (Signed)
Family at bedside. 

## 2016-09-26 NOTE — Progress Notes (Signed)
*  PRELIMINARY RESULTS* Vascular Ultrasound Left lower extremity venous duplex has been completed.  Preliminary findings: Technically limited due to immobility of leg. No evidence of DVT or baker's cyst.   Landry Mellow, RDMS, RVT  09/26/2016, 4:28 PM

## 2016-09-26 NOTE — ED Provider Notes (Signed)
  Physical Exam  BP 135/76   Pulse 87   Temp 98.2 F (36.8 C)   Resp 17   Wt 68 kg (150 lb)   SpO2 97%   BMI 26.57 kg/m   Physical Exam  ED Course  Procedures  MDM Synovial fluid analysis appears to show gout. Does not have infection. Will give prednisone and pain medicines. Follow-up with PCP.       Davonna Belling, MD 09/26/16 1946

## 2016-09-26 NOTE — ED Notes (Signed)
Pt departed in NAD.  

## 2016-09-27 LAB — GLUCOSE, BODY FLUID OTHER: Glucose, Body Fluid Other: 55 mg/dL

## 2016-09-29 LAB — BODY FLUID CULTURE: CULTURE: NO GROWTH

## 2017-02-05 DIAGNOSIS — K529 Noninfective gastroenteritis and colitis, unspecified: Secondary | ICD-10-CM | POA: Diagnosis not present

## 2017-02-06 DIAGNOSIS — K529 Noninfective gastroenteritis and colitis, unspecified: Secondary | ICD-10-CM | POA: Diagnosis not present

## 2017-03-02 DIAGNOSIS — D510 Vitamin B12 deficiency anemia due to intrinsic factor deficiency: Secondary | ICD-10-CM | POA: Diagnosis not present

## 2017-04-02 DIAGNOSIS — D510 Vitamin B12 deficiency anemia due to intrinsic factor deficiency: Secondary | ICD-10-CM | POA: Diagnosis not present

## 2017-04-10 DIAGNOSIS — L570 Actinic keratosis: Secondary | ICD-10-CM | POA: Diagnosis not present

## 2017-04-10 DIAGNOSIS — X32XXXD Exposure to sunlight, subsequent encounter: Secondary | ICD-10-CM | POA: Diagnosis not present

## 2017-04-10 DIAGNOSIS — L308 Other specified dermatitis: Secondary | ICD-10-CM | POA: Diagnosis not present

## 2017-04-10 DIAGNOSIS — Z85828 Personal history of other malignant neoplasm of skin: Secondary | ICD-10-CM | POA: Diagnosis not present

## 2017-04-10 DIAGNOSIS — C4441 Basal cell carcinoma of skin of scalp and neck: Secondary | ICD-10-CM | POA: Diagnosis not present

## 2017-04-10 DIAGNOSIS — L814 Other melanin hyperpigmentation: Secondary | ICD-10-CM | POA: Diagnosis not present

## 2017-04-10 DIAGNOSIS — Z08 Encounter for follow-up examination after completed treatment for malignant neoplasm: Secondary | ICD-10-CM | POA: Diagnosis not present

## 2017-04-24 DIAGNOSIS — L570 Actinic keratosis: Secondary | ICD-10-CM | POA: Diagnosis not present

## 2017-04-24 DIAGNOSIS — Z1283 Encounter for screening for malignant neoplasm of skin: Secondary | ICD-10-CM | POA: Diagnosis not present

## 2017-04-24 DIAGNOSIS — X32XXXD Exposure to sunlight, subsequent encounter: Secondary | ICD-10-CM | POA: Diagnosis not present

## 2017-05-04 DIAGNOSIS — D510 Vitamin B12 deficiency anemia due to intrinsic factor deficiency: Secondary | ICD-10-CM | POA: Diagnosis not present

## 2017-06-05 DIAGNOSIS — L308 Other specified dermatitis: Secondary | ICD-10-CM | POA: Diagnosis not present

## 2017-06-05 DIAGNOSIS — Z85828 Personal history of other malignant neoplasm of skin: Secondary | ICD-10-CM | POA: Diagnosis not present

## 2017-06-05 DIAGNOSIS — Z08 Encounter for follow-up examination after completed treatment for malignant neoplasm: Secondary | ICD-10-CM | POA: Diagnosis not present

## 2017-06-13 DIAGNOSIS — H524 Presbyopia: Secondary | ICD-10-CM | POA: Diagnosis not present

## 2017-06-18 DIAGNOSIS — I129 Hypertensive chronic kidney disease with stage 1 through stage 4 chronic kidney disease, or unspecified chronic kidney disease: Secondary | ICD-10-CM | POA: Diagnosis not present

## 2017-06-18 DIAGNOSIS — D510 Vitamin B12 deficiency anemia due to intrinsic factor deficiency: Secondary | ICD-10-CM | POA: Diagnosis not present

## 2017-06-18 DIAGNOSIS — L299 Pruritus, unspecified: Secondary | ICD-10-CM | POA: Diagnosis not present

## 2017-07-20 DIAGNOSIS — D510 Vitamin B12 deficiency anemia due to intrinsic factor deficiency: Secondary | ICD-10-CM | POA: Diagnosis not present

## 2017-08-06 DIAGNOSIS — I129 Hypertensive chronic kidney disease with stage 1 through stage 4 chronic kidney disease, or unspecified chronic kidney disease: Secondary | ICD-10-CM | POA: Diagnosis not present

## 2017-08-06 DIAGNOSIS — N183 Chronic kidney disease, stage 3 (moderate): Secondary | ICD-10-CM | POA: Diagnosis not present

## 2017-08-20 DIAGNOSIS — D510 Vitamin B12 deficiency anemia due to intrinsic factor deficiency: Secondary | ICD-10-CM | POA: Diagnosis not present

## 2017-09-21 DIAGNOSIS — D510 Vitamin B12 deficiency anemia due to intrinsic factor deficiency: Secondary | ICD-10-CM | POA: Diagnosis not present

## 2017-10-27 DIAGNOSIS — D510 Vitamin B12 deficiency anemia due to intrinsic factor deficiency: Secondary | ICD-10-CM | POA: Diagnosis not present

## 2017-10-27 DIAGNOSIS — Z23 Encounter for immunization: Secondary | ICD-10-CM | POA: Diagnosis not present

## 2017-12-01 DIAGNOSIS — D510 Vitamin B12 deficiency anemia due to intrinsic factor deficiency: Secondary | ICD-10-CM | POA: Diagnosis not present

## 2017-12-17 DIAGNOSIS — Z8711 Personal history of peptic ulcer disease: Secondary | ICD-10-CM | POA: Diagnosis not present

## 2017-12-17 DIAGNOSIS — L299 Pruritus, unspecified: Secondary | ICD-10-CM | POA: Diagnosis not present

## 2017-12-17 DIAGNOSIS — Z1389 Encounter for screening for other disorder: Secondary | ICD-10-CM | POA: Diagnosis not present

## 2017-12-17 DIAGNOSIS — E7800 Pure hypercholesterolemia, unspecified: Secondary | ICD-10-CM | POA: Diagnosis not present

## 2017-12-17 DIAGNOSIS — I129 Hypertensive chronic kidney disease with stage 1 through stage 4 chronic kidney disease, or unspecified chronic kidney disease: Secondary | ICD-10-CM | POA: Diagnosis not present

## 2017-12-17 DIAGNOSIS — Z0000 Encounter for general adult medical examination without abnormal findings: Secondary | ICD-10-CM | POA: Diagnosis not present

## 2017-12-17 DIAGNOSIS — K219 Gastro-esophageal reflux disease without esophagitis: Secondary | ICD-10-CM | POA: Diagnosis not present

## 2017-12-17 DIAGNOSIS — I2510 Atherosclerotic heart disease of native coronary artery without angina pectoris: Secondary | ICD-10-CM | POA: Diagnosis not present

## 2017-12-17 DIAGNOSIS — M1A00X0 Idiopathic chronic gout, unspecified site, without tophus (tophi): Secondary | ICD-10-CM | POA: Diagnosis not present

## 2017-12-17 DIAGNOSIS — N183 Chronic kidney disease, stage 3 (moderate): Secondary | ICD-10-CM | POA: Diagnosis not present

## 2017-12-17 DIAGNOSIS — Z789 Other specified health status: Secondary | ICD-10-CM | POA: Diagnosis not present

## 2017-12-17 DIAGNOSIS — F329 Major depressive disorder, single episode, unspecified: Secondary | ICD-10-CM | POA: Diagnosis not present

## 2018-02-04 DIAGNOSIS — I129 Hypertensive chronic kidney disease with stage 1 through stage 4 chronic kidney disease, or unspecified chronic kidney disease: Secondary | ICD-10-CM | POA: Diagnosis not present

## 2018-02-04 DIAGNOSIS — D510 Vitamin B12 deficiency anemia due to intrinsic factor deficiency: Secondary | ICD-10-CM | POA: Diagnosis not present

## 2018-02-04 DIAGNOSIS — R42 Dizziness and giddiness: Secondary | ICD-10-CM | POA: Diagnosis not present

## 2018-03-11 DIAGNOSIS — E538 Deficiency of other specified B group vitamins: Secondary | ICD-10-CM | POA: Diagnosis not present

## 2018-04-17 ENCOUNTER — Emergency Department (HOSPITAL_COMMUNITY): Payer: PPO

## 2018-04-17 ENCOUNTER — Encounter (HOSPITAL_COMMUNITY): Payer: Self-pay | Admitting: Emergency Medicine

## 2018-04-17 ENCOUNTER — Other Ambulatory Visit: Payer: Self-pay

## 2018-04-17 ENCOUNTER — Emergency Department (HOSPITAL_COMMUNITY)
Admission: EM | Admit: 2018-04-17 | Discharge: 2018-04-17 | Disposition: A | Discharge status: Home / Self Care | Payer: PPO | Attending: Emergency Medicine | Admitting: Emergency Medicine

## 2018-04-17 DIAGNOSIS — I509 Heart failure, unspecified: Secondary | ICD-10-CM | POA: Insufficient documentation

## 2018-04-17 DIAGNOSIS — I4891 Unspecified atrial fibrillation: Secondary | ICD-10-CM | POA: Diagnosis not present

## 2018-04-17 DIAGNOSIS — I2510 Atherosclerotic heart disease of native coronary artery without angina pectoris: Secondary | ICD-10-CM | POA: Diagnosis not present

## 2018-04-17 DIAGNOSIS — I252 Old myocardial infarction: Secondary | ICD-10-CM | POA: Insufficient documentation

## 2018-04-17 DIAGNOSIS — Z8554 Personal history of malignant neoplasm of ureter: Secondary | ICD-10-CM | POA: Insufficient documentation

## 2018-04-17 DIAGNOSIS — Z79899 Other long term (current) drug therapy: Secondary | ICD-10-CM | POA: Insufficient documentation

## 2018-04-17 DIAGNOSIS — Z905 Acquired absence of kidney: Secondary | ICD-10-CM | POA: Diagnosis not present

## 2018-04-17 DIAGNOSIS — I130 Hypertensive heart and chronic kidney disease with heart failure and stage 1 through stage 4 chronic kidney disease, or unspecified chronic kidney disease: Secondary | ICD-10-CM | POA: Diagnosis not present

## 2018-04-17 DIAGNOSIS — Z87891 Personal history of nicotine dependence: Secondary | ICD-10-CM | POA: Insufficient documentation

## 2018-04-17 DIAGNOSIS — M545 Low back pain, unspecified: Secondary | ICD-10-CM

## 2018-04-17 DIAGNOSIS — N183 Chronic kidney disease, stage 3 (moderate): Secondary | ICD-10-CM | POA: Insufficient documentation

## 2018-04-17 LAB — BASIC METABOLIC PANEL
ANION GAP: 13 (ref 5–15)
BUN: 26 mg/dL — ABNORMAL HIGH (ref 8–23)
CALCIUM: 9.6 mg/dL (ref 8.9–10.3)
CHLORIDE: 100 mmol/L (ref 98–111)
CO2: 26 mmol/L (ref 22–32)
CREATININE: 1.37 mg/dL — AB (ref 0.61–1.24)
GFR calc Af Amer: 54 mL/min — ABNORMAL LOW (ref >60)
GFR calc non Af Amer: 46 mL/min — ABNORMAL LOW (ref >60)
Glucose, Bld: 98 mg/dL (ref 70–99)
Potassium: 3.9 mmol/L (ref 3.5–5.1)
SODIUM: 139 mmol/L (ref 135–145)

## 2018-04-17 LAB — CBC WITH DIFFERENTIAL/PLATELET
Abs Immature Granulocytes: 0.05 10*3/uL (ref 0.00–0.07)
Basophils Absolute: 0.1 10*3/uL (ref 0.0–0.1)
Basophils Relative: 0 %
EOS ABS: 0.3 10*3/uL (ref 0.0–0.5)
EOS PCT: 3 %
HCT: 39.8 % (ref 39.0–52.0)
Hemoglobin: 12.8 g/dL — ABNORMAL LOW (ref 13.0–17.0)
IMMATURE GRANULOCYTES: 0 %
Lymphocytes Relative: 11 %
Lymphs Abs: 1.5 10*3/uL (ref 0.7–4.0)
MCH: 29.9 pg (ref 26.0–34.0)
MCHC: 32.2 g/dL (ref 30.0–36.0)
MCV: 93 fL (ref 80.0–100.0)
Monocytes Absolute: 1.4 10*3/uL — ABNORMAL HIGH (ref 0.1–1.0)
Monocytes Relative: 11 %
NEUTROS PCT: 75 %
NRBC: 0 % (ref 0.0–0.2)
Neutro Abs: 9.7 10*3/uL — ABNORMAL HIGH (ref 1.7–7.7)
PLATELETS: 286 10*3/uL (ref 150–400)
RBC: 4.28 MIL/uL (ref 4.22–5.81)
RDW: 13.1 % (ref 11.5–15.5)
WBC: 13 10*3/uL — AB (ref 4.0–10.5)

## 2018-04-17 LAB — URINALYSIS, ROUTINE W REFLEX MICROSCOPIC
Bilirubin Urine: NEGATIVE
Glucose, UA: NEGATIVE mg/dL
HGB URINE DIPSTICK: NEGATIVE
KETONES UR: NEGATIVE mg/dL
Leukocytes,Ua: NEGATIVE
Nitrite: NEGATIVE
PROTEIN: NEGATIVE mg/dL
Specific Gravity, Urine: 1.005 (ref 1.005–1.030)
pH: 6 (ref 5.0–8.0)

## 2018-04-17 MED ORDER — MORPHINE SULFATE (PF) 2 MG/ML IV SOLN
2.0000 mg | Freq: Once | INTRAVENOUS | Status: AC
  Administered 2018-04-17: 2 mg via INTRAVENOUS
  Filled 2018-04-17: qty 1

## 2018-04-17 MED ORDER — MORPHINE SULFATE (PF) 4 MG/ML IV SOLN
4.0000 mg | Freq: Once | INTRAVENOUS | Status: AC
  Administered 2018-04-17: 4 mg via INTRAVENOUS
  Filled 2018-04-17: qty 1

## 2018-04-17 MED ORDER — ONDANSETRON HCL 4 MG/2ML IJ SOLN
4.0000 mg | Freq: Once | INTRAMUSCULAR | Status: AC
  Administered 2018-04-17: 4 mg via INTRAVENOUS
  Filled 2018-04-17: qty 2

## 2018-04-17 MED ORDER — HYDROCODONE-ACETAMINOPHEN 5-325 MG PO TABS: 1.0000 | ORAL_TABLET | ORAL | 0 refills | Status: DC | PRN

## 2018-04-17 NOTE — ED Notes (Signed)
"  Patient verbalizes understanding of discharge instructions. Opportunity for questioning and answers were provided.  pt discharged from ED ."  

## 2018-04-17 NOTE — ED Notes (Signed)
Pt states he will call his son to drive him home

## 2018-04-17 NOTE — ED Triage Notes (Signed)
Pt reports r side pain x1 week. Reports pain has increased with movement and when he takes a deep breath. Denies injury.

## 2018-04-17 NOTE — ED Notes (Signed)
Pt transported to CT ?

## 2018-04-17 NOTE — ED Notes (Signed)
Pt transported to xray 

## 2018-04-17 NOTE — ED Provider Notes (Signed)
Signout from Dr. Gilford Raid.  85 year old male with right-sided back pain. Physical Exam  BP (!) 143/61   Pulse 71   Temp 99 F (37.2 C) (Oral)   Resp 16   Ht 5\' 7"  (1.702 m)   Wt 46.7 kg   SpO2 95%   BMI 16.13 kg/m   Physical Exam  ED Course/Procedures   Clinical Course as of Apr 16 2225  Sat Apr 17, 2018  1627 Patient urinalysis negative.  Will discharge as planned.   [MB]    Clinical Course User Index [MB] Hayden Rasmussen, MD    Procedures  MDM  Plan is to follow-up with urinalysis and discharge if negative.  Treat if positive.      Hayden Rasmussen, MD 04/17/18 2228

## 2018-04-17 NOTE — ED Provider Notes (Addendum)
[facility] EMERGENCY DEPARTMENT Provider Note   CSN: [id number] Arrival date & time: 04/17/18  1403    History   Chief Complaint Chief Complaint  Patient presents with  . Back Pain    HPI Angel Cooper is a 85 y.o. male.     Pt presents to the ED today with bilateral LBP.  The pt said he noticed it about a week ago on the left side.  It is now on the right side.  It hurts with movement.  The pt denies any n/v or f/c.  He has not taken any meds today for the pain.  He took tylenol yesterday.  He denies urinary sx.  He has a hx of a right sided nephrectomy.  He denies any trauma.     Past Medical History:  Diagnosis Date  . Atrial fibrillation ([place])    2015  W/ RVR  . Bladder tumor   . CHF (congestive heart failure) ([place]) 2015  . CKD (chronic kidney disease), stage III ([place])   . Coronary artery disease    NO LONGER SEES CARDIOLOGIST - LAST SAW DR. [name] 2 OR MORE YRS AGO-PT SEES PCP DR [name]  . GERD (gastroesophageal reflux disease)   . Hiatal hernia   . History of colon polyps   . History of gastric ulcer    1989  peptic and duodenal  . History of MI (myocardial infarction)    1995  . History of non-ST elevation myocardial infarction (NSTEMI)    07-05-1999  s/p cabg  . Hyperlipidemia   . Hypertension   . OA (osteoarthritis)   . S/P CABG (coronary artery bypass graft)    06/ 2001  . Ureteral carcinoma, right [facility]) urologist-  dr herrick/  oncologist- dr [name]   dx 2014  s/p  right nephroureterectomy 01-24-2013 (T3, N0) invasive high grade urothelial carcinoma--     Patient Active Problem List   Diagnosis Date Noted  . Atrial fibrillation with RVR ([place]) 01/28/2013  . CAD (coronary artery disease) of artery bypass graft 01/28/2013  . CKD (chronic kidney disease) stage 3, GFR 30-59 ml/min ([facility]) 01/28/2013  . Ureteral cancer ([place]) 01/24/2013    Past Surgical History:  Procedure Laterality Date  . CARDIAC CATHETERIZATION   07/05/1999   non-q MI;  3vessel CAD w/ high grade pLAD and mLAD, subtotal ramus branch,  OM1 70-80%,  ostialCFX 50-60%,  dCFx 80%,  RI 99%,  total occluded pRCA and fills last by right-to-right collaterals,  pLM  20% narrowing;  severe inferoapical hypokinesis  . CATARACT EXTRACTION W/ INTRAOCULAR LENS  IMPLANT, BILATERAL    . CORONARY ARTERY BYPASS GRAFT  06/ 2001  dr [name] tright  . CYSTOSCOPY W/ RETROGRADES Left 01/31/2016   Procedure: CYSTOSCOPY WITH RETROGRADE PYELOGRAM;  Surgeon: [name], MD;  Location: [facility];  Service: Urology;  Laterality: Left;  . CYSTOSCOPY WITH RETROGRADE PYELOGRAM, URETEROSCOPY AND STENT PLACEMENT Right 12/03/2012   Procedure: CYSTOSCOPY WITH RIGHT RETROGRADE PYELOGRAM,RIGHT URETERAL WASHINGS, RIGHT URETERAL BIOPSY, RIGHT URETEROSCOPY AND STENT PLACEMENT;  Surgeon: [name], MD;  Location: [facility];  Service: Urology;  Laterality: Right;  . CYSTOSCOPY WITH RETROGRADE PYELOGRAM, URETEROSCOPY AND STENT PLACEMENT Right 12/15/2012   Procedure: CYSTOSCOPY WITH RIGHT RETROGRADE PYELOGRAM, POSSIBLE RIGHT URETEROSCOPY AND STENT PLACEMENT AND REMOVAL OF LEFT STENT;  Surgeon: [name], MD;  Location: [facility];  Service: Urology;  Laterality: Right;  . CYSTOSCOPY WITH STENT PLACEMENT Right 01/24/2013   Procedure:  CYSTOSCOPY WITH STENT PLACEMENT;  Surgeon: [name], MD;  Location: [facility];  Service: Urology;  Laterality: Right;  . INCISION AND DRAINAGE ABSCESS Left 06/02/2016   Procedure: INCISION AND DRAINAGE LEFT SMALL FINGER;  Surgeon: [name], MD;  Location: [facility];  Service: Orthopedics;  Laterality: Left;  . ROBOT ASSITED LAPAROSCOPIC NEPHROURETERECTOMY Right 01/24/2013   Procedure: ROBOT ASSISTED LAPAROSCOPIC NEPHROURETERECTOMY WITH BILATERAL LYMPH NODE DISSECTION ;  Surgeon: [name], MD;  Location: [facility];  Service: Urology;  Laterality: Right;  right kidney  . TRANSTHORACIC ECHOCARDIOGRAM   01/28/2013   mild LVH,  ef 45-50%, diffuse hypokinesis,  grade 1 diastolic dysfunction/  moderate LAE/  trivial PR/  mild TR, PASP 50-21mmHg  . TRANSURETHRAL RESECTION OF BLADDER TUMOR N/A 01/31/2016   Procedure: TRANSURETHRAL RESECTION OF BLADDER TUMOR (TURBT);  Surgeon: [name], MD;  Location: [facility];  Service: Urology;  Laterality: N/A;        Home Medications    Prior to Admission medications   Medication Sig Start Date End Date Taking? Authorizing Provider  acetaminophen (TYLENOL) 500 MG tablet Take 1,000 mg by mouth every 6 (six) hours as needed for mild pain or headache.    Yes [provider]  amLODipine (NORVASC) 5 MG tablet Take 5 mg by mouth every morning.   Yes [provider]  CYANOCOBALAMIN IJ Inject as directed every 30 (thirty) days.   Yes [provider]  Famotidine (PEPCID PO) Take 1 tablet by mouth daily.   Yes [provider]  furosemide (LASIX) 40 MG tablet Take 80 mg by mouth every morning.    Yes [provider]  psyllium (METAMUCIL) 58.6 % powder Take 1 packet by mouth daily as needed (constipation).   Yes [provider]  traMADol (ULTRAM) 50 MG tablet Take 1-2 tablets (50-100 mg total) by mouth every 6 (six) hours as needed. Patient taking differently: Take 50-100 mg by mouth every 6 (six) hours as needed for moderate pain.  01/31/16  Yes [name], MD  HYDROcodone-acetaminophen (NORCO/VICODIN) 5-325 MG tablet Take 1 tablet by mouth every 4 (four) hours as needed. 04/17/18   Isla Pence, MD  oxyCODONE-acetaminophen (PERCOCET/ROXICET) 5-325 MG tablet Take 1-2 tablets by mouth every 6 (six) hours as needed for severe pain. Patient not taking: Reported on 04/17/2018 09/26/16   Davonna Belling, MD  phenazopyridine (PYRIDIUM) 200 MG tablet Take 1 tablet (200 mg total) by mouth 3 (three) times daily as needed for pain. Patient not taking: Reported on 04/17/2018 01/31/16   [name], MD  predniSONE (DELTASONE) 20 MG tablet Take 2 tablets (40 mg total) by mouth daily. Patient not taking: Reported on 04/17/2018 09/26/16   Davonna Belling, MD  sulfamethoxazole-trimethoprim (BACTRIM DS,SEPTRA DS) 800-160 MG tablet Take 1 tablet by mouth 2 (two) times daily. Patient not taking: Reported on 04/17/2018 06/02/16   [name], MD    Family History No family history on file.  Social History Social History   Tobacco Use  . Smoking status: Former Smoker    Last attempt to quit: 01/27/1958    Years since quitting: 60.2  . Smokeless tobacco: Never Used  Substance Use Topics  . Alcohol use: Yes    Comment: OCCASIONAL  . Drug use: No     Allergies   Aspirin; Benadryl [diphenhydramine hcl (sleep)]; Chlorhexidine; and Enalapril   Review of Systems Review of Systems  Musculoskeletal: Positive for back pain.  All other systems  reviewed and are negative.    Physical Exam Updated Vital Signs BP (!) 158/69   Pulse 91   Temp 99 F (37.2 C) (Oral)   Resp 16   Ht 5\' 7"  (1.702 m)   Wt 46.7 kg   SpO2 99%   BMI 16.13 kg/m   Physical Exam Vitals signs and nursing note reviewed.  Constitutional:      Appearance: Normal appearance.  HENT:     Head: Normocephalic and atraumatic.     Right Ear: External ear normal.     Left Ear: External ear normal.     Nose: Nose normal.     Mouth/Throat:     Mouth: Mucous membranes are moist.  Eyes:     Extraocular Movements: Extraocular movements intact.     Conjunctiva/sclera: Conjunctivae normal.     Pupils: Pupils are equal, round, and reactive to light.  Neck:     Musculoskeletal: Normal range of motion and neck supple.  Cardiovascular:     Rate and Rhythm: Normal rate and regular rhythm.     Pulses: Normal pulses.     Heart sounds: Normal heart sounds.  Pulmonary:     Effort: Pulmonary effort is normal.     Breath sounds: Normal breath sounds.  Abdominal:     General: Abdomen is flat. Bowel sounds are  normal.     Palpations: Abdomen is soft.  Musculoskeletal: Normal range of motion.       Arms:  Skin:    General: Skin is warm.     Capillary Refill: Capillary refill takes less than 2 seconds.  Neurological:     General: No focal deficit present.     Mental Status: He is alert and oriented to person, place, and time.  Psychiatric:        Mood and Affect: Mood normal.        Behavior: Behavior normal.      ED Treatments / Results  Labs (all labs ordered are listed, but only abnormal results are displayed) Labs Reviewed  BASIC METABOLIC PANEL - Abnormal; Notable for the following components:      Result Value   BUN 26 (*)    Creatinine, Ser 1.37 (*)    GFR calc non Af Amer 46 (*)    GFR calc Af Amer 54 (*)    All other components within normal limits  CBC WITH DIFFERENTIAL/PLATELET - Abnormal; Notable for the following components:   WBC 13.0 (*)    Hemoglobin 12.8 (*)    Neutro Abs 9.7 (*)    Monocytes Absolute 1.4 (*)    All other components within normal limits  URINALYSIS, ROUTINE W REFLEX MICROSCOPIC - Abnormal; Notable for the following components:   Color, Urine STRAW (*)    All other components within normal limits    EKG None  Radiology Dg Chest 2 View  Result Date: 04/17/2018 CLINICAL DATA:  Right-sided chest pain for 1 week EXAM: CHEST - 2 VIEW COMPARISON:  05/20/2016 FINDINGS: Cardiac shadow is stable. Postsurgical changes are noted. Hiatal hernia is again seen. Mild fibrotic changes are again noted in the bases. No focal infiltrate or sizable effusion is seen. No acute bony abnormality is noted. IMPRESSION: Chronic changes in the bases. Hiatal hernia. No acute abnormality noted. Electronically Signed   By: Inez Catalina M.D.   On: 04/17/2018 15:17   Ct Renal Stone Study  Result Date: 04/17/2018 CLINICAL DATA:  Right flank and low back pain for 1 week. EXAM: CT ABDOMEN  AND PELVIS WITHOUT CONTRAST TECHNIQUE: Multidetector CT imaging of the abdomen and pelvis  was performed following the standard protocol without IV contrast. COMPARISON:  CT abdomen and pelvis 12/24/2015. FINDINGS: Lower chest: Heart size is upper normal. Calcific aortic and coronary atherosclerosis is noted. There is mild dependent atelectasis. Hepatobiliary: 2-3 punctate calcifications in the liver incidentally noted. The liver otherwise appears normal. Gallbladder and biliary tree are unremarkable. Pancreas: Unremarkable. No pancreatic ductal dilatation or surrounding inflammatory changes. Spleen: Normal in size without focal abnormality. Adrenals/Urinary Tract: Status post right nephrectomy. The left kidney, ureter and urinary bladder appear normal. The adrenal glands are unremarkable. Stomach/Bowel: Diverticulosis without diverticulitis is most notable in the sigmoid. The colon is otherwise unremarkable. Moderate hiatal hernia is seen. Small bowel appears normal. The patient appears status post appendectomy. Vascular/Lymphatic: Ectatic abdominal aorta at 2.7 cm in diameter is unchanged. No lymphadenopathy. Reproductive: Mild prostatomegaly. Other: None. Musculoskeletal: Multilevel thoracic and lumbar degenerative change noted. No acute or focal abnormality. IMPRESSION: No acute abnormality abdomen or pelvis. Negative for urinary tract stone. Status post right nephrectomy. Moderate hiatal hernia. Calcific aortic and coronary atherosclerosis. Diverticulosis without diverticulitis. Electronically Signed   By: Inge Rise M.D.   On: 04/17/2018 15:55    Procedures Procedures (including critical care time)  Medications Ordered in ED Medications  morphine 2 MG/ML injection 2 mg (2 mg Intravenous Given 04/17/18 1454)  ondansetron (ZOFRAN) injection 4 mg (4 mg Intravenous Given 04/17/18 1453)  morphine 4 MG/ML injection 4 mg (4 mg Intravenous Given 04/17/18 1624)     Initial Impression / Assessment and Plan / ED Course  I have reviewed the triage vital signs and the nursing notes.   Pertinent labs & imaging results that were available during my care of the patient were reviewed by me and considered in my medical decision making (see chart for details).  Clinical Course as of Apr 17 708  Sat Apr 17, 2018  1627 Patient urinalysis negative.  Will discharge as planned.   [MB]    Clinical Course User Index [MB] Hayden Rasmussen, MD   Pt is feeling better, but still has some pain, so he is given additional morphine.  CT renal ok.  UA pending.  Pt signed out to Dr. Melina Copa.     Final Clinical Impressions(s) / ED Diagnoses   Final diagnoses:  Acute bilateral low back pain without sciatica    ED Discharge Orders         Ordered    HYDROcodone-acetaminophen (NORCO/VICODIN) 5-325 MG tablet  Every 4 hours PRN     04/17/18 1603           Isla Pence, MD 04/17/18 1603    Isla Pence, MD 04/18/18 0710

## 2018-04-22 DIAGNOSIS — D510 Vitamin B12 deficiency anemia due to intrinsic factor deficiency: Secondary | ICD-10-CM | POA: Diagnosis not present

## 2018-08-13 DIAGNOSIS — X32XXXD Exposure to sunlight, subsequent encounter: Secondary | ICD-10-CM | POA: Diagnosis not present

## 2018-08-13 DIAGNOSIS — L820 Inflamed seborrheic keratosis: Secondary | ICD-10-CM | POA: Diagnosis not present

## 2018-08-13 DIAGNOSIS — D0439 Carcinoma in situ of skin of other parts of face: Secondary | ICD-10-CM | POA: Diagnosis not present

## 2018-08-13 DIAGNOSIS — Z1283 Encounter for screening for malignant neoplasm of skin: Secondary | ICD-10-CM | POA: Diagnosis not present

## 2018-08-13 DIAGNOSIS — L308 Other specified dermatitis: Secondary | ICD-10-CM | POA: Diagnosis not present

## 2018-08-13 DIAGNOSIS — D225 Melanocytic nevi of trunk: Secondary | ICD-10-CM | POA: Diagnosis not present

## 2018-08-13 DIAGNOSIS — L570 Actinic keratosis: Secondary | ICD-10-CM | POA: Diagnosis not present

## 2018-09-10 DIAGNOSIS — C44619 Basal cell carcinoma of skin of left upper limb, including shoulder: Secondary | ICD-10-CM | POA: Diagnosis not present

## 2018-09-10 DIAGNOSIS — Z85828 Personal history of other malignant neoplasm of skin: Secondary | ICD-10-CM | POA: Diagnosis not present

## 2018-09-10 DIAGNOSIS — X32XXXD Exposure to sunlight, subsequent encounter: Secondary | ICD-10-CM | POA: Diagnosis not present

## 2018-09-10 DIAGNOSIS — Z08 Encounter for follow-up examination after completed treatment for malignant neoplasm: Secondary | ICD-10-CM | POA: Diagnosis not present

## 2018-09-10 DIAGNOSIS — L820 Inflamed seborrheic keratosis: Secondary | ICD-10-CM | POA: Diagnosis not present

## 2018-09-10 DIAGNOSIS — L570 Actinic keratosis: Secondary | ICD-10-CM | POA: Diagnosis not present

## 2018-09-28 DIAGNOSIS — Z23 Encounter for immunization: Secondary | ICD-10-CM | POA: Diagnosis not present

## 2018-09-28 DIAGNOSIS — E538 Deficiency of other specified B group vitamins: Secondary | ICD-10-CM | POA: Diagnosis not present

## 2018-09-28 DIAGNOSIS — M792 Neuralgia and neuritis, unspecified: Secondary | ICD-10-CM | POA: Diagnosis not present

## 2018-09-28 DIAGNOSIS — N183 Chronic kidney disease, stage 3 (moderate): Secondary | ICD-10-CM | POA: Diagnosis not present

## 2018-09-28 DIAGNOSIS — M79606 Pain in leg, unspecified: Secondary | ICD-10-CM | POA: Diagnosis not present

## 2018-11-01 DIAGNOSIS — D510 Vitamin B12 deficiency anemia due to intrinsic factor deficiency: Secondary | ICD-10-CM | POA: Diagnosis not present

## 2018-11-16 DIAGNOSIS — R29898 Other symptoms and signs involving the musculoskeletal system: Secondary | ICD-10-CM | POA: Diagnosis not present

## 2018-11-16 DIAGNOSIS — I129 Hypertensive chronic kidney disease with stage 1 through stage 4 chronic kidney disease, or unspecified chronic kidney disease: Secondary | ICD-10-CM | POA: Diagnosis not present

## 2018-11-16 DIAGNOSIS — R634 Abnormal weight loss: Secondary | ICD-10-CM | POA: Diagnosis not present

## 2018-11-16 DIAGNOSIS — K219 Gastro-esophageal reflux disease without esophagitis: Secondary | ICD-10-CM | POA: Diagnosis not present

## 2018-11-20 IMAGING — CT CT ANGIO CHEST
3 of 6 series · 10 of 30 positions shown · IV contrast (50CC ISOVUE 370)
Comparison: Chest radiograph December 17, 2015

CLINICAL DATA: Chest pain. History of renal cell carcinoma in
urinary bladder carcinoma

EXAM:
CT ANGIOGRAPHY CHEST WITH CONTRAST
TECHNIQUE: Multidetector CT imaging of the chest was performed using the
standard protocol during bolus administration of intravenous
contrast. Multiplanar CT image reconstructions and MIPs were
obtained to evaluate the vascular anatomy.
CONTRAST:  50 mL Isovue 370 nonionic

[Series 3: pe 1.25 · axial · 0.70mm/px · z∈[-201,-21]mm · 5 of 218 slices shown]
[im 37/218  lung]
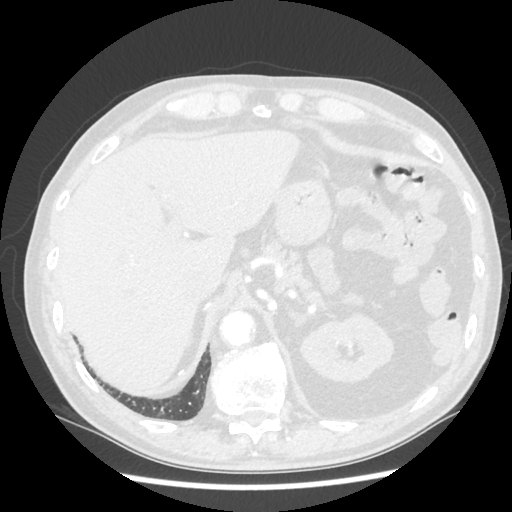
[im 73/218  mediastinal]
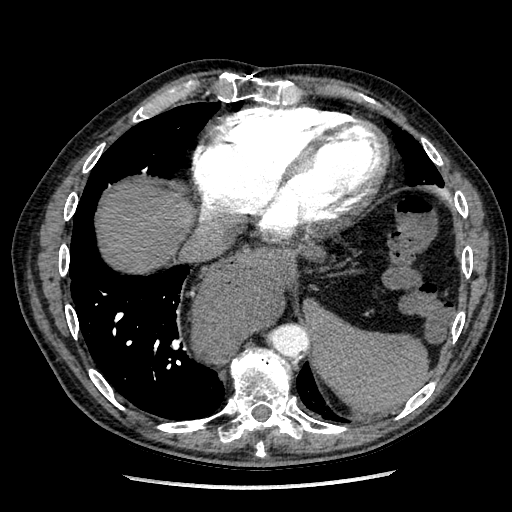
[im 109/218  lung]
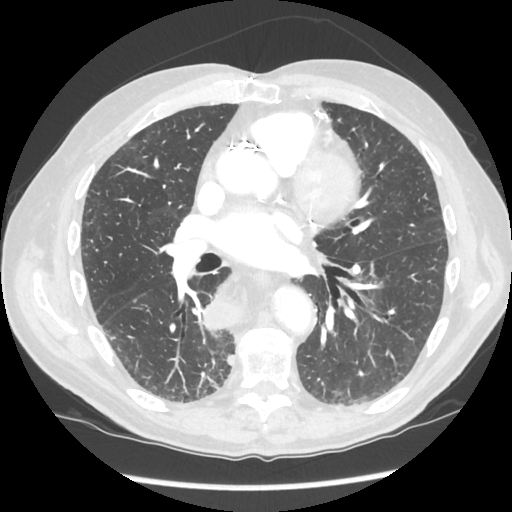
[im 145/218  mediastinal]
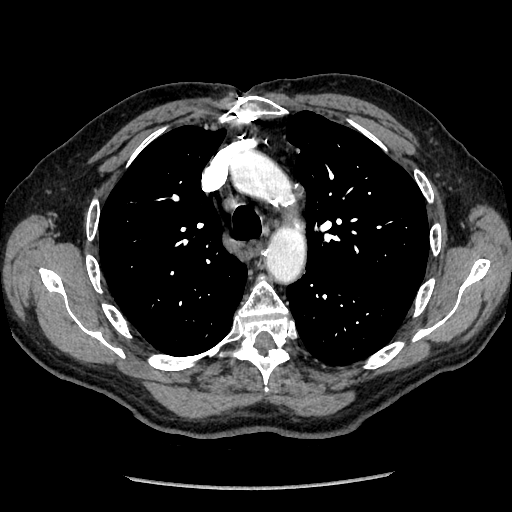
[im 181/218  lung]
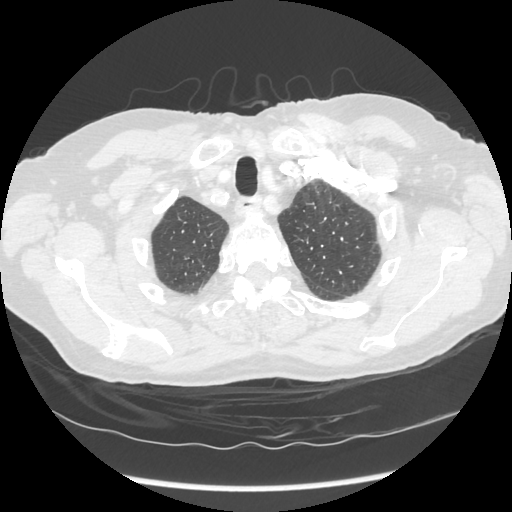

[Series 4: pe 2.5 · axial · 0.70mm/px · z∈[-155,-65]mm · 2 of 109 slices shown]
[im 37/109  lung]
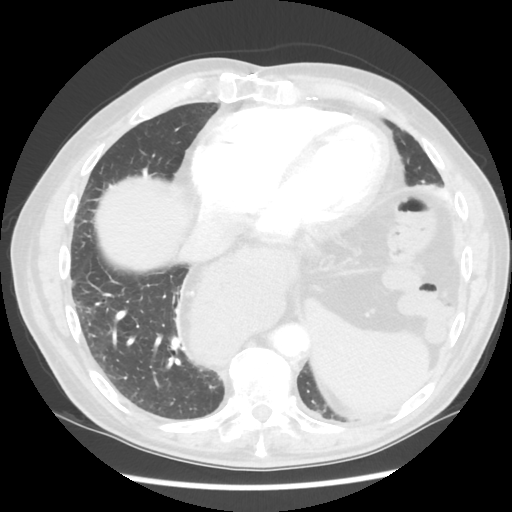
[im 73/109  lung]
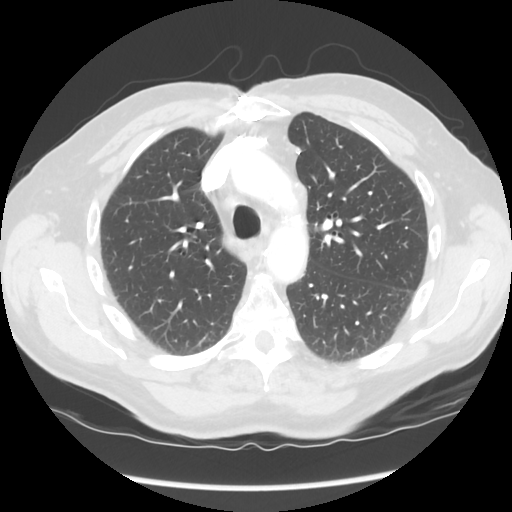

[Series 602: sagittal body · sagittal · 0.70mm/px · 3 of 145 slices shown]
[im 37/145  lung]
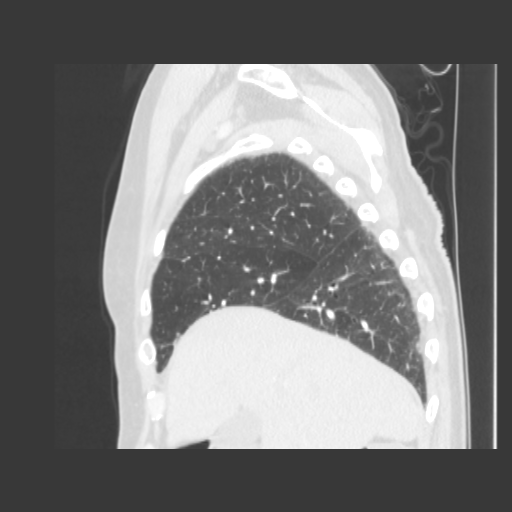
[im 73/145  lung]
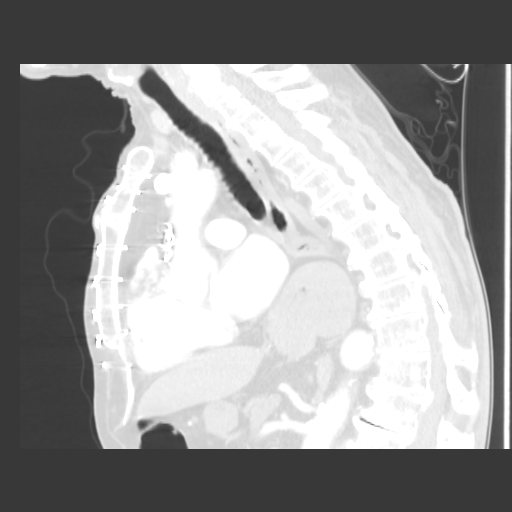
[im 109/145  lung]
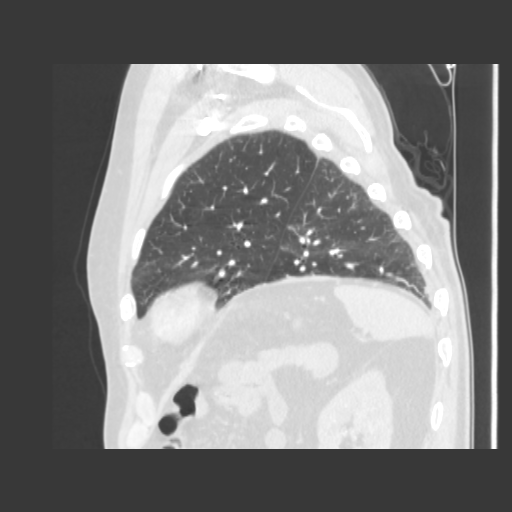

[10 of 30 positions shown; findings below may reference images not displayed]

FINDINGS: Cardiovascular: There is no demonstrable pulmonary embolus. There is
no thoracic aortic aneurysm or dissection. There are foci of
atherosclerotic calcification in the visualized proximal great
vessels. There are foci of atherosclerotic calcification in the
aorta. The patient is status post coronary artery bypass grafting.
There are multiple foci of native coronary artery calcification.
Pericardium is not thickened.

Mediastinum/Nodes: Thyroid appears unremarkable. There are
subcentimeter mediastinal lymph nodes but no adenopathy by size
criteria. There is a large hiatal hernia with much of the stomach
above the diaphragm.

Lungs/Pleura: There is mild bibasilar fibrosis. There is also mild
bibasilar atelectasis. There is no edema or consolidation. On axial
slice 64 series 5, there is a 4 mm nodular opacity in the anterior
segment of the left lower lobe. There is no appreciable pleural
effusion or pleural thickening. There is elevation of the left
hemidiaphragm

Upper Abdomen: There is surgical absence the right kidney. No lesion
is seen in the right renal fossa. There are occasional small liver
granulomas. Visualized upper abdominal structures otherwise appear
unremarkable.

Musculoskeletal: There is degenerative change in the thoracic spine.
Patient is status post median sternotomy. There are no blastic or
lytic bone lesions.

Review of the MIP images confirms the above findings.
IMPRESSION: No demonstrable pulmonary embolus.

Mild bibasilar fibrosis and patchy atelectasis. No edema or
consolidation. 4 mm nodular opacity in the anterior segment left
lower lobe. No follow-up needed if patient is low-risk. Non-contrast
chest CT can be considered in 12 months if patient is high-risk.
This recommendation follows the consensus statement: Guidelines for
Management of Incidental Pulmonary Nodules Detected on CT Images:

No evident adenopathy.

Large hiatal hernia with much of the stomach above the diaphragm.

Status post coronary artery bypass grafting. Multiple foci of
atherosclerotic calcification noted.

Status post right nephrectomy.

There is elevation of the left hemidiaphragm, a finding that was not
appreciable previously. Cause for this finding uncertain.

## 2018-12-01 DIAGNOSIS — R519 Headache, unspecified: Secondary | ICD-10-CM | POA: Diagnosis not present

## 2018-12-02 ENCOUNTER — Ambulatory Visit: Payer: PPO | Admitting: Physical Therapy

## 2018-12-05 DIAGNOSIS — L089 Local infection of the skin and subcutaneous tissue, unspecified: Secondary | ICD-10-CM | POA: Diagnosis not present

## 2018-12-07 ENCOUNTER — Emergency Department (HOSPITAL_COMMUNITY): Payer: PPO

## 2018-12-07 ENCOUNTER — Inpatient Hospital Stay (HOSPITAL_COMMUNITY): Payer: PPO | Admitting: Certified Registered"

## 2018-12-07 ENCOUNTER — Observation Stay (HOSPITAL_COMMUNITY)
Admission: EM | Admit: 2018-12-07 | Discharge: 2018-12-08 | Disposition: A | Discharge status: Home / Self Care | Payer: PPO | Attending: Internal Medicine | Admitting: Internal Medicine

## 2018-12-07 ENCOUNTER — Other Ambulatory Visit: Payer: Self-pay

## 2018-12-07 ENCOUNTER — Encounter (HOSPITAL_COMMUNITY): Payer: Self-pay | Admitting: Surgery

## 2018-12-07 ENCOUNTER — Encounter (HOSPITAL_COMMUNITY)
Admission: EM | Disposition: A | Discharge status: Home / Self Care | Payer: Self-pay | Source: Home / Self Care | Attending: Emergency Medicine

## 2018-12-07 DIAGNOSIS — Z8551 Personal history of malignant neoplasm of bladder: Secondary | ICD-10-CM | POA: Insufficient documentation

## 2018-12-07 DIAGNOSIS — Z888 Allergy status to other drugs, medicaments and biological substances status: Secondary | ICD-10-CM | POA: Diagnosis not present

## 2018-12-07 DIAGNOSIS — B9561 Methicillin susceptible Staphylococcus aureus infection as the cause of diseases classified elsewhere: Secondary | ICD-10-CM | POA: Diagnosis not present

## 2018-12-07 DIAGNOSIS — I2510 Atherosclerotic heart disease of native coronary artery without angina pectoris: Secondary | ICD-10-CM | POA: Diagnosis not present

## 2018-12-07 DIAGNOSIS — N183 Chronic kidney disease, stage 3 unspecified: Secondary | ICD-10-CM | POA: Insufficient documentation

## 2018-12-07 DIAGNOSIS — Z951 Presence of aortocoronary bypass graft: Secondary | ICD-10-CM | POA: Diagnosis not present

## 2018-12-07 DIAGNOSIS — Z20828 Contact with and (suspected) exposure to other viral communicable diseases: Secondary | ICD-10-CM | POA: Insufficient documentation

## 2018-12-07 DIAGNOSIS — Z79899 Other long term (current) drug therapy: Secondary | ICD-10-CM | POA: Insufficient documentation

## 2018-12-07 DIAGNOSIS — Z03818 Encounter for observation for suspected exposure to other biological agents ruled out: Secondary | ICD-10-CM | POA: Diagnosis not present

## 2018-12-07 DIAGNOSIS — D72829 Elevated white blood cell count, unspecified: Secondary | ICD-10-CM | POA: Diagnosis present

## 2018-12-07 DIAGNOSIS — L03012 Cellulitis of left finger: Secondary | ICD-10-CM | POA: Diagnosis not present

## 2018-12-07 DIAGNOSIS — I509 Heart failure, unspecified: Secondary | ICD-10-CM | POA: Insufficient documentation

## 2018-12-07 DIAGNOSIS — L089 Local infection of the skin and subcutaneous tissue, unspecified: Secondary | ICD-10-CM

## 2018-12-07 DIAGNOSIS — M009 Pyogenic arthritis, unspecified: Secondary | ICD-10-CM | POA: Diagnosis not present

## 2018-12-07 DIAGNOSIS — L03019 Cellulitis of unspecified finger: Secondary | ICD-10-CM | POA: Diagnosis present

## 2018-12-07 DIAGNOSIS — I480 Paroxysmal atrial fibrillation: Secondary | ICD-10-CM | POA: Insufficient documentation

## 2018-12-07 DIAGNOSIS — I10 Essential (primary) hypertension: Secondary | ICD-10-CM | POA: Diagnosis not present

## 2018-12-07 DIAGNOSIS — Z87891 Personal history of nicotine dependence: Secondary | ICD-10-CM | POA: Insufficient documentation

## 2018-12-07 DIAGNOSIS — D510 Vitamin B12 deficiency anemia due to intrinsic factor deficiency: Secondary | ICD-10-CM | POA: Diagnosis not present

## 2018-12-07 DIAGNOSIS — Z886 Allergy status to analgesic agent status: Secondary | ICD-10-CM | POA: Insufficient documentation

## 2018-12-07 DIAGNOSIS — I25810 Atherosclerosis of coronary artery bypass graft(s) without angina pectoris: Secondary | ICD-10-CM | POA: Diagnosis not present

## 2018-12-07 DIAGNOSIS — I252 Old myocardial infarction: Secondary | ICD-10-CM | POA: Insufficient documentation

## 2018-12-07 DIAGNOSIS — M109 Gout, unspecified: Secondary | ICD-10-CM | POA: Diagnosis present

## 2018-12-07 DIAGNOSIS — K449 Diaphragmatic hernia without obstruction or gangrene: Secondary | ICD-10-CM | POA: Diagnosis not present

## 2018-12-07 DIAGNOSIS — K219 Gastro-esophageal reflux disease without esophagitis: Secondary | ICD-10-CM | POA: Insufficient documentation

## 2018-12-07 DIAGNOSIS — M869 Osteomyelitis, unspecified: Secondary | ICD-10-CM | POA: Diagnosis not present

## 2018-12-07 DIAGNOSIS — M79645 Pain in left finger(s): Secondary | ICD-10-CM | POA: Diagnosis not present

## 2018-12-07 DIAGNOSIS — I130 Hypertensive heart and chronic kidney disease with heart failure and stage 1 through stage 4 chronic kidney disease, or unspecified chronic kidney disease: Secondary | ICD-10-CM | POA: Insufficient documentation

## 2018-12-07 DIAGNOSIS — D631 Anemia in chronic kidney disease: Secondary | ICD-10-CM | POA: Diagnosis not present

## 2018-12-07 DIAGNOSIS — M868X4 Other osteomyelitis, hand: Secondary | ICD-10-CM | POA: Diagnosis not present

## 2018-12-07 DIAGNOSIS — D638 Anemia in other chronic diseases classified elsewhere: Secondary | ICD-10-CM | POA: Diagnosis present

## 2018-12-07 DIAGNOSIS — M1990 Unspecified osteoarthritis, unspecified site: Secondary | ICD-10-CM | POA: Diagnosis not present

## 2018-12-07 HISTORY — PX: AMPUTATION FINGER: SHX6594

## 2018-12-07 HISTORY — DX: Osteomyelitis, unspecified: M86.9

## 2018-12-07 HISTORY — PX: I&D EXTREMITY: SHX5045

## 2018-12-07 LAB — CBC WITH DIFFERENTIAL/PLATELET
Abs Immature Granulocytes: 0.04 10*3/uL (ref 0.00–0.07)
Basophils Absolute: 0.1 10*3/uL (ref 0.0–0.1)
Basophils Relative: 1 %
Eosinophils Absolute: 0.7 10*3/uL — ABNORMAL HIGH (ref 0.0–0.5)
Eosinophils Relative: 6 %
HCT: 36 % — ABNORMAL LOW (ref 39.0–52.0)
Hemoglobin: 11.9 g/dL — ABNORMAL LOW (ref 13.0–17.0)
Immature Granulocytes: 0 %
Lymphocytes Relative: 12 %
Lymphs Abs: 1.3 10*3/uL (ref 0.7–4.0)
MCH: 30.4 pg (ref 26.0–34.0)
MCHC: 33.1 g/dL (ref 30.0–36.0)
MCV: 91.8 fL (ref 80.0–100.0)
Monocytes Absolute: 0.8 10*3/uL (ref 0.1–1.0)
Monocytes Relative: 7 %
Neutro Abs: 7.7 10*3/uL (ref 1.7–7.7)
Neutrophils Relative %: 74 %
Platelets: 327 10*3/uL (ref 150–400)
RBC: 3.92 MIL/uL — ABNORMAL LOW (ref 4.22–5.81)
RDW: 13.9 % (ref 11.5–15.5)
WBC: 10.6 10*3/uL — ABNORMAL HIGH (ref 4.0–10.5)
nRBC: 0 % (ref 0.0–0.2)

## 2018-12-07 LAB — COMPREHENSIVE METABOLIC PANEL
ALT: 10 U/L (ref 0–44)
AST: 23 U/L (ref 15–41)
Albumin: 3.8 g/dL (ref 3.5–5.0)
Alkaline Phosphatase: 67 U/L (ref 38–126)
Anion gap: 11 (ref 5–15)
BUN: 19 mg/dL (ref 8–23)
CO2: 28 mmol/L (ref 22–32)
Calcium: 9.1 mg/dL (ref 8.9–10.3)
Chloride: 97 mmol/L — ABNORMAL LOW (ref 98–111)
Creatinine, Ser: 1.46 mg/dL — ABNORMAL HIGH (ref 0.61–1.24)
GFR calc Af Amer: 50 mL/min — ABNORMAL LOW (ref >60)
GFR calc non Af Amer: 43 mL/min — ABNORMAL LOW (ref >60)
Glucose, Bld: 131 mg/dL — ABNORMAL HIGH (ref 70–99)
Potassium: 3.6 mmol/L (ref 3.5–5.1)
Sodium: 136 mmol/L (ref 135–145)
Total Bilirubin: 0.6 mg/dL (ref 0.3–1.2)
Total Protein: 6.8 g/dL (ref 6.5–8.1)

## 2018-12-07 LAB — URIC ACID: Uric Acid, Serum: 9.7 mg/dL — ABNORMAL HIGH (ref 3.7–8.6)

## 2018-12-07 LAB — SARS CORONAVIRUS 2 BY RT PCR (HOSPITAL ORDER, PERFORMED IN ~~LOC~~ HOSPITAL LAB): SARS Coronavirus 2: NEGATIVE

## 2018-12-07 SURGERY — IRRIGATION AND DEBRIDEMENT EXTREMITY
Anesthesia: Monitor Anesthesia Care | Laterality: Left

## 2018-12-07 MED ORDER — FENTANYL CITRATE (PF) 100 MCG/2ML IJ SOLN
25.0000 ug | INTRAMUSCULAR | Status: DC | PRN
  Administered 2018-12-07: 20:00:00 50 ug via INTRAVENOUS
  Administered 2018-12-07 (×2): 25 ug via INTRAVENOUS
  Administered 2018-12-07: 19:00:00 50 ug via INTRAVENOUS

## 2018-12-07 MED ORDER — ENOXAPARIN SODIUM 40 MG/0.4ML ~~LOC~~ SOLN: 40.0000 mg | SUBCUTANEOUS | Status: DC

## 2018-12-07 MED ORDER — ONDANSETRON HCL 4 MG PO TABS: 4.0000 mg | ORAL_TABLET | Freq: Four times a day (QID) | ORAL | Status: DC | PRN

## 2018-12-07 MED ORDER — PANTOPRAZOLE SODIUM 40 MG PO TBEC
40.0000 mg | DELAYED_RELEASE_TABLET | Freq: Every day | ORAL | Status: DC
  Filled 2018-12-07: qty 1

## 2018-12-07 MED ORDER — ALPRAZOLAM 0.25 MG PO TABS: 0.2500 mg | ORAL_TABLET | Freq: Every evening | ORAL | Status: DC | PRN

## 2018-12-07 MED ORDER — FUROSEMIDE 80 MG PO TABS
80.0000 mg | ORAL_TABLET | Freq: Every morning | ORAL | Status: DC
  Administered 2018-12-08: 80 mg via ORAL
  Filled 2018-12-07: qty 2
  Filled 2018-12-07: qty 1

## 2018-12-07 MED ORDER — LIDOCAINE HCL (PF) 1 % IJ SOLN
INTRAMUSCULAR | Status: AC
  Filled 2018-12-07: qty 30

## 2018-12-07 MED ORDER — TRAMADOL HCL 50 MG PO TABS
50.0000 mg | ORAL_TABLET | Freq: Four times a day (QID) | ORAL | Status: DC | PRN
  Administered 2018-12-08: 50 mg via ORAL
  Filled 2018-12-07: qty 1

## 2018-12-07 MED ORDER — LIDOCAINE-EPINEPHRINE 1 %-1:100000 IJ SOLN
INTRAMUSCULAR | Status: AC
  Filled 2018-12-07: qty 1

## 2018-12-07 MED ORDER — CEFAZOLIN SODIUM-DEXTROSE 1-4 GM/50ML-% IV SOLN
1.0000 g | Freq: Three times a day (TID) | INTRAVENOUS | Status: DC
  Administered 2018-12-08 (×2): 1 g via INTRAVENOUS
  Filled 2018-12-07 (×2): qty 50

## 2018-12-07 MED ORDER — CEFAZOLIN SODIUM-DEXTROSE 2-4 GM/100ML-% IV SOLN
INTRAVENOUS | Status: AC
  Filled 2018-12-07: qty 100

## 2018-12-07 MED ORDER — FENTANYL CITRATE (PF) 100 MCG/2ML IJ SOLN
INTRAMUSCULAR | Status: AC
  Filled 2018-12-07: qty 2

## 2018-12-07 MED ORDER — FENTANYL CITRATE (PF) 250 MCG/5ML IJ SOLN
INTRAMUSCULAR | Status: AC
  Filled 2018-12-07: qty 5

## 2018-12-07 MED ORDER — ACETAMINOPHEN 650 MG RE SUPP: 650.0000 mg | Freq: Four times a day (QID) | RECTAL | Status: DC | PRN

## 2018-12-07 MED ORDER — 0.9 % SODIUM CHLORIDE (POUR BTL) OPTIME
TOPICAL | Status: DC | PRN
  Administered 2018-12-07: 1000 mL

## 2018-12-07 MED ORDER — PROPOFOL 10 MG/ML IV BOLUS
INTRAVENOUS | Status: DC | PRN
  Administered 2018-12-07: 20 mg via INTRAVENOUS

## 2018-12-07 MED ORDER — ACETAMINOPHEN 325 MG PO TABS
650.0000 mg | ORAL_TABLET | Freq: Four times a day (QID) | ORAL | Status: DC | PRN
  Administered 2018-12-07: 650 mg via ORAL
  Filled 2018-12-07: qty 2

## 2018-12-07 MED ORDER — ENOXAPARIN SODIUM 40 MG/0.4ML ~~LOC~~ SOLN
40.0000 mg | SUBCUTANEOUS | Status: DC
  Administered 2018-12-08: 10:00:00 40 mg via SUBCUTANEOUS
  Filled 2018-12-07: qty 0.4

## 2018-12-07 MED ORDER — COLCHICINE 0.6 MG PO TABS: 1.2000 mg | ORAL_TABLET | Freq: Once | ORAL | Status: DC

## 2018-12-07 MED ORDER — HYDROCODONE-ACETAMINOPHEN 5-325 MG PO TABS
1.0000 | ORAL_TABLET | ORAL | Status: DC | PRN
  Administered 2018-12-07 – 2018-12-08 (×3): 1 via ORAL
  Filled 2018-12-07 (×2): qty 1

## 2018-12-07 MED ORDER — AMLODIPINE BESYLATE 5 MG PO TABS
5.0000 mg | ORAL_TABLET | Freq: Every morning | ORAL | Status: DC
  Administered 2018-12-08: 10:00:00 5 mg via ORAL
  Filled 2018-12-07: qty 1

## 2018-12-07 MED ORDER — MORPHINE SULFATE (PF) 2 MG/ML IV SOLN: 2.0000 mg | INTRAVENOUS | Status: DC | PRN

## 2018-12-07 MED ORDER — SODIUM CHLORIDE 0.9 % IR SOLN
Status: DC | PRN
  Administered 2018-12-07: 3000 mL

## 2018-12-07 MED ORDER — COLCHICINE 0.6 MG PO TABS
0.6000 mg | ORAL_TABLET | Freq: Two times a day (BID) | ORAL | Status: DC
  Administered 2018-12-08: 0.6 mg via ORAL
  Filled 2018-12-07: qty 1

## 2018-12-07 MED ORDER — ONDANSETRON HCL 4 MG/2ML IJ SOLN: 4.0000 mg | Freq: Once | INTRAMUSCULAR | Status: DC | PRN

## 2018-12-07 MED ORDER — HYDROCODONE-ACETAMINOPHEN 5-325 MG PO TABS
ORAL_TABLET | ORAL | Status: AC
  Filled 2018-12-07: qty 1

## 2018-12-07 MED ORDER — POVIDONE-IODINE 10 % EX SWAB: 2.0000 "application " | Freq: Once | CUTANEOUS | Status: DC

## 2018-12-07 MED ORDER — ACETAMINOPHEN 325 MG PO TABS: 650.0000 mg | ORAL_TABLET | Freq: Four times a day (QID) | ORAL | Status: DC | PRN

## 2018-12-07 MED ORDER — FENTANYL CITRATE (PF) 100 MCG/2ML IJ SOLN
INTRAMUSCULAR | Status: AC
  Administered 2018-12-07: 50 ug via INTRAVENOUS
  Filled 2018-12-07: qty 2

## 2018-12-07 MED ORDER — ONDANSETRON HCL 4 MG/2ML IJ SOLN
4.0000 mg | Freq: Four times a day (QID) | INTRAMUSCULAR | Status: DC | PRN
  Administered 2018-12-07: 4 mg via INTRAVENOUS

## 2018-12-07 MED ORDER — LACTATED RINGERS IV SOLN
INTRAVENOUS | Status: DC | PRN
  Administered 2018-12-07: 17:00:00 via INTRAVENOUS

## 2018-12-07 MED ORDER — SODIUM CHLORIDE 0.9 % IV SOLN: INTRAVENOUS | Status: DC

## 2018-12-07 MED ORDER — ALBUTEROL SULFATE (2.5 MG/3ML) 0.083% IN NEBU: 2.5000 mg | INHALATION_SOLUTION | Freq: Four times a day (QID) | RESPIRATORY_TRACT | Status: DC | PRN

## 2018-12-07 MED ORDER — LIDOCAINE HCL (PF) 1 % IJ SOLN
INTRAMUSCULAR | Status: DC | PRN
  Administered 2018-12-07: 20 mL

## 2018-12-07 MED ORDER — CEFAZOLIN SODIUM-DEXTROSE 2-3 GM-%(50ML) IV SOLR
INTRAVENOUS | Status: DC | PRN
  Administered 2018-12-07: 2 g via INTRAVENOUS

## 2018-12-07 MED ORDER — ONDANSETRON HCL 4 MG/2ML IJ SOLN: 4.0000 mg | Freq: Four times a day (QID) | INTRAMUSCULAR | Status: DC | PRN

## 2018-12-07 MED ORDER — BUPIVACAINE HCL (PF) 0.25 % IJ SOLN
INTRAMUSCULAR | Status: AC
  Filled 2018-12-07: qty 30

## 2018-12-07 MED ORDER — ACETAMINOPHEN 650 MG RE SUPP
650.0000 mg | Freq: Four times a day (QID) | RECTAL | Status: DC | PRN
  Filled 2018-12-07: qty 1

## 2018-12-07 MED ORDER — FENTANYL CITRATE (PF) 250 MCG/5ML IJ SOLN
INTRAMUSCULAR | Status: DC | PRN
  Administered 2018-12-07 (×2): 50 ug via INTRAVENOUS

## 2018-12-07 SURGICAL SUPPLY — 44 items
BNDG COHESIVE 3X5 TAN STRL LF (GAUZE/BANDAGES/DRESSINGS) ×2 IMPLANT
BNDG CONFORM 2 STRL LF (GAUZE/BANDAGES/DRESSINGS) ×4 IMPLANT
BNDG ELASTIC 4X5.8 VLCR STR LF (GAUZE/BANDAGES/DRESSINGS) ×3 IMPLANT
BNDG GAUZE ELAST 4 BULKY (GAUZE/BANDAGES/DRESSINGS) ×5 IMPLANT
CORD BIPOLAR FORCEPS 12FT (ELECTRODE) ×3 IMPLANT
COVER SURGICAL LIGHT HANDLE (MISCELLANEOUS) ×3 IMPLANT
COVER WAND RF STERILE (DRAPES) ×3 IMPLANT
CUFF TOURN SGL QUICK 18X4 (TOURNIQUET CUFF) ×3 IMPLANT
CUFF TOURN SGL QUICK 24 (TOURNIQUET CUFF)
CUFF TRNQT CYL 24X4X16.5-23 (TOURNIQUET CUFF) IMPLANT
DRAIN PENROSE 1/4X12 LTX STRL (WOUND CARE) ×2 IMPLANT
DRSG ADAPTIC 3X8 NADH LF (GAUZE/BANDAGES/DRESSINGS) ×3 IMPLANT
GAUZE SPONGE 4X4 12PLY STRL (GAUZE/BANDAGES/DRESSINGS) ×3 IMPLANT
GAUZE XEROFORM 1X8 LF (GAUZE/BANDAGES/DRESSINGS) ×3 IMPLANT
GLOVE BIOGEL M 8.0 STRL (GLOVE) ×3 IMPLANT
GLOVE SS BIOGEL STRL SZ 8 (GLOVE) ×1 IMPLANT
GLOVE SUPERSENSE BIOGEL SZ 8 (GLOVE) ×2
GOWN STRL REUS W/ TWL LRG LVL3 (GOWN DISPOSABLE) ×1 IMPLANT
GOWN STRL REUS W/ TWL XL LVL3 (GOWN DISPOSABLE) ×2 IMPLANT
GOWN STRL REUS W/TWL LRG LVL3 (GOWN DISPOSABLE) ×3
GOWN STRL REUS W/TWL XL LVL3 (GOWN DISPOSABLE) ×6
KIT BASIN OR (CUSTOM PROCEDURE TRAY) ×3 IMPLANT
KIT TURNOVER KIT B (KITS) ×3 IMPLANT
MANIFOLD NEPTUNE II (INSTRUMENTS) ×3 IMPLANT
NDL HYPO 25GX1X1/2 BEV (NEEDLE) IMPLANT
NEEDLE HYPO 25GX1X1/2 BEV (NEEDLE) IMPLANT
NS IRRIG 1000ML POUR BTL (IV SOLUTION) ×3 IMPLANT
PACK ORTHO EXTREMITY (CUSTOM PROCEDURE TRAY) ×3 IMPLANT
PAD ARMBOARD 7.5X6 YLW CONV (MISCELLANEOUS) ×3 IMPLANT
PAD CAST 4YDX4 CTTN HI CHSV (CAST SUPPLIES) ×1 IMPLANT
PADDING CAST COTTON 4X4 STRL (CAST SUPPLIES) ×3
SET CYSTO W/LG BORE CLAMP LF (SET/KITS/TRAYS/PACK) ×3 IMPLANT
SOL PREP POV-IOD 4OZ 10% (MISCELLANEOUS) ×6 IMPLANT
SPONGE LAP 4X18 RFD (DISPOSABLE) ×3 IMPLANT
SUT CHROMIC 4 0 P 3 18 (SUTURE) ×2 IMPLANT
SUT PROLENE 4 0 PS 2 18 (SUTURE) ×4 IMPLANT
SWAB CULTURE ESWAB REG 1ML (MISCELLANEOUS) IMPLANT
SYR CONTROL 10ML LL (SYRINGE) IMPLANT
TOWEL GREEN STERILE (TOWEL DISPOSABLE) ×3 IMPLANT
TOWEL GREEN STERILE FF (TOWEL DISPOSABLE) ×3 IMPLANT
TUBE CONNECTING 12'X1/4 (SUCTIONS) ×1
TUBE CONNECTING 12X1/4 (SUCTIONS) ×2 IMPLANT
WATER STERILE IRR 1000ML POUR (IV SOLUTION) ×3 IMPLANT
YANKAUER SUCT BULB TIP NO VENT (SUCTIONS) ×3 IMPLANT

## 2018-12-07 NOTE — Op Note (Signed)
Operative note December 07, 2018  Roseanne Kaufman MD  Preoperative diagnosis: Left middle finger osteomyelitis with destruction of the distal and portions of the middle phalanx with active discharge and sinus tract formation as well as ascending cellulitic change  Postop diagnosis: The same  Operative procedure: #1 irrigation debridement of skin subtenons tissue bone tendon and associated soft tissue left middle finger this was an excisional debridement.  #2 amputation left middle finger with bilateral neurectomies at the middle phalanx level/P2  Surgeon: Roseanne Kaufman  Anesthesia local administered by myself in the form of a intermetacarpal block with IV sedation   Tourniquet time less than 30 minutes  Description of procedure: This patient is an 85 year old male with an infected osteomyelitic finger with excessive bone destruction.  I have counseled he and his family in regards to risk benefits of surgery.  He has had a prior surgical endeavor and left small finger with similar gout abnormality and has a fairly useless small finger he notes.  I discussed with him that given the deep bone infection and destruction on radiograph I would recommend and have to consider a amputation.  He understands and desires to proceed.  I did spoken with his son and the patient at great length  Patient was seen and taken to the operative theater he was given a scrub followed by a intermetacarpal block with lidocaine 1% without epinephrine performed by myself.  Following this the patient then underwent a Betadine scrub and paint followed by sterile field being secured once this was complete sterile field was secured and the patient then underwent a very careful and cautious approach to the extremity with an incision dorsally this was a fishmouth incision I performed irrigation and debridement of skin subtenons tissue tendon and bone and cultured the patient with aerobic and anaerobic cultures.  The patient  tolerated this well following this I then removed the middle phalanx at the middle phalanx/middle portion of P2 I remove the distal phalanx I then performed severance of the flexor digitorum profundus this was pulled distally and severed I then performed bilateral neurectomies.  Both the radial and ulnar digital nerves were treated with neurectomies and following this I performed 4 L of irrigation into the wound.  This in effect should be curative for the infection.  Following this we then irrigated copiously once again and perform closure of the wound loosely with Prolene suture of the 4 oh variety which he tolerated well.  I would recommend overnight stay possible discharge tomorrow on home p.o. antibiotics.  I discussed these issues with patient at length  He was awake alert and oriented and talkative throughout the operation I did beautifully.  It is unfortunate to lose the finger in terms of 50% of its length but there was no other option given the severe osteomyelitis and bony fracture present.  Patient understands this.  We will do everything in our power to give him a good functional result.  Lorrena Goranson MD

## 2018-12-07 NOTE — H&P (Signed)
The patient and I discussed all issues.  I also discussed the issues with the patient's son.  He is 85 years of age he has a horrible infection with osteomyelitis about his distal and proximal phalanx with fracture of the distal phalanx as a result of the osteomyelitic focus.  Unfortunately in this situation he does not have a viable finger and thus I would recommend primary amputation at the P2 level.  I discussed these issues with he and his family in a desire to proceed accordingly.  We are planning surgery for your upper extremity. The risk and benefits of surgery to include risk of bleeding, infection, anesthesia,  damage to normal structures and failure of the surgery to accomplish its intended goals of relieving symptoms and restoring function have been discussed in detail. With this in mind we plan to proceed. I have specifically discussed with the patient the pre-and postoperative regime and the dos and don'ts and risk and benefits in great detail. Risk and benefits of surgery also include risk of dystrophy(CRPS), chronic nerve pain, failure of the healing process to go onto completion and other inherent risks of surgery The relavent the pathophysiology of the disease/injury process, as well as the alternatives for treatment and postoperative course of action has been discussed in great detail with the patient who desires to proceed.  We will do everything in our power to help you (the patient) restore function to the upper extremity. It is a pleasure to see this patient today.   We will move forward with left middle finger repair reconstruction with amputation of nonviable tissue as necessary.  Roseanne Kaufman MD

## 2018-12-07 NOTE — Anesthesia Postprocedure Evaluation (Signed)
Anesthesia Post Note  Patient: Angel Cooper  Procedure(s) Performed: IRRIGATION AND DEBRIDEMENT EXTREMITY (Left ) Amputation Left Long Finger (Left )     Patient location during evaluation: PACU Anesthesia Type: MAC Level of consciousness: awake and alert Pain management: pain level controlled Vital Signs Assessment: post-procedure vital signs reviewed and stable Respiratory status: spontaneous breathing and respiratory function stable Cardiovascular status: stable Postop Assessment: no apparent nausea or vomiting Anesthetic complications: no    Last Vitals:  Vitals:   12/07/18 1845 12/07/18 1900  BP: (!) 138/91 137/61  Pulse: 75 82  Resp: 19 16  Temp:  36.5 C  SpO2: 98% 97%    Last Pain:  Vitals:   12/07/18 1908  TempSrc:   PainSc: [name]

## 2018-12-07 NOTE — H&P (Signed)
History and Physical    Angel Cooper [id number] DOB: Aug 08, 1930 DOA: 12/07/2018  Referring MD/NP/PA: Benedetto Goad, PA-C PCP: [name], MD  Patient coming from: Home  Chief Complaint: Infection of finger  I have personally briefly reviewed patient's old medical records in [place]   HPI: Angel Cooper is a 85 y.o. male with medical history significant of HTN, HLD, CAD s/p CABG, PAF, CHF, gout, and bladder cancer.  He presents after being asked to come in by his primary care provider for infection of the third digit of his left hand.  Patient reports that he has had swelling and yellow like stuck under his fingernail for quite some time.  He reports though over the last few weeks that a hole developed and started draining yellow fluid.  He notes that the finger has been sore.  He followed up at the clinic on Sunday and was started on doxycycline for his symptoms.  They also took swabs and sent them for culture.  He previously had similar thing happen to his little finger 2 years ago that required I&D in the outpatient setting by Dr. [name].  Denies having any significant fever, chills, nausea, vomiting, shortness of breath, cough, or diarrhea.  ED Course: Upon admission into the emergency department patient was noted to have stable vital signs.  Labs significant for WBC 10.6, hemoglobin 11.9, BUN 19, and creatinine 1.46.  X-rays revealed lytic destruction of the bone of the third digit of the left hand concerning for osteomyelitis.  Orthopedics was consulted plan on taking the patient for surgery this afternoon.  TRH called to admit.  Cultures to be taken following surgery.  Review of systems: A complete 10 point review systems was performed and negative except for as noted above in HPI  Past Medical History:  Diagnosis Date  . Atrial fibrillation ([place])    2015  W/ RVR  . Bladder tumor   . CHF (congestive heart failure) ([place]) 2015  . CKD (chronic kidney disease), stage  III ([place])   . Coronary artery disease    NO LONGER SEES CARDIOLOGIST - LAST SAW DR. [name] 2 OR MORE YRS AGO-PT SEES PCP DR [name]  . GERD (gastroesophageal reflux disease)   . Hiatal hernia   . History of colon polyps   . History of gastric ulcer    1989  peptic and duodenal  . History of MI (myocardial infarction)    1995  . History of non-ST elevation myocardial infarction (NSTEMI)    07-05-1999  s/p cabg  . Hyperlipidemia   . Hypertension   . OA (osteoarthritis)   . S/P CABG (coronary artery bypass graft)    06/ 2001  . Ureteral carcinoma, right [facility]) urologist-  dr herrick/  oncologist- dr [name]   dx 2014  s/p  right nephroureterectomy 01-24-2013 (T3, N0) invasive high grade urothelial carcinoma--     Past Surgical History:  Procedure Laterality Date  . CARDIAC CATHETERIZATION  07/05/1999   non-q MI;  3vessel CAD w/ high grade pLAD and mLAD, subtotal ramus branch,  OM1 70-80%,  ostialCFX 50-60%,  dCFx 80%,  RI 99%,  total occluded pRCA and fills last by right-to-right collaterals,  pLM  20% narrowing;  severe inferoapical hypokinesis  . CATARACT EXTRACTION W/ INTRAOCULAR LENS  IMPLANT, BILATERAL    . CORONARY ARTERY BYPASS GRAFT  06/ 2001  dr [name] tright  . CYSTOSCOPY W/ RETROGRADES Left 01/31/2016   Procedure: CYSTOSCOPY WITH RETROGRADE PYELOGRAM;  Surgeon:  [name], MD;  Location: [facility];  Service: Urology;  Laterality: Left;  . CYSTOSCOPY WITH RETROGRADE PYELOGRAM, URETEROSCOPY AND STENT PLACEMENT Right 12/03/2012   Procedure: CYSTOSCOPY WITH RIGHT RETROGRADE PYELOGRAM,RIGHT URETERAL WASHINGS, RIGHT URETERAL BIOPSY, RIGHT URETEROSCOPY AND STENT PLACEMENT;  Surgeon: [name], MD;  Location: [facility];  Service: Urology;  Laterality: Right;  . CYSTOSCOPY WITH RETROGRADE PYELOGRAM, URETEROSCOPY AND STENT PLACEMENT Right 12/15/2012   Procedure: CYSTOSCOPY WITH RIGHT RETROGRADE PYELOGRAM, POSSIBLE RIGHT URETEROSCOPY AND STENT PLACEMENT AND  REMOVAL OF LEFT STENT;  Surgeon: [name], MD;  Location: [facility];  Service: Urology;  Laterality: Right;  . CYSTOSCOPY WITH STENT PLACEMENT Right 01/24/2013   Procedure: CYSTOSCOPY WITH STENT PLACEMENT;  Surgeon: [name], MD;  Location: [facility];  Service: Urology;  Laterality: Right;  . INCISION AND DRAINAGE ABSCESS Left 06/02/2016   Procedure: INCISION AND DRAINAGE LEFT SMALL FINGER;  Surgeon: [name], MD;  Location: [place];  Service: Orthopedics;  Laterality: Left;  . ROBOT ASSITED LAPAROSCOPIC NEPHROURETERECTOMY Right 01/24/2013   Procedure: ROBOT ASSISTED LAPAROSCOPIC NEPHROURETERECTOMY WITH BILATERAL LYMPH NODE DISSECTION ;  Surgeon: [name], MD;  Location: [facility];  Service: Urology;  Laterality: Right;  right kidney  . TRANSTHORACIC ECHOCARDIOGRAM  01/28/2013   mild LVH,  ef 45-50%, diffuse hypokinesis,  grade 1 diastolic dysfunction/  moderate LAE/  trivial PR/  mild TR, PASP 50-86mmHg  . TRANSURETHRAL RESECTION OF BLADDER TUMOR N/A 01/31/2016   Procedure: TRANSURETHRAL RESECTION OF BLADDER TUMOR (TURBT);  Surgeon: [name], MD;  Location: [facility];  Service: Urology;  Laterality: N/A;     reports that he quit smoking about 60 years ago. He has never used smokeless tobacco. He reports current alcohol use. He reports that he does not use drugs.  Allergies  Allergen Reactions  . Aspirin Other (See Comments)    Stomach pain   . Benadryl [Diphenhydramine Hcl (Sleep)] Hives  . Chlorhexidine Other (See Comments)    CAUSED SKIN IRRITATION AND BURNING SENSATION  . Enalapril Other (See Comments)    No family history on file.  Prior to Admission medications   Medication Sig Start Date End Date Taking? Authorizing Provider  acetaminophen (TYLENOL) 500 MG tablet Take 1,000 mg by mouth every 6 (six) hours as needed for mild pain or headache.     [provider]  amLODipine (NORVASC) 5 MG tablet Take 5 mg by  mouth every morning.    [provider]  CYANOCOBALAMIN IJ Inject as directed every 30 (thirty) days.    [provider]  Famotidine (PEPCID PO) Take 1 tablet by mouth daily.    [provider]  furosemide (LASIX) 40 MG tablet Take 80 mg by mouth every morning.     [provider]  HYDROcodone-acetaminophen (NORCO/VICODIN) 5-325 MG tablet Take 1 tablet by mouth every 4 (four) hours as needed. 04/17/18   Isla Pence, MD  oxyCODONE-acetaminophen (PERCOCET/ROXICET) 5-325 MG tablet Take 1-2 tablets by mouth every 6 (six) hours as needed for severe pain. Patient not taking: Reported on 04/17/2018 09/26/16   Davonna Belling, MD  phenazopyridine (PYRIDIUM) 200 MG tablet Take 1 tablet (200 mg total) by mouth 3 (three) times daily as needed for pain. Patient not taking: Reported on 04/17/2018 01/31/16   [name], MD  predniSONE (DELTASONE) 20 MG tablet Take 2 tablets (40 mg total) by mouth daily. Patient not taking: Reported on 04/17/2018 09/26/16   Davonna Belling,  MD  psyllium (METAMUCIL) 58.6 % powder Take 1 packet by mouth daily as needed (constipation).    [provider]  sulfamethoxazole-trimethoprim (BACTRIM DS,SEPTRA DS) 800-160 MG tablet Take 1 tablet by mouth 2 (two) times daily. Patient not taking: Reported on 04/17/2018 06/02/16   [name], MD  traMADol (ULTRAM) 50 MG tablet Take 1-2 tablets (50-100 mg total) by mouth every 6 (six) hours as needed. Patient taking differently: Take 50-100 mg by mouth every 6 (six) hours as needed for moderate pain.  01/31/16   [name], MD    Physical Exam:  Constitutional: Elderly male currently in NAD, calm, comfortable Vitals:   12/07/18 1318  BP: (!) 137/59  Pulse: 81  Resp: 16  Temp: 97.8 F (36.6 C)  TempSrc: Oral  SpO2: 99%   Eyes: PERRL, lids and conjunctivae normal ENMT: Mucous membranes are moist. Posterior pharynx clear of any exudate or lesions.  Neck: normal, supple, no  masses, no thyromegaly Respiratory: clear to auscultation bilaterally, no wheezing, no crackles. Normal respiratory effort. No accessory muscle use.  Cardiovascular: Regular rate and rhythm, no murmurs / rubs / gallops. No extremity edema. 2+ pedal pulses. No carotid bruits.  Abdomen: no tenderness, no masses palpated. No hepatosplenomegaly. Bowel sounds positive.  Musculoskeletal: no clubbing / cyanosis.  Deformity of the left fifth finger and third finger. Skin: Redness and erythema with yellowish exudate draining from the wound of the left third digit.    Neurologic: CN 2-12 grossly intact. Sensation intact, DTR normal. Strength 5/5 in all 4.  Psychiatric: Normal judgment and insight. Alert and oriented x 3. Normal mood.     Labs on Admission: I have personally reviewed following labs and imaging studies  CBC: Recent Labs  Lab 12/07/18 1324  WBC 10.6*  NEUTROABS 7.7  HGB 11.9*  HCT 36.0*  MCV 91.8  PLT [id number]   Basic Metabolic Panel: No results for input(s): NA, K, CL, CO2, GLUCOSE, BUN, CREATININE, CALCIUM, MG, PHOS in the last 168 hours. GFR: CrCl cannot be calculated (Patient's most recent lab result is older than the maximum 21 days allowed.). Liver Function Tests: No results for input(s): AST, ALT, ALKPHOS, BILITOT, PROT, ALBUMIN in the last 168 hours. No results for input(s): LIPASE, AMYLASE in the last 168 hours. No results for input(s): AMMONIA in the last 168 hours. Coagulation Profile: No results for input(s): INR, PROTIME in the last 168 hours. Cardiac Enzymes: No results for input(s): CKTOTAL, CKMB, CKMBINDEX, TROPONINI in the last 168 hours. BNP (last 3 results) No results for input(s): PROBNP in the last 8760 hours. HbA1C: No results for input(s): HGBA1C in the last 72 hours. CBG: No results for input(s): GLUCAP in the last 168 hours. Lipid Profile: No results for input(s): CHOL, HDL, LDLCALC, TRIG, CHOLHDL, LDLDIRECT in the last 72 hours. Thyroid Function  Tests: No results for input(s): TSH, T4TOTAL, FREET4, T3FREE, THYROIDAB in the last 72 hours. Anemia Panel: No results for input(s): VITAMINB12, FOLATE, FERRITIN, TIBC, IRON, RETICCTPCT in the last 72 hours. Urine analysis:    Component Value Date/Time   COLORURINE STRAW (A) 04/17/2018 1609   APPEARANCEUR CLEAR 04/17/2018 1609   LABSPEC 1.005 04/17/2018 1609   PHURINE 6.0 04/17/2018 1609   GLUCOSEU NEGATIVE 04/17/2018 1609   HGBUR NEGATIVE 04/17/2018 1609   BILIRUBINUR NEGATIVE 04/17/2018 [place] 04/17/2018 1609   PROTEINUR NEGATIVE 04/17/2018 1609   UROBILINOGEN 0.2 01/28/2013 1204   NITRITE NEGATIVE 04/17/2018 [place] 04/17/2018 1609  Sepsis Labs: No results found for this or any previous visit (from the past 240 hour(s)).   Radiological Exams on Admission: Dg Finger Middle Left  Result Date: 12/07/2018 CLINICAL DATA:  Left middle finger cellulitis. EXAM: LEFT MIDDLE FINGER 2+V COMPARISON:  None. FINDINGS: Soft tissue swelling of the distal left middle finger is noted. There appears to be lytic destruction involving a large portion of the third distal phalanx most likely due to osteomyelitis, which results in pathologic fracture of the third distal phalanx. Lytic destruction of the distal portion of the third middle phalanx is noted also consistent with osteomyelitis. No foreign body is noted. IMPRESSION: Lytic destruction is seen involving the third middle and distal phalanges consistent with osteomyelitis. This results in pathologic fracture of the third distal phalanx. Electronically Signed   By: Marijo Conception M.D.   On: 12/07/2018 14:25    EKG: Independently reviewed.  Sinus rhythm 82 bpm with PVCs  Assessment/Plan Septic gouty arthritis, cellulitis, osteomyelitis: Patient presents with pain and swelling of the third digit of the left hand.  X-rays reveal lytic destruction consistent with osteomyelitis. Orthopedics consulted with plans  for I&D and possible need of amputation.  Patient reports having similar symptoms like this previously in the past with the little finger of his left hand. -Admit to a MedSurg bed -N.p.o. for surgery -Follow-up cultures from patient's primary care office and those to be obtained with I&D -Follow-up uric acid level -Recheck CBC in a.m. -Antibiotics to start following procedure -Appreciate orthopedic consultative services we will follow for further recommendations.  Leukocytosis: WBC elevated 10.6.  Suspect related to the above. -Recheck CBC in a.m.  Essential hypertension: Blood pressures currently stable. -Continue amlodipine and furosemide  History of atrial fibrillation: Patient not on any anticoagulation.  It appears currently in sinus rhythm. -Continue to monitor  Anemia of chronic disease: Patient's blood count appears just slightly lower than his baseline and had previously been around 12-13. -Continue to monitor  Chronic kidney disease stage III: Stable. -Continue to monitor  CAD s/p bypass graft  History of bladder cancer s/p resection  GERD -Continue pharmacy substitution for omeprazole  DVT prophylaxis: lovenox Code Status: Full Family Communication: This plan of care with the patient and his son present at bedside Disposition Plan: To be determined Consults called: Orthopedics Admission status: Observation  Norval Morton MD [facility] Pager [phone]   If 7PM-7AM, please contact night-coverage [email or web address] Password [email or web address]  12/07/2018, 2:23 PM

## 2018-12-07 NOTE — Consult Note (Signed)
Reason for Consult:Left long finger gout Referring Physician: Yesenia Cooper is an 85 y.o. male.  HPI: Angel Cooper had seen his PCP this morning and was noted to have an infection in his left long finger. He notes it's been sore but not exquisitely TTP. It's been going on a while. He has a hx/o gout and had a similar e/o on the little finger of the same hand about 2y ago that required I&D, that time by Dr. [name]. He is RHD.  Past Medical History:  Diagnosis Date  . Atrial fibrillation ([place])    2015  W/ RVR  . Bladder tumor   . CHF (congestive heart failure) ([place]) 2015  . CKD (chronic kidney disease), stage III ([place])   . Coronary artery disease    NO LONGER SEES CARDIOLOGIST - LAST SAW DR. [name] 2 OR MORE YRS AGO-PT SEES PCP DR [name]  . GERD (gastroesophageal reflux disease)   . Hiatal hernia   . History of colon polyps   . History of gastric ulcer    1989  peptic and duodenal  . History of MI (myocardial infarction)    1995  . History of non-ST elevation myocardial infarction (NSTEMI)    07-05-1999  s/p cabg  . Hyperlipidemia   . Hypertension   . OA (osteoarthritis)   . S/P CABG (coronary artery bypass graft)    06/ 2001  . Ureteral carcinoma, right [facility]) urologist-  dr herrick/  oncologist- dr [name]   dx 2014  s/p  right nephroureterectomy 01-24-2013 (T3, N0) invasive high grade urothelial carcinoma--     Past Surgical History:  Procedure Laterality Date  . CARDIAC CATHETERIZATION  07/05/1999   non-q MI;  3vessel CAD w/ high grade pLAD and mLAD, subtotal ramus branch,  OM1 70-80%,  ostialCFX 50-60%,  dCFx 80%,  RI 99%,  total occluded pRCA and fills last by right-to-right collaterals,  pLM  20% narrowing;  severe inferoapical hypokinesis  . CATARACT EXTRACTION W/ INTRAOCULAR LENS  IMPLANT, BILATERAL    . CORONARY ARTERY BYPASS GRAFT  06/ 2001  dr [name] tright  . CYSTOSCOPY W/ RETROGRADES Left 01/31/2016   Procedure: CYSTOSCOPY WITH RETROGRADE PYELOGRAM;   Surgeon: [name], MD;  Location: [facility];  Service: Urology;  Laterality: Left;  . CYSTOSCOPY WITH RETROGRADE PYELOGRAM, URETEROSCOPY AND STENT PLACEMENT Right 12/03/2012   Procedure: CYSTOSCOPY WITH RIGHT RETROGRADE PYELOGRAM,RIGHT URETERAL WASHINGS, RIGHT URETERAL BIOPSY, RIGHT URETEROSCOPY AND STENT PLACEMENT;  Surgeon: [name], MD;  [facility];  Service: Urology;  Laterality: Right;  . CYSTOSCOPY WITH RETROGRADE PYELOGRAM, URETEROSCOPY AND STENT PLACEMENT Right 12/15/2012   Procedure: CYSTOSCOPY WITH RIGHT RETROGRADE PYELOGRAM, POSSIBLE RIGHT URETEROSCOPY AND STENT PLACEMENT AND REMOVAL OF LEFT STENT;  Surgeon: [name], MD;  [facility];  Service: Urology;  Laterality: Right;  . CYSTOSCOPY WITH STENT PLACEMENT Right 01/24/2013   Procedure: CYSTOSCOPY WITH STENT PLACEMENT;  Surgeon: [name], MD;  [facility];  Service: Urology;  Laterality: Right;  . INCISION AND DRAINAGE ABSCESS Left 06/02/2016   Procedure: INCISION AND DRAINAGE LEFT SMALL FINGER;  Surgeon: [name], MD;  Location: [place];  Service: Orthopedics;  Laterality: Left;  . ROBOT ASSITED LAPAROSCOPIC NEPHROURETERECTOMY Right 01/24/2013   Procedure: ROBOT ASSISTED LAPAROSCOPIC NEPHROURETERECTOMY WITH BILATERAL LYMPH NODE DISSECTION ;  Surgeon: [name], MD;  [facility];  Service: Urology;  Laterality: Right;  right kidney  . TRANSTHORACIC ECHOCARDIOGRAM  01/28/2013  mild LVH,  ef 45-50%, diffuse hypokinesis,  grade 1 diastolic dysfunction/  moderate LAE/  trivial PR/  mild TR, PASP 50-5mmHg  . TRANSURETHRAL RESECTION OF BLADDER TUMOR N/A 01/31/2016   Procedure: TRANSURETHRAL RESECTION OF BLADDER TUMOR (TURBT);  Surgeon: [name], MD;  Location: [facility];  Service: Urology;  Laterality: N/A;    No family history on file.  Social History:  reports that he quit smoking about 60 years ago. He has  never used smokeless tobacco. He reports current alcohol use. He reports that he does not use drugs.  Allergies:  Allergies  Allergen Reactions  . Aspirin Other (See Comments)    Stomach pain   . Benadryl [Diphenhydramine Hcl (Sleep)] Hives  . Chlorhexidine Other (See Comments)    CAUSED SKIN IRRITATION AND BURNING SENSATION  . Enalapril Other (See Comments)    Medications: I have reviewed the patient's current medications.  Results for orders placed or performed during the hospital encounter of 12/07/18 (from the past 48 hour(s))  CBC with Differential     Status: Abnormal   Collection Time: 12/07/18  1:24 PM  Result Value Ref Range   WBC 10.6 (H) 4.0 - 10.5 K/uL   RBC 3.92 (L) 4.22 - 5.81 MIL/uL   Hemoglobin 11.9 (L) 13.0 - 17.0 g/dL   HCT 36.0 (L) 39.0 - 52.0 %   MCV 91.8 80.0 - 100.0 fL   MCH 30.4 26.0 - 34.0 pg   MCHC 33.1 30.0 - 36.0 g/dL   RDW 13.9 11.5 - 15.5 %   Platelets 327 150 - 400 K/uL   nRBC 0.0 0.0 - 0.2 %   Neutrophils Relative % 74 %   Neutro Abs 7.7 1.7 - 7.7 K/uL   Lymphocytes Relative 12 %   Lymphs Abs 1.3 0.7 - 4.0 K/uL   Monocytes Relative 7 %   Monocytes Absolute 0.8 0.1 - 1.0 K/uL   Eosinophils Relative 6 %   Eosinophils Absolute 0.7 (H) 0.0 - 0.5 K/uL   Basophils Relative 1 %   Basophils Absolute 0.1 0.0 - 0.1 K/uL   Immature Granulocytes 0 %   Abs Immature Granulocytes 0.04 0.00 - 0.07 K/uL    Comment: Performed at [facility], [address] [place], [place] [address]    No results found.  Review of Systems  Constitutional: Negative for weight loss.  HENT: Negative for ear discharge, ear pain, hearing loss and tinnitus.   Eyes: Negative for blurred vision, double vision, photophobia and pain.  Respiratory: Negative for cough, sputum production and shortness of breath.   Cardiovascular: Negative for chest pain.  Gastrointestinal: Negative for abdominal pain, nausea and vomiting.  Genitourinary: Negative for dysuria, flank pain,  frequency and urgency.  Musculoskeletal: Positive for joint pain (Left long finger). Negative for back pain, falls, myalgias and neck pain.  Neurological: Negative for dizziness, tingling, sensory change, focal weakness, loss of consciousness and headaches.  Endo/Heme/Allergies: Does not bruise/bleed easily.  Psychiatric/Behavioral: Negative for depression, memory loss and substance abuse. The patient is not nervous/anxious.    Blood pressure (!) 137/59, pulse 81, temperature 97.8 F (36.6 C), temperature source Oral, resp. rate 16, SpO2 99 %. Physical Exam  Constitutional: He appears well-developed and well-nourished. No distress.  HENT:  Head: Normocephalic and atraumatic.  Eyes: Conjunctivae are normal. Right eye exhibits no discharge. Left eye exhibits no discharge. No scleral icterus.  Neck: Normal range of motion.  Cardiovascular: Normal rate and regular rhythm.  Respiratory: Effort  normal. No respiratory distress.  Musculoskeletal:     Comments: Left shoulder, elbow, wrist, digits- Wound over dorsum of DIP joint left long finger with tophaceous exudate, mild-to-mod TTP, no instability, no blocks to motion  Sens  Ax/R/M/U intact  Mot   Ax/ R/ PIN/ M/ AIN/ U intact  Rad 2+  Neurological: He is alert.  Skin: Skin is warm and dry. He is not diaphoretic.  Psychiatric: He has a normal mood and affect. His behavior is normal.    Assessment/Plan: Left long finger septic gouty arthritis of the DIP joint -- Plan for I&D by Dr. Amedeo Plenty this evening. Will obtain cultures in OR. Will ask IM to admit and treat systemically for gout. Obtain uric acid level and x-ray. Multiple medical problems including afib (not on anticoagulation), hx/o CAD, CKD, and ureteral ca -- per primary service    Lisette Abu, PA-C Orthopedic Surgery [phone] 12/07/2018, 2:12 PM

## 2018-12-07 NOTE — Anesthesia Preprocedure Evaluation (Signed)
Anesthesia Evaluation  Patient identified by MRN, date of birth, ID band Patient awake    Reviewed: Allergy & Precautions, NPO status , Patient's Chart, lab work & pertinent test results  Airway Mallampati: III  TM Distance: >3 FB Neck ROM: Full    Dental no notable dental hx.    Pulmonary former smoker,    Pulmonary exam normal breath sounds clear to auscultation       Cardiovascular hypertension, Pt. on medications + CAD, + CABG and +CHF  Normal cardiovascular exam+ dysrhythmias Atrial Fibrillation  Rhythm:Regular Rate:Normal  ECG: rate 82. Sinus rhythm with frequent Premature ventricular complexes   Neuro/Psych negative neurological ROS  negative psych ROS   GI/Hepatic Neg liver ROS, hiatal hernia, GERD  Medicated and Controlled,  Endo/Other  negative endocrine ROS  Renal/GU Renal disease     Musculoskeletal Gout   Abdominal   Peds  Hematology  (+) anemia ,   Anesthesia Other Findings Left long finger septic arthritis  Reproductive/Obstetrics                             Anesthesia Physical Anesthesia Plan  ASA: III and emergent  Anesthesia Plan: MAC   Post-op Pain Management:    Induction: Intravenous  PONV Risk Score and Plan: 1 and Ondansetron, Dexamethasone, Treatment may vary due to age or medical condition and Propofol infusion  Airway Management Planned: Natural Airway  Additional Equipment:   Intra-op Plan:   Post-operative Plan:   Informed Consent: I have reviewed the patients History and Physical, chart, labs and discussed the procedure including the risks, benefits and alternatives for the proposed anesthesia with the patient or authorized representative who has indicated his/her understanding and acceptance.     Dental advisory given  Plan Discussed with: CRNA  Anesthesia Plan Comments:         Anesthesia Quick Evaluation

## 2018-12-07 NOTE — ED Triage Notes (Addendum)
Pt PCP sent him here to see Angel Cooper ortho PA for cellulitis to Left middle finger. Reports swelling to knuckle x3 months, began draining 2 months ago.

## 2018-12-07 NOTE — ED Provider Notes (Signed)
[facility] EMERGENCY DEPARTMENT Provider Note   CSN: [id number] Arrival date & time: 12/07/18  1249     History   Chief Complaint Chief Complaint  Patient presents with   Cellulitis    HPI TRYGVE SHIVELY is a 85 y.o. male.     AVISHAI KAPLER is a 85 y.o. male with a history of A. fib, CAD, CKD, CAD, GERD, hypertension, hyperlipidemia, MI, who presents to the ED from PCPs office for evaluation of infection to the left middle finger.  PCP contacted Orion Crook with orthopedics prior to patient's arrival.  Patient has had redness pain and swelling of the left middle finger for the past 3 months and about 2 months ago it began having purulent drainage which has increased recently.  Patient reports he went to an urgent care and was started on doxycycline over the weekend, went to his PCP today and was sent to the hospital with concern that patient will likely need surgical washout or partial amputation.  Patient denies any fevers.  Reports the finger is minimally painful.  He has not noticed any red streaking towards the hand or swelling in the hand.  This is thought to be an infected gouty arthritis.  Denies history of the same. No other aggravating to alleviating factors.      Past Medical History:  Diagnosis Date   Atrial fibrillation ([place])    2015  W/ RVR   Bladder tumor    CHF (congestive heart failure) ([place]) 2015   CKD (chronic kidney disease), stage III (HCC)    Coronary artery disease    NO LONGER SEES CARDIOLOGIST - LAST SAW DR. [name] 2 OR MORE YRS AGO-PT SEES PCP DR [name]   GERD (gastroesophageal reflux disease)    Hiatal hernia    History of colon polyps    History of gastric ulcer    1989  peptic and duodenal   History of MI (myocardial infarction)    1995   History of non-ST elevation myocardial infarction (NSTEMI)    07-05-1999  s/p cabg   Hyperlipidemia    Hypertension    OA (osteoarthritis)    S/P CABG  (coronary artery bypass graft)    06/ 2001   Ureteral carcinoma, right [facility]) urologist-  dr herrick/  oncologist- dr [name]   dx 2014  s/p  right nephroureterectomy 01-24-2013 (T3, N0) invasive high grade urothelial carcinoma--     Patient Active Problem List   Diagnosis Date Noted   Atrial fibrillation with RVR ([place]) 01/28/2013   CAD (coronary artery disease) of artery bypass graft 01/28/2013   CKD (chronic kidney disease) stage 3, GFR 30-59 ml/min 01/28/2013   Ureteral cancer ([place]) 01/24/2013    Past Surgical History:  Procedure Laterality Date   CARDIAC CATHETERIZATION  07/05/1999   non-q MI;  3vessel CAD w/ high grade pLAD and mLAD, subtotal ramus branch,  OM1 70-80%,  ostialCFX 50-60%,  dCFx 80%,  RI 99%,  total occluded pRCA and fills last by right-to-right collaterals,  pLM  20% narrowing;  severe inferoapical hypokinesis   CATARACT EXTRACTION W/ INTRAOCULAR LENS  IMPLANT, BILATERAL     CORONARY ARTERY BYPASS GRAFT  06/ 2001  dr [name] tright   CYSTOSCOPY W/ RETROGRADES Left 01/31/2016   Procedure: CYSTOSCOPY WITH RETROGRADE PYELOGRAM;  Surgeon: [name], MD;  Location: [facility];  Service: Urology;  Laterality: Left;   CYSTOSCOPY WITH RETROGRADE PYELOGRAM, URETEROSCOPY AND STENT PLACEMENT Right 12/03/2012  Procedure: CYSTOSCOPY WITH RIGHT RETROGRADE PYELOGRAM,RIGHT URETERAL WASHINGS, RIGHT URETERAL BIOPSY, RIGHT URETEROSCOPY AND STENT PLACEMENT;  Surgeon: [name], MD;  Location: [facility];  Service: Urology;  Laterality: Right;   CYSTOSCOPY WITH RETROGRADE PYELOGRAM, URETEROSCOPY AND STENT PLACEMENT Right 12/15/2012   Procedure: CYSTOSCOPY WITH RIGHT RETROGRADE PYELOGRAM, POSSIBLE RIGHT URETEROSCOPY AND STENT PLACEMENT AND REMOVAL OF LEFT STENT;  Surgeon: [name], MD;  Location: [facility];  Service: Urology;  Laterality: Right;   CYSTOSCOPY WITH STENT PLACEMENT Right 01/24/2013   Procedure: CYSTOSCOPY WITH STENT PLACEMENT;  Surgeon:  [name], MD;  Location: [facility];  Service: Urology;  Laterality: Right;   INCISION AND DRAINAGE ABSCESS Left 06/02/2016   Procedure: INCISION AND DRAINAGE LEFT SMALL FINGER;  Surgeon: [name], MD;  Location: [facility];  Service: Orthopedics;  Laterality: Left;   ROBOT ASSITED LAPAROSCOPIC NEPHROURETERECTOMY Right 01/24/2013   Procedure: ROBOT ASSISTED LAPAROSCOPIC NEPHROURETERECTOMY WITH BILATERAL LYMPH NODE DISSECTION ;  Surgeon: [name], MD;  Location: [facility];  Service: Urology;  Laterality: Right;  right kidney   TRANSTHORACIC ECHOCARDIOGRAM  01/28/2013   mild LVH,  ef 45-50%, diffuse hypokinesis,  grade 1 diastolic dysfunction/  moderate LAE/  trivial PR/  mild TR, PASP 50-5mmHg   TRANSURETHRAL RESECTION OF BLADDER TUMOR N/A 01/31/2016   Procedure: TRANSURETHRAL RESECTION OF BLADDER TUMOR (TURBT);  Surgeon: [name], MD;  Location: [facility];  Service: Urology;  Laterality: N/A;        Home Medications    Prior to Admission medications   Medication Sig Start Date End Date Taking? Authorizing Provider  acetaminophen (TYLENOL) 500 MG tablet Take 1,000 mg by mouth every 6 (six) hours as needed for mild pain or headache.     [provider]  amLODipine (NORVASC) 5 MG tablet Take 5 mg by mouth every morning.    [provider]  CYANOCOBALAMIN IJ Inject as directed every 30 (thirty) days.    [provider]  Famotidine (PEPCID PO) Take 1 tablet by mouth daily.    [provider]  furosemide (LASIX) 40 MG tablet Take 80 mg by mouth every morning.     [provider]  HYDROcodone-acetaminophen (NORCO/VICODIN) 5-325 MG tablet Take 1 tablet by mouth every 4 (four) hours as needed. 04/17/18   Isla Pence, MD  oxyCODONE-acetaminophen (PERCOCET/ROXICET) 5-325 MG tablet Take 1-2 tablets by mouth every 6 (six) hours as needed for severe pain. Patient not taking: Reported on 04/17/2018  09/26/16   Davonna Belling, MD  phenazopyridine (PYRIDIUM) 200 MG tablet Take 1 tablet (200 mg total) by mouth 3 (three) times daily as needed for pain. Patient not taking: Reported on 04/17/2018 01/31/16   [name], MD  predniSONE (DELTASONE) 20 MG tablet Take 2 tablets (40 mg total) by mouth daily. Patient not taking: Reported on 04/17/2018 09/26/16   Davonna Belling, MD  psyllium (METAMUCIL) 58.6 % powder Take 1 packet by mouth daily as needed (constipation).    [provider]  sulfamethoxazole-trimethoprim (BACTRIM DS,SEPTRA DS) 800-160 MG tablet Take 1 tablet by mouth 2 (two) times daily. Patient not taking: Reported on 04/17/2018 06/02/16   [name], MD  traMADol (ULTRAM) 50 MG tablet Take 1-2 tablets (50-100 mg total) by mouth every 6 (six) hours as needed. Patient taking differently: Take 50-100 mg by mouth every 6 (six) hours as needed for moderate pain.  01/31/16   [name], MD    Family History No family history  on file.  Social History Social History   Tobacco Use   Smoking status: Former Smoker    Quit date: 01/27/1958    Years since quitting: 60.9   Smokeless tobacco: Never Used  Substance Use Topics   Alcohol use: Yes    Comment: OCCASIONAL   Drug use: No     Allergies   Aspirin, Benadryl [diphenhydramine hcl (sleep)], Chlorhexidine, and Enalapril   Review of Systems Review of Systems  Constitutional: Negative for chills and fever.  Respiratory: Negative for cough and shortness of breath.   Cardiovascular: Negative for chest pain.  Gastrointestinal: Negative for nausea and vomiting.  Musculoskeletal: Positive for arthralgias and joint swelling.  Skin: Positive for color change and wound.  Neurological: Negative for weakness and numbness.  All other systems reviewed and are negative.    Physical Exam Updated Vital Signs BP (!) 137/59 (BP Location: Right Arm)    Pulse 81    Temp 97.8 F (36.6 C) (Oral)    Resp 16    SpO2 99%    Physical Exam Vitals signs and nursing note reviewed.  Constitutional:      General: He is not in acute distress.    Appearance: He is well-developed and normal weight. He is not ill-appearing or diaphoretic.  HENT:     Head: Normocephalic and atraumatic.  Eyes:     General:        Right eye: No discharge.        Left eye: No discharge.     Pupils: Pupils are equal, round, and reactive to light.  Neck:     Musculoskeletal: Neck supple.  Cardiovascular:     Rate and Rhythm: Normal rate and regular rhythm.     Heart sounds: Normal heart sounds.  Pulmonary:     Effort: Pulmonary effort is normal. No respiratory distress.     Breath sounds: Normal breath sounds. No wheezing or rales.  Abdominal:     General: Bowel sounds are normal. There is no distension.     Palpations: Abdomen is soft. There is no mass.     Tenderness: There is no abdominal tenderness. There is no guarding.  Musculoskeletal:     Comments: Left middle finger with significant erythema and swelling on the distal phalanx with purulent drainage just proximal to the base of the nail, see photo below  Skin:    General: Skin is warm and dry.     Capillary Refill: Capillary refill takes less than 2 seconds.  Neurological:     Mental Status: He is alert.     Coordination: Coordination normal.     Comments: Speech is clear, able to follow commands Moves extremities without ataxia, coordination intact  Psychiatric:        Mood and Affect: Mood normal.        Behavior: Behavior normal.        ED Treatments / Results  Labs (all labs ordered are listed, but only abnormal results are displayed) Labs Reviewed  CBC WITH DIFFERENTIAL/PLATELET - Abnormal; Notable for the following components:      Result Value   WBC 10.6 (*)    RBC 3.92 (*)    Hemoglobin 11.9 (*)    HCT 36.0 (*)    Eosinophils Absolute 0.7 (*)    All other components within normal limits  COMPREHENSIVE METABOLIC PANEL - Abnormal; Notable for the  following components:   Chloride 97 (*)    Glucose, Bld 131 (*)    Creatinine, Ser  1.46 (*)    GFR calc non Af Amer 43 (*)    GFR calc Af Amer 50 (*)    All other components within normal limits  URIC ACID - Abnormal; Notable for the following components:   Uric Acid, Serum 9.7 (*)    All other components within normal limits  CBC - Abnormal; Notable for the following components:   RBC 3.66 (*)    Hemoglobin 11.3 (*)    HCT 33.5 (*)    All other components within normal limits  BASIC METABOLIC PANEL - Abnormal; Notable for the following components:   Potassium 3.4 (*)    Creatinine, Ser 1.43 (*)    Calcium 8.6 (*)    GFR calc non Af Amer 44 (*)    GFR calc Af Amer 51 (*)    All other components within normal limits  SARS CORONAVIRUS 2 BY RT PCR (HOSPITAL ORDER, [facility])  AEROBIC/ANAEROBIC CULTURE (SURGICAL/DEEP WOUND)    EKG EKG Interpretation  Date/Time:  Tuesday December 07 2018 14:46:57 EST Ventricular Rate:  82 PR Interval:  168 QRS Duration: 80 QT Interval:  366 QTC Calculation: 427 R Axis:   10 Text Interpretation: Sinus rhythm with frequent Premature ventricular complexes Anterior infarct , age undetermined Abnormal ECG No STEMI Confirmed by Nanda Quinton [phone]) on 12/08/2018 10:25:53 AM   Radiology Dg Finger Middle Left  Result Date: 12/07/2018 CLINICAL DATA:  Left middle finger cellulitis. EXAM: LEFT MIDDLE FINGER 2+V COMPARISON:  None. FINDINGS: Soft tissue swelling of the distal left middle finger is noted. There appears to be lytic destruction involving a large portion of the third distal phalanx most likely due to osteomyelitis, which results in pathologic fracture of the third distal phalanx. Lytic destruction of the distal portion of the third middle phalanx is noted also consistent with osteomyelitis. No foreign body is noted. IMPRESSION: Lytic destruction is seen involving the third middle and distal phalanges consistent with  osteomyelitis. This results in pathologic fracture of the third distal phalanx. Electronically Signed   By: Marijo Conception M.D.   On: 12/07/2018 14:25    Procedures Procedures (including critical care time)  Medications Ordered in ED Medications - No data to display   Initial Impression / Assessment and Plan / ED Course  I have reviewed the triage vital signs and the nursing notes.  Pertinent labs & imaging results that were available during my care of the patient were reviewed by me and considered in my medical decision making (see chart for details).  85 year old male presents to the ED for evaluation of left middle finger infection thought to be infected gouty arthritis.  Sent from PCP.  PA Orion Crook with hand surgery at bedside evaluating patient upon arrival.  They will plan to admit for surgery, depending on x-ray results patient will either require opening and debridement or partial amputation.  They request medicine admission for gout and infection management, but would like to hold off on antibiotics until they are able to obtain cultures in the OR.  Preop labs and EKG ordered.  X-ray shows lytic destruction of the third middle and distal phalanges consistent with osteomyelitis resulting in a pathologic fracture of the distal phalanx.  Rapid Covid ordered by [facility] prior to planned surgery this evening.  Patient to remain n.p.o.  Case discussed with Dr. Tamala Julian with [facility] hospitalist who will see and admit the patient.  Final Clinical Impressions(s) / ED Diagnoses   Final diagnoses:  Finger  infection    ED Discharge Orders    None       Jacqlyn Larsen, [place] 12/08/18 [place], [place], MD 12/09/18 1322

## 2018-12-07 NOTE — Transfer of Care (Signed)
Immediate Anesthesia Transfer of Care Note  Patient: Angel Cooper  Procedure(s) Performed: IRRIGATION AND DEBRIDEMENT EXTREMITY (Left ) Amputation Left Long Finger (Left )  Patient Location: PACU  Anesthesia Type:MAC  Level of Consciousness: awake, alert , oriented and patient cooperative  Airway & Oxygen Therapy: Patient Spontanous Breathing  Post-op Assessment: Report given to RN and Post -op Vital signs reviewed and stable  Post vital signs: Reviewed and stable  Last Vitals:  Vitals Value Taken Time  BP 145/70 12/07/18 1914  Temp 36.5 C 12/07/18 1900  Pulse 82 12/07/18 1919  Resp 16 12/07/18 1919  SpO2 96 % 12/07/18 1919  Vitals shown include unvalidated device data.  Last Pain:  Vitals:   12/07/18 1908  TempSrc:   PainSc: 6          Complications: No apparent anesthesia complications

## 2018-12-07 NOTE — ED Notes (Signed)
Son in room , COVID test done  Permit signed

## 2018-12-07 NOTE — Anesthesia Procedure Notes (Signed)
Procedure Name: MAC Date/Time: 12/07/2018 5:31 PM Performed by: Oletta Lamas, CRNA Pre-anesthesia Checklist: Patient identified, Emergency Drugs available, Suction available and Patient being monitored Patient Re-evaluated:Patient Re-evaluated prior to induction Oxygen Delivery Method: Simple face mask

## 2018-12-08 ENCOUNTER — Encounter (HOSPITAL_COMMUNITY): Payer: Self-pay | Admitting: Orthopedic Surgery

## 2018-12-08 DIAGNOSIS — D72829 Elevated white blood cell count, unspecified: Secondary | ICD-10-CM | POA: Diagnosis not present

## 2018-12-08 DIAGNOSIS — M109 Gout, unspecified: Secondary | ICD-10-CM | POA: Diagnosis not present

## 2018-12-08 DIAGNOSIS — M86142 Other acute osteomyelitis, left hand: Secondary | ICD-10-CM

## 2018-12-08 DIAGNOSIS — L03012 Cellulitis of left finger: Secondary | ICD-10-CM | POA: Diagnosis not present

## 2018-12-08 DIAGNOSIS — D638 Anemia in other chronic diseases classified elsewhere: Secondary | ICD-10-CM | POA: Diagnosis not present

## 2018-12-08 DIAGNOSIS — I25810 Atherosclerosis of coronary artery bypass graft(s) without angina pectoris: Secondary | ICD-10-CM | POA: Diagnosis not present

## 2018-12-08 DIAGNOSIS — I10 Essential (primary) hypertension: Secondary | ICD-10-CM | POA: Diagnosis not present

## 2018-12-08 LAB — BASIC METABOLIC PANEL
Anion gap: 11 (ref 5–15)
BUN: 19 mg/dL (ref 8–23)
CO2: 27 mmol/L (ref 22–32)
Calcium: 8.6 mg/dL — ABNORMAL LOW (ref 8.9–10.3)
Chloride: 99 mmol/L (ref 98–111)
Creatinine, Ser: 1.43 mg/dL — ABNORMAL HIGH (ref 0.61–1.24)
GFR calc Af Amer: 51 mL/min — ABNORMAL LOW (ref >60)
GFR calc non Af Amer: 44 mL/min — ABNORMAL LOW (ref >60)
Glucose, Bld: 88 mg/dL (ref 70–99)
Potassium: 3.4 mmol/L — ABNORMAL LOW (ref 3.5–5.1)
Sodium: 137 mmol/L (ref 135–145)

## 2018-12-08 LAB — CBC
HCT: 33.5 % — ABNORMAL LOW (ref 39.0–52.0)
Hemoglobin: 11.3 g/dL — ABNORMAL LOW (ref 13.0–17.0)
MCH: 30.9 pg (ref 26.0–34.0)
MCHC: 33.7 g/dL (ref 30.0–36.0)
MCV: 91.5 fL (ref 80.0–100.0)
Platelets: 294 10*3/uL (ref 150–400)
RBC: 3.66 MIL/uL — ABNORMAL LOW (ref 4.22–5.81)
RDW: 14.1 % (ref 11.5–15.5)
WBC: 8.9 10*3/uL (ref 4.0–10.5)
nRBC: 0 % (ref 0.0–0.2)

## 2018-12-08 MED ORDER — SULFAMETHOXAZOLE-TRIMETHOPRIM 800-160 MG PO TABS: 1.0000 | ORAL_TABLET | Freq: Two times a day (BID) | ORAL | 0 refills | Status: AC

## 2018-12-08 MED ORDER — COLCHICINE 0.6 MG PO TABS: 0.6000 mg | ORAL_TABLET | Freq: Every day | ORAL | 0 refills | Status: DC

## 2018-12-08 NOTE — Progress Notes (Signed)
Patient ID: NOBUO ROSETE, male   DOB: 1930-06-03, 85 y.o.   MRN: [id number] Patient and I discussed all issues.  At present time there is no evidence of vascular compromise or other issues.  The patient underwent a left middle finger amputation at the P2 level.  The patient and I discussed all issues.  At present juncture I would recommend potentially discharging him today on 2 weeks of p.o. antibiotics in the form of Bactrim DS 1 p.o. twice daily for good soft tissue coverage with follow-up in the office in 1 week for a wound check.  As we performed a primary amputation due to the severity of osteomyelitic focus I can follow the cultures up in my office in 1 week and see him prior to that if any problems occur.  His son Aaron Edelman is aware of this plan.  Once again, discharged on Bactrim DS x14 days and I would consider colchicine daily to prevent gout flare during the postop timeframe.  Typically will place him on colchicine 0.6 daily for 7 days as long as he has no untoward GI side effects.  All questions have been addressed at bedside.  Marquesha Robideau MD cell phone [phone]

## 2018-12-08 NOTE — Care Management Obs Status (Signed)
[facility] NOTIFICATION   Patient Details  Name: Angel Cooper MRN: [id number] Date of Birth: 12-Oct-1930   Medicare Observation Status Notification Given:  Yes    Georgeanna Lea, RN 12/08/2018, 4:23 PM

## 2018-12-08 NOTE — Progress Notes (Addendum)
Pt and pt's visiting family member educated and all questions and concerns answered. Patient's belongings sent home with patient. Awaiting discharge. Will continue to monitor.

## 2018-12-08 NOTE — Care Management CC44 (Signed)
Condition Code 44 Documentation Completed  Patient Details  Name: Angel Cooper MRN: [id number] Date of Birth: [birth year]   Condition Code 44 given:  Yes Patient signature on Condition Code 44 notice:  Yes Documentation of 2 MD's agreement:  Yes Code 44 added to claim:  Yes    Georgeanna Lea, RN 12/08/2018, 4:23 PM

## 2018-12-08 NOTE — Discharge Summary (Signed)
Physician Discharge Summary  Angel Cooper JQZ:[id number] DOB: 1931-01-06 DOA: 12/07/2018  PCP: [name], MD  Admit date: 12/07/2018 Discharge date: 12/08/2018  Admitted From: Home Disposition:  Home  Recommendations for Outpatient Follow-up:  1. Follow up with PCP in 1-2 weeks. 2. Follow-up with orthopedics, Dr. Amedeo Plenty in 1 week for wound check 3. Continue Bactrim double strength twice daily x2 weeks per orthopedics 4. Colchicine 0.6 mg p.o. daily for 7 days 5. Will need likely suppressive therapy for underlying gout in the future, defer to PCP 6. Please obtain BMP/CBC in one week Please follow up on the following pending results: Follow-up finalized operative wound culture  Home Health: No Equipment/Devices: None  Discharge Condition: Stable CODE STATUS: Full code Diet recommendation: Regular diet  History of present illness:  Angel Cooper is a 85 y.o. male with medical history significant of HTN, HLD, CAD s/p CABG, PAF, CHF, gout, and bladder cancer.  He presents after being asked to come in by his primary care provider for infection of the third digit of his left hand.  Patient reports that he has had swelling and yellow like stuck under his fingernail for quite some time.  He reports though over the last few weeks that a hole developed and started draining yellow fluid.  He notes that the finger has been sore.  He followed up at the clinic on Sunday and was started on doxycycline for his symptoms.  They also took swabs and sent them for culture.  He previously had similar thing happen to his little finger 2 years ago that required I&D in the outpatient setting by Dr. [name].  Denies having any significant fever, chills, nausea, vomiting, shortness of breath, cough, or diarrhea.  ED Course: Upon admission into the emergency department patient was noted to have stable vital signs.  Labs significant for WBC 10.6, hemoglobin 11.9, BUN 19, and creatinine 1.46.  X-rays  revealed lytic destruction of the bone of the third digit of the left hand concerning for osteomyelitis.  Orthopedics was consulted plan on taking the patient for surgery this afternoon.  TR[facility] called to admit.  Cultures to be taken following surgery.  Hospital course:  Septic gouty arthritis, cellulitis, osteomyelitis:  Patient presents with pain and swelling of the third digit of the left hand.  X-rays reveal lytic destruction consistent with osteomyelitis.  Uric acid elevated 9.7.  Started on antibiotics with cefazolin IV.  Orthopedics consulted, Dr. Amedeo Plenty; and patient underwent I&D with amputation of left middle finger on 12/07/2018.  Operative wound culture notable for few GPC's, further pending at time of discharge.  Orthopedics recommends to discharge home on Bactrim DS twice daily x14 days and 1 week follow-up in orthopedic office for wound check.  We will also continue colchicine 0.6 mg p.o. daily x7 days.  Leukocytosis:  WBC elevated 10.6 on admission.  Suspect related to infection as above.  Started on antibiotics as above.  WBC count at time of discharge improved to 8.9.  Essential hypertension:  --Continue amlodipine and furosemide  History of paroxysmal atrial fibrillation:  Patient not on any anticoagulation.  Currently in sinus rhythm.  Anemia of chronic disease:  Baseline hemoglobin between 12-13.  Hemoglobin at time of discharge 11.3.  Chronic kidney disease stage III: Stable. Last creatinine in EMR 1.37 on 04/17/2018.  Creatinine time of discharge 1.43.  CAD s/p bypass graft  History of bladder cancer s/p resection  GERD Continue home omeprazole  Discharge Diagnoses:  Active Problems:   CAD (  coronary artery disease) of artery bypass graft   Cellulitis, finger   Gouty arthritis   Osteomyelitis (HCC)   Leukocytosis   Essential hypertension   Anemia of chronic disease    Discharge Instructions  Discharge Instructions    Call MD for:  difficulty  breathing, headache or visual disturbances   Complete by: As directed    Call MD for:  extreme fatigue   Complete by: As directed    Call MD for:  persistant dizziness or light-headedness   Complete by: As directed    Call MD for:  persistant nausea and vomiting   Complete by: As directed    Call MD for:  redness, tenderness, or signs of infection (pain, swelling, redness, odor or green/yellow discharge around incision site)   Complete by: As directed    Call MD for:  severe uncontrolled pain   Complete by: As directed    Call MD for:  temperature >100.4   Complete by: As directed    Diet - low sodium heart healthy   Complete by: As directed    Increase activity slowly   Complete by: As directed      Allergies as of 12/08/2018      Reactions   Aspirin Other (See Comments)   Stomach pain    Benadryl [diphenhydramine Hcl (sleep)] Hives   Chlorhexidine Other (See Comments)   CAUSED SKIN IRRITATION AND BURNING SENSATION   Enalapril Other (See Comments)   Cannot take due to kidney compromise   Gabapentin Other (See Comments)   Caused nightmares      Medication List    STOP taking these medications   doxycycline 100 MG capsule Commonly known as: VIBRAMYCIN   oxyCODONE-acetaminophen 5-325 MG tablet Commonly known as: PERCOCET/ROXICET   phenazopyridine 200 MG tablet Commonly known as: Pyridium   predniSONE 20 MG tablet Commonly known as: DELTASONE     TAKE these medications   acetaminophen 500 MG tablet Commonly known as: TYLENOL Take 1,000 mg by mouth every 6 (six) hours as needed for mild pain or headache.   ALPRAZolam 0.25 MG tablet Commonly known as: XANAX Take 0.25 mg by mouth at bedtime as needed for sleep.   amLODipine 5 MG tablet Commonly known as: NORVASC Take 5 mg by mouth every morning.   B-12 Compliance Injection 1000 MCG/ML Kit Generic drug: Cyanocobalamin Inject 1,000 mcg as directed every 30 (thirty) days.   colchicine 0.6 MG tablet Take 1  tablet (0.6 mg total) by mouth daily for 7 days.   furosemide 40 MG tablet Commonly known as: LASIX Take 80 mg by mouth every morning.   HYDROcodone-acetaminophen 5-325 MG tablet Commonly known as: NORCO/VICODIN Take 1 tablet by mouth every 4 (four) hours as needed. What changed: reasons to take this   omeprazole 20 MG capsule Commonly known as: PRILOSEC Take 20 mg by mouth every morning.   psyllium 58.6 % powder Commonly known as: METAMUCIL Take 1 packet by mouth daily as needed (constipation).   sulfamethoxazole-trimethoprim 800-160 MG tablet Commonly known as: BACTRIM DS Take 1 tablet by mouth 2 (two) times daily for 14 days.   traMADol 50 MG tablet Commonly known as: ULTRAM Take 1-2 tablets (50-100 mg total) by mouth every 6 (six) hours as needed. What changed:   how much to take  reasons to take this   triamcinolone cream 0.1 % Commonly known as: KENALOG Apply 1 application topically See admin instructions. Apply to affected area(s) up to 2 times a day as needed  for itching and AVOID UNDERARMS, FACE, and GROIN AREA      Follow-up Information    [name], MD. Schedule an appointment as soon as possible for a visit in 1 week(s).   Specialty: Internal Medicine Contact information: [address] [place] [address] [phone]        Roseanne Kaufman, MD. Schedule an appointment as soon as possible for a visit in 1 week(s).   Specialty: Orthopedic Surgery Contact information: [address] [place] [address] [phone]          Allergies  Allergen Reactions  . Aspirin Other (See Comments)    Stomach pain   . Benadryl [Diphenhydramine Hcl (Sleep)] Hives  . Chlorhexidine Other (See Comments)    CAUSED SKIN IRRITATION AND BURNING SENSATION  . Enalapril Other (See Comments)    Cannot take due to kidney compromise  . Gabapentin Other (See Comments)    Caused nightmares    Consultations:  Orthopedics,  Dr. Amedeo Plenty   Procedures/Studies: Dg Finger Middle Left  Result Date: 12/07/2018 CLINICAL DATA:  Left middle finger cellulitis. EXAM: LEFT MIDDLE FINGER 2+V COMPARISON:  None. FINDINGS: Soft tissue swelling of the distal left middle finger is noted. There appears to be lytic destruction involving a large portion of the third distal phalanx most likely due to osteomyelitis, which results in pathologic fracture of the third distal phalanx. Lytic destruction of the distal portion of the third middle phalanx is noted also consistent with osteomyelitis. No foreign body is noted. IMPRESSION: Lytic destruction is seen involving the third middle and distal phalanges consistent with osteomyelitis. This results in pathologic fracture of the third distal phalanx. Electronically Signed   By: Marijo Conception M.D.   On: 12/07/2018 14:25       Subjective: Patient seen and examined at bedside, resting comfortably.  Eating breakfast.  Seen by orthopedics this morning with recommendations to discharge home on p.o. antibiotics x14 days.  No other complaints or concerns at this time.  Denies headache, no fever/chills/night sweats, no nausea/vomiting/diarrhea, no chest pain, no palpitations, no abdominal pain, no weakness, no fatigue, no cough/congestion.  No acute events overnight per nursing staff.   Discharge Exam: Vitals:   12/08/18 0423 12/08/18 0753  BP: 126/63 115/70  Pulse: 65 65  Resp: 16 17  Temp: 98 F (36.7 C) 97.6 F (36.4 C)  SpO2: 99%    Vitals:   12/07/18 1944 12/07/18 2004 12/08/18 0423 12/08/18 0753  BP: (!) 151/65 (!) 144/60 126/63 115/70  Pulse: 90 88 65 65  Resp: 13 18 16 17   Temp: 97.7 F (36.5 C) 98.2 F (36.8 C) 98 F (36.7 C) 97.6 F (36.4 C)  TempSrc:  Oral Oral Oral  SpO2: 95% 99% 99%     General: Pt is alert, awake, not in acute distress Cardiovascular: RRR, S1/S2 +, no rubs, no gallops Respiratory: CTA bilaterally, no wheezing, no rhonchi Abdominal: Soft, NT, ND,  bowel sounds + Extremities: no edema, no cyanosis, dressing to left hand noted in place with splint, dressing C/D/I    The results of significant diagnostics from this hospitalization (including imaging, microbiology, ancillary and laboratory) are listed below for reference.     Microbiology: Recent Results (from the past 240 hour(s))  SARS Coronavirus 2 by RT PCR (hospital order, performed in [facility] hospital lab) Nasopharyngeal Nasopharyngeal Swab     Status: None   Collection Time: 12/07/18  2:20 PM   Specimen: Nasopharyngeal Swab  Result  Value Ref Range Status   SARS Coronavirus 2 NEGATIVE NEGATIVE Final    Comment: (NOTE) If result is NEGATIVE SARS-CoV-2 target nucleic acids are NOT DETECTED. The SARS-CoV-2 RNA is generally detectable in upper and lower  respiratory specimens during the acute phase of infection. The lowest  concentration of SARS-CoV-2 viral copies this assay can detect is 250  copies / mL. A negative result does not preclude SARS-CoV-2 infection  and should not be used as the sole basis for treatment or other  patient management decisions.  A negative result may occur with  improper specimen collection / handling, submission of specimen other  than nasopharyngeal swab, presence of viral mutation(s) within the  areas targeted by this assay, and inadequate number of viral copies  (<250 copies / mL). A negative result must be combined with clinical  observations, patient history, and epidemiological information. If result is POSITIVE SARS-CoV-2 target nucleic acids are DETECTED. The SARS-CoV-2 RNA is generally detectable in upper and lower  respiratory specimens dur ing the acute phase of infection.  Positive  results are indicative of active infection with SARS-CoV-2.  Clinical  correlation with patient history and other diagnostic information is  necessary to determine patient infection status.  Positive results do  not rule out bacterial infection or  co-infection with other viruses. If result is PRESUMPTIVE POSTIVE SARS-CoV-2 nucleic acids MAY BE PRESENT.   A presumptive positive result was obtained on the submitted specimen  and confirmed on repeat testing.  While 2019 novel coronavirus  (SARS-CoV-2) nucleic acids may be present in the submitted sample  additional confirmatory testing may be necessary for epidemiological  and / or clinical management purposes  to differentiate between  SARS-CoV-2 and other Sarbecovirus currently known to infect humans.  If clinically indicated additional testing with an alternate test  methodology [phone]) is advised. The SARS-CoV-2 RNA is generally  detectable in upper and lower respiratory sp ecimens during the acute  phase of infection. The expected result is Negative. Fact Sheet for Patients:  StrictlyIdeas.no Fact Sheet for Healthcare Providers: BankingDealers.co.za This test is not yet approved or cleared by the [place] FDA and has been authorized for detection and/or diagnosis of SARS-CoV-2 by FDA under an Emergency Use Authorization (EUA).  This EUA will remain in effect (meaning this test can be used) for the duration of the COVID-19 declaration under Section 564(b)(1) of the Act, 21 U.S.C. section 360bbb-3(b)(1), unless the authorization is terminated or revoked sooner. Performed at [facility], [place] [address] [place], [place] [address]   Aerobic/Anaerobic Culture (surgical/deep wound)     Status: None (Preliminary result)   Collection Time: 12/07/18  5:52 PM   Specimen: PATH Cytology Misc. fluid; Body Fluid  Result Value Ref Range Status   Specimen Description WOUND LEFT FINGER  Final   Special Requests LEFT MIDDLE  Final   Gram Stain   Final    FEW WBC PRESENT, PREDOMINANTLY PMN FEW GRAM POSITIVE COCCI    Culture   Final    CULTURE REINCUBATED FOR BETTER GROWTH Performed at [facility], [place] [address]  [place], [place] [address]    Report Status PENDING  Incomplete     Labs: BNP (last 3 results) No results for input(s): BNP in the last 8760 hours. Basic Metabolic Panel: Recent Labs  Lab 12/07/18 1324 12/08/18 0508  NA 136 137  K 3.6 3.4*  CL 97* 99  CO2 28 27  GLUCOSE 131* 88  BUN 19 19  CREATININE 1.46* 1.43*  CALCIUM 9.1 8.6*   Liver Function Tests: Recent Labs  Lab 12/07/18 1324  AST 23  ALT 10  ALKPHOS 67  BILITOT 0.6  PROT 6.8  ALBUMIN 3.8   No results for input(s): LIPASE, AMYLASE in the last 168 hours. No results for input(s): AMMONIA in the last 168 hours. CBC: Recent Labs  Lab 12/07/18 1324 12/08/18 0508  WBC 10.6* 8.9  NEUTROABS 7.7  --   HGB 11.9* 11.3*  HCT 36.0* 33.5*  MCV 91.8 91.5  PLT 327 294   Cardiac Enzymes: No results for input(s): CKTOTAL, CKMB, CKMBINDEX, TROPONINI in the last 168 hours. BNP: Invalid input(s): POCBNP CBG: No results for input(s): GLUCAP in the last 168 hours. D-Dimer No results for input(s): DDIMER in the last 72 hours. Hgb A1c No results for input(s): HGBA1C in the last 72 hours. Lipid Profile No results for input(s): CHOL, HDL, LDLCALC, TRIG, CHOLHDL, LDLDIRECT in the last 72 hours. Thyroid function studies No results for input(s): TSH, T4TOTAL, T3FREE, THYROIDAB in the last 72 hours.  Invalid input(s): FREET3 Anemia work up No results for input(s): VITAMINB12, FOLATE, FERRITIN, TIBC, IRON, RETICCTPCT in the last 72 hours. Urinalysis    Component Value Date/Time   COLORURINE STRAW (A) 04/17/2018 1609   APPEARANCEUR CLEAR 04/17/2018 1609   LABSPEC 1.005 04/17/2018 1609   PHURINE 6.0 04/17/2018 1609   GLUCOSEU NEGATIVE 04/17/2018 1609   HGBUR NEGATIVE 04/17/2018 1609   BILIRUBINUR NEGATIVE 04/17/2018 1609   KETONESUR NEGATIVE 04/17/2018 1609   PROTEINUR NEGATIVE 04/17/2018 1609   UROBILINOGEN 0.2 01/28/2013 1204   NITRITE NEGATIVE 04/17/2018 1609   LEUKOCYTESUR NEGATIVE 04/17/2018 1609   Sepsis  Labs Invalid input(s): PROCALCITONIN,  WBC,  LACTICIDVEN Microbiology Recent Results (from the past 240 hour(s))  SARS Coronavirus 2 by RT PCR (hospital order, performed in [facility] hospital lab) Nasopharyngeal Nasopharyngeal Swab     Status: None   Collection Time: 12/07/18  2:20 PM   Specimen: Nasopharyngeal Swab  Result Value Ref Range Status   SARS Coronavirus 2 NEGATIVE NEGATIVE Final    Comment: (NOTE) If result is NEGATIVE SARS-CoV-2 target nucleic acids are NOT DETECTED. The SARS-CoV-2 RNA is generally detectable in upper and lower  respiratory specimens during the acute phase of infection. The lowest  concentration of SARS-CoV-2 viral copies this assay can detect is 250  copies / mL. A negative result does not preclude SARS-CoV-2 infection  and should not be used as the sole basis for treatment or other  patient management decisions.  A negative result may occur with  improper specimen collection / handling, submission of specimen other  than nasopharyngeal swab, presence of viral mutation(s) within the  areas targeted by this assay, and inadequate number of viral copies  (<250 copies / mL). A negative result must be combined with clinical  observations, patient history, and epidemiological information. If result is POSITIVE SARS-CoV-2 target nucleic acids are DETECTED. The SARS-CoV-2 RNA is generally detectable in upper and lower  respiratory specimens dur ing the acute phase of infection.  Positive  results are indicative of active infection with SARS-CoV-2.  Clinical  correlation with patient history and other diagnostic information is  necessary to determine patient infection status.  Positive results do  not rule out bacterial infection or co-infection with other viruses. If result is PRESUMPTIVE POSTIVE SARS-CoV-2 nucleic acids MAY BE PRESENT.   A presumptive positive result was obtained on the submitted specimen  and confirmed on repeat testing.  While 2019  novel  coronavirus  (SARS-CoV-2) nucleic acids may be present in the submitted sample  additional confirmatory testing may be necessary for epidemiological  and / or clinical management purposes  to differentiate between  SARS-CoV-2 and other Sarbecovirus currently known to infect humans.  If clinically indicated additional testing with an alternate test  methodology [phone] is advised. The SARS-CoV-2 RNA is generally  detectable in upper and lower respiratory sp ecimens during the acute  phase of infection. The expected result is Negative. Fact Sheet for Patients:  StrictlyIdeas.no Fact Sheet for Healthcare Providers: BankingDealers.co.za This test is not yet approved or cleared by the [place] FDA and has been authorized for detection and/or diagnosis of SARS-CoV-2 by FDA under an Emergency Use Authorization (EUA).  This EUA will remain in effect (meaning this test can be used) for the duration of the COVID-19 declaration under Section 564(b)(1) of the Act, 21 U.S.C. section 360bbb-3(b)(1), unless the authorization is terminated or revoked sooner. Performed at [facility], [place] [address], [place], [place] [address]   Aerobic/Anaerobic Culture (surgical/deep wound)     Status: None (Preliminary result)   Collection Time: 12/07/18  5:52 PM   Specimen: PATH Cytology Misc. fluid; Body Fluid  Result Value Ref Range Status   Specimen Description WOUND LEFT FINGER  Final   Special Requests LEFT MIDDLE  Final   Gram Stain   Final    FEW WBC PRESENT, PREDOMINANTLY PMN FEW GRAM POSITIVE COCCI    Culture   Final    CULTURE REINCUBATED FOR BETTER GROWTH Performed at [facility], [place] [address] [place], [place] [address]    Report Status PENDING  Incomplete     Time coordinating discharge: Over 30 minutes  SIGNED:   Cadden Elizondo J [place] DO  [facility] 12/08/2018, 12:19 PM

## 2018-12-12 LAB — AEROBIC/ANAEROBIC CULTURE W GRAM STAIN (SURGICAL/DEEP WOUND)

## 2018-12-13 DIAGNOSIS — Z4789 Encounter for other orthopedic aftercare: Secondary | ICD-10-CM | POA: Diagnosis not present

## 2018-12-13 DIAGNOSIS — M25522 Pain in left elbow: Secondary | ICD-10-CM | POA: Diagnosis not present

## 2018-12-16 DIAGNOSIS — I10 Essential (primary) hypertension: Secondary | ICD-10-CM | POA: Diagnosis not present

## 2018-12-16 DIAGNOSIS — M1A00X0 Idiopathic chronic gout, unspecified site, without tophus (tophi): Secondary | ICD-10-CM | POA: Diagnosis not present

## 2018-12-16 DIAGNOSIS — F418 Other specified anxiety disorders: Secondary | ICD-10-CM | POA: Diagnosis not present

## 2018-12-16 DIAGNOSIS — I2510 Atherosclerotic heart disease of native coronary artery without angina pectoris: Secondary | ICD-10-CM | POA: Diagnosis not present

## 2018-12-21 DIAGNOSIS — N179 Acute kidney failure, unspecified: Secondary | ICD-10-CM | POA: Diagnosis not present

## 2019-01-04 DIAGNOSIS — Z0000 Encounter for general adult medical examination without abnormal findings: Secondary | ICD-10-CM | POA: Diagnosis not present

## 2019-01-04 DIAGNOSIS — L299 Pruritus, unspecified: Secondary | ICD-10-CM | POA: Diagnosis not present

## 2019-01-04 DIAGNOSIS — Z1389 Encounter for screening for other disorder: Secondary | ICD-10-CM | POA: Diagnosis not present

## 2019-01-04 DIAGNOSIS — I2510 Atherosclerotic heart disease of native coronary artery without angina pectoris: Secondary | ICD-10-CM | POA: Diagnosis not present

## 2019-01-04 DIAGNOSIS — N1831 Chronic kidney disease, stage 3a: Secondary | ICD-10-CM | POA: Diagnosis not present

## 2019-01-04 DIAGNOSIS — I129 Hypertensive chronic kidney disease with stage 1 through stage 4 chronic kidney disease, or unspecified chronic kidney disease: Secondary | ICD-10-CM | POA: Diagnosis not present

## 2019-01-04 DIAGNOSIS — Z789 Other specified health status: Secondary | ICD-10-CM | POA: Diagnosis not present

## 2019-01-04 DIAGNOSIS — M109 Gout, unspecified: Secondary | ICD-10-CM | POA: Diagnosis not present

## 2019-01-04 DIAGNOSIS — K219 Gastro-esophageal reflux disease without esophagitis: Secondary | ICD-10-CM | POA: Diagnosis not present

## 2019-01-11 DIAGNOSIS — D510 Vitamin B12 deficiency anemia due to intrinsic factor deficiency: Secondary | ICD-10-CM | POA: Diagnosis not present

## 2019-01-18 DIAGNOSIS — M109 Gout, unspecified: Secondary | ICD-10-CM | POA: Diagnosis not present

## 2019-01-18 DIAGNOSIS — Z5181 Encounter for therapeutic drug level monitoring: Secondary | ICD-10-CM | POA: Diagnosis not present

## 2019-02-15 DIAGNOSIS — D510 Vitamin B12 deficiency anemia due to intrinsic factor deficiency: Secondary | ICD-10-CM | POA: Diagnosis not present

## 2019-02-21 DIAGNOSIS — G459 Transient cerebral ischemic attack, unspecified: Secondary | ICD-10-CM | POA: Diagnosis not present

## 2019-02-26 ENCOUNTER — Other Ambulatory Visit: Payer: Self-pay | Admitting: Internal Medicine

## 2019-02-26 DIAGNOSIS — G459 Transient cerebral ischemic attack, unspecified: Secondary | ICD-10-CM

## 2019-03-02 DIAGNOSIS — G459 Transient cerebral ischemic attack, unspecified: Secondary | ICD-10-CM | POA: Diagnosis not present

## 2019-03-08 ENCOUNTER — Other Ambulatory Visit: Payer: Self-pay

## 2019-03-08 ENCOUNTER — Ambulatory Visit
Admission: RE | Admit: 2019-03-08 | Discharge: 2019-03-08 | Disposition: A | Discharge status: Home / Self Care | Payer: Medicare HMO | Source: Ambulatory Visit | Attending: Internal Medicine | Admitting: Internal Medicine

## 2019-03-08 DIAGNOSIS — G459 Transient cerebral ischemic attack, unspecified: Secondary | ICD-10-CM | POA: Diagnosis not present

## 2019-03-21 DIAGNOSIS — M109 Gout, unspecified: Secondary | ICD-10-CM | POA: Diagnosis not present

## 2019-03-21 DIAGNOSIS — D510 Vitamin B12 deficiency anemia due to intrinsic factor deficiency: Secondary | ICD-10-CM | POA: Diagnosis not present

## 2019-04-18 DIAGNOSIS — D510 Vitamin B12 deficiency anemia due to intrinsic factor deficiency: Secondary | ICD-10-CM | POA: Diagnosis not present

## 2019-05-05 DIAGNOSIS — E7800 Pure hypercholesterolemia, unspecified: Secondary | ICD-10-CM | POA: Diagnosis not present

## 2019-05-05 DIAGNOSIS — N1832 Chronic kidney disease, stage 3b: Secondary | ICD-10-CM | POA: Diagnosis not present

## 2019-05-05 DIAGNOSIS — M1A00X0 Idiopathic chronic gout, unspecified site, without tophus (tophi): Secondary | ICD-10-CM | POA: Diagnosis not present

## 2019-05-05 DIAGNOSIS — Z789 Other specified health status: Secondary | ICD-10-CM | POA: Diagnosis not present

## 2019-05-05 DIAGNOSIS — I2510 Atherosclerotic heart disease of native coronary artery without angina pectoris: Secondary | ICD-10-CM | POA: Diagnosis not present

## 2019-05-05 DIAGNOSIS — I129 Hypertensive chronic kidney disease with stage 1 through stage 4 chronic kidney disease, or unspecified chronic kidney disease: Secondary | ICD-10-CM | POA: Diagnosis not present

## 2019-05-05 DIAGNOSIS — K219 Gastro-esophageal reflux disease without esophagitis: Secondary | ICD-10-CM | POA: Diagnosis not present

## 2019-05-19 DIAGNOSIS — D510 Vitamin B12 deficiency anemia due to intrinsic factor deficiency: Secondary | ICD-10-CM | POA: Diagnosis not present

## 2019-06-20 DIAGNOSIS — D510 Vitamin B12 deficiency anemia due to intrinsic factor deficiency: Secondary | ICD-10-CM | POA: Diagnosis not present

## 2019-06-22 DIAGNOSIS — Z135 Encounter for screening for eye and ear disorders: Secondary | ICD-10-CM | POA: Diagnosis not present

## 2019-06-22 DIAGNOSIS — Z961 Presence of intraocular lens: Secondary | ICD-10-CM | POA: Diagnosis not present

## 2019-06-22 DIAGNOSIS — Z0100 Encounter for examination of eyes and vision without abnormal findings: Secondary | ICD-10-CM | POA: Diagnosis not present

## 2019-06-22 DIAGNOSIS — H50112 Monocular exotropia, left eye: Secondary | ICD-10-CM | POA: Diagnosis not present

## 2019-06-22 DIAGNOSIS — H353130 Nonexudative age-related macular degeneration, bilateral, stage unspecified: Secondary | ICD-10-CM | POA: Diagnosis not present

## 2019-06-22 DIAGNOSIS — H43393 Other vitreous opacities, bilateral: Secondary | ICD-10-CM | POA: Diagnosis not present

## 2019-07-25 DIAGNOSIS — D510 Vitamin B12 deficiency anemia due to intrinsic factor deficiency: Secondary | ICD-10-CM | POA: Diagnosis not present

## 2019-08-03 DIAGNOSIS — N183 Chronic kidney disease, stage 3 unspecified: Secondary | ICD-10-CM | POA: Diagnosis not present

## 2019-08-03 DIAGNOSIS — R3911 Hesitancy of micturition: Secondary | ICD-10-CM | POA: Diagnosis not present

## 2019-08-24 DIAGNOSIS — M549 Dorsalgia, unspecified: Secondary | ICD-10-CM | POA: Diagnosis not present

## 2019-09-01 DIAGNOSIS — D510 Vitamin B12 deficiency anemia due to intrinsic factor deficiency: Secondary | ICD-10-CM | POA: Diagnosis not present

## 2019-09-07 DIAGNOSIS — D225 Melanocytic nevi of trunk: Secondary | ICD-10-CM | POA: Diagnosis not present

## 2019-09-07 DIAGNOSIS — X32XXXD Exposure to sunlight, subsequent encounter: Secondary | ICD-10-CM | POA: Diagnosis not present

## 2019-09-07 DIAGNOSIS — L821 Other seborrheic keratosis: Secondary | ICD-10-CM | POA: Diagnosis not present

## 2019-09-07 DIAGNOSIS — C44629 Squamous cell carcinoma of skin of left upper limb, including shoulder: Secondary | ICD-10-CM | POA: Diagnosis not present

## 2019-09-07 DIAGNOSIS — L570 Actinic keratosis: Secondary | ICD-10-CM | POA: Diagnosis not present

## 2019-09-27 DIAGNOSIS — X32XXXD Exposure to sunlight, subsequent encounter: Secondary | ICD-10-CM | POA: Diagnosis not present

## 2019-09-27 DIAGNOSIS — Z85828 Personal history of other malignant neoplasm of skin: Secondary | ICD-10-CM | POA: Diagnosis not present

## 2019-09-27 DIAGNOSIS — L570 Actinic keratosis: Secondary | ICD-10-CM | POA: Diagnosis not present

## 2019-09-27 DIAGNOSIS — D0462 Carcinoma in situ of skin of left upper limb, including shoulder: Secondary | ICD-10-CM | POA: Diagnosis not present

## 2019-09-27 DIAGNOSIS — Z08 Encounter for follow-up examination after completed treatment for malignant neoplasm: Secondary | ICD-10-CM | POA: Diagnosis not present

## 2019-09-27 DIAGNOSIS — C44629 Squamous cell carcinoma of skin of left upper limb, including shoulder: Secondary | ICD-10-CM | POA: Diagnosis not present

## 2019-10-04 DIAGNOSIS — D510 Vitamin B12 deficiency anemia due to intrinsic factor deficiency: Secondary | ICD-10-CM | POA: Diagnosis not present

## 2019-10-25 DIAGNOSIS — X32XXXD Exposure to sunlight, subsequent encounter: Secondary | ICD-10-CM | POA: Diagnosis not present

## 2019-10-25 DIAGNOSIS — Z08 Encounter for follow-up examination after completed treatment for malignant neoplasm: Secondary | ICD-10-CM | POA: Diagnosis not present

## 2019-10-25 DIAGNOSIS — L570 Actinic keratosis: Secondary | ICD-10-CM | POA: Diagnosis not present

## 2019-10-25 DIAGNOSIS — Z85828 Personal history of other malignant neoplasm of skin: Secondary | ICD-10-CM | POA: Diagnosis not present

## 2019-11-03 DIAGNOSIS — D510 Vitamin B12 deficiency anemia due to intrinsic factor deficiency: Secondary | ICD-10-CM | POA: Diagnosis not present

## 2019-11-03 DIAGNOSIS — Z23 Encounter for immunization: Secondary | ICD-10-CM | POA: Diagnosis not present

## 2019-11-25 DIAGNOSIS — C649 Malignant neoplasm of unspecified kidney, except renal pelvis: Secondary | ICD-10-CM | POA: Diagnosis not present

## 2019-11-25 DIAGNOSIS — I129 Hypertensive chronic kidney disease with stage 1 through stage 4 chronic kidney disease, or unspecified chronic kidney disease: Secondary | ICD-10-CM | POA: Diagnosis not present

## 2019-11-25 DIAGNOSIS — E7800 Pure hypercholesterolemia, unspecified: Secondary | ICD-10-CM | POA: Diagnosis not present

## 2019-11-25 DIAGNOSIS — F329 Major depressive disorder, single episode, unspecified: Secondary | ICD-10-CM | POA: Diagnosis not present

## 2019-11-25 DIAGNOSIS — I10 Essential (primary) hypertension: Secondary | ICD-10-CM | POA: Diagnosis not present

## 2019-11-25 DIAGNOSIS — D510 Vitamin B12 deficiency anemia due to intrinsic factor deficiency: Secondary | ICD-10-CM | POA: Diagnosis not present

## 2019-11-25 DIAGNOSIS — I2510 Atherosclerotic heart disease of native coronary artery without angina pectoris: Secondary | ICD-10-CM | POA: Diagnosis not present

## 2019-11-25 DIAGNOSIS — G459 Transient cerebral ischemic attack, unspecified: Secondary | ICD-10-CM | POA: Diagnosis not present

## 2019-11-25 DIAGNOSIS — I252 Old myocardial infarction: Secondary | ICD-10-CM | POA: Diagnosis not present

## 2019-12-06 DIAGNOSIS — D510 Vitamin B12 deficiency anemia due to intrinsic factor deficiency: Secondary | ICD-10-CM | POA: Diagnosis not present

## 2020-01-10 DIAGNOSIS — D510 Vitamin B12 deficiency anemia due to intrinsic factor deficiency: Secondary | ICD-10-CM | POA: Diagnosis not present

## 2020-01-10 DIAGNOSIS — Z1389 Encounter for screening for other disorder: Secondary | ICD-10-CM | POA: Diagnosis not present

## 2020-01-10 DIAGNOSIS — K219 Gastro-esophageal reflux disease without esophagitis: Secondary | ICD-10-CM | POA: Diagnosis not present

## 2020-01-10 DIAGNOSIS — E7800 Pure hypercholesterolemia, unspecified: Secondary | ICD-10-CM | POA: Diagnosis not present

## 2020-01-10 DIAGNOSIS — G720 Drug-induced myopathy: Secondary | ICD-10-CM | POA: Diagnosis not present

## 2020-01-10 DIAGNOSIS — I2510 Atherosclerotic heart disease of native coronary artery without angina pectoris: Secondary | ICD-10-CM | POA: Diagnosis not present

## 2020-01-10 DIAGNOSIS — N1832 Chronic kidney disease, stage 3b: Secondary | ICD-10-CM | POA: Diagnosis not present

## 2020-01-10 DIAGNOSIS — I129 Hypertensive chronic kidney disease with stage 1 through stage 4 chronic kidney disease, or unspecified chronic kidney disease: Secondary | ICD-10-CM | POA: Diagnosis not present

## 2020-01-10 DIAGNOSIS — M109 Gout, unspecified: Secondary | ICD-10-CM | POA: Diagnosis not present

## 2020-01-10 DIAGNOSIS — Z7189 Other specified counseling: Secondary | ICD-10-CM | POA: Diagnosis not present

## 2020-01-10 DIAGNOSIS — Z0000 Encounter for general adult medical examination without abnormal findings: Secondary | ICD-10-CM | POA: Diagnosis not present

## 2020-01-10 DIAGNOSIS — Z789 Other specified health status: Secondary | ICD-10-CM | POA: Diagnosis not present

## 2020-02-23 DIAGNOSIS — D510 Vitamin B12 deficiency anemia due to intrinsic factor deficiency: Secondary | ICD-10-CM | POA: Diagnosis not present

## 2020-03-27 DIAGNOSIS — D510 Vitamin B12 deficiency anemia due to intrinsic factor deficiency: Secondary | ICD-10-CM | POA: Diagnosis not present

## 2020-05-01 DIAGNOSIS — D510 Vitamin B12 deficiency anemia due to intrinsic factor deficiency: Secondary | ICD-10-CM | POA: Diagnosis not present

## 2020-05-17 DIAGNOSIS — E7800 Pure hypercholesterolemia, unspecified: Secondary | ICD-10-CM | POA: Diagnosis not present

## 2020-05-17 DIAGNOSIS — I129 Hypertensive chronic kidney disease with stage 1 through stage 4 chronic kidney disease, or unspecified chronic kidney disease: Secondary | ICD-10-CM | POA: Diagnosis not present

## 2020-05-17 DIAGNOSIS — G459 Transient cerebral ischemic attack, unspecified: Secondary | ICD-10-CM | POA: Diagnosis not present

## 2020-05-17 DIAGNOSIS — F329 Major depressive disorder, single episode, unspecified: Secondary | ICD-10-CM | POA: Diagnosis not present

## 2020-05-17 DIAGNOSIS — I10 Essential (primary) hypertension: Secondary | ICD-10-CM | POA: Diagnosis not present

## 2020-05-17 DIAGNOSIS — K219 Gastro-esophageal reflux disease without esophagitis: Secondary | ICD-10-CM | POA: Diagnosis not present

## 2020-05-17 DIAGNOSIS — I2510 Atherosclerotic heart disease of native coronary artery without angina pectoris: Secondary | ICD-10-CM | POA: Diagnosis not present

## 2020-05-17 DIAGNOSIS — D510 Vitamin B12 deficiency anemia due to intrinsic factor deficiency: Secondary | ICD-10-CM | POA: Diagnosis not present

## 2020-05-17 DIAGNOSIS — C649 Malignant neoplasm of unspecified kidney, except renal pelvis: Secondary | ICD-10-CM | POA: Diagnosis not present

## 2020-06-05 DIAGNOSIS — D510 Vitamin B12 deficiency anemia due to intrinsic factor deficiency: Secondary | ICD-10-CM | POA: Diagnosis not present

## 2020-06-19 DIAGNOSIS — N183 Chronic kidney disease, stage 3 unspecified: Secondary | ICD-10-CM | POA: Diagnosis not present

## 2020-06-19 DIAGNOSIS — R110 Nausea: Secondary | ICD-10-CM | POA: Diagnosis not present

## 2020-06-19 DIAGNOSIS — K5903 Drug induced constipation: Secondary | ICD-10-CM | POA: Diagnosis not present

## 2020-06-19 DIAGNOSIS — L299 Pruritus, unspecified: Secondary | ICD-10-CM | POA: Diagnosis not present

## 2020-06-21 DIAGNOSIS — H43813 Vitreous degeneration, bilateral: Secondary | ICD-10-CM | POA: Diagnosis not present

## 2020-06-21 DIAGNOSIS — H35363 Drusen (degenerative) of macula, bilateral: Secondary | ICD-10-CM | POA: Diagnosis not present

## 2020-06-21 DIAGNOSIS — H40023 Open angle with borderline findings, high risk, bilateral: Secondary | ICD-10-CM | POA: Diagnosis not present

## 2020-06-28 DIAGNOSIS — H524 Presbyopia: Secondary | ICD-10-CM | POA: Diagnosis not present

## 2020-06-28 DIAGNOSIS — H52223 Regular astigmatism, bilateral: Secondary | ICD-10-CM | POA: Diagnosis not present

## 2020-07-05 DIAGNOSIS — M170 Bilateral primary osteoarthritis of knee: Secondary | ICD-10-CM | POA: Diagnosis not present

## 2020-07-11 DIAGNOSIS — L299 Pruritus, unspecified: Secondary | ICD-10-CM | POA: Diagnosis not present

## 2020-07-11 DIAGNOSIS — D510 Vitamin B12 deficiency anemia due to intrinsic factor deficiency: Secondary | ICD-10-CM | POA: Diagnosis not present

## 2020-07-11 DIAGNOSIS — K5900 Constipation, unspecified: Secondary | ICD-10-CM | POA: Diagnosis not present

## 2020-07-11 DIAGNOSIS — I10 Essential (primary) hypertension: Secondary | ICD-10-CM | POA: Diagnosis not present

## 2020-07-11 DIAGNOSIS — F418 Other specified anxiety disorders: Secondary | ICD-10-CM | POA: Diagnosis not present

## 2020-07-26 DIAGNOSIS — I129 Hypertensive chronic kidney disease with stage 1 through stage 4 chronic kidney disease, or unspecified chronic kidney disease: Secondary | ICD-10-CM | POA: Diagnosis not present

## 2020-07-26 DIAGNOSIS — E7800 Pure hypercholesterolemia, unspecified: Secondary | ICD-10-CM | POA: Diagnosis not present

## 2020-07-26 DIAGNOSIS — D510 Vitamin B12 deficiency anemia due to intrinsic factor deficiency: Secondary | ICD-10-CM | POA: Diagnosis not present

## 2020-07-26 DIAGNOSIS — F329 Major depressive disorder, single episode, unspecified: Secondary | ICD-10-CM | POA: Diagnosis not present

## 2020-07-26 DIAGNOSIS — I2510 Atherosclerotic heart disease of native coronary artery without angina pectoris: Secondary | ICD-10-CM | POA: Diagnosis not present

## 2020-07-26 DIAGNOSIS — G459 Transient cerebral ischemic attack, unspecified: Secondary | ICD-10-CM | POA: Diagnosis not present

## 2020-07-26 DIAGNOSIS — I10 Essential (primary) hypertension: Secondary | ICD-10-CM | POA: Diagnosis not present

## 2020-07-26 DIAGNOSIS — C649 Malignant neoplasm of unspecified kidney, except renal pelvis: Secondary | ICD-10-CM | POA: Diagnosis not present

## 2020-07-26 DIAGNOSIS — I252 Old myocardial infarction: Secondary | ICD-10-CM | POA: Diagnosis not present

## 2020-07-31 DIAGNOSIS — K5909 Other constipation: Secondary | ICD-10-CM | POA: Diagnosis not present

## 2020-07-31 DIAGNOSIS — R609 Edema, unspecified: Secondary | ICD-10-CM | POA: Diagnosis not present

## 2020-08-14 DIAGNOSIS — D510 Vitamin B12 deficiency anemia due to intrinsic factor deficiency: Secondary | ICD-10-CM | POA: Diagnosis not present

## 2020-10-10 DIAGNOSIS — D510 Vitamin B12 deficiency anemia due to intrinsic factor deficiency: Secondary | ICD-10-CM | POA: Diagnosis not present

## 2020-10-10 DIAGNOSIS — Z23 Encounter for immunization: Secondary | ICD-10-CM | POA: Diagnosis not present

## 2020-11-07 DIAGNOSIS — D510 Vitamin B12 deficiency anemia due to intrinsic factor deficiency: Secondary | ICD-10-CM | POA: Diagnosis not present

## 2020-11-07 DIAGNOSIS — M25462 Effusion, left knee: Secondary | ICD-10-CM | POA: Diagnosis not present

## 2020-12-19 DIAGNOSIS — D510 Vitamin B12 deficiency anemia due to intrinsic factor deficiency: Secondary | ICD-10-CM | POA: Diagnosis not present

## 2020-12-19 DIAGNOSIS — M791 Myalgia, unspecified site: Secondary | ICD-10-CM | POA: Diagnosis not present

## 2021-01-02 DIAGNOSIS — R52 Pain, unspecified: Secondary | ICD-10-CM | POA: Diagnosis not present

## 2021-01-02 DIAGNOSIS — H3530 Unspecified macular degeneration: Secondary | ICD-10-CM | POA: Diagnosis not present

## 2021-01-04 DIAGNOSIS — M10072 Idiopathic gout, left ankle and foot: Secondary | ICD-10-CM | POA: Diagnosis not present

## 2021-01-04 DIAGNOSIS — I129 Hypertensive chronic kidney disease with stage 1 through stage 4 chronic kidney disease, or unspecified chronic kidney disease: Secondary | ICD-10-CM | POA: Diagnosis not present

## 2021-01-04 DIAGNOSIS — M159 Polyosteoarthritis, unspecified: Secondary | ICD-10-CM | POA: Diagnosis not present

## 2021-01-04 DIAGNOSIS — M109 Gout, unspecified: Secondary | ICD-10-CM | POA: Diagnosis not present

## 2021-01-24 DIAGNOSIS — M25569 Pain in unspecified knee: Secondary | ICD-10-CM | POA: Diagnosis not present

## 2021-01-29 ENCOUNTER — Inpatient Hospital Stay (HOSPITAL_COMMUNITY)
Admission: EM | Admit: 2021-01-29 | Discharge: 2021-02-02 | DRG: 553 | Disposition: A | Discharge status: Home Health | Payer: Medicare HMO | Attending: Internal Medicine | Admitting: Internal Medicine

## 2021-01-29 ENCOUNTER — Other Ambulatory Visit: Payer: Self-pay

## 2021-01-29 ENCOUNTER — Encounter (HOSPITAL_COMMUNITY): Payer: Self-pay

## 2021-01-29 ENCOUNTER — Emergency Department (HOSPITAL_COMMUNITY): Payer: Medicare HMO

## 2021-01-29 DIAGNOSIS — M1A9XX0 Chronic gout, unspecified, without tophus (tophi): Principal | ICD-10-CM | POA: Diagnosis present

## 2021-01-29 DIAGNOSIS — M109 Gout, unspecified: Secondary | ICD-10-CM | POA: Diagnosis present

## 2021-01-29 DIAGNOSIS — I959 Hypotension, unspecified: Secondary | ICD-10-CM | POA: Diagnosis not present

## 2021-01-29 DIAGNOSIS — Z79891 Long term (current) use of opiate analgesic: Secondary | ICD-10-CM

## 2021-01-29 DIAGNOSIS — Z886 Allergy status to analgesic agent status: Secondary | ICD-10-CM | POA: Diagnosis not present

## 2021-01-29 DIAGNOSIS — Z888 Allergy status to other drugs, medicaments and biological substances status: Secondary | ICD-10-CM | POA: Diagnosis not present

## 2021-01-29 DIAGNOSIS — I2510 Atherosclerotic heart disease of native coronary artery without angina pectoris: Secondary | ICD-10-CM | POA: Diagnosis present

## 2021-01-29 DIAGNOSIS — N1832 Chronic kidney disease, stage 3b: Secondary | ICD-10-CM | POA: Diagnosis present

## 2021-01-29 DIAGNOSIS — R0602 Shortness of breath: Secondary | ICD-10-CM | POA: Diagnosis not present

## 2021-01-29 DIAGNOSIS — G8929 Other chronic pain: Secondary | ICD-10-CM

## 2021-01-29 DIAGNOSIS — K219 Gastro-esophageal reflux disease without esophagitis: Secondary | ICD-10-CM | POA: Diagnosis present

## 2021-01-29 DIAGNOSIS — M1990 Unspecified osteoarthritis, unspecified site: Secondary | ICD-10-CM | POA: Diagnosis present

## 2021-01-29 DIAGNOSIS — E785 Hyperlipidemia, unspecified: Secondary | ICD-10-CM | POA: Diagnosis present

## 2021-01-29 DIAGNOSIS — Z8554 Personal history of malignant neoplasm of ureter: Secondary | ICD-10-CM

## 2021-01-29 DIAGNOSIS — Z951 Presence of aortocoronary bypass graft: Secondary | ICD-10-CM

## 2021-01-29 DIAGNOSIS — Z905 Acquired absence of kidney: Secondary | ICD-10-CM

## 2021-01-29 DIAGNOSIS — M7989 Other specified soft tissue disorders: Secondary | ICD-10-CM | POA: Diagnosis not present

## 2021-01-29 DIAGNOSIS — R509 Fever, unspecified: Secondary | ICD-10-CM | POA: Diagnosis not present

## 2021-01-29 DIAGNOSIS — D72829 Elevated white blood cell count, unspecified: Secondary | ICD-10-CM | POA: Diagnosis present

## 2021-01-29 DIAGNOSIS — Z79899 Other long term (current) drug therapy: Secondary | ICD-10-CM | POA: Diagnosis not present

## 2021-01-29 DIAGNOSIS — M25562 Pain in left knee: Secondary | ICD-10-CM | POA: Diagnosis not present

## 2021-01-29 DIAGNOSIS — I480 Paroxysmal atrial fibrillation: Secondary | ICD-10-CM | POA: Diagnosis present

## 2021-01-29 DIAGNOSIS — R262 Difficulty in walking, not elsewhere classified: Secondary | ICD-10-CM | POA: Insufficient documentation

## 2021-01-29 DIAGNOSIS — J9601 Acute respiratory failure with hypoxia: Secondary | ICD-10-CM | POA: Diagnosis not present

## 2021-01-29 DIAGNOSIS — R0902 Hypoxemia: Secondary | ICD-10-CM | POA: Diagnosis not present

## 2021-01-29 DIAGNOSIS — I10 Essential (primary) hypertension: Secondary | ICD-10-CM | POA: Diagnosis present

## 2021-01-29 DIAGNOSIS — Z87891 Personal history of nicotine dependence: Secondary | ICD-10-CM | POA: Diagnosis not present

## 2021-01-29 DIAGNOSIS — D75839 Thrombocytosis, unspecified: Secondary | ICD-10-CM | POA: Diagnosis present

## 2021-01-29 DIAGNOSIS — J9690 Respiratory failure, unspecified, unspecified whether with hypoxia or hypercapnia: Secondary | ICD-10-CM | POA: Diagnosis not present

## 2021-01-29 DIAGNOSIS — Z20822 Contact with and (suspected) exposure to covid-19: Secondary | ICD-10-CM | POA: Diagnosis present

## 2021-01-29 DIAGNOSIS — I129 Hypertensive chronic kidney disease with stage 1 through stage 4 chronic kidney disease, or unspecified chronic kidney disease: Secondary | ICD-10-CM | POA: Diagnosis present

## 2021-01-29 DIAGNOSIS — M25561 Pain in right knee: Secondary | ICD-10-CM | POA: Diagnosis not present

## 2021-01-29 DIAGNOSIS — R609 Edema, unspecified: Secondary | ICD-10-CM | POA: Diagnosis not present

## 2021-01-29 DIAGNOSIS — I252 Old myocardial infarction: Secondary | ICD-10-CM | POA: Diagnosis not present

## 2021-01-29 DIAGNOSIS — Z961 Presence of intraocular lens: Secondary | ICD-10-CM | POA: Diagnosis present

## 2021-01-29 DIAGNOSIS — I517 Cardiomegaly: Secondary | ICD-10-CM | POA: Diagnosis not present

## 2021-01-29 DIAGNOSIS — N183 Chronic kidney disease, stage 3 unspecified: Secondary | ICD-10-CM | POA: Diagnosis present

## 2021-01-29 LAB — COMPREHENSIVE METABOLIC PANEL
ALT: 11 U/L (ref 0–44)
AST: 26 U/L (ref 15–41)
Albumin: 3.2 g/dL — ABNORMAL LOW (ref 3.5–5.0)
Alkaline Phosphatase: 68 U/L (ref 38–126)
Anion gap: 13 (ref 5–15)
BUN: 25 mg/dL — ABNORMAL HIGH (ref 8–23)
CO2: 26 mmol/L (ref 22–32)
Calcium: 8.6 mg/dL — ABNORMAL LOW (ref 8.9–10.3)
Chloride: 94 mmol/L — ABNORMAL LOW (ref 98–111)
Creatinine, Ser: 1.57 mg/dL — ABNORMAL HIGH (ref 0.61–1.24)
GFR, Estimated: 42 mL/min — ABNORMAL LOW (ref >60)
Glucose, Bld: 121 mg/dL — ABNORMAL HIGH (ref 70–99)
Potassium: 3.5 mmol/L (ref 3.5–5.1)
Sodium: 133 mmol/L — ABNORMAL LOW (ref 135–145)
Total Bilirubin: 0.8 mg/dL (ref 0.3–1.2)
Total Protein: 6.8 g/dL (ref 6.5–8.1)

## 2021-01-29 LAB — CBC WITH DIFFERENTIAL/PLATELET
Abs Immature Granulocytes: 0.08 10*3/uL — ABNORMAL HIGH (ref 0.00–0.07)
Basophils Absolute: 0.1 10*3/uL (ref 0.0–0.1)
Basophils Relative: 0 %
Eosinophils Absolute: 0.1 10*3/uL (ref 0.0–0.5)
Eosinophils Relative: 1 %
HCT: 30 % — ABNORMAL LOW (ref 39.0–52.0)
Hemoglobin: 9.9 g/dL — ABNORMAL LOW (ref 13.0–17.0)
Immature Granulocytes: 1 %
Lymphocytes Relative: 7 %
Lymphs Abs: 1.2 10*3/uL (ref 0.7–4.0)
MCH: 29.5 pg (ref 26.0–34.0)
MCHC: 33 g/dL (ref 30.0–36.0)
MCV: 89.3 fL (ref 80.0–100.0)
Monocytes Absolute: 1.4 10*3/uL — ABNORMAL HIGH (ref 0.1–1.0)
Monocytes Relative: 8 %
Neutro Abs: 13.7 10*3/uL — ABNORMAL HIGH (ref 1.7–7.7)
Neutrophils Relative %: 83 %
Platelets: 564 10*3/uL — ABNORMAL HIGH (ref 150–400)
RBC: 3.36 MIL/uL — ABNORMAL LOW (ref 4.22–5.81)
RDW: 15.7 % — ABNORMAL HIGH (ref 11.5–15.5)
WBC: 16.6 10*3/uL — ABNORMAL HIGH (ref 4.0–10.5)
nRBC: 0 % (ref 0.0–0.2)

## 2021-01-29 LAB — URINALYSIS, ROUTINE W REFLEX MICROSCOPIC
Bilirubin Urine: NEGATIVE
Glucose, UA: NEGATIVE mg/dL
Hgb urine dipstick: NEGATIVE
Ketones, ur: NEGATIVE mg/dL
Leukocytes,Ua: NEGATIVE
Nitrite: NEGATIVE
Protein, ur: NEGATIVE mg/dL
Specific Gravity, Urine: 1.015 (ref 1.005–1.030)
pH: 5.5 (ref 5.0–8.0)

## 2021-01-29 LAB — SEDIMENTATION RATE: Sed Rate: 85 mm/hr — ABNORMAL HIGH (ref 0–16)

## 2021-01-29 LAB — C-REACTIVE PROTEIN: CRP: 20 mg/dL — ABNORMAL HIGH (ref <1.0)

## 2021-01-29 LAB — URIC ACID: Uric Acid, Serum: 8.1 mg/dL (ref 3.7–8.6)

## 2021-01-29 LAB — CK: Total CK: 198 U/L (ref 49–397)

## 2021-01-29 MED ORDER — LACTATED RINGERS IV BOLUS
500.0000 mL | Freq: Once | INTRAVENOUS | Status: AC
  Administered 2021-01-29: 500 mL via INTRAVENOUS

## 2021-01-29 MED ORDER — ONDANSETRON HCL 4 MG/2ML IJ SOLN
4.0000 mg | Freq: Once | INTRAMUSCULAR | Status: AC
  Administered 2021-01-29: 4 mg via INTRAVENOUS
  Filled 2021-01-29: qty 2

## 2021-01-29 MED ORDER — KETOROLAC TROMETHAMINE 15 MG/ML IJ SOLN
15.0000 mg | Freq: Once | INTRAMUSCULAR | Status: AC
  Administered 2021-01-29: 15 mg via INTRAVENOUS
  Filled 2021-01-29: qty 1

## 2021-01-29 MED ORDER — MORPHINE SULFATE (PF) 4 MG/ML IV SOLN
4.0000 mg | Freq: Once | INTRAVENOUS | Status: AC
  Administered 2021-01-29: 4 mg via INTRAVENOUS
  Filled 2021-01-29: qty 1

## 2021-01-29 NOTE — ED Provider Notes (Signed)
Emergency Medicine Provider Triage Evaluation Note  Angel Cooper , a 88 y.o. male  was evaluated in triage.  Pt complains of bilateral knee swelling and pain. States that same has been ongoing for 1 week now and has gotten so bad that he can no longer walk or get out of bed due to pain. Denies fevers or chills. Does endorse darker colored urine in the past week or so.   Review of Systems  Positive: Bilateral knee swelling and pain Negative: Fevers, chills  Physical Exam  BP 130/64 (BP Location: Left Arm)    Pulse 94    Temp 98.6 F (37 C) (Oral)    Resp 18    Ht 5\' 4"  (1.626 m)    Wt 52.2 kg    SpO2 100%    BMI 19.74 kg/m  Gen:   Awake, no distress   Resp:  Normal effort  MSK:   Moves extremities without difficulty  Other:  Swelling noted to bilateral knees without erythema or warmth. Distal pulses intact. No obvious deformity. Minimal calf pain that is equal and bilateral without erythema or edema.  Medical Decision Making  Medically screening exam initiated at 11:49 AM.  Appropriate orders placed.  ARMONTE TORTORELLA was informed that the remainder of the evaluation will be completed by another provider, this initial triage assessment does not replace that evaluation, and the importance of remaining in the ED until their evaluation is complete.     Nestor Lewandowsky 01/29/21 1152    Pattricia Boss, MD 01/29/21 [phone]

## 2021-01-29 NOTE — H&P (Addendum)
History and Physical    Angel Cooper AGT:[id number] DOB: 12/12/1930 DOA: 01/29/2021  PCP: [name], MD   Patient coming from: Home  Chief Complaint: Bilateral knee pain  HPI: Angel Cooper is a 88 y.o. male with medical history significant for HTN, Gout, CKD 3, OA, CAD s/p CABG who presents for evaluation of bilateral knee pain.  He reports he has had knee pain for a long time but in the last few days has become acutely worse to the point that he is not able to stand up or ambulate.  He lives at home with his elderly wife been able to do any activities of daily living.  He denies any falls or injuries to his knees.  He states he has not had any fever, chills, chest pain, shortness of breath, cough, abdominal pain, nausea vomiting or diarrhea.  He has a history of gout and a few weeks ago his allopurinol dose was increased to 300 mg a day.  He is not on any steroids.  He states he has not seen orthopedic surgery for his knee pain in the past.  He was taking pain pill at home that was provided by his doctor but it did not provide much relief.  He is not sure the name of the pain pill.   ED Course: Mr. Tantillo has been dynamically stable in the emergency room.  X-Rays of the knees reveal severe osteoarthritic changes with a joint effusion on the left.  Lab work reveals WBC 16,600 hemoglobin 9.9 hematocrit 30 platelets 564,000, CK198 sodium 133 potassium 3.5 chloride 94 bicarb 26 creatinine 1.57 which is baseline BUN 25 calcium 8.6 glucose 121 alkaline phosphatase 68 albumin 3.2 AST 26 ALT 11 bilirubin 0.8, urinalysis negative.  COVID swab pending.  Hospitalist service been asked to evaluate and manage patient overnight.  Orthopedic surgeries been consulted by the ER physician and will evaluate patient in the morning and proceed with arthrocentesis of knee if they feel it is appropriate  Review of Systems:  Reports bilateral knee pain and inability to walk.  All other systems negative  Past  Medical History:  Diagnosis Date   Atrial fibrillation ([place])    2015  W/ RVR   Bladder tumor    CHF (congestive heart failure) ([place]) 2015   CKD (chronic kidney disease), stage III (HCC)    Coronary artery disease    NO LONGER SEES CARDIOLOGIST - LAST SAW DR. 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[facility]) urologist-  dr herrick/  oncologist- dr [name]   dx 2014  s/p  right nephroureterectomy 01-24-2013 (T3, N0) invasive high grade urothelial carcinoma--     Past Surgical History:  Procedure Laterality Date   AMPUTATION FINGER Left 12/07/2018   Procedure: Amputation Left Long Finger;  Surgeon: Roseanne Kaufman, MD;  Location: [place];  Service: Orthopedics;  Laterality: Left;   CARDIAC CATHETERIZATION  07/05/1999   non-q MI;  3vessel CAD  w/ high grade pLAD and mLAD, subtotal ramus branch,  OM1 70-80%,  ostialCFX 50-60%,  dCFx 80%,  RI 99%,  total occluded pRCA and fills last by right-to-right collaterals,  pLM  20% narrowing;  severe inferoapical hypokinesis   CATARACT EXTRACTION W/ INTRAOCULAR LENS  IMPLANT, BILATERAL     CORONARY ARTERY BYPASS GRAFT  06/ 2001  dr [name] tright   CYSTOSCOPY W/ RETROGRADES Left 01/31/2016   Procedure: CYSTOSCOPY WITH RETROGRADE PYELOGRAM;  Surgeon: [name], MD;  Location: [facility];  Service: Urology;  Laterality: Left;   CYSTOSCOPY WITH RETROGRADE PYELOGRAM, URETEROSCOPY AND STENT PLACEMENT Right 12/03/2012   Procedure: CYSTOSCOPY WITH RIGHT RETROGRADE PYELOGRAM,RIGHT URETERAL WASHINGS, RIGHT URETERAL  BIOPSY, RIGHT URETEROSCOPY AND STENT PLACEMENT;  Surgeon: [name], MD;  Location: [facility];  Service: Urology;  Laterality: Right;   CYSTOSCOPY WITH RETROGRADE PYELOGRAM, URETEROSCOPY AND STENT PLACEMENT Right 12/15/2012   Procedure: CYSTOSCOPY WITH RIGHT RETROGRADE PYELOGRAM, POSSIBLE RIGHT URETEROSCOPY AND STENT PLACEMENT AND REMOVAL OF LEFT STENT;  Surgeon: [name], MD;  Location: [facility];  Service: Urology;  Laterality: Right;   CYSTOSCOPY WITH STENT PLACEMENT Right 01/24/2013   Procedure: CYSTOSCOPY WITH STENT PLACEMENT;  Surgeon: [name], MD;  Location[facility];  Service: Urology;  Laterality: Right;   I & D EXTREMITY Left 12/07/2018   Procedure: IRRIGATION AND DEBRIDEMENT EXTREMITY;  Surgeon: Roseanne Kaufman, MD;  Location: [place];  Service: Orthopedics;  Laterality: Left;   INCISION AND DRAINAGE ABSCESS Left 06/02/2016   Procedure: INCISION AND DRAINAGE LEFT SMALL FINGER;  Surgeon: [name], MD;  Location: [place];  Service: Orthopedics;  Laterality: Left;   ROBOT ASSITED LAPAROSCOPIC NEPHROURETERECTOMY Right 01/24/2013   Procedure: ROBOT ASSISTED LAPAROSCOPIC NEPHROURETERECTOMY WITH BILATERAL LYMPH NODE DISSECTION ;  Surgeon: [name], MD;  Location: [facility];  Service: Urology;  Laterality: Right;  right kidney   TRANSTHORACIC ECHOCARDIOGRAM  01/28/2013   mild LVH,  ef 45-50%, diffuse hypokinesis,  grade 1 diastolic dysfunction/  moderate LAE/  trivial PR/  mild TR, PASP 50-65mHg   TRANSURETHRAL RESECTION OF BLADDER TUMOR N/A 01/31/2016   Procedure: TRANSURETHRAL RESECTION OF BLADDER TUMOR (TURBT);  Surgeon: BArdis Hughs MD;  [facility]  Service: Urology;  Laterality: N/A;    Social History  reports that he quit smoking about 63 years ago. He has never used smokeless tobacco. He reports current alcohol use. He reports that he does not use drugs.  Allergies  Allergen Reactions   Aspirin Other (See  Comments)    Stomach pain    Benadryl [Diphenhydramine Hcl (Sleep)] Hives   Chlorhexidine Other (See Comments)    CAUSED SKIN IRRITATION AND BURNING SENSATION   Enalapril Other (See Comments)    Cannot take due to kidney compromise   Gabapentin Other (See Comments)    Caused nightmares    History reviewed. No pertinent family history.   Prior to Admission medications   Medication Sig Start Date End Date Taking? Authorizing Provider  acetaminophen (TYLENOL) 500 MG tablet Take 1,000 mg by mouth every 6 (six) hours as needed for mild pain or headache.     [provider]  allopurinol (ZYLOPRIM) 100 MG tablet Take 100 mg by mouth daily. 01/10/21   [provider]  allopurinol (ZYLOPRIM) 300 MG tablet Take 300 mg by mouth daily. 01/24/21   [provider]  ALPRAZolam (Duanne Moron 0.25 MG tablet Take 0.25 mg by mouth at bedtime as needed for  sleep.  11/19/18   [provider]  amLODipine (NORVASC) 5 MG tablet Take 5 mg by mouth every morning.    [provider]  colchicine 0.6 MG tablet Take 1 tablet (0.6 mg total) by mouth daily for 7 days. 12/08/18 12/15/18  [place] Donnamarie Poag, DO  Cyanocobalamin (B-12 COMPLIANCE INJECTION) 1000 MCG/ML KIT Inject 1,000 mcg as directed every 30 (thirty) days.    [provider]  cyproheptadine (PERIACTIN) 4 MG tablet Take 4 mg by mouth at bedtime as needed. 09/25/20   [provider]  furosemide (LASIX) 40 MG tablet Take 80 mg by mouth every morning.     [provider]  HYDROcodone-acetaminophen (NORCO/VICODIN) 5-325 MG tablet Take 1 tablet by mouth every 4 (four) hours as needed. Patient taking differently: Take 1 tablet by mouth every 4 (four) hours as needed (for pain). 04/17/18   Isla Pence, MD  omeprazole (PRILOSEC) 20 MG capsule Take 20 mg by mouth every morning.  11/16/18   [provider]  psyllium (METAMUCIL) 58.6 % powder Take 1 packet by mouth daily as needed  (constipation).    [provider]  traMADol (ULTRAM) 50 MG tablet Take 1-2 tablets (50-100 mg total) by mouth every 6 (six) hours as needed. Patient taking differently: Take 50 mg by mouth every 6 (six) hours as needed (for pain).  01/31/16   [name], MD  triamcinolone cream (KENALOG) 0.1 % Apply 1 application topically See admin instructions. Apply to affected area(s) up to 2 times a day as needed for itching and AVOID UNDERARMS, FACE, and GROIN AREA 08/19/18   [provider]    Physical Exam: Vitals:   01/29/21 1127 01/29/21 1356 01/29/21 1634 01/29/21 1924  BP:  139/70 140/78 128/84  Pulse:  96 (!) 134 96  Resp:  20 18 20   Temp:    98.2 F (36.8 C)  TempSrc:    Oral  SpO2:  100% 100% 99%  Weight: 52.2 kg     Height: 5' 4"  (1.626 m)       Constitutional: NAD, calm, comfortable Vitals:   01/29/21 1127 01/29/21 1356 01/29/21 1634 01/29/21 1924  BP:  139/70 140/78 128/84  Pulse:  96 (!) 134 96  Resp:  20 18 20   Temp:    98.2 F (36.8 C)  TempSrc:    Oral  SpO2:  100% 100% 99%  Weight: 52.2 kg     Height: 5' 4"  (1.626 m)      General: WDWN, Alert and oriented x3.  Neck: normal range of motion, supple, no masses, Trachea midline Respiratory: clear to auscultation bilaterally, no wheezing, no crackles. Normal respiratory effort. No accessory muscle use.  Cardiovascular: Regular rate and rhythm, no murmurs / rubs / gallops. No extremity edema. 2+ pedal pulses.  Healed midsternal scar Abdomen: Soft, no tenderness, nondistended, no rebound or guarding. Bowel sounds normoactive Musculoskeletal: FROM of upper extremities.  Patient does not move his leg secondary to severe knee pain with any movement or palpation of knees.  Knee joints are warm to touch but not erythematous.  Mild swelling of left knee compared to right.  no cyanosis.  Skin: Warm, dry, intact no rashes, lesions, ulcers. No induration Neurologic: CN 2-12 grossly intact.  Normal speech.   Psychiatric: Normal judgment and insight.  Normal mood.    Labs on Admission: I have personally reviewed following labs and imaging studies  CBC: Recent Labs  Lab 01/29/21 1159  WBC 16.6*  NEUTROABS 13.7*  HGB 9.9*  HCT 30.0*  MCV 89.3  PLT 564*    Basic Metabolic Panel: Recent Labs  Lab 01/29/21 1159  NA 133*  K 3.5  CL 94*  CO2 26  GLUCOSE 121*  BUN 25*  CREATININE 1.57*  CALCIUM 8.6*    GFR: Estimated Creatinine Clearance: 24 mL/min (A) (by C-G formula based on SCr of 1.57 mg/dL (H)).  Liver Function Tests: Recent Labs  Lab 01/29/21 1159  AST 26  ALT 11  ALKPHOS 68  BILITOT 0.8  PROT 6.8  ALBUMIN 3.2*    Urine analysis:    Component Value Date/Time   COLORURINE YELLOW 01/29/2021 1150   APPEARANCEUR CLEAR 01/29/2021 1150   LABSPEC 1.015 01/29/2021 1150   PHURINE 5.5 01/29/2021 1150   GLUCOSEU NEGATIVE 01/29/2021 1150   HGBUR NEGATIVE 01/29/2021 1150   BILIRUBINUR NEGATIVE 01/29/2021 1150   KETONESUR NEGATIVE 01/29/2021 1150   PROTEINUR NEGATIVE 01/29/2021 1150   UROBILINOGEN 0.2 01/28/2013 1204   NITRITE NEGATIVE 01/29/2021 1150   LEUKOCYTESUR NEGATIVE 01/29/2021 1150    Radiological Exams on Admission: DG Knee Complete 4 Views Left  Result Date: 01/29/2021 CLINICAL DATA:  Left knee pain and swelling EXAM: LEFT KNEE - COMPLETE 4+ VIEW COMPARISON:  09/26/2016 FINDINGS: Negative for fracture or bone lesion. Small to moderate joint effusion. Advanced degenerative change in the lateral joint space with joint space narrowing and spurring. Moderate to advanced degenerative change in the patellofemoral joint. Mild degenerative spurring medially. Overall no change from prior study IMPRESSION: Advanced degenerative change with joint effusion. No acute abnormality no change from 2018. Electronically Signed   By: Franchot Gallo M.D.   On: 01/29/2021 12:31   DG Knee Complete 4 Views Right  Result Date: 01/29/2021 CLINICAL DATA:  Chronic knee pain  bilaterally EXAM: RIGHT KNEE - COMPLETE 4+ VIEW COMPARISON:  None. FINDINGS: Negative for fracture.  Moderate joint effusion Tricompartmental degenerative change most severe in the medial joint space. No focal bone lesion. Atherosclerotic calcification IMPRESSION: Moderate to advanced tricompartmental degenerative change. No acute abnormality Electronically Signed   By: Franchot Gallo M.D.   On: 01/29/2021 12:44     Assessment/Plan Principal Problem:   Knee pain, bilateral Mr. Epp placed on Med Surg floor for observation and pain control.  He reports a chronic history of knee pain but has gotten significantly worse in the last few days.  He is not able to get up or ambulate at home. Patient given dose of Toradol in the emergency room.  Will provide Norco as needed for moderate pain and Dilaudid IV for severe pain.  Given colchicine Orthopedic surgery was consulted and will see patient in the morning determine if arthrocentesis of knee joints required and if it is they will do this. Physical therapy evaluation in the morning Will hold off on antibiotic until arthrocentesis of joint. Feel unlikely to be septic arthritis with no erythema, fever and pain being bilateral.  CRP ordered by ER physician  Active Problems:   CKD (chronic kidney disease) stage 3, GFR 30-59 ml/min  Stable.  Monitor electrolytes and renal function with labs in a.m.    Essential hypertension Continue Norvasc.  Monitor blood pressure    Gouty arthritis History of gout.  Check uric acid level which has been ordered and is pending. Continue allopurinol.  Start colchicine    Leukocytosis Elevated WBC of 16,000.  No signs or symptoms of infection. Check CBC in morning    Unable to walk Secondary to pain in knees  DVT prophylaxis: Lovenox for DVT prophylaxis, elevated Padua score  Code Status:   Full Code  Family Communication:  Diagnosis and plan discussed with patient.  Patient verbalized understanding agrees  with plan.  Further recommendations to follow as clinical indicated Disposition Plan:   Patient is from:  Home  Anticipated DC to:  To be determined  Anticipated DC date:  Anticipate less than 2 midnight stay, may need longer stay depending on clinical course  Time spent on admission:       55 minutes  Consults called:  Orthopedic surgery consulted by ER physician and will see pt in am  Admission status:  Observation   Eben Burow MD [facility]  How to contact the [facility] Attending or Consulting provider [place] or covering provider during after hours [place], for this patient?   Check the care team in [facility] and look for a) attending/consulting [facility] provider listed and b) the [facility] team listed Log into [email or web address] and use [place]'s universal password to access. If you do not have the password, please contact the hospital operator. Locate the [facility] provider you are looking for under [facility] and page to a number that you can be directly reached. If you still have difficulty reaching the provider, please page the [facility] (Director on Call) for the Hospitalists listed on amion for assistance.  01/29/2021, 9:32 PM

## 2021-01-29 NOTE — ED Triage Notes (Signed)
Per ems patient complains of bilateral lower extremity pain and relief with hydrocodone. Now pain returned because out of med

## 2021-01-29 NOTE — ED Provider Notes (Signed)
[facility] EMERGENCY DEPARTMENT Provider Note   CSN: [id number] Arrival date & time: 01/29/21  1051     History  Chief Complaint  Patient presents with   Weakness    Angel Cooper is a 88 y.o. male with PMH A. fib, CHF, CKD, HTN, HLD, gout, ureteral carcinoma status post nephrectomy, CAD status post CABG who presents emergency department for evaluation of bilateral knee pain.  Patient states that he has a history of gout and that his gout has been progressively worsening.  States that it is acutely worsened over the last 3 days and he is currently unable to walk.  Denies fever, chest pain, shortness of breath, nausea, vomiting or other systemic symptoms.   Weakness Associated symptoms: arthralgias       Past Medical History:  Diagnosis Date   Atrial fibrillation ([place])    2015  W/ RVR   Bladder tumor    CHF (congestive heart failure) ([place]) 2015   CKD (chronic kidney disease), stage III (HCC)    Coronary artery disease    NO LONGER SEES CARDIOLOGIST - LAST SAW DR. 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 Authorizing Provider  acetaminophen (TYLENOL) 500 MG tablet Take 1,000 mg by mouth every 6 (six) hours as needed for mild pain or headache.     [provider]   ALPRAZolam Duanne Moron) 0.25 MG tablet Take 0.25 mg by mouth at bedtime as needed for sleep.  11/19/18   [provider]  amLODipine (NORVASC) 5 MG tablet Take 5 mg by mouth every morning.    [provider]  colchicine 0.6 MG tablet Take 1 tablet (0.6 mg total) by mouth daily for 7 days. 12/08/18 12/15/18  [place] Donnamarie Poag, DO  Cyanocobalamin (B-12 COMPLIANCE INJECTION) 1000 MCG/ML KIT Inject 1,000 mcg as directed every 30 (thirty) days.    [provider]  furosemide (LASIX) 40 MG tablet Take 80 mg by mouth every morning.     [provider]  HYDROcodone-acetaminophen (NORCO/VICODIN) 5-325 MG tablet Take 1 tablet by mouth every 4 (four) hours as needed. Patient taking differently: Take 1 tablet by mouth every 4 (four) hours as needed (for pain).  04/17/18   Isla Pence, MD  omeprazole (PRILOSEC) 20 MG capsule Take 20 mg by mouth every morning.  11/16/18   [provider]  psyllium (METAMUCIL) 58.6 % powder Take 1 packet by mouth daily as needed (constipation).    [provider]  traMADol (ULTRAM) 50 MG tablet Take 1-2 tablets (50-100 mg total) by mouth every 6 (six) hours as needed. Patient taking differently: Take 50 mg by mouth every 6 (six) hours as needed (for  pain).  01/31/16   [name], MD  triamcinolone cream (KENALOG) 0.1 % Apply 1 application topically See admin instructions. Apply to affected area(s) up to 2 times a day as needed for itching and AVOID UNDERARMS, FACE, and GROIN AREA 08/19/18   [provider]      Allergies    Aspirin, Benadryl [diphenhydramine hcl (sleep)], Chlorhexidine, Enalapril, and Gabapentin    Review of Systems   Review of Systems  Musculoskeletal:  Positive for arthralgias.  Neurological:  Positive for weakness.   Physical Exam Updated Vital Signs BP 128/84 (BP Location: Left Arm)    Pulse 96    Temp 98.2 F (36.8 C) (Oral)    Resp 20    Ht 5' 4"  (1.626 m)    Wt 52.2 kg    SpO2 99%     BMI 19.74 kg/m  Physical Exam Vitals and nursing note reviewed.  Constitutional:      General: He is not in acute distress.    Appearance: He is well-developed.  HENT:     Head: Normocephalic and atraumatic.  Eyes:     Conjunctiva/sclera: Conjunctivae normal.  Cardiovascular:     Rate and Rhythm: Normal rate and regular rhythm.     Heart sounds: No murmur heard. Pulmonary:     Effort: Pulmonary effort is normal. No respiratory distress.     Breath sounds: Normal breath sounds.  Abdominal:     Palpations: Abdomen is soft.     Tenderness: There is no abdominal tenderness.  Musculoskeletal:        General: Swelling (Bilateral knees, no overlying erythema or appreciable joint effusion) and tenderness present.     Cervical back: Neck supple.  Skin:    General: Skin is warm and dry.     Capillary Refill: Capillary refill takes less than 2 seconds.  Neurological:     Mental Status: He is alert.  Psychiatric:        Mood and Affect: Mood normal.    ED Results / Procedures / Treatments   Labs (all labs ordered are listed, but only abnormal results are displayed) Labs Reviewed  CBC WITH DIFFERENTIAL/PLATELET - Abnormal; Notable for the following components:      Result Value   WBC 16.6 (*)    RBC 3.36 (*)    Hemoglobin 9.9 (*)    HCT 30.0 (*)    RDW 15.7 (*)    Platelets 564 (*)    Neutro Abs 13.7 (*)    Monocytes Absolute 1.4 (*)    Abs Immature Granulocytes 0.08 (*)    All other components within normal limits  COMPREHENSIVE METABOLIC PANEL - Abnormal; Notable for the following components:   Sodium 133 (*)    Chloride 94 (*)    Glucose, Bld 121 (*)    BUN 25 (*)    Creatinine, Ser 1.57 (*)    Calcium 8.6 (*)    Albumin 3.2 (*)    GFR, Estimated 42 (*)    All other components within normal limits  CK  URINALYSIS, ROUTINE W REFLEX MICROSCOPIC    EKG None  Radiology DG Knee Complete 4 Views Left  Result Date: 01/29/2021 CLINICAL DATA:  Left knee pain and  swelling EXAM: LEFT KNEE - COMPLETE 4+ VIEW COMPARISON:  09/26/2016 FINDINGS: Negative for fracture or bone lesion. Small to moderate joint effusion. Advanced degenerative change in the lateral joint space with joint space narrowing and spurring. Moderate to advanced degenerative change in the patellofemoral joint. Mild  degenerative spurring medially. Overall no change from prior study IMPRESSION: Advanced degenerative change with joint effusion. No acute abnormality no change from 2018. Electronically Signed   By: Franchot Gallo M.D.   On: 01/29/2021 12:31   DG Knee Complete 4 Views Right  Result Date: 01/29/2021 CLINICAL DATA:  Chronic knee pain bilaterally EXAM: RIGHT KNEE - COMPLETE 4+ VIEW COMPARISON:  None. FINDINGS: Negative for fracture.  Moderate joint effusion Tricompartmental degenerative change most severe in the medial joint space. No focal bone lesion. Atherosclerotic calcification IMPRESSION: Moderate to advanced tricompartmental degenerative change. No acute abnormality Electronically Signed   By: Franchot Gallo M.D.   On: 01/29/2021 12:44    Procedures Procedures  I reviewed the cardiac monitor.  Normal sinus rhythm.  No ST elevations or arrhythmia.  Medications Ordered in ED Medications - No data to display  ED Course/ Medical Decision Making/ A&P                           Medical Decision Making  Patient seen emergency department for evaluation of bilateral knee pain.  Physical exam reveals tenderness to bilateral knees with pain on extension.  They are warm to the touch but have no overlying erythema and the patient is afebrile with no appreciable joint effusion and thus my concern for septic joint is lower.  However, patient does have a significant leukocytosis to 16.6, hemoglobin 9.9, thrombocytosis to 564.  Sed rate elevated to 85, CRP elevated to 20.  Creatinine 1.57, BUN 25, sodium 133.  I spoke with orthopedics about the possibility of septic joint and orthopedics is  recommending medical admission and they will likely perform a tap tomorrow morning.  We both agree that bilateral septic knees is unlikely and the sed rate elevations can be seen in the setting of acute gout flare.  He was given morphine and Toradol and will be admitted to medicine for pain control and further observation.  Patient then admitted.        Final Clinical Impression(s) / ED Diagnoses Final diagnoses:  None    Rx / DC Orders ED Discharge Orders     None         Maryori Weide, MD 01/30/21 0005

## 2021-01-29 NOTE — ED Triage Notes (Signed)
Pt BIB [facility] from home c/o of bilateral leg weakness. Pt states he normally walks with a walker but for the last 3 weeks he hasn't been able to. Per family it has gotten worse over the last week.

## 2021-01-30 ENCOUNTER — Encounter (HOSPITAL_COMMUNITY): Payer: Self-pay | Admitting: Family Medicine

## 2021-01-30 DIAGNOSIS — J9601 Acute respiratory failure with hypoxia: Secondary | ICD-10-CM | POA: Diagnosis not present

## 2021-01-30 DIAGNOSIS — N1832 Chronic kidney disease, stage 3b: Secondary | ICD-10-CM | POA: Diagnosis not present

## 2021-01-30 DIAGNOSIS — M25562 Pain in left knee: Secondary | ICD-10-CM | POA: Diagnosis not present

## 2021-01-30 DIAGNOSIS — M25561 Pain in right knee: Secondary | ICD-10-CM | POA: Diagnosis not present

## 2021-01-30 DIAGNOSIS — M109 Gout, unspecified: Secondary | ICD-10-CM | POA: Diagnosis not present

## 2021-01-30 DIAGNOSIS — D72829 Elevated white blood cell count, unspecified: Secondary | ICD-10-CM

## 2021-01-30 DIAGNOSIS — G8929 Other chronic pain: Secondary | ICD-10-CM | POA: Diagnosis not present

## 2021-01-30 LAB — CBC
HCT: 25.6 % — ABNORMAL LOW (ref 39.0–52.0)
Hemoglobin: 8.7 g/dL — ABNORMAL LOW (ref 13.0–17.0)
MCH: 30.3 pg (ref 26.0–34.0)
MCHC: 34 g/dL (ref 30.0–36.0)
MCV: 89.2 fL (ref 80.0–100.0)
Platelets: 454 10*3/uL — ABNORMAL HIGH (ref 150–400)
RBC: 2.87 MIL/uL — ABNORMAL LOW (ref 4.22–5.81)
RDW: 15.5 % (ref 11.5–15.5)
WBC: 15.7 10*3/uL — ABNORMAL HIGH (ref 4.0–10.5)
nRBC: 0 % (ref 0.0–0.2)

## 2021-01-30 LAB — SYNOVIAL CELL COUNT + DIFF, W/ CRYSTALS
Eosinophils-Synovial: 0 % (ref 0–1)
Eosinophils-Synovial: 0 % (ref 0–1)
Lymphocytes-Synovial Fld: 0 % (ref 0–20)
Lymphocytes-Synovial Fld: 1 % (ref 0–20)
Monocyte-Macrophage-Synovial Fluid: 5 % — ABNORMAL LOW (ref 50–90)
Monocyte-Macrophage-Synovial Fluid: 6 % — ABNORMAL LOW (ref 50–90)
Neutrophil, Synovial: 94 % — ABNORMAL HIGH (ref 0–25)
Neutrophil, Synovial: 94 % — ABNORMAL HIGH (ref 0–25)
WBC, Synovial: 15875 /mm3 — ABNORMAL HIGH (ref 0–200)
WBC, Synovial: 2200 /mm3 — ABNORMAL HIGH (ref 0–200)

## 2021-01-30 LAB — BASIC METABOLIC PANEL
Anion gap: 10 (ref 5–15)
BUN: 25 mg/dL — ABNORMAL HIGH (ref 8–23)
CO2: 26 mmol/L (ref 22–32)
Calcium: 8.4 mg/dL — ABNORMAL LOW (ref 8.9–10.3)
Chloride: 98 mmol/L (ref 98–111)
Creatinine, Ser: 1.59 mg/dL — ABNORMAL HIGH (ref 0.61–1.24)
GFR, Estimated: 41 mL/min — ABNORMAL LOW (ref >60)
Glucose, Bld: 106 mg/dL — ABNORMAL HIGH (ref 70–99)
Potassium: 3.8 mmol/L (ref 3.5–5.1)
Sodium: 134 mmol/L — ABNORMAL LOW (ref 135–145)

## 2021-01-30 MED ORDER — AMLODIPINE BESYLATE 5 MG PO TABS
5.0000 mg | ORAL_TABLET | Freq: Every morning | ORAL | Status: DC
  Administered 2021-01-30 – 2021-02-02 (×4): 5 mg via ORAL
  Filled 2021-01-30 (×4): qty 1

## 2021-01-30 MED ORDER — COLCHICINE 0.6 MG PO TABS
0.6000 mg | ORAL_TABLET | Freq: Two times a day (BID) | ORAL | Status: AC
  Administered 2021-01-30 – 2021-01-31 (×4): 0.6 mg via ORAL
  Filled 2021-01-30 (×5): qty 1

## 2021-01-30 MED ORDER — ALLOPURINOL 100 MG PO TABS
50.0000 mg | ORAL_TABLET | Freq: Every day | ORAL | Status: DC
  Administered 2021-01-31 – 2021-02-02 (×3): 50 mg via ORAL
  Filled 2021-01-30 (×3): qty 1

## 2021-01-30 MED ORDER — SENNOSIDES-DOCUSATE SODIUM 8.6-50 MG PO TABS
1.0000 | ORAL_TABLET | Freq: Two times a day (BID) | ORAL | Status: DC
  Administered 2021-01-30 – 2021-02-01 (×5): 1 via ORAL
  Filled 2021-01-30 (×5): qty 1

## 2021-01-30 MED ORDER — HYDROCODONE-ACETAMINOPHEN 5-325 MG PO TABS
1.0000 | ORAL_TABLET | ORAL | Status: DC | PRN
  Administered 2021-01-30 – 2021-02-02 (×6): 1 via ORAL
  Filled 2021-01-30 (×6): qty 1

## 2021-01-30 MED ORDER — ALLOPURINOL 300 MG PO TABS
300.0000 mg | ORAL_TABLET | Freq: Every day | ORAL | Status: DC
  Administered 2021-01-30: 300 mg via ORAL
  Filled 2021-01-30: qty 1

## 2021-01-30 MED ORDER — ALPRAZOLAM 0.25 MG PO TABS
0.2500 mg | ORAL_TABLET | Freq: Every evening | ORAL | Status: DC | PRN
  Administered 2021-01-31 – 2021-02-01 (×2): 0.25 mg via ORAL
  Filled 2021-01-30 (×2): qty 1

## 2021-01-30 MED ORDER — ENSURE ENLIVE PO LIQD
237.0000 mL | Freq: Two times a day (BID) | ORAL | Status: DC
  Administered 2021-01-30 – 2021-02-02 (×6): 237 mL via ORAL
  Filled 2021-01-30: qty 237

## 2021-01-30 MED ORDER — SENNOSIDES-DOCUSATE SODIUM 8.6-50 MG PO TABS: 1.0000 | ORAL_TABLET | Freq: Every evening | ORAL | Status: DC | PRN

## 2021-01-30 MED ORDER — BUPIVACAINE HCL (PF) 0.5 % IJ SOLN
10.0000 mL | Freq: Once | INTRAMUSCULAR | Status: AC
  Administered 2021-01-30: 10 mL
  Filled 2021-01-30: qty 10

## 2021-01-30 MED ORDER — POLYETHYLENE GLYCOL 3350 17 G PO PACK
17.0000 g | PACK | Freq: Every day | ORAL | Status: DC
  Administered 2021-01-30 – 2021-02-02 (×4): 17 g via ORAL
  Filled 2021-01-30 (×4): qty 1

## 2021-01-30 MED ORDER — ONDANSETRON HCL 4 MG/2ML IJ SOLN: 4.0000 mg | Freq: Four times a day (QID) | INTRAMUSCULAR | Status: DC | PRN

## 2021-01-30 MED ORDER — ENOXAPARIN SODIUM 30 MG/0.3ML IJ SOSY
30.0000 mg | PREFILLED_SYRINGE | INTRAMUSCULAR | Status: DC
  Administered 2021-01-30 – 2021-02-02 (×4): 30 mg via SUBCUTANEOUS
  Filled 2021-01-30 (×4): qty 0.3

## 2021-01-30 MED ORDER — ONDANSETRON HCL 4 MG PO TABS
4.0000 mg | ORAL_TABLET | Freq: Four times a day (QID) | ORAL | Status: DC | PRN
Start: 2021-01-30 — End: 2021-02-02

## 2021-01-30 MED ORDER — LACTATED RINGERS IV SOLN: INTRAVENOUS | Status: DC

## 2021-01-30 MED ORDER — KETOROLAC TROMETHAMINE 15 MG/ML IJ SOLN: 15.0000 mg | Freq: Four times a day (QID) | INTRAMUSCULAR | Status: DC | PRN

## 2021-01-30 MED ORDER — ACETAMINOPHEN 650 MG RE SUPP: 650.0000 mg | Freq: Four times a day (QID) | RECTAL | Status: DC | PRN

## 2021-01-30 MED ORDER — METHYLPREDNISOLONE ACETATE 40 MG/ML IJ SUSP
80.0000 mg | Freq: Once | INTRAMUSCULAR | Status: AC
  Administered 2021-01-30: 80 mg via INTRA_ARTICULAR
  Filled 2021-01-30: qty 2

## 2021-01-30 MED ORDER — HYDROMORPHONE HCL 1 MG/ML IJ SOLN: 0.5000 mg | INTRAMUSCULAR | Status: DC | PRN

## 2021-01-30 MED ORDER — ACETAMINOPHEN 325 MG PO TABS: 650.0000 mg | ORAL_TABLET | Freq: Four times a day (QID) | ORAL | Status: DC | PRN

## 2021-01-30 NOTE — Progress Notes (Signed)
°  Transition of Care [facility]) Screening Note   Patient Details  Name: Angel Cooper Date of Birth: Sep 17, 1930   Transition of Care [facility]) CM/SW Contact:    Sharin Mons, RN Phone Number: [phone] 01/30/2021, 10:23 AM   Presents with bilateral knee pain. Under OBS services. From home with wife. States wife currently is a pt in the hospital on 6N. Supportive sons. PTA independent with ADL's.  Owns rolator, RW, 3in 1/bsc  and can. PCP: Dr. [name] Transition of Care Department [facility]) has reviewed patient . Consult received for HH/DME needs. PT evaluation pending....  TOC will continue to monitor for needs.   Whitman Hero RN,BSN,CM

## 2021-01-30 NOTE — Assessment & Plan Note (Signed)
-  patient has history of CKD3b. Baseline creat ~ 1.5, eGFR 40 - renally adjust meds as needed

## 2021-01-30 NOTE — Procedures (Signed)
Procedure: Bilateral knee aspiration and injection   Indication: Bilateral knee effusion(s)   Surgeon: Silvestre Gunner, PA-C   Assist: None   Anesthesia: Topical anesthetic   EBL: None   Complications: None   Findings: After risks/benefits explained patient desires to undergo procedure. Consent obtained and time out performed. The bilateral knees were sterilely prepped and aspirated. 63ml dark yellow fluid obtained from the right with some tophaceous material. 40mg  depomedrol and 84ml 0.5% Marcaine instilled. 73ml clear lighter yellow fluid obtained from the right, 67ml 0.5% Marcaine and 40mg  depomedrol instilled. Pt tolerated the procedure well.       Lisette Abu, PA-C Orthopedic Surgery [phone]

## 2021-01-30 NOTE — Consult Note (Signed)
Reason for Consult:Bilateral knee pain Referring Physician: [name] Time called: [id number] Time at bedside: Atkins is an 88 y.o. male.  HPI: Angel Cooper was admitted with progressive bilateral knee pain. He has had knee pain on and off for years but it has gotten so bad in the last few days he has been unable to stand or ambulate. He has a history of gout and says this feels similar though more severe. He denies fevers, chills, sweats, N/V, or trauma. Orthopedic was consulted for arthrocentesis to aid diagnosis and provide therapeutic relief.  Past Medical History:  Diagnosis Date   Atrial fibrillation ([place])    2015  W/ RVR   Bladder tumor    CHF (congestive heart failure) ([place]) 2015   CKD (chronic kidney disease), stage III ([facility])    Coronary artery disease    NO LONGER SEES CARDIOLOGIST - LAST SAW DR. 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[facility]) urologist-  dr herrick/  oncologist- dr [name]   dx 2014  s/p  right nephroureterectomy 01-24-2013 (T3, N0) invasive high grade urothelial carcinoma--     Past Surgical History:  Procedure Laterality Date   AMPUTATION FINGER Left 12/07/2018   Procedure: Amputation Left Long Finger;  Surgeon: Roseanne Kaufman, MD;  Location: [employer];  Service: Orthopedics;  Laterality: Left;   CARDIAC CATHETERIZATION  07/05/1999   non-q MI;  3vessel CAD w/ high grade pLAD and mLAD, subtotal ramus branch,  OM1 70-80%,  ostialCFX 50-60%,  dCFx 80%,  RI 99%,  total occluded pRCA and fills last by right-to-right  collaterals,  pLM  20% narrowing;  severe inferoapical hypokinesis   CATARACT EXTRACTION W/ INTRAOCULAR LENS  IMPLANT, BILATERAL     CORONARY ARTERY BYPASS GRAFT  06/ 2001  dr [name] tright   CYSTOSCOPY W/ RETROGRADES Left 01/31/2016   Procedure: CYSTOSCOPY WITH RETROGRADE PYELOGRAM;  Surgeon: [name], MD;  Location: [facility];  Service: Urology;  Laterality: Left;   CYSTOSCOPY WITH RETROGRADE PYELOGRAM, URETEROSCOPY AND STENT PLACEMENT Right 12/03/2012   Procedure: CYSTOSCOPY WITH RIGHT RETROGRADE PYELOGRAM,RIGHT URETERAL WASHINGS, RIGHT URETERAL BIOPSY, RIGHT URETEROSCOPY AND STENT PLACEMENT;  Surgeon: [name], MD;  Location: [facility];  Service: Urology;  Laterality: Right;   CYSTOSCOPY WITH RETROGRADE PYELOGRAM, URETEROSCOPY AND STENT PLACEMENT Right 12/15/2012   Procedure: CYSTOSCOPY WITH RIGHT RETROGRADE PYELOGRAM, POSSIBLE RIGHT URETEROSCOPY AND STENT PLACEMENT AND REMOVAL OF LEFT STENT;  Surgeon: [name], MD;  Location: [facility];  Service: Urology;  Laterality: Right;   CYSTOSCOPY WITH STENT PLACEMENT Right 01/24/2013   Procedure: CYSTOSCOPY WITH STENT PLACEMENT;  Surgeon: [name], MD;  Location: [facility];  Service: Urology;  Laterality: Right;   I & D EXTREMITY Left 12/07/2018   Procedure: IRRIGATION AND DEBRIDEMENT EXTREMITY;  Surgeon: Roseanne Kaufman, MD;  Location: [place];  Service: Orthopedics;  Laterality: Left;   INCISION AND DRAINAGE ABSCESS Left 06/02/2016  Procedure: INCISION AND DRAINAGE LEFT SMALL FINGER;  Surgeon: [name], MD;  Location: [place];  Service: Orthopedics;  Laterality: Left;   ROBOT ASSITED LAPAROSCOPIC NEPHROURETERECTOMY Right 01/24/2013   Procedure: ROBOT ASSISTED LAPAROSCOPIC NEPHROURETERECTOMY WITH BILATERAL LYMPH NODE DISSECTION ;  Surgeon: [name], MD;  Location: [facility];  Service: Urology;  Laterality: Right;  right kidney   TRANSTHORACIC ECHOCARDIOGRAM  01/28/2013   mild LVH,  ef  45-50%, diffuse hypokinesis,  grade 1 diastolic dysfunction/  moderate LAE/  trivial PR/  mild TR, PASP 50-19mmHg   TRANSURETHRAL RESECTION OF BLADDER TUMOR N/A 01/31/2016   Procedure: TRANSURETHRAL RESECTION OF BLADDER TUMOR (TURBT);  Surgeon: [name], MD;  Location[facility];  Service: Urology;  Laterality: N/A;    History reviewed. No pertinent family history.  Social History:  reports that he quit smoking about 63 years ago. His smoking use included cigarettes. He has never used smokeless tobacco. He reports that he does not currently use alcohol. He reports that he does not use drugs.  Allergies:  Allergies  Allergen Reactions   Allopurinol     Other reaction(s): myalgias/diarhea   Aspirin Other (See Comments)    Stomach pain    Benadryl [Diphenhydramine Hcl (Sleep)] Hives   Chlorhexidine Other (See Comments)    CAUSED SKIN IRRITATION AND BURNING SENSATION   Enalapril Other (See Comments)    Cannot take due to kidney compromise   Gabapentin Other (See Comments)    Caused nightmares    Medications: I have reviewed the patient's current medications.  Results for orders placed or performed during the hospital encounter of 01/29/21 (from the past 48 hour(s))  Urinalysis, Routine w reflex microscopic Urine, Clean Catch     Status: None   Collection Time: 01/29/21 11:50 AM  Result Value Ref Range   Color, Urine YELLOW YELLOW   APPearance CLEAR CLEAR   Specific Gravity, Urine 1.015 1.005 - 1.030   pH 5.5 5.0 - 8.0   Glucose, UA NEGATIVE NEGATIVE mg/dL   Hgb urine dipstick NEGATIVE NEGATIVE   Bilirubin Urine NEGATIVE NEGATIVE   Ketones, ur NEGATIVE NEGATIVE mg/dL   Protein, ur NEGATIVE NEGATIVE mg/dL   Nitrite NEGATIVE NEGATIVE   Leukocytes,Ua NEGATIVE NEGATIVE    Comment: Microscopic not done on urines with negative protein, blood, leukocytes, nitrite, or glucose < 500 mg/dL. Performed at [facility], [place] [address], [place], [place]  [address]   CK     Status: None   Collection Time: 01/29/21 11:59 AM  Result Value Ref Range   Total CK 198 49 - 397 U/L    Comment: Performed at [facility], [place] [address] [place], [place] [address]  CBC with Differential     Status: Abnormal   Collection Time: 01/29/21 11:59 AM  Result Value Ref Range   WBC 16.6 (H) 4.0 - 10.5 K/uL   RBC 3.36 (L) 4.22 - 5.81 MIL/uL   Hemoglobin 9.9 (L) 13.0 - 17.0 g/dL   HCT 30.0 (L) 39.0 - 52.0 %   MCV 89.3 80.0 - 100.0 fL   MCH 29.5 26.0 - 34.0 pg   MCHC 33.0 30.0 - 36.0 g/dL   RDW 15.7 (H) 11.5 - 15.5 %   Platelets 564 (H) 150 - 400 K/uL   nRBC 0.0 0.0 - 0.2 %   Neutrophils Relative % 83 %   Neutro Abs 13.7 (H) 1.7 - 7.7 K/uL   Lymphocytes Relative 7 %  Lymphs Abs 1.2 0.7 - 4.0 K/uL   Monocytes Relative 8 %   Monocytes Absolute 1.4 (H) 0.1 - 1.0 K/uL   Eosinophils Relative 1 %   Eosinophils Absolute 0.1 0.0 - 0.5 K/uL   Basophils Relative 0 %   Basophils Absolute 0.1 0.0 - 0.1 K/uL   Immature Granulocytes 1 %   Abs Immature Granulocytes 0.08 (H) 0.00 - 0.07 K/uL    Comment: Performed at [facility] [address], [place], [place] [address]  Comprehensive metabolic panel     Status: Abnormal   Collection Time: 01/29/21 11:59 AM  Result Value Ref Range   Sodium 133 (L) 135 - 145 mmol/L   Potassium 3.5 3.5 - 5.1 mmol/L   Chloride 94 (L) 98 - 111 mmol/L   CO2 26 22 - 32 mmol/L   Glucose, Bld 121 (H) 70 - 99 mg/dL    Comment: Glucose reference range applies only to samples taken after fasting for at least 8 hours.   BUN 25 (H) 8 - 23 mg/dL   Creatinine, Ser 1.57 (H) 0.61 - 1.24 mg/dL   Calcium 8.6 (L) 8.9 - 10.3 mg/dL   Total Protein 6.8 6.5 - 8.1 g/dL   Albumin 3.2 (L) 3.5 - 5.0 g/dL   AST 26 15 - 41 U/L   ALT 11 0 - 44 U/L   Alkaline Phosphatase 68 38 - 126 U/L   Total Bilirubin 0.8 0.3 - 1.2 mg/dL   GFR, Estimated 42 (L) >60 mL/min    Comment: (NOTE) Calculated using the CKD-EPI Creatinine Equation (2021)     Anion gap 13 5 - 15    Comment: Performed at [facility], [place] [address], [place], [place] [address]  Sedimentation rate     Status: Abnormal   Collection Time: 01/29/21  9:00 PM  Result Value Ref Range   Sed Rate 85 (H) 0 - 16 mm/hr    Comment: Performed at [facility] [address] [place], [place] [address]  C-reactive protein     Status: Abnormal   Collection Time: 01/29/21  9:00 PM  Result Value Ref Range   CRP 20.0 (H) <1.0 mg/dL    Comment: Performed at [facility], [place] [address] [place], [place] [address]  Uric acid     Status: None   Collection Time: 01/29/21  9:00 PM  Result Value Ref Range   Uric Acid, Serum 8.1 3.7 - 8.6 mg/dL    Comment: Performed at [facility], [place] [address] [place], [place] [address]  Basic metabolic panel     Status: Abnormal   Collection Time: 01/30/21  2:27 AM  Result Value Ref Range   Sodium 134 (L) 135 - 145 mmol/L   Potassium 3.8 3.5 - 5.1 mmol/L   Chloride 98 98 - 111 mmol/L   CO2 26 22 - 32 mmol/L   Glucose, Bld 106 (H) 70 - 99 mg/dL    Comment: Glucose reference range applies only to samples taken after fasting for at least 8 hours.   BUN 25 (H) 8 - 23 mg/dL   Creatinine, Ser 1.59 (H) 0.61 - 1.24 mg/dL   Calcium 8.4 (L) 8.9 - 10.3 mg/dL   GFR, Estimated 41 (L) >60 mL/min    Comment: (NOTE) Calculated using the CKD-EPI Creatinine Equation (2021)    Anion gap 10 5 - 15    Comment: Performed at [place],  [place] [address]  CBC     Status: Abnormal   Collection Time: 01/30/21  2:27 AM  Result Value Ref Range   WBC 15.7 (H) 4.0 - 10.5 K/uL   RBC 2.87 (L) 4.22 - 5.81 MIL/uL   Hemoglobin 8.7 (L) 13.0 - 17.0 g/dL   HCT 25.6 (L) 39.0 - 52.0 %   MCV 89.2 80.0 - 100.0 fL   MCH 30.3 26.0 - 34.0 pg   MCHC 34.0 30.0 - 36.0 g/dL   RDW 15.5 11.5 - 15.5 %   Platelets 454 (H) 150 - 400 K/uL   nRBC 0.0 0.0 - 0.2 %    Comment: Performed at [facility], [place] [address], [place], [place] [address]    DG Knee Complete 4 Views Left  Result Date: 01/29/2021 CLINICAL DATA:  Left knee pain and swelling EXAM: LEFT KNEE - COMPLETE 4+ VIEW COMPARISON:  09/26/2016 FINDINGS: Negative for fracture or bone lesion. Small to moderate joint effusion. Advanced degenerative change in the lateral joint space with joint space narrowing and spurring. Moderate to advanced degenerative change in the patellofemoral joint. Mild degenerative spurring medially. Overall no change from prior study IMPRESSION: Advanced degenerative change with joint effusion. No acute abnormality no change from 2018. Electronically Signed   By: Franchot Gallo M.D.   On: 01/29/2021 12:31   DG Knee Complete 4 Views Right  Result Date: 01/29/2021 CLINICAL DATA:  Chronic knee pain bilaterally EXAM: RIGHT KNEE - COMPLETE 4+ VIEW COMPARISON:  None. FINDINGS: Negative for fracture.  Moderate joint effusion Tricompartmental degenerative change most severe in the medial joint space. No focal bone lesion. Atherosclerotic calcification IMPRESSION: Moderate to advanced tricompartmental degenerative change. No acute abnormality Electronically Signed   By: Franchot Gallo M.D.   On: 01/29/2021 12:44    Review of Systems  HENT:  Negative for ear discharge, ear pain, hearing loss and tinnitus.   Eyes:  Negative for photophobia and pain.  Respiratory:  Negative for cough and shortness of breath.   Cardiovascular:  Negative for chest pain.  Gastrointestinal:  Negative for abdominal pain, nausea and vomiting.  Genitourinary:  Negative for dysuria, flank pain, frequency and urgency.  Musculoskeletal:  Positive for arthralgias (Bilateral knees). Negative for back pain, myalgias and neck pain.  Neurological:  Negative for dizziness and headaches.  Hematological:  Does not bruise/bleed easily.  Psychiatric/Behavioral:  The patient is not nervous/anxious.   Blood pressure 116/85, pulse 90, temperature 97.8 F (36.6 C), temperature  source Oral, resp. rate 16, height 5\' 4"  (1.626 m), weight 52.2 kg, SpO2 96 %. Physical Exam Constitutional:      General: He is not in acute distress.    Appearance: He is well-developed. He is not diaphoretic.  HENT:     Head: Normocephalic and atraumatic.  Eyes:     General: No scleral icterus.       Right eye: No discharge.        Left eye: No discharge.     Conjunctiva/sclera: Conjunctivae normal.  Cardiovascular:     Rate and Rhythm: Normal rate and regular rhythm.  Pulmonary:     Effort: Pulmonary effort is normal. No respiratory distress.  Musculoskeletal:     Cervical back: Normal range of motion.     Comments: BLE No traumatic wounds, ecchymosis, or rash  Mod TTP bilateral knees  Mild knee effusion left, mod knee effusion right  Knee stable to varus/ valgus and anterior/posterior stress  Sens DPN, SPN, TN intact  Motor  EHL, ext, flex, evers 5/5  DP 2+, PT 2+, No significant edema  Skin:    General: Skin is warm and dry.  Neurological:     Mental Status: He is alert.  Psychiatric:        Mood and Affect: Mood normal.        Behavior: Behavior normal.    Assessment/Plan: Bilateral knee pain -- Knees aspirated and injected. Suspect gout. Fluid sent for analysis. No restrictions on movement or WB.    Lisette Abu, PA-C Orthopedic Surgery [phone] 01/30/2021, 12:52 PM

## 2021-01-30 NOTE — Assessment & Plan Note (Addendum)
-   Knee x-rays on admission showed moderate to advanced tricompartmental degenerative changes -Evaluated by orthopedic surgery after admission and underwent bilateral arthrocentesis - results seem consistent with gout/inflammation and noted to have intracellular monosodium urate crystals on analysis as well; he underwent steroid injection with Depo-Medrol after arthrocentesis and fluid analysis -Follow-up cultures, NGTD -Continue Toradol as needed as well -Uric acid level 8.1 on admission.  Allopurinol dose renally adjusted -Mobility slowly improved during hospitalization and he was felt stable and safe for discharging home with home health in place

## 2021-01-30 NOTE — Assessment & Plan Note (Signed)
-   Continue amlodipine ?

## 2021-01-30 NOTE — Assessment & Plan Note (Addendum)
-   Presumed reactive in setting of gout flare - Follow-up arthrocentesis culture, still negative to date

## 2021-01-30 NOTE — Progress Notes (Signed)
Progress Note    Angel Cooper   MGQ:[id number]  DOB: [birth year]  DOA: 01/29/2021     0 PCP: Angel Orn, MD  Initial CC: knee pain  Hospital Course: Angel Cooper is an 88 year old male with PMH gout, PAF, CAD s/p CABG, CHF, CKD, GERD, HLD, HTN who presented with worsening bilateral knee pain and swelling.  Symptoms have been progressively worsening for the past several weeks prior to admission.  He was recently increased to 300 mg daily of allopurinol.  He has also undergone prior steroid injection to his knee as well as a prednisone course for presumed gout flare within the past 2 months.  He endorsed not having much relief from prior steroid injection. He was admitted for further work-up of his knee pain and swelling.  Interval History:  Seen this morning with son present bedside.  Still complaining of ongoing knee pain with notable swelling bilaterally.  Plan was for undergoing arthrocentesis today with orthopedic surgery and further plan to be determined after that.  Assessment & Plan: * Knee pain, bilateral- (present on admission) - Knee x-rays on admission showed moderate to advanced tricompartmental degenerative changes - continue ongoing pain control. Appears to have had relief from toradol as well on admission  -Evaluated by PT with recommendations for home health  Gouty arthritis- (present on admission) - Knee x-rays on admission showed moderate to advanced tricompartmental degenerative changes -Evaluated by orthopedic surgery after admission and underwent bilateral arthrocentesis - results seem consistent with gout/inflammation and noted to have intracellular monosodium urate crystals on analysis as well; he underwent steroid injection with Depo-Medrol after arthrocentesis and fluid analysis -Follow-up cultures -Continue Toradol as needed as well -Uric acid level 8.1 on admission.  Allopurinol dose renally adjusted  Leukocytosis- (present on admission) - Presumed  reactive in setting of gout flare - Follow-up arthrocentesis culture  Essential hypertension- (present on admission) - Continue amlodipine  Chronic kidney disease, stage 3b (Angel Cooper)- (present on admission) - patient has history of CKD3b. Baseline creat ~ 1.5, eGFR 40 - renally adjust meds as needed     Old records reviewed in assessment of this patient  Antimicrobials:   DVT prophylaxis: Lovenox  Code Status:   Code Status: Full Code  Disposition Plan: Home with home health Status is: Observation  Objective: Blood pressure 116/85, pulse 90, temperature 97.8 F (36.6 C), resp. rate 16, height _0  (1.626 m), weight 52.2 kg, SpO2 96 %.  Examination:  Physical Exam Constitutional:      General: He is not in acute distress.    Appearance: Normal appearance.  HENT:     Head: Normocephalic and atraumatic.     Mouth/Throat:     Mouth: Mucous membranes are moist.  Eyes:     Extraocular Movements: Extraocular movements intact.  Cardiovascular:     Rate and Rhythm: Normal rate and regular rhythm.     Heart sounds: Normal heart sounds.  Pulmonary:     Effort: Pulmonary effort is normal. No respiratory distress.     Breath sounds: Normal breath sounds. No wheezing.  Abdominal:     General: Bowel sounds are normal. There is no distension.     Palpations: Abdomen is soft.     Tenderness: There is no abdominal tenderness.  Musculoskeletal:     Cervical back: Normal range of motion and neck supple.     Comments: Bilateral knee swelling noted with decreased active ROM due to swelling and pain.  No significant erythema or calor appreciated  Skin:  General: Skin is warm and dry.  Neurological:     General: No focal deficit present.     Mental Status: He is alert.  Psychiatric:        Mood and Affect: Mood normal.        Behavior: Behavior normal.     Consultants:  Orthopedic surgery  Procedures:  Bilateral knee arthrocentesis, 01/30/2021  Data Reviewed: I have  personally reviewed labs and imaging studies    LOS: 0 days  Time spent: Greater than 50% of the 35 minute visit was spent in counseling/coordination of care for the patient as laid out in the A&P.   Angel Dee, MD [facility] 01/30/2021, 6:23 PM

## 2021-01-30 NOTE — Care Management Obs Status (Signed)
[place] NOTIFICATION   Patient Details  Name: Angel Cooper MRN: [id number] Date of Birth: May 30, 1930   Medicare Observation Status Notification Given:  Yes    Carles Collet, RN 01/30/2021, 3:11 PM

## 2021-01-30 NOTE — Assessment & Plan Note (Addendum)
-   Knee x-rays on admission showed moderate to advanced tricompartmental degenerative changes - continue ongoing pain control. Appears to have had relief from toradol as well on admission  -Evaluated by PT with recommendations for home health

## 2021-01-30 NOTE — Evaluation (Signed)
Physical Therapy Evaluation Patient Details Name: Angel Cooper MRN: [id number] DOB: 02/21/1930 Today's Date: 01/30/2021  History of Present Illness  Angel Cooper is a 88 y.o. male who presents for evaluation of bilateral knee pain.  He reports he has had knee pain for a long time but in the last few days has become acutely worse to the point that he is not able to stand up or ambulate. Ortho consulted and aspirated  and injected both knees on 1/4; with medical history significant for HTN, Gout, CKD 3, OA, CAD s/p CABG  Clinical Impression   Pt admitted with above diagnosis. Comes from home where he lives with his wife (chronic COPD, can give limited assist), in a single level home with a few steps to enter (has a temporary ramp he can get placed); Uses a 3 wheeled RW at baseline, and has had a functional decline leading up to this admission due to significant knee pain -- came ot ED unbale to get up to stand or walk; Presents to PT  this day feeling better -- able to stand and walk short distances with UE support and minimal assist;  Pt currently with functional limitations due to the deficits listed below (see PT Problem List). Pt will benefit from skilled PT to increase their independence and safety with mobility to allow discharge to the venue listed below.          Recommendations for follow up therapy are one component of a multi-disciplinary discharge planning process, led by the attending physician.  Recommendations may be updated based on patient status, additional functional criteria and insurance authorization.  Follow Up Recommendations Home health PT    Assistance Recommended at Discharge PRN  Patient can return home with the following  A little help with bathing/dressing/bathroom;Assist for transportation    Equipment Recommendations None recommended by PT (pretty well-equipped)  Recommendations for Other Services       Functional Status Assessment Patient has had a  recent decline in their functional status and demonstrates the ability to make significant improvements in function in a reasonable and predictable amount of time.     Precautions / Restrictions Precautions Precautions: Fall Restrictions Weight Bearing Restrictions: No      Mobility  Bed Mobility Overal bed mobility: Needs Assistance Bed Mobility: Sit to Supine       Sit to supine: Min guard   General bed mobility comments: Pt sitting EOB with nursing upon arrival; minguard for getting back in the bed; very slow moving    Transfers Overall transfer level: Needs assistance Equipment used: 2 person hand held assist;Rollator (4 wheels) Transfers: Sit to/from Stand;Bed to chair/wheelchair/BSC Sit to Stand: Min assist   Step pivot transfers: Mod assist;+2 safety/equipment       General transfer comment: Initially got up from bed to recliner with 2 person assist and bil UE support with mod assist to power up and steady with steps to recliner on his R; after seated rest, pt stood from recliner to Rollator with min assist to steady, in particular when whe moved his hands from pushing up from armrests to Rollator; slow rise, but on his own poewr    Ambulation/Gait Ambulation/Gait assistance: Min guard (with and without physical contact) Gait Distance (Feet): 18 Feet Assistive device: Rollator (4 wheels) Gait Pattern/deviations: Step-through pattern;Trunk flexed       General Gait Details: Walked in room around bed with rollaotr; short, slow steps and trunk very flexed  Stairs  Wheelchair Mobility    Modified Rankin (Stroke Patients Only)       Balance Overall balance assessment: Needs assistance   Sitting balance-Leahy Scale: Fair       Standing balance-Leahy Scale: Poor                               Pertinent Vitals/Pain Pain Assessment: Faces Faces Pain Scale: Hurts even more Pain Location: Bil knees Pain Descriptors /  Indicators: Aching;Burning Pain Intervention(s): Monitored during session;Repositioned;Other (comment) (Ended session in bed and Ortho in for aspirations and injections)    Home Living Family/patient expects to be discharged to:: Private residence Living Arrangements: Spouse/significant other Available Help at Discharge: Family Type of Home: House Home Access: Stairs to enter;Ramped entrance (has a temporaryramp he can use) Entrance Stairs-Rails: Right Entrance Stairs-Number of Steps: 2-3   Home Layout: One level Home Equipment: Conservation officer, nature (2 wheels);Rollator (4 wheels);Shower seat (His rollator has 3 wheels and a bag (no seat))      Prior Function Prior Level of Function : Independent/Modified Independent             Mobility Comments: Uses 3 wheeled Rollator RW fulltime for amb; Reports his vision is becoming less functional and anticipates letting his driver's license expire ADLs Comments: with increased pain leading up to this admission, and has not tub-bathed in a while     Hand Dominance        Extremity/Trunk Assessment   Upper Extremity Assessment Upper Extremity Assessment: Overall WFL for tasks assessed (for simple mobility)    Lower Extremity Assessment Lower Extremity Assessment: RLE deficits/detail;LLE deficits/detail;Generalized weakness RLE Deficits / Details: bil knees edematous and warm; extends to just shy of full extension; flexion range WFL, however incr pain with flexion while bearing weight (going from stand to sit, for instance) LLE Deficits / Details: bil knees edematous and warm; extends to just shy of full extension; flexion range WFL, however incr pain with flexion while bearing weight (going from stand to sit, for instance)    Cervical / Trunk Assessment Cervical / Trunk Assessment: Kyphotic  Communication   Communication: No difficulties  Cognition Arousal/Alertness: Awake/alert Behavior During Therapy: WFL for tasks  assessed/performed Overall Cognitive Status: Within Functional Limits for tasks assessed                                          General Comments General comments (skin integrity, edema, etc.): Opted to get back to bed as Ortho was planning to be up soon for knee aspration and injection    Exercises     Assessment/Plan    PT Assessment Patient needs continued PT services  PT Problem List Decreased strength;Decreased range of motion;Decreased activity tolerance;Decreased balance;Decreased mobility;Pain       PT Treatment Interventions DME instruction;Gait training;Stair training;Functional mobility training;Therapeutic activities;Therapeutic exercise;Balance training;Patient/family education    PT Goals (Current goals can be found in the Care Plan section)  Acute Rehab PT Goals Patient Stated Goal: less knee pain PT Goal Formulation: With patient Time For Goal Achievement: 02/13/21 Potential to Achieve Goals: Good    Frequency Min 4X/week     Co-evaluation               AM-PAC PT "6 Clicks" Mobility  Outcome Measure Help needed turning from your back to your side while in  a flat bed without using bedrails?: A Little Help needed moving from lying on your back to sitting on the side of a flat bed without using bedrails?: A Lot Help needed moving to and from a bed to a chair (including a wheelchair)?: A Lot Help needed standing up from a chair using your arms (e.g., wheelchair or bedside chair)?: A Little Help needed to walk in hospital room?: A Little Help needed climbing 3-5 steps with a railing? : A Little 6 Click Score: 16    End of Session Equipment Utilized During Treatment: Gait belt Activity Tolerance: Patient tolerated treatment well Patient left: in bed;with call bell/phone within reach;with bed alarm set Nurse Communication: Mobility status PT Visit Diagnosis: Unsteadiness on feet (R26.81);Pain Pain - Right/Left:  (bilateral) Pain -  part of body: Knee (both)    Time: 6144-3154 PT Time Calculation (min) (ACUTE ONLY): 36 min   Charges:   PT Evaluation $PT Eval Moderate Complexity: 1 Mod PT Treatments $Gait Training: 8-22 mins        Roney Marion, PT  Acute Rehabilitation Services Pager [phone] Office [phone]   Colletta [place] 01/30/2021, 2:35 PM

## 2021-01-30 NOTE — Hospital Course (Signed)
Angel Cooper is an 88 year old male with PMH gout, PAF, CAD s/p CABG, CHF, CKD, GERD, HLD, HTN who presented with worsening bilateral knee pain and swelling.  Symptoms have been progressively worsening for the past several weeks prior to admission.  He was recently increased to 300 mg daily of allopurinol.  He has also undergone prior steroid injection to his knee as well as a prednisone course for presumed gout flare within the past 2 months.  He endorsed not having much relief from prior steroid injection. He was admitted for further work-up of his knee pain and swelling.

## 2021-01-30 NOTE — Plan of Care (Signed)

## 2021-01-30 NOTE — Progress Notes (Signed)
Physical Therapy Note  PT eval complete with full note to follow;  Recommend Home with HHPT follow up for transition out of hospital;  Feels a lot better today, and reports he was surprised that he can stand;  Moves slowly, but using Rollator RW well; has ramp to enter his home;  Will follow,    Roney Marion, PT  Acute Rehabilitation Services Pager [phone] Office [phone]

## 2021-01-30 NOTE — Plan of Care (Signed)
°  Problem: Education: Goal: Knowledge of General Education information will improve Description: Including pain rating scale, medication(s)/side effects and non-pharmacologic comfort measures Outcome: Progressing   Problem: Health Behavior/Discharge Planning: Goal: Ability to manage health-related needs will improve Outcome: Progressing   Problem: Clinical Measurements: Goal: Ability to maintain clinical measurements within normal limits will improve Outcome: Progressing Goal: Will remain free from infection Outcome: Progressing   Problem: Nutrition: Goal: Adequate nutrition will be maintained Outcome: Progressing   Problem: Elimination: Goal: Will not experience complications related to bowel motility Outcome: Progressing

## 2021-01-31 DIAGNOSIS — M109 Gout, unspecified: Secondary | ICD-10-CM | POA: Diagnosis not present

## 2021-01-31 DIAGNOSIS — M25561 Pain in right knee: Secondary | ICD-10-CM | POA: Diagnosis not present

## 2021-01-31 DIAGNOSIS — D72829 Elevated white blood cell count, unspecified: Secondary | ICD-10-CM | POA: Diagnosis not present

## 2021-01-31 DIAGNOSIS — M25562 Pain in left knee: Secondary | ICD-10-CM | POA: Diagnosis not present

## 2021-01-31 DIAGNOSIS — J9601 Acute respiratory failure with hypoxia: Secondary | ICD-10-CM | POA: Diagnosis not present

## 2021-01-31 DIAGNOSIS — N1832 Chronic kidney disease, stage 3b: Secondary | ICD-10-CM | POA: Diagnosis not present

## 2021-01-31 DIAGNOSIS — G8929 Other chronic pain: Secondary | ICD-10-CM | POA: Diagnosis not present

## 2021-01-31 LAB — CBC WITH DIFFERENTIAL/PLATELET
Abs Immature Granulocytes: 0.08 10*3/uL — ABNORMAL HIGH (ref 0.00–0.07)
Basophils Absolute: 0 10*3/uL (ref 0.0–0.1)
Basophils Relative: 0 %
Eosinophils Absolute: 0 10*3/uL (ref 0.0–0.5)
Eosinophils Relative: 0 %
HCT: 27.6 % — ABNORMAL LOW (ref 39.0–52.0)
Hemoglobin: 9.2 g/dL — ABNORMAL LOW (ref 13.0–17.0)
Immature Granulocytes: 1 %
Lymphocytes Relative: 5 %
Lymphs Abs: 0.7 10*3/uL (ref 0.7–4.0)
MCH: 29.9 pg (ref 26.0–34.0)
MCHC: 33.3 g/dL (ref 30.0–36.0)
MCV: 89.6 fL (ref 80.0–100.0)
Monocytes Absolute: 0.2 10*3/uL (ref 0.1–1.0)
Monocytes Relative: 2 %
Neutro Abs: 12.7 10*3/uL — ABNORMAL HIGH (ref 1.7–7.7)
Neutrophils Relative %: 92 %
Platelets: 467 10*3/uL — ABNORMAL HIGH (ref 150–400)
RBC: 3.08 MIL/uL — ABNORMAL LOW (ref 4.22–5.81)
RDW: 15.7 % — ABNORMAL HIGH (ref 11.5–15.5)
WBC: 13.7 10*3/uL — ABNORMAL HIGH (ref 4.0–10.5)
nRBC: 0 % (ref 0.0–0.2)

## 2021-01-31 LAB — BASIC METABOLIC PANEL
Anion gap: 11 (ref 5–15)
BUN: 32 mg/dL — ABNORMAL HIGH (ref 8–23)
CO2: 24 mmol/L (ref 22–32)
Calcium: 8.4 mg/dL — ABNORMAL LOW (ref 8.9–10.3)
Chloride: 99 mmol/L (ref 98–111)
Creatinine, Ser: 1.58 mg/dL — ABNORMAL HIGH (ref 0.61–1.24)
GFR, Estimated: 42 mL/min — ABNORMAL LOW (ref >60)
Glucose, Bld: 155 mg/dL — ABNORMAL HIGH (ref 70–99)
Potassium: 4.4 mmol/L (ref 3.5–5.1)
Sodium: 134 mmol/L — ABNORMAL LOW (ref 135–145)

## 2021-01-31 LAB — MAGNESIUM: Magnesium: 1.9 mg/dL (ref 1.7–2.4)

## 2021-01-31 MED ORDER — PREDNISONE 20 MG PO TABS
40.0000 mg | ORAL_TABLET | Freq: Every day | ORAL | Status: DC
  Administered 2021-01-31 – 2021-02-02 (×3): 40 mg via ORAL
  Filled 2021-01-31 (×3): qty 2

## 2021-01-31 MED ORDER — KETOROLAC TROMETHAMINE 15 MG/ML IJ SOLN: 15.0000 mg | Freq: Once | INTRAMUSCULAR | Status: DC

## 2021-01-31 MED ORDER — KETOROLAC TROMETHAMINE 15 MG/ML IJ SOLN
15.0000 mg | Freq: Once | INTRAMUSCULAR | Status: AC
  Administered 2021-01-31: 15 mg via INTRAVENOUS
  Filled 2021-01-31: qty 1

## 2021-01-31 NOTE — Progress Notes (Signed)
Progress Note    Angel Cooper   EYC:[id number]  DOB: Oct 22, 1930  DOA: 01/29/2021     0 PCP: [name], MD  Initial CC: knee pain  Hospital Course: Mr. Lybbert is an 88 year old male with PMH gout, PAF, CAD s/p CABG, CHF, CKD, GERD, HLD, HTN who presented with worsening bilateral knee pain and swelling.  Symptoms have been progressively worsening for the past several weeks prior to admission.  He was recently increased to 300 mg daily of allopurinol.  He has also undergone prior steroid injection to his knee as well as a prednisone course for presumed gout flare within the past 2 months.  He endorsed not having much relief from prior steroid injection. He was admitted for further work-up of his knee pain and swelling.  Interval History:  No events overnight.  Patient endorsed worsening swelling in his knees and pain this morning.  He does get some relief from [place] but had more relief after given another dose of Toradol today through his IV. We discussed possibly discharging home however much apprehension was endorsed by the patient as well as his family.  The patient endorsed that he lives alone and would not have any assistance from family and he does not feel safe at this time discharging home today.  Assessment & Plan: * Knee pain, bilateral- (present on admission) - Knee x-rays on admission showed moderate to advanced tricompartmental degenerative changes - continue ongoing pain control. Appears to have had relief from toradol as well on admission  -Evaluated by PT with recommendations for home health  Gouty arthritis- (present on admission) - Knee x-rays on admission showed moderate to advanced tricompartmental degenerative changes -Evaluated by orthopedic surgery after admission and underwent bilateral arthrocentesis - results seem consistent with gout/inflammation and noted to have intracellular monosodium urate crystals on analysis as well; he underwent steroid injection  with Depo-Medrol after arthrocentesis and fluid analysis -Follow-up cultures, NGTD -Continue Toradol as needed as well -Uric acid level 8.1 on admission.  Allopurinol dose renally adjusted - unfortunately patient slow to improve but showing signs of improvement slowly with PT/OT and mobility specialist. Patient and family are stating that they do not feel safe discharging home today and patient lives alone and he is still requiring some assistance for safe ambulation, therefore I'm inclined to keep patient in hospital at least 1 more night while he continues to regain further mobility in a safer fashion  Leukocytosis- (present on admission) - Presumed reactive in setting of gout flare - Follow-up arthrocentesis culture, still negative to date  Essential hypertension- (present on admission) - Continue amlodipine  Chronic kidney disease, stage 3b ([place])- (present on admission) - patient has history of CKD3b. Baseline creat ~ 1.5, eGFR 40 - renally adjust meds as needed     Old records reviewed in assessment of this patient  Antimicrobials:   DVT prophylaxis: Lovenox  Code Status:   Code Status: Full Code  Disposition Plan: Home with home health Status is: Observation  Objective: Blood pressure 114/68, pulse 92, temperature 97.9 F (36.6 C), temperature source Oral, resp. rate 17, height 5' 4"  (1.626 m), weight 52.2 kg, SpO2 95 %.  Examination:  Physical Exam Constitutional:      General: He is not in acute distress.    Appearance: Normal appearance.  HENT:     Head: Normocephalic and atraumatic.     Mouth/Throat:     Mouth: Mucous membranes are moist.  Eyes:     Extraocular Movements: Extraocular movements  intact.  Cardiovascular:     Rate and Rhythm: Normal rate and regular rhythm.     Heart sounds: Normal heart sounds.  Pulmonary:     Effort: Pulmonary effort is normal. No respiratory distress.     Breath sounds: Normal breath sounds. No wheezing.  Abdominal:      General: Bowel sounds are normal. There is no distension.     Palpations: Abdomen is soft.     Tenderness: There is no abdominal tenderness.  Musculoskeletal:     Cervical back: Normal range of motion and neck supple.     Comments: Bilateral knee swelling noted with decreased active ROM due to swelling and pain.  Knees do appear slightly more swollen today.  No significant erythema or calor appreciated  Skin:    General: Skin is warm and dry.  Neurological:     General: No focal deficit present.     Mental Status: He is alert.  Psychiatric:        Mood and Affect: Mood normal.        Behavior: Behavior normal.     Consultants:  Orthopedic surgery  Procedures:  Bilateral knee arthrocentesis, 01/30/2021  Data Reviewed: I have personally reviewed labs and imaging studies    LOS: 0 days  Time spent: Greater than 50% of the 35 minute visit was spent in counseling/coordination of care for the patient as laid out in the A&P.   [name], MD [facility] 01/31/2021, 5:40 PM

## 2021-01-31 NOTE — Plan of Care (Signed)

## 2021-01-31 NOTE — Progress Notes (Signed)
Mobility Specialist Criteria Algorithm Info. ° ° 01/31/21 1500  °Mobility  °Activity Ambulated in hall °(in chair before and after ambulation)  °Range of Motion/Exercises Active;All extremities  °Level of Assistance Standby assist, set-up cues, supervision of patient - no hands on  °Assistive Device Front wheel walker  °Distance Ambulated (ft) 35 ft  °Mobility Ambulated with assistance in hallway  °Mobility Response Tolerated well  °Mobility performed by Mobility specialist  °Bed Position Chair  ° °Patient received in chair eager to participate. Stood with supervision and ambulated in hallway min guard with steady gait. Tolerated ambulation well without complaint or incident. Was left sitting in chair with all needs met, call bell in reach.  ° ° ° °01/31/2021 °3:49 PM ° °

## 2021-01-31 NOTE — Progress Notes (Signed)
[place] visited pt. per [facility] consult for AD education/completion; pt. sitting up in recliner at bedside with son in chair beside him.  Son and pt. say they are not interested in completing AD at this time.  No further needs expressed.  Lindaann Pascal, Chaplain Pager: [phone]

## 2021-01-31 NOTE — Progress Notes (Signed)
Physical Therapy Treatment Patient Details Name: Angel Cooper MRN: [id number] DOB: 1931-01-22 Today's Date: 01/31/2021   History of Present Illness Angel Cooper is a 88 y.o. male who presents for evaluation of bilateral knee pain.  He reports he has had knee pain for a long time but in the last few days has become acutely worse to the point that he is not able to stand up or ambulate. Ortho consulted and aspirated  and injected both knees on 1/4; with medical history significant for HTN, Gout, CKD 3, OA, CAD s/p CABG    PT Comments    Returned for another session to see if youth-sized RW would assist pt more -- it is able to lowered more and can allow pt to put more weight into arms to unweigh painful knees while walking; Pt reported not much difference than walking with Rollator;   Very much appreciate OT coming to see [name] -- thanks.   Recommendations for follow up therapy are one component of a multi-disciplinary discharge planning process, led by the attending physician.  Recommendations may be updated based on patient status, additional functional criteria and insurance authorization.  Follow Up Recommendations  Home health PT     Assistance Recommended at Discharge Intermittent Supervision/Assistance  Patient can return home with the following A little help with walking and/or transfers;Assist for transportation;Help with stairs or ramp for entrance   Equipment Recommendations  None recommended by PT (pretty well-equipped fo rmobility; will defer ADL/bathroom equip recs to OT)    Recommendations for Other Services OT consult (ordered per protocol)     Precautions / Restrictions Precautions Precautions: Fall Precaution Comments: Anticipate fall rik to decr as knee pain improves Restrictions Weight Bearing Restrictions: No     Mobility  Bed Mobility                    Transfers Overall transfer level: Needs assistance Equipment used: Rolling  walker (2 wheels) (Youth sized RW) Transfers: Sit to/from Stand Sit to Stand: Min guard (without physical contact)           General transfer comment: Very slow rise from recliner with dependence on UEs for steadiness and rise; slow moving, but not needing physical assist    Ambulation/Gait Ambulation/Gait assistance: Min guard (without phsyical contact) Gait Distance (Feet): 30 Feet Assistive device: Rolling walker (2 wheels) Gait Pattern/deviations: Step-through pattern;Trunk flexed Gait velocity: quite slow     General Gait Details: Slow steps and trunk significantly flexed; fatigued after walk   Stairs             Wheelchair Mobility    Modified Rankin (Stroke Patients Only)       Balance Overall balance assessment: Needs assistance   Sitting balance-Leahy Scale: Fair       Standing balance-Leahy Scale: Poor                              Cognition Arousal/Alertness: Awake/alert Behavior During Therapy: WFL for tasks assessed/performed Overall Cognitive Status: Within Functional Limits for tasks assessed                                          Exercises      General Comments General comments (skin integrity, edema, etc.): from earlier note of this date, but worth repeating:  Took increased time to discuss ability to transition home; pt does have 2 sons locally who can assist as needed; pt's wife is currently in hospital as well (on 6N); Pt and son are concerned re: getting home today -- he is not back to his baseline walking and activity tolerance; I mentioned getting home, perhaps using his wc prn, and working on gettign back to baseline with [facility] therapies; Pt and son are hesitant, and request another night in hospital; While Obs status does give quite a push to get Angel Cooper home, the argument to stay another night is not entirely unreasonable -- he has made solid progress between yesterday and today, and I would  anticipate more progress tomorrow; another night inpt will also give OT a chance to consult and weigh in on ADLs, which are pivotal to getting home      Pertinent Vitals/Pain Pain Assessment: Faces Faces Pain Scale: Hurts little more Pain Location: Bil knees Pain Descriptors / Indicators: Aching Pain Intervention(s): Monitored during session    Home Living Family/patient expects to be discharged to:: Private residence Living Arrangements: Spouse/significant other Available Help at Discharge: Family Type of Home: House Home Access: Stairs to enter;Ramped entrance Entrance Stairs-Rails: Right Entrance Stairs-Number of Steps: 2-3   Home Layout: One level Home Equipment: Conservation officer, nature (2 wheels);Rollator (4 wheels);Shower seat;Cane - single point      Prior Function            PT Goals (current goals can now be found in the care plan section) Acute Rehab PT Goals Patient Stated Goal: less knee pain PT Goal Formulation: With patient Time For Goal Achievement: 02/13/21 Potential to Achieve Goals: Good Progress towards PT goals: Progressing toward goals (slowly)    Frequency    Min 4X/week      PT Plan Current plan remains appropriate    Co-evaluation              AM-PAC PT "6 Clicks" Mobility   Outcome Measure  Help needed turning from your back to your side while in a flat bed without using bedrails?: A Little Help needed moving from lying on your back to sitting on the side of a flat bed without using bedrails?: A Lot Help needed moving to and from a bed to a chair (including a wheelchair)?: A Lot Help needed standing up from a chair using your arms (e.g., wheelchair or bedside chair)?: A Little Help needed to walk in hospital room?: A Little Help needed climbing 3-5 steps with a railing? : A Little 6 Click Score: 16    End of Session Equipment Utilized During Treatment: Gait belt Activity Tolerance: Patient tolerated treatment well Patient left: in  chair;with call bell/phone within reach Nurse Communication: Mobility status PT Visit Diagnosis: Unsteadiness on feet (R26.81);Pain Pain - Right/Left:  (bil) Pain - part of body: Knee (both)     Time: 1610-9604 PT Time Calculation (min) (ACUTE ONLY): 12 min  Charges:  $Gait Training: 8-22 mins                     Roney Marion, PT  Acute Rehabilitation Services Pager [phone] Office [phone]    Colletta [place] 01/31/2021, 5:12 PM

## 2021-01-31 NOTE — Evaluation (Signed)
Occupational Therapy Evaluation Patient Details Name: Angel Cooper MRN: [id number] DOB: [birth year] Today's Date: 01/31/2021   History of Present Illness Angel Cooper is a 88 y.o. male who presents for evaluation of bilateral knee pain.  He reports he has had knee pain for a long time but in the last few days has become acutely worse to the point that he is not able to stand up or ambulate. Ortho consulted and aspirated  and injected both knees on 1/4; with medical history significant for HTN, Gout, CKD 3, OA, CAD s/p CABG   Clinical Impression   Patient admitted for the diagnosis above.  PTA he lives with his spouse in a one level home.  They have a son that assists with community mobility as needed.  Biggest deficit is bilateral knee pain, and lower extremity edema.  Patient actually did pretty good despite reported discomfort.  He needed no physical assist, just occasional Min guard when performing stand tasks when he needed to let go of his RW.  Patient, OT and son discussed safety strategies regarding ADL completion sinkside from a sit/stand level with initial distant supervision from son if possible.  Patient does not need any adaptive equipment to reach his feet, and it appears he has the needed DME at home.  Also discussed assist for home management and meals as needed.  Encouraged elevating of LE's throughout the day, mobility throughout the day to decrease stiffness, and ADL completion from a sit/stand level to mitigate falls.  OT can follow up tomorrow if needed.  OT can defer potential HH OT to [facility] PT once they assess.           Recommendations for follow up therapy are one component of a multi-disciplinary discharge planning process, led by the attending physician.  Recommendations may be updated based on patient status, additional functional criteria and insurance authorization.   Follow Up Recommendations  Defer OT follow up to [facility] PT once assessed if a need exists.       Assistance Recommended at Discharge None  Patient can return home with the following Assistance with cooking/housework    Functional Status Assessment  Patient has had a recent decline in their functional status and demonstrates the ability to make significant improvements in function in a reasonable and predictable amount of time.  Equipment Recommendations  None recommended by OT    Recommendations for Other Services       Precautions / Restrictions Precautions Precautions: Fall Precaution Comments: Anticipate fall rik to decr as knee pain improves Restrictions Weight Bearing Restrictions: No      Mobility Bed Mobility               General bed mobility comments: up in recliner    Transfers Overall transfer level: Needs assistance Equipment used:  (Patient had his 3 WRW) Transfers: Sit to/from Stand Sit to Stand: Supervision;Min guard           General transfer comment: Very slow rise from recliner with dependence on UEs for steadiness and rise; slow moving, but not needing physical assist      Balance Overall balance assessment: Needs assistance Sitting-balance support: Feet supported Sitting balance-Leahy Scale: Good     Standing balance support: Reliant on assistive device for balance Standing balance-Leahy Scale: Fair Standing balance comment: did okay with static stand at sink  ADL either performed or assessed with clinical judgement   ADL       Grooming: Wash/dry hands;Supervision/safety;Standing       Lower Body Bathing: Min guard;Sit to/from stand       Lower Body Dressing: Min guard;Sit to/from stand   Toilet Transfer: Supervision/safety;Rolling walker (2 wheels);Regular Toilet             General ADL Comments: patient able to reach B feet easily form sitting position.  No physical assist, but Min Guard to close supervision for sit to stand tasks with hands off rw.     Vision Baseline  Vision/History: 1 Wears glasses Patient Visual Report: No change from baseline Additional Comments: patient admits to declining, but functional vision.     Perception Perception Perception: Not tested   Praxis Praxis Praxis: Not tested    Pertinent Vitals/Pain Pain Assessment: Faces Faces Pain Scale: Hurts little more Pain Location: Bil knees Pain Descriptors / Indicators: Aching Pain Intervention(s): Monitored during session     Hand Dominance Right   Extremity/Trunk Assessment Upper Extremity Assessment Upper Extremity Assessment: Overall WFL for tasks assessed   Lower Extremity Assessment Lower Extremity Assessment: Defer to PT evaluation   Cervical / Trunk Assessment Cervical / Trunk Assessment: Kyphotic   Communication Communication Communication: No difficulties   Cognition Arousal/Alertness: Awake/alert Behavior During Therapy: WFL for tasks assessed/performed Overall Cognitive Status: Within Functional Limits for tasks assessed                                                      Home Living Family/patient expects to be discharged to:: Private residence Living Arrangements: Spouse/significant other Available Help at Discharge: Family Type of Home: House Home Access: Stairs to enter;Ramped entrance Entrance Stairs-Number of Steps: 2-3 Entrance Stairs-Rails: Right Home Layout: One level     Bathroom Shower/Tub: Tub/shower unit;Walk-in shower   Bathroom Toilet: Standard     Home Equipment: Conservation officer, nature (2 wheels);Rollator (4 wheels);Shower seat;Cane - single point          Prior Functioning/Environment Prior Level of Function : Independent/Modified Independent               ADLs Comments: Per PT eval: with increased pain leading up to this admission, and has not tub-bathed in a while, performing sink baths sit/stand level        OT Problem List: Impaired balance (sitting and/or standing);Pain      OT  Treatment/Interventions: Self-care/ADL training;Balance training;Therapeutic activities    OT Goals(Current goals can be found in the care plan section) Acute Rehab OT Goals Patient Stated Goal: Return home OT Goal Formulation: With patient Time For Goal Achievement: 02/07/21 Potential to Achieve Goals: Good ADL Goals Pt Will Perform Grooming: with modified independence;sitting;standing Pt Will Perform Lower Body Bathing: with modified independence;sit to/from stand Pt Will Perform Lower Body Dressing: with modified independence;sit to/from stand Pt Will Transfer to Toilet: with modified independence;ambulating;regular height toilet  OT Frequency: Min 1X/week    Co-evaluation              AM-PAC OT "6 Clicks" Daily Activity     Outcome Measure Help from another person eating meals?: None Help from another person taking care of personal grooming?: A Little Help from another person toileting, which includes using toliet, bedpan, or urinal?: A Little Help from another person  bathing (including washing, rinsing, drying)?: A Little Help from another person to put on and taking off regular upper body clothing?: None Help from another person to put on and taking off regular lower body clothing?: A Little 6 Click Score: 20   End of Session Equipment Utilized During Treatment: Other (comment) (3 WRW)  Activity Tolerance: Patient tolerated treatment well Patient left: in chair;with call bell/phone within reach;with family/visitor present  OT Visit Diagnosis: Unsteadiness on feet (R26.81);Pain Pain - Right/Left: Left Pain - part of body: Knee                Time: 1610-9604 OT Time Calculation (min): 17 min Charges:  OT General Charges $OT Visit: 1 Visit OT Evaluation $OT Eval Moderate Complexity: 1 Mod  01/31/2021  RP, OTR/L  Acute Rehabilitation Services  Office:  [phone]   Metta Clines 01/31/2021, 5:18 PM

## 2021-01-31 NOTE — Progress Notes (Addendum)
Limiting to  dose of ketorolac 15mg  IV due to renal impairment per Dr. Sabino Gasser.  Onnie Boer, PharmD, BCIDP, AAHIVP, CPP Infectious Disease Pharmacist 01/31/2021 10:44 AM

## 2021-01-31 NOTE — Progress Notes (Signed)
Physical Therapy Treatment Patient Details Name: Angel Cooper MRN: [id number] DOB: 1930/02/06 Today's Date: 01/31/2021   History of Present Illness Angel Cooper is a 88 y.o. male who presents for evaluation of bilateral knee pain.  He reports he has had knee pain for a long time but in the last few days has become acutely worse to the point that he is not able to stand up or ambulate. Ortho consulted and aspirated  and injected both knees on 1/4; with medical history significant for HTN, Gout, CKD 3, OA, CAD s/p CABG    PT Comments    Continuing work on functional mobility and activity tolerance;  Session focused on transfers and amb, with notable improvement in activity tolerance and gait distance; Close guard for safety, and slow moving, but no physical assist required with standing up and two bouts of amb into the hallway and back; this distance doesn't quite show full household amb, but is typically adequate for room-to-room;   Took increased time to discuss ability to transition home; pt does have 2 sons locally who can assist as needed; pt's wife is currently in hospital as well (on 6N); Pt and son are concerned re: getting home today -- he is not back to his baseline walking and activity tolerance; I mentioned getting home, perhaps using his wc prn, and working on gettign back to baseline with [facility] therapies; Pt and son are hesitant, and request another night in hospital; While Obs status does give quite a push to get Angel Cooper home, the argument to stay another night is not entirely unreasonable -- he has made solid progress between yesterday and today, and I would anticipate more progress tomorrow; another night inpt will also give OT a chance to consult and weigh in on ADLs, which are pivotal to getting home   Recommendations for follow up therapy are one component of a multi-disciplinary discharge planning process, led by the attending physician.  Recommendations may be updated  based on patient status, additional functional criteria and insurance authorization.  Follow Up Recommendations  Home health PT     Assistance Recommended at Discharge Intermittent Supervision/Assistance  Patient can return home with the following A little help with walking and/or transfers;Assist for transportation;Help with stairs or ramp for entrance   Equipment Recommendations  None recommended by PT (pretty well-equipped fo rmobility; will defer ADL/bathroom equip recs to OT)    Recommendations for Other Services OT consult (ordered per protocol)     Precautions / Restrictions Precautions Precautions: Fall     Mobility  Bed Mobility                    Transfers Overall transfer level: Needs assistance   Transfers: Sit to/from Stand Sit to Stand: Min guard (without physical contact)           General transfer comment: Very slow rise frim recliner with dependence on UEs for steadiness and rise; slow moving, but not needing physical assist; stood twice from recliner    Ambulation/Gait Ambulation/Gait assistance: Min guard (Close guard, but no physical contact) Gait Distance (Feet): 30 Feet (x2) Assistive device: Rollator (4 wheels) Gait Pattern/deviations: Step-through pattern;Trunk flexed       General Gait Details: performed 2 bouts of amb with RW; slow steps and trunk significantly flexed (says that is his typical posture); one seated rest break, and patient indicated his knees felt less stiff during his second bout of walking   Stairs  Wheelchair Mobility    Modified Rankin (Stroke Patients Only)       Balance     Sitting balance-Angel Cooper Scale: Fair       Standing balance-Angel Cooper Scale: Poor                              Cognition Arousal/Alertness: Awake/alert Behavior During Therapy: WFL for tasks assessed/performed Overall Cognitive Status: Within Functional Limits for tasks assessed                                           Exercises      General Comments General comments (skin integrity, edema, etc.): Took increased time to discuss ability to transition home; pt does have 2 sons locally who can assist as needed; pt's wife is currently in hospital as well (on 6N); Pt and son are concerned re: getting home today -- he is not back to his baseline walking and activity tolerance; I mentioned getting home, perhaps using his wc prn, and working on gettign back to baseline with [facility] therapies; Pt and son are hesitant, and request another night in hospital; While Obs status does give quite a push to get Angel Cooper home, the argument to stay another night is not entirely unreasonable -- he has made solid progress between yesterday and today, and I would anticipate more progress tomorrow; another night inpt will also give OT a chance to consult and weigh in on ADLs, which are pivotal to getting home      Pertinent Vitals/Pain Pain Assessment: Faces Faces Pain Scale: Hurts little more Pain Location: Bil knees Pain Descriptors / Indicators: Aching;Burning Pain Intervention(s): Monitored during session    Home Living                          Prior Function            PT Goals (current goals can now be found in the care plan section) Acute Rehab PT Goals Patient Stated Goal: less knee pain PT Goal Formulation: With patient Time For Goal Achievement: 02/13/21 Potential to Achieve Goals: Good Progress towards PT goals: Progressing toward goals    Frequency    Min 4X/week      PT Plan Current plan remains appropriate    Co-evaluation              AM-PAC PT "6 Clicks" Mobility   Outcome Measure  Help needed turning from your back to your side while in a flat bed without using bedrails?: A Little Help needed moving from lying on your back to sitting on the side of a flat bed without using bedrails?: A Lot Help needed moving to and from a bed to a chair  (including a wheelchair)?: A Lot Help needed standing up from a chair using your arms (e.g., wheelchair or bedside chair)?: A Little Help needed to walk in hospital room?: A Little Help needed climbing 3-5 steps with a railing? : A Little 6 Click Score: 16    End of Session Equipment Utilized During Treatment: Gait belt Activity Tolerance: Patient tolerated treatment well Patient left: in chair;with call bell/phone within reach;with family/visitor present Nurse Communication: Mobility status PT Visit Diagnosis: Unsteadiness on feet (R26.81);Pain Pain - Right/Left:  (bil) Pain - part of body: Knee (both)  01/31/21 1230  PT Time Calculation  PT Start Time (ACUTE ONLY) 1216  PT Stop Time (ACUTE ONLY) 1249  PT Time Calculation (min) (ACUTE ONLY) 33 min  PT General Charges  $$ ACUTE PT VISIT 1 Visit                      Roney Marion, [place]  Acute Rehabilitation Services Pager [phone] Office [phone]    Colletta [place] 01/31/2021, 4:21 PM

## 2021-01-31 NOTE — Progress Notes (Signed)
Mobility Specialist Progress Note   01/31/21 1747  Mobility  Activity Ambulated in room  Level of Assistance Minimal assist, patient does 75% or more  Assistive Device Front wheel walker  Distance Ambulated (ft) 80 ft (40+40)  Mobility Ambulated with assistance in room  Mobility Response Tolerated well  Mobility performed by Mobility specialist  Bed Position Chair  $Mobility charge 1 Mobility   Received pt in chair w/ no complaints and agreeable. X1 seated break d/t onset of LE soreness and fatigue. Returned back to chair stating soreness increased and that they would like to rest for the remainder of the day. Left w/ call bell in reach and RN notified.   Holland Falling Mobility Specialist Phone Number [phone]

## 2021-02-01 ENCOUNTER — Observation Stay (HOSPITAL_BASED_OUTPATIENT_CLINIC_OR_DEPARTMENT_OTHER): Payer: Medicare HMO

## 2021-02-01 ENCOUNTER — Observation Stay (HOSPITAL_COMMUNITY): Payer: Medicare HMO

## 2021-02-01 DIAGNOSIS — R609 Edema, unspecified: Secondary | ICD-10-CM | POA: Diagnosis not present

## 2021-02-01 DIAGNOSIS — M25562 Pain in left knee: Secondary | ICD-10-CM | POA: Diagnosis not present

## 2021-02-01 DIAGNOSIS — J9601 Acute respiratory failure with hypoxia: Secondary | ICD-10-CM | POA: Diagnosis not present

## 2021-02-01 DIAGNOSIS — N1832 Chronic kidney disease, stage 3b: Secondary | ICD-10-CM | POA: Diagnosis not present

## 2021-02-01 DIAGNOSIS — G8929 Other chronic pain: Secondary | ICD-10-CM | POA: Diagnosis not present

## 2021-02-01 DIAGNOSIS — R0902 Hypoxemia: Secondary | ICD-10-CM | POA: Diagnosis not present

## 2021-02-01 DIAGNOSIS — J9690 Respiratory failure, unspecified, unspecified whether with hypoxia or hypercapnia: Secondary | ICD-10-CM | POA: Diagnosis not present

## 2021-02-01 DIAGNOSIS — D72829 Elevated white blood cell count, unspecified: Secondary | ICD-10-CM | POA: Diagnosis not present

## 2021-02-01 DIAGNOSIS — I517 Cardiomegaly: Secondary | ICD-10-CM | POA: Diagnosis not present

## 2021-02-01 DIAGNOSIS — M109 Gout, unspecified: Secondary | ICD-10-CM | POA: Diagnosis not present

## 2021-02-01 DIAGNOSIS — M25561 Pain in right knee: Secondary | ICD-10-CM | POA: Diagnosis not present

## 2021-02-01 LAB — BASIC METABOLIC PANEL
Anion gap: 10 (ref 5–15)
BUN: 60 mg/dL — ABNORMAL HIGH (ref 8–23)
CO2: 26 mmol/L (ref 22–32)
Calcium: 8.3 mg/dL — ABNORMAL LOW (ref 8.9–10.3)
Chloride: 93 mmol/L — ABNORMAL LOW (ref 98–111)
Creatinine, Ser: 2.28 mg/dL — ABNORMAL HIGH (ref 0.61–1.24)
GFR, Estimated: 27 mL/min — ABNORMAL LOW (ref >60)
Glucose, Bld: 185 mg/dL — ABNORMAL HIGH (ref 70–99)
Potassium: 4.7 mmol/L (ref 3.5–5.1)
Sodium: 129 mmol/L — ABNORMAL LOW (ref 135–145)

## 2021-02-01 LAB — CBC WITH DIFFERENTIAL/PLATELET
Abs Immature Granulocytes: 0.11 10*3/uL — ABNORMAL HIGH (ref 0.00–0.07)
Basophils Absolute: 0 10*3/uL (ref 0.0–0.1)
Basophils Relative: 0 %
Eosinophils Absolute: 0 10*3/uL (ref 0.0–0.5)
Eosinophils Relative: 0 %
HCT: 26.7 % — ABNORMAL LOW (ref 39.0–52.0)
Hemoglobin: 8.9 g/dL — ABNORMAL LOW (ref 13.0–17.0)
Immature Granulocytes: 1 %
Lymphocytes Relative: 4 %
Lymphs Abs: 0.8 10*3/uL (ref 0.7–4.0)
MCH: 29.6 pg (ref 26.0–34.0)
MCHC: 33.3 g/dL (ref 30.0–36.0)
MCV: 88.7 fL (ref 80.0–100.0)
Monocytes Absolute: 0.8 10*3/uL (ref 0.1–1.0)
Monocytes Relative: 5 %
Neutro Abs: 16.1 10*3/uL — ABNORMAL HIGH (ref 1.7–7.7)
Neutrophils Relative %: 90 %
Platelets: 564 10*3/uL — ABNORMAL HIGH (ref 150–400)
RBC: 3.01 MIL/uL — ABNORMAL LOW (ref 4.22–5.81)
RDW: 15.7 % — ABNORMAL HIGH (ref 11.5–15.5)
WBC: 17.8 10*3/uL — ABNORMAL HIGH (ref 4.0–10.5)
nRBC: 0 % (ref 0.0–0.2)

## 2021-02-01 LAB — GLUCOSE, CAPILLARY: Glucose-Capillary: 156 mg/dL — ABNORMAL HIGH (ref 70–99)

## 2021-02-01 LAB — D-DIMER, QUANTITATIVE: D-Dimer, Quant: 17.95 ug/mL-FEU — ABNORMAL HIGH (ref 0.00–0.50)

## 2021-02-01 LAB — MAGNESIUM: Magnesium: 2 mg/dL (ref 1.7–2.4)

## 2021-02-01 MED ORDER — LACTULOSE 10 GM/15ML PO SOLN
30.0000 g | Freq: Once | ORAL | Status: AC
  Administered 2021-02-01: 30 g via ORAL
  Filled 2021-02-01: qty 45

## 2021-02-01 MED ORDER — SODIUM CHLORIDE 0.9 % IV BOLUS
1000.0000 mL | Freq: Once | INTRAVENOUS | Status: AC
  Administered 2021-02-01: 1000 mL via INTRAVENOUS

## 2021-02-01 MED ORDER — BISACODYL 10 MG RE SUPP
10.0000 mg | Freq: Once | RECTAL | Status: AC
  Administered 2021-02-01: 10 mg via RECTAL
  Filled 2021-02-01: qty 1

## 2021-02-01 MED ORDER — HYDROCODONE-ACETAMINOPHEN 5-325 MG PO TABS: 1.0000 | ORAL_TABLET | Freq: Four times a day (QID) | ORAL | 0 refills | Status: DC | PRN

## 2021-02-01 MED ORDER — SENNOSIDES-DOCUSATE SODIUM 8.6-50 MG PO TABS
2.0000 | ORAL_TABLET | Freq: Two times a day (BID) | ORAL | Status: DC
  Administered 2021-02-01 – 2021-02-02 (×2): 2 via ORAL
  Filled 2021-02-01 (×2): qty 2

## 2021-02-01 MED ORDER — SORBITOL 70 % SOLN
30.0000 mL | Freq: Once | Status: AC
  Administered 2021-02-01: 30 mL via ORAL
  Filled 2021-02-01: qty 30

## 2021-02-01 MED ORDER — ALLOPURINOL 100 MG PO TABS: 50.0000 mg | ORAL_TABLET | Freq: Every day | ORAL | 3 refills | Status: DC

## 2021-02-01 MED ORDER — PREDNISONE 20 MG PO TABS: 40.0000 mg | ORAL_TABLET | Freq: Every day | ORAL | 0 refills | Status: AC

## 2021-02-01 MED ORDER — PANTOPRAZOLE SODIUM 40 MG PO TBEC
40.0000 mg | DELAYED_RELEASE_TABLET | Freq: Every day | ORAL | Status: DC
  Administered 2021-02-01 – 2021-02-02 (×2): 40 mg via ORAL
  Filled 2021-02-01 (×2): qty 1

## 2021-02-01 NOTE — Progress Notes (Signed)
SATURATION QUALIFICATIONS: (This note is used to comply with regulatory documentation for home oxygen)  Patient Saturations on Room Air at Rest = 100%  Patient Saturations on Room Air while Ambulating =84 %  Patient Saturations on 2 Liters of oxygen while Ambulating =95%  Please briefly explain why patient needs home oxygen: Pt was ambulated in room and c/o sob sat at 84%.,and oxygen provided at 2l/min, Pt sat. At 99% and still c/o slight sob.

## 2021-02-01 NOTE — TOC Initial Note (Addendum)
Transition of Care [facility]) - Initial/Assessment Note    Patient Details  Name: Angel Cooper MRN: [id number] Date of Birth: 08-25-1930  [facility] CM/SW Contact:    Verdell Carmine, RN Phone Number: 02/01/2021, 2:26 PM  Clinical Narrative:                 Patient admitted with knee soreness bilateral knee athrocentesis done. Patient is still having trouble with [facility] with excertion. Oxygen parameters indicate the need for home oxygen therapy. PT has recommended HH PT.  Called room as this RNCM is remote. The MD was in at the time, and this RNCM was asked to call back. 1434 re-called  patient he is ready to go to Xray and having shortness of breath. He does not want to discuss these things at this time.   Expected Discharge Plan: [place] Barriers to Discharge: Continued Medical Work up   Patient Goals and CMS Choice        Expected Discharge Plan and Services Expected Discharge Plan: [place]   Discharge Planning Services: CM Consult   Living arrangements for the past 2 months: Single Family Home Expected Discharge Date: 02/01/21                         HH Arranged: PT, OT          Prior Living Arrangements/Services Living arrangements for the past 2 months: Single Family Home Lives with:: Spouse Patient language and need for interpreter reviewed:: Yes Do you feel safe going back to the place where you live?: Yes      Need for Family Participation in Patient Care: Yes (Comment) Care giver support system in place?: Yes (comment)   Criminal Activity/Legal Involvement Pertinent to Current Situation/Hospitalization: No - Comment as needed  Activities of Daily Living Home Assistive Devices/Equipment: Walker (specify type), Eyeglasses ADL Screening (condition at time of admission) Patient's cognitive ability adequate to safely complete daily activities?: Yes Is the patient deaf or have difficulty hearing?:  No Does the patient have difficulty seeing, even when wearing glasses/contacts?: No Does the patient have difficulty concentrating, remembering, or making decisions?: No Patient able to express need for assistance with ADLs?: Yes Does the patient have difficulty dressing or bathing?: Yes Independently performs ADLs?: No Communication: Independent Dressing (OT): Needs assistance Is this a change from baseline?: Change from baseline, expected to last <3days Grooming: Independent Feeding: Independent Bathing: Needs assistance Is this a change from baseline?: Change from baseline, expected to last <3 days Toileting: Needs assistance Is this a change from baseline?: Change from baseline, expected to last <3 days In/Out Bed: Needs assistance Is this a change from baseline?: Change from baseline, expected to last <3 days Walks in Home: Needs assistance Is this a change from baseline?: Change from baseline, expected to last <3 days Does the patient have difficulty walking or climbing stairs?: Yes Weakness of Legs: Both Weakness of Arms/Hands: None  Permission Sought/Granted                  Emotional Assessment       Orientation: : Oriented to Self, Oriented to Place, Oriented to  Time, Oriented to Situation Alcohol / Substance Use: Not Applicable Psych Involvement: No (comment)  Admission diagnosis:  Knee pain, bilateral [M25.561, M25.562] Acute pain of both knees [M25.561, M25.562] Patient Active Problem List   Diagnosis Date Noted   Knee pain, bilateral 01/29/2021  Unable to walk 01/29/2021   Cellulitis, finger 12/07/2018   Gouty arthritis 12/07/2018   Osteomyelitis ([place]) 12/07/2018   Leukocytosis 12/07/2018   Essential hypertension 12/07/2018   Anemia of chronic disease 12/07/2018   Atrial fibrillation with RVR ([place]) 01/28/2013   CAD (coronary artery disease) of artery bypass graft 01/28/2013   Chronic kidney disease, stage 3b ([place]) 01/28/2013   Ureteral cancer  ([place]) 01/24/2013   PCP:  [name], MD Pharmacy:   [id number] - [place], [place] [address] [place] [address] Phone: [phone] Fax: [phone]  [facility] [place], [place] - [address] [place] [address] Phone: [phone] Fax: [phone]     Social Determinants of Health (SDOH) Interventions    Readmission Risk Interventions No flowsheet data found.

## 2021-02-01 NOTE — Assessment & Plan Note (Addendum)
-   differential includes atelectasis due to prolonged hospitalization with decreased mobility vs VTE (family reports he's been less mobile for up to 2 weeks at home b/c of his knee pains) - LE duplex negative for DVT which is reassuring - d-dimer expected to be elevated some in setting of gout flare but his value is higher than anticipated; given renal function unable to obtain CTA chest safely; will therefore order VQ scan to further help determine clot probability and if anticoagulation is needing to be considered.  Scan performed on 02/02/2021 and negative for perfusion defects making PE probability low at this time -Patient also repeated walk test on 02/02/2021 with no further desaturations after he has been encouraged for incentive spirometer use since yesterday.  Patient encouraged to continue incentive spirometer at discharge and remain more ambulatory/mobile

## 2021-02-01 NOTE — Progress Notes (Signed)
Nutrition Brief Note  Patient identified on the Malnutrition Screening Tool (MST) Report  Spoke with pt's family member at door as pt on bedside commode. He reports pt has been eating 2 meals per day consistent with his usually diet. Suspects he may have had slight decrease in PO intake d/t pt with constipation for about 1 week. He has been consuming his Ensure daily and encouraged to continue to drink them.   Noted pt to d/c to home with home health service.  Wt Readings from Last 15 Encounters:  01/29/21 52.2 kg  04/17/18 46.7 kg  09/26/16 68 kg  06/02/16 67 kg  01/31/16 46.7 kg  02/22/13 67.2 kg  01/25/13 69.9 kg  01/18/13 69.9 kg  12/01/12 73.9 kg    Body mass index is 19.74 kg/m. Patient meets criteria for normal weight based on current BMI.   Current diet order is Heart Healthy, patient is consuming approximately 50-75% of meals at this time. Labs and medications reviewed.   No nutrition interventions warranted at this time. If nutrition issues arise, please consult RD.   Clayborne Dana, RDN, LDN Clinical Nutrition

## 2021-02-01 NOTE — Progress Notes (Signed)
Physical Therapy Treatment Patient Details Name: Angel Cooper MRN: [id number] DOB: 01/09/1931 Today's Date: 02/01/2021   History of Present Illness Angel Cooper is a 88 y.o. male who presents for evaluation of bilateral knee pain.  He reports he has had knee pain for a long time but in the last few days has become acutely worse to the point that he is not able to stand up or ambulate. Ortho consulted and aspirated  and injected both knees on 1/4; with medical history significant for HTN, Gout, CKD 3, OA, CAD s/p CABG    PT Comments    Pt continues to only be able to ambulate short distances at a time due to LE discomfort and SOB.  Pt's greatest difficulty is with sit > stand, however, he states he has a lift chair available to him at home that he can use.  Angel Cooper is present and reports that Angel Cooper is retired and should be able to help assist pt at home.  Pt would benefit from continued transfer practice and increasing amb distance to tolerance.  Safe to return home with help from family and [facility] PT.  Recommendations for follow up therapy are one component of a multi-disciplinary discharge planning process, led by the attending physician.  Recommendations may be updated based on patient status, additional functional criteria and insurance authorization.  Follow Up Recommendations  Home health PT     Assistance Recommended at Discharge Intermittent Supervision/Assistance  Patient can return home with the following A little help with walking and/or transfers;A little help with bathing/dressing/bathroom;Assistance with cooking/housework;Help with stairs or ramp for entrance   Equipment Recommendations       Recommendations for Other Services       Precautions / Restrictions       Mobility  Bed Mobility                 Patient Response: Cooperative  Transfers Overall transfer level: Needs assistance Equipment used:  (7OZ) Transfers: Sit to/from Stand;Bed to  chair/wheelchair/BSC Sit to Stand: Mod assist;Min assist     Step pivot transfers: Supervision     General transfer comment: Pt. performs sit > stand x 3 during course of treatment.  1st attempt, pt. pushes up on 3WW and req's mod A for safety.  PT discusses importance of pushing up from surface he is standing from for safety.  Pt. practices one more time standing with 1 hand on 3WW and 1 on bed, only req's min A but takes inc time to complete.  Last sit > stand is complete with bed slightly elevated and both hands on bed, able to complete with CGA and inc time.  On final sit > stand pt. transfers to commode with supervision in attempt to have BM.  VCs for safety with hand placement, reaching back to control descent.    Ambulation/Gait Ambulation/Gait assistance: Supervision Gait Distance (Feet): 30 Feet Assistive device:  (3WW) Gait Pattern/deviations: Decreased step length - right;Decreased step length - left       General Gait Details: Pt. amb around room with slow cadence, dec step length, and forward flexed posture. CGA from PT for safety.  Pt. c/o fatigue/SOB limiting gait distance.  O2 sats are check on portable pulse ox and reading is 76%.  Pt. is educated on PLB technique and O2 slowly recovers but only to 86%.  PT attempts to find large vitals machine to double check reading, but takes inc time to find available machine.  By  the time a second reading occurs, O2 sats are back to 100%.  Unsure if initial reading was false reading.  NSG notified.   Stairs             Wheelchair Mobility    Modified Rankin (Stroke Patients Only)       Balance Overall balance assessment: Mild deficits observed, not formally tested                                          Cognition Arousal/Alertness: Awake/alert Behavior During Therapy: WFL for tasks assessed/performed Overall Cognitive Status: Within Functional Limits for tasks assessed                                  General Comments: Pt. is sitting up EOB, Cooper is in room.  Per NSG, pt. has had multiple laxatives due to constipation.  Pt is agreeable to stay in room in case he needs to use the bathroom and amb around room.        Exercises      General Comments General comments (skin integrity, edema, etc.): Cooper is present in room.  Discussed who would be available to assist pt on return home as pt. is still requiring min A.  Cooper states that he works full time but that his Cooper is retired and might be able to help.      Pertinent Vitals/Pain Pain Assessment: 0-10 Pain Score: 3  Pain Location: B knees, states they are stiff Pain Descriptors / Indicators: Aching Pain Intervention(s): Limited activity within patient's tolerance;Monitored during session    Home Living                          Prior Function            PT Goals (current goals can now be found in the care plan section) Progress towards PT goals: Progressing toward goals    Frequency    Min 4X/week      PT Plan Current plan remains appropriate    Co-evaluation              AM-PAC PT "6 Clicks" Mobility   Outcome Measure  Help needed turning from your back to your side while in a flat bed without using bedrails?: A Little Help needed moving from lying on your back to sitting on the side of a flat bed without using bedrails?: A Little Help needed moving to and from a bed to a chair (including a wheelchair)?: A Little Help needed standing up from a chair using your arms (e.g., wheelchair or bedside chair)?: A Little Help needed to walk in hospital room?: A Little Help needed climbing 3-5 steps with a railing? : A Lot 6 Click Score: 17    End of Session Equipment Utilized During Treatment: Gait belt Activity Tolerance: Patient tolerated treatment well;Patient limited by fatigue Patient left: Other (comment);with call bell/phone within reach (Left on [facility], understands to push call bell  when done.  NSG notified.) Nurse Communication: Mobility status       Time: 6283-1517 PT Time Calculation (min) (ACUTE ONLY): 34 min  Charges:  $Gait Training: 8-22 mins $Therapeutic Activity: 8-22 mins  Fredderick Swanger A. Caspar Favila, PT, DPT Acute Rehabilitation Services Office: [place] 02/01/2021, 11:55 AM

## 2021-02-01 NOTE — Plan of Care (Signed)

## 2021-02-01 NOTE — Progress Notes (Signed)
Progress Note    Angel Cooper   ZHY:[id number]  DOB: November 11, 1930  DOA: 01/29/2021     0 PCP: [name], MD  Initial CC: knee pain  Hospital Course: [name] is an 88 year old male with PMH gout, PAF, CAD s/p CABG, CHF, CKD, GERD, HLD, HTN who presented with worsening bilateral knee pain and swelling.  Symptoms have been progressively worsening for the past several weeks prior to admission.  He was recently increased to 300 mg daily of allopurinol.  He has also undergone prior steroid injection to his knee as well as a prednisone course for presumed gout flare within the past 2 months.  He endorsed not having much relief from prior steroid injection. He was admitted for further work-up of his knee pain and swelling.  Interval History:  No events overnight.  He has been ambulating better today compared to yesterday.  Upon attempting to have a bowel movement earlier today, he was apparently straining rather hard and was significantly out of breath afterwards.  However, prior to this he had also ambulated in the hall and had a large drop in oxygen level as well requiring 2 L O2.  Unfortunately, this now must be further worked up which will continue patient's hospitalization 1 more night.  Assessment & Plan: * Knee pain, bilateral- (present on admission) - Knee x-rays on admission showed moderate to advanced tricompartmental degenerative changes - continue ongoing pain control. Appears to have had relief from toradol as well on admission  -Evaluated by PT with recommendations for home health  Acute respiratory failure with hypoxia (HCC) - differential includes atelectasis due to prolonged hospitalization with decreased mobility vs VTE (family reports he's been less mobile for up to 2 weeks at home b/c of his knee pains) - LE duplex negative for DVT which is reassuring - d-dimer expected to be elevated some in setting of gout flare but his value is higher than anticipated; given renal  function unable to obtain CTA chest safely; will therefore order VQ scan to further help determine clot probability and if anticoagulation is needing to be considered  Gouty arthritis- (present on admission) - Knee x-rays on admission showed moderate to advanced tricompartmental degenerative changes -Evaluated by orthopedic surgery after admission and underwent bilateral arthrocentesis - results seem consistent with gout/inflammation and noted to have intracellular monosodium urate crystals on analysis as well; he underwent steroid injection with Depo-Medrol after arthrocentesis and fluid analysis -Follow-up cultures, NGTD -Continue Toradol as needed as well -Uric acid level 8.1 on admission.  Allopurinol dose renally adjusted -Mobility has improved further today and he does demonstrate more independence in terms of being able to go home however when ambulating he became acutely dyspneic with hypoxia which is also being worked up at this point now  Leukocytosis- (present on admission) - Presumed reactive in setting of gout flare - Follow-up arthrocentesis culture, still negative to date  Essential hypertension- (present on admission) - Continue amlodipine  Chronic kidney disease, stage 3b ([place])- (present on admission) - patient has history of CKD3b. Baseline creat ~ 1.5, eGFR 40 - renally adjust meds as needed     Old records reviewed in assessment of this patient  Antimicrobials:   DVT prophylaxis: Lovenox  Code Status:   Code Status: Full Code  Disposition Plan: Home with home health Status is: Observation  Objective: Blood pressure 125/60, pulse 96, temperature 97.6 F (36.4 C), temperature source Oral, resp. rate 19, height 5' 4"  (1.626 m), weight 52.2 kg, SpO2 95 %.  Examination:  Physical Exam Constitutional:      General: He is not in acute distress.    Appearance: Normal appearance.  HENT:     Head: Normocephalic and atraumatic.     Mouth/Throat:     Mouth:  Mucous membranes are moist.  Eyes:     Extraocular Movements: Extraocular movements intact.  Cardiovascular:     Rate and Rhythm: Normal rate and regular rhythm.     Heart sounds: Normal heart sounds.  Pulmonary:     Effort: Pulmonary effort is normal.     Comments: Scattered coarse sounds bilaterally  Abdominal:     General: Bowel sounds are normal. There is no distension.     Palpations: Abdomen is soft.     Tenderness: There is no abdominal tenderness.  Musculoskeletal:     Cervical back: Normal range of motion and neck supple.     Comments: Bilateral knee swelling showing some improvement.  Bilateral legs are showing worsening edema however, 2+  Skin:    General: Skin is warm and dry.  Neurological:     General: No focal deficit present.     Mental Status: He is alert.  Psychiatric:        Mood and Affect: Mood normal.        Behavior: Behavior normal.     Consultants:  Orthopedic surgery  Procedures:  Bilateral knee arthrocentesis, 01/30/2021  Data Reviewed: I have personally reviewed labs and imaging studies    LOS: 0 days  Time spent: Greater than 50% of the 35 minute visit was spent in counseling/coordination of care for the patient as laid out in the A&P.   Angel Dee, MD [facility] 02/01/2021, 4:59 PM

## 2021-02-01 NOTE — Progress Notes (Addendum)
Attending MD has been informed of pt's current status in regards with his constipation, and pt is requesting for a suppository, Per Pt he wont go home till he has a BM , and with his desat concerns. Pt stated " I dont think I need an oxygen". Home oxygen has been offered to go with pt to home, but pt refused. Pt currently on room air at 93% with no c/o SOB. VSS( see flowsheets). Pt remains alert/oriented in no apparent distress, denies any discomfort at this time. Closely monitored.

## 2021-02-01 NOTE — Progress Notes (Signed)
This Probation officer has been informed by staff that pt had blood in his stools. Pt seen in room defecating in  [facility] and no blood in stool noted nor any blood comin from the rectum. Pt denies any bleeding /discomfort at this time, No complaints voiced.  Pt closley monitored.Night shift nurse made aware of above.

## 2021-02-02 ENCOUNTER — Inpatient Hospital Stay (HOSPITAL_COMMUNITY): Payer: Medicare HMO

## 2021-02-02 DIAGNOSIS — E785 Hyperlipidemia, unspecified: Secondary | ICD-10-CM | POA: Diagnosis present

## 2021-02-02 DIAGNOSIS — M1990 Unspecified osteoarthritis, unspecified site: Secondary | ICD-10-CM | POA: Diagnosis present

## 2021-02-02 DIAGNOSIS — Z20822 Contact with and (suspected) exposure to covid-19: Secondary | ICD-10-CM | POA: Diagnosis present

## 2021-02-02 DIAGNOSIS — R0602 Shortness of breath: Secondary | ICD-10-CM | POA: Diagnosis not present

## 2021-02-02 DIAGNOSIS — J9601 Acute respiratory failure with hypoxia: Secondary | ICD-10-CM | POA: Diagnosis not present

## 2021-02-02 DIAGNOSIS — I2510 Atherosclerotic heart disease of native coronary artery without angina pectoris: Secondary | ICD-10-CM | POA: Diagnosis present

## 2021-02-02 DIAGNOSIS — Z79899 Other long term (current) drug therapy: Secondary | ICD-10-CM | POA: Diagnosis not present

## 2021-02-02 DIAGNOSIS — D75839 Thrombocytosis, unspecified: Secondary | ICD-10-CM | POA: Diagnosis present

## 2021-02-02 DIAGNOSIS — Z87891 Personal history of nicotine dependence: Secondary | ICD-10-CM | POA: Diagnosis not present

## 2021-02-02 DIAGNOSIS — I252 Old myocardial infarction: Secondary | ICD-10-CM | POA: Diagnosis not present

## 2021-02-02 DIAGNOSIS — Z886 Allergy status to analgesic agent status: Secondary | ICD-10-CM | POA: Diagnosis not present

## 2021-02-02 DIAGNOSIS — I129 Hypertensive chronic kidney disease with stage 1 through stage 4 chronic kidney disease, or unspecified chronic kidney disease: Secondary | ICD-10-CM | POA: Diagnosis present

## 2021-02-02 DIAGNOSIS — G8929 Other chronic pain: Secondary | ICD-10-CM | POA: Diagnosis not present

## 2021-02-02 DIAGNOSIS — Z905 Acquired absence of kidney: Secondary | ICD-10-CM | POA: Diagnosis not present

## 2021-02-02 DIAGNOSIS — Z951 Presence of aortocoronary bypass graft: Secondary | ICD-10-CM | POA: Diagnosis not present

## 2021-02-02 DIAGNOSIS — M1A9XX0 Chronic gout, unspecified, without tophus (tophi): Secondary | ICD-10-CM | POA: Diagnosis present

## 2021-02-02 DIAGNOSIS — Z8554 Personal history of malignant neoplasm of ureter: Secondary | ICD-10-CM | POA: Diagnosis not present

## 2021-02-02 DIAGNOSIS — Z961 Presence of intraocular lens: Secondary | ICD-10-CM | POA: Diagnosis present

## 2021-02-02 DIAGNOSIS — N1832 Chronic kidney disease, stage 3b: Secondary | ICD-10-CM | POA: Diagnosis present

## 2021-02-02 DIAGNOSIS — M25562 Pain in left knee: Secondary | ICD-10-CM | POA: Diagnosis not present

## 2021-02-02 DIAGNOSIS — Z79891 Long term (current) use of opiate analgesic: Secondary | ICD-10-CM | POA: Diagnosis not present

## 2021-02-02 DIAGNOSIS — Z888 Allergy status to other drugs, medicaments and biological substances status: Secondary | ICD-10-CM | POA: Diagnosis not present

## 2021-02-02 DIAGNOSIS — I480 Paroxysmal atrial fibrillation: Secondary | ICD-10-CM | POA: Diagnosis present

## 2021-02-02 DIAGNOSIS — K219 Gastro-esophageal reflux disease without esophagitis: Secondary | ICD-10-CM | POA: Diagnosis present

## 2021-02-02 DIAGNOSIS — M109 Gout, unspecified: Secondary | ICD-10-CM | POA: Diagnosis present

## 2021-02-02 DIAGNOSIS — D72829 Elevated white blood cell count, unspecified: Secondary | ICD-10-CM | POA: Diagnosis present

## 2021-02-02 DIAGNOSIS — M25561 Pain in right knee: Secondary | ICD-10-CM | POA: Diagnosis not present

## 2021-02-02 LAB — CBC WITH DIFFERENTIAL/PLATELET
Abs Immature Granulocytes: 0.23 10*3/uL — ABNORMAL HIGH (ref 0.00–0.07)
Basophils Absolute: 0 10*3/uL (ref 0.0–0.1)
Basophils Relative: 0 %
Eosinophils Absolute: 0 10*3/uL (ref 0.0–0.5)
Eosinophils Relative: 0 %
HCT: 24.8 % — ABNORMAL LOW (ref 39.0–52.0)
Hemoglobin: 8.4 g/dL — ABNORMAL LOW (ref 13.0–17.0)
Immature Granulocytes: 1 %
Lymphocytes Relative: 4 %
Lymphs Abs: 0.7 10*3/uL (ref 0.7–4.0)
MCH: 29.7 pg (ref 26.0–34.0)
MCHC: 33.9 g/dL (ref 30.0–36.0)
MCV: 87.6 fL (ref 80.0–100.0)
Monocytes Absolute: 1.1 10*3/uL — ABNORMAL HIGH (ref 0.1–1.0)
Monocytes Relative: 5 %
Neutro Abs: 18.8 10*3/uL — ABNORMAL HIGH (ref 1.7–7.7)
Neutrophils Relative %: 90 %
Platelets: 596 10*3/uL — ABNORMAL HIGH (ref 150–400)
RBC: 2.83 MIL/uL — ABNORMAL LOW (ref 4.22–5.81)
RDW: 15.9 % — ABNORMAL HIGH (ref 11.5–15.5)
WBC: 20.9 10*3/uL — ABNORMAL HIGH (ref 4.0–10.5)
nRBC: 0.1 % (ref 0.0–0.2)

## 2021-02-02 LAB — BODY FLUID CULTURE W GRAM STAIN
Culture: NO GROWTH
Culture: NO GROWTH

## 2021-02-02 LAB — BASIC METABOLIC PANEL
Anion gap: 11 (ref 5–15)
BUN: 64 mg/dL — ABNORMAL HIGH (ref 8–23)
CO2: 22 mmol/L (ref 22–32)
Calcium: 8.5 mg/dL — ABNORMAL LOW (ref 8.9–10.3)
Chloride: 93 mmol/L — ABNORMAL LOW (ref 98–111)
Creatinine, Ser: 1.92 mg/dL — ABNORMAL HIGH (ref 0.61–1.24)
GFR, Estimated: 33 mL/min — ABNORMAL LOW (ref >60)
Glucose, Bld: 128 mg/dL — ABNORMAL HIGH (ref 70–99)
Potassium: 4.7 mmol/L (ref 3.5–5.1)
Sodium: 126 mmol/L — ABNORMAL LOW (ref 135–145)

## 2021-02-02 LAB — MAGNESIUM: Magnesium: 2.1 mg/dL (ref 1.7–2.4)

## 2021-02-02 MED ORDER — TECHNETIUM TO 99M ALBUMIN AGGREGATED
4.4000 | Freq: Once | INTRAVENOUS | Status: AC | PRN
  Administered 2021-02-02: 4.4 via INTRAVENOUS

## 2021-02-02 NOTE — Progress Notes (Signed)
Discharge instructions given to patient and son. IV removed. All questions answered. Patient transport in wheelchair to lobby by staff.

## 2021-02-02 NOTE — Discharge Summary (Signed)
Physician Discharge Summary   CHIP CANEPA DVV:[id number] DOB: [birth year] DOA: 01/29/2021  PCP: [name], MD  Admit date: 01/29/2021 Discharge date:  02/02/2021  Admitted From: home Disposition:  Home with [facility] Discharging physician: [name], MD  Recommendations for Outpatient Follow-up:  Consider repeat echo for updated study  Repeat BMP. May need lasix adjustment   Home Health: PT Equipment/Devices:   Discharge Condition: stable CODE STATUS: Full Diet recommendation:  Diet Orders (From admission, onward)     Start     Ordered   02/02/21 0000  Diet - low sodium heart healthy        02/02/21 1409   02/01/21 0000  Diet - low sodium heart healthy        02/01/21 1000   01/30/21 0040  Diet Heart Room service appropriate? Yes; Fluid consistency: Thin  Diet effective now       Question Answer Comment  Room service appropriate? Yes   Fluid consistency: Thin      01/30/21 0039            Hospital Course: [name] is an 88 year old male with PMH gout, PAF, CAD s/p CABG, CHF, CKD, GERD, HLD, HTN who presented with worsening bilateral knee pain and swelling.  Symptoms have been progressively worsening for the past several weeks prior to admission.  He was recently increased to 300 mg daily of allopurinol.  He has also undergone prior steroid injection to his knee as well as a prednisone course for presumed gout flare within the past 2 months.  He endorsed not having much relief from prior steroid injection. He was admitted for further work-up of his knee pain and swelling.  * Knee pain, bilateral- (present on admission) - Knee x-rays on admission showed moderate to advanced tricompartmental degenerative changes - continue ongoing pain control. Appears to have had relief from toradol as well on admission  -Evaluated by PT with recommendations for home health  Gouty arthritis- (present on admission) - Knee x-rays on admission showed moderate to advanced  tricompartmental degenerative changes -Evaluated by orthopedic surgery after admission and underwent bilateral arthrocentesis - results seem consistent with gout/inflammation and noted to have intracellular monosodium urate crystals on analysis as well; he underwent steroid injection with Depo-Medrol after arthrocentesis and fluid analysis -Follow-up cultures, NGTD -Continue Toradol as needed as well -Uric acid level 8.1 on admission.  Allopurinol dose renally adjusted -Mobility slowly improved during hospitalization and he was felt stable and safe for discharging home with home health in place  Acute respiratory failure with hypoxia (HCC)-resolved as of 02/02/2021 - differential includes atelectasis due to prolonged hospitalization with decreased mobility vs VTE (family reports he's been less mobile for up to 2 weeks at home b/c of his knee pains) - LE duplex negative for DVT which is reassuring - d-dimer expected to be elevated some in setting of gout flare but his value is higher than anticipated; given renal function unable to obtain CTA chest safely; will therefore order VQ scan to further help determine clot probability and if anticoagulation is needing to be considered.  Scan performed on 02/02/2021 and negative for perfusion defects making PE probability low at this time -Patient also repeated walk test on 02/02/2021 with no further desaturations after he has been encouraged for incentive spirometer use since yesterday.  Patient encouraged to continue incentive spirometer at discharge and remain more ambulatory/mobile  Leukocytosis- (present on admission) - Presumed reactive in setting of gout flare - Follow-up arthrocentesis culture, still negative  to date  Essential hypertension- (present on admission) - Continue amlodipine  Chronic kidney disease, stage 3b ([place])- (present on admission) - patient has history of CKD3b. Baseline creat ~ 1.5, eGFR 40 - renally adjust meds as  needed     The patient's chronic medical conditions were treated accordingly per the patient's home medication regimen except as noted.  On day of discharge, patient was felt deemed stable for discharge. Patient/family member advised to call PCP or come back to ER if needed.   Principal Diagnosis: Knee pain, bilateral  Discharge Diagnoses: Principal Problem:   Knee pain, bilateral Active Problems:   Gouty arthritis   Leukocytosis   Chronic kidney disease, stage 3b (HCC)   Essential hypertension   Discharge Instructions     Diet - low sodium heart healthy   Complete by: As directed    Diet - low sodium heart healthy   Complete by: As directed    Increase activity slowly   Complete by: As directed    Increase activity slowly   Complete by: As directed       Allergies as of 02/02/2021       Reactions   Allopurinol    Other reaction(s): myalgias/diarhea   Aspirin Other (See Comments)   Stomach pain    Benadryl [diphenhydramine Hcl (sleep)] Hives   Chlorhexidine Other (See Comments)   CAUSED SKIN IRRITATION AND BURNING SENSATION   Enalapril Other (See Comments)   Cannot take due to kidney compromise   Gabapentin Other (See Comments)   Caused nightmares        Medication List     STOP taking these medications    colchicine 0.6 MG tablet   traMADol 50 MG tablet Commonly known as: ULTRAM       TAKE these medications    acetaminophen 500 MG tablet Commonly known as: TYLENOL Take 1,000 mg by mouth every 6 (six) hours as needed for mild pain or headache.   allopurinol 100 MG tablet Commonly known as: ZYLOPRIM Take 0.5 tablets (50 mg total) by mouth daily. What changed:  medication strength how much to take Another medication with the same name was removed. Continue taking this medication, and follow the directions you see here.   ALPRAZolam 0.25 MG tablet Commonly known as: XANAX Take 0.25 mg by mouth at bedtime as needed for sleep.   amLODipine 5  MG tablet Commonly known as: NORVASC Take 5 mg by mouth every morning.   B-12 Compliance Injection 1000 MCG/ML Kit Generic drug: Cyanocobalamin Inject 1,000 mcg as directed every 30 (thirty) days.   cyproheptadine 4 MG tablet Commonly known as: PERIACTIN Take 4 mg by mouth at bedtime as needed.   furosemide 40 MG tablet Commonly known as: LASIX Take 80 mg by mouth every morning.   HYDROcodone-acetaminophen 5-325 MG tablet Commonly known as: NORCO/VICODIN Take 1 tablet by mouth every 6 (six) hours as needed for severe pain. What changed:  when to take this reasons to take this   omeprazole 20 MG capsule Commonly known as: PRILOSEC Take 20 mg by mouth every morning.   predniSONE 20 MG tablet Commonly known as: DELTASONE Take 2 tablets (40 mg total) by mouth daily with breakfast for 5 days.   psyllium 58.6 % powder Commonly known as: METAMUCIL Take 1 packet by mouth daily as needed (constipation).   triamcinolone cream 0.1 % Commonly known as: KENALOG Apply 1 application topically See admin instructions. Apply to affected area(s) up to 2 times a day as  needed for itching and AVOID UNDERARMS, FACE, and [place]  (From admission, onward)           Start     Ordered   02/01/21 1258  For home use only DME oxygen  Once       Question Answer Comment  Length of Need 6 Months   Mode or (Route) Nasal cannula   Liters per Minute 2   Frequency Continuous (stationary and portable oxygen unit needed)   Oxygen conserving device Yes   Oxygen delivery system Gas      02/01/21 1257            Follow-up Information     [name], MD Follow up.   Specialty: Internal Medicine Contact information: [address], [address] [place] [address] [phone]                Allergies  Allergen Reactions   Allopurinol     Other reaction(s): myalgias/diarhea   Aspirin Other (See Comments)    Stomach  pain    Benadryl [Diphenhydramine Hcl (Sleep)] Hives   Chlorhexidine Other (See Comments)    CAUSED SKIN IRRITATION AND BURNING SENSATION   Enalapril Other (See Comments)    Cannot take due to kidney compromise   Gabapentin Other (See Comments)    Caused nightmares    Consultations:   Discharge Exam: BP (!) 129/50 (BP Location: Right Arm)    Pulse (!) 44    Temp 98.4 F (36.9 C) (Oral)    Resp 17    Ht 5' 4"  (1.626 m)    Wt 52.2 kg    SpO2 93%    BMI 19.74 kg/m  Physical Exam Constitutional:      General: He is not in acute distress.    Appearance: Normal appearance.  HENT:     Head: Normocephalic and atraumatic.     Mouth/Throat:     Mouth: Mucous membranes are moist.  Eyes:     Extraocular Movements: Extraocular movements intact.  Cardiovascular:     Rate and Rhythm: Normal rate and regular rhythm.     Heart sounds: Normal heart sounds.  Pulmonary:     Effort: Pulmonary effort is normal.     Breath sounds: No wheezing.     Comments: Improved sounds, minimally coarse Abdominal:     General: Bowel sounds are normal. There is no distension.     Palpations: Abdomen is soft.     Tenderness: There is no abdominal tenderness.  Musculoskeletal:     Cervical back: Normal range of motion and neck supple.     Comments: Bilateral knee swelling showing some improvement.  Bilateral legs are showing worsening edema however, 2+  Skin:    General: Skin is warm and dry.  Neurological:     General: No focal deficit present.     Mental Status: He is alert.  Psychiatric:        Mood and Affect: Mood normal.        Behavior: Behavior normal.     The results of significant diagnostics from this hospitalization (including imaging, microbiology, ancillary and laboratory) are listed below for reference.   Microbiology: Recent Results (from the past 240 hour(s))  Body fluid culture w Gram Stain     Status: None (Preliminary result)   Collection Time: 01/30/21 12:48 PM   Specimen:  Synovium; Body Fluid  Result  Value Ref Range Status   Specimen Description SYNOVIAL FLUID  Final   Special Requests LEFT KNEE  Final   Gram Stain   Final    MODERATE WBC PRESENT,BOTH PMN AND MONONUCLEAR NO ORGANISMS SEEN    Culture   Final    NO GROWTH 3 DAYS Performed at [facility], [address], [place], [place] [address]    Report Status PENDING  Incomplete  Body fluid culture w Gram Stain     Status: None (Preliminary result)   Collection Time: 01/30/21 12:48 PM   Specimen: Synovium; Body Fluid  Result Value Ref Range Status   Specimen Description SYNOVIAL FLUID  Final   Special Requests RIGHT KNEE  Final   Gram Stain   Final    MODERATE WBC PRESENT,BOTH PMN AND MONONUCLEAR NO ORGANISMS SEEN    Culture   Final    NO GROWTH 3 DAYS Performed at [facility], [address] [place], [place] [address]    Report Status PENDING  Incomplete     Labs: BNP (last 3 results) No results for input(s): BNP in the last 8760 hours. Basic Metabolic Panel: Recent Labs  Lab 01/29/21 1159 01/30/21 0227 01/31/21 0249 02/01/21 0148 02/02/21 0242  NA 133* 134* 134* 129* 126*  K 3.5 3.8 4.4 4.7 4.7  CL 94* 98 99 93* 93*  CO2 26 26 24 26 22   GLUCOSE 121* 106* 155* 185* 128*  BUN 25* 25* 32* 60* 64*  CREATININE 1.57* 1.59* 1.58* 2.28* 1.92*  CALCIUM 8.6* 8.4* 8.4* 8.3* 8.5*  MG  --   --  1.9 2.0 2.1   Liver Function Tests: Recent Labs  Lab 01/29/21 1159  AST 26  ALT 11  ALKPHOS 68  BILITOT 0.8  PROT 6.8  ALBUMIN 3.2*   No results for input(s): LIPASE, AMYLASE in the last 168 hours. No results for input(s): AMMONIA in the last 168 hours. CBC: Recent Labs  Lab 01/29/21 1159 01/30/21 0227 01/31/21 0249 02/01/21 0148 02/02/21 0242  WBC 16.6* 15.7* 13.7* 17.8* 20.9*  NEUTROABS 13.7*  --  12.7* 16.1* 18.8*  HGB 9.9* 8.7* 9.2* 8.9* 8.4*  HCT 30.0* 25.6* 27.6* 26.7* 24.8*  MCV 89.3 89.2 89.6 88.7 87.6  PLT 564* 454* 467* 564* 596*   Cardiac  Enzymes: Recent Labs  Lab 01/29/21 1159  CKTOTAL 198   BNP: Invalid input(s): POCBNP CBG: Recent Labs  Lab 02/01/21 1241  GLUCAP 156*   D-Dimer Recent Labs    02/01/21 1555  DDIMER 17.95*   Hgb A1c No results for input(s): HGBA1C in the last 72 hours. Lipid Profile No results for input(s): CHOL, HDL, LDLCALC, TRIG, CHOLHDL, LDLDIRECT in the last 72 hours. Thyroid function studies No results for input(s): TSH, T4TOTAL, T3FREE, THYROIDAB in the last 72 hours.  Invalid input(s): FREET3 Anemia work up No results for input(s): VITAMINB12, FOLATE, FERRITIN, TIBC, IRON, RETICCTPCT in the last 72 hours. Urinalysis    Component Value Date/Time   COLORURINE YELLOW 01/29/2021 1150   APPEARANCEUR CLEAR 01/29/2021 1150   LABSPEC 1.015 01/29/2021 1150   PHURINE 5.5 01/29/2021 1150   GLUCOSEU NEGATIVE 01/29/2021 1150   HGBUR NEGATIVE 01/29/2021 1150   BILIRUBINUR NEGATIVE 01/29/2021 1150   KETONESUR NEGATIVE 01/29/2021 1150   PROTEINUR NEGATIVE 01/29/2021 1150   UROBILINOGEN 0.2 01/28/2013 1204   NITRITE NEGATIVE 01/29/2021 1150   LEUKOCYTESUR NEGATIVE 01/29/2021 1150   Sepsis Labs Invalid input(s): PROCALCITONIN,  WBC,  LACTICIDVEN Microbiology Recent Results (from the past  240 hour(s))  Body fluid culture w Gram Stain     Status: None (Preliminary result)   Collection Time: 01/30/21 12:48 PM   Specimen: Synovium; Body Fluid  Result Value Ref Range Status   Specimen Description SYNOVIAL FLUID  Final   Special Requests LEFT KNEE  Final   Gram Stain   Final    MODERATE WBC PRESENT,BOTH PMN AND MONONUCLEAR NO ORGANISMS SEEN    Culture   Final    NO GROWTH 3 DAYS Performed at [facility], [address], [place], [place] [address]    Report Status PENDING  Incomplete  Body fluid culture w Gram Stain     Status: None (Preliminary result)   Collection Time: 01/30/21 12:48 PM   Specimen: Synovium; Body Fluid  Result Value Ref Range Status   Specimen Description  SYNOVIAL FLUID  Final   Special Requests RIGHT KNEE  Final   Gram Stain   Final    MODERATE WBC PRESENT,BOTH PMN AND MONONUCLEAR NO ORGANISMS SEEN    Culture   Final    NO GROWTH 3 DAYS Performed at [facility], [address], [place], [place] [address]    Report Status PENDING  Incomplete    Procedures/Studies: NM Pulmonary Perf and Vent  Result Date: 02/02/2021 CLINICAL DATA:  Hypoxic respiratory failure.  Shortness of breath. EXAM: NUCLEAR MEDICINE PERFUSION LUNG SCAN TECHNIQUE: Perfusion images were obtained in multiple projections after intravenous injection of radiopharmaceutical. Ventilation scans intentionally deferred if perfusion scan and chest x-ray adequate for interpretation during COVID 19 epidemic. RADIOPHARMACEUTICALS:  4.4 mCi Tc-31mMAA IV COMPARISON:  Chest x-ray February 01, 2021 FINDINGS: No suspicious segmental defects identified in either lung. IMPRESSION: The study is limited without ventilation imaging. Within these limitations, there is no evidence of pulmonary embolus on this study. Electronically Signed   By: DDorise BullionIII M.D.   On: 02/02/2021 13:48   DG CHEST PORT 1 VIEW  Result Date: 02/01/2021 CLINICAL DATA:  Acute respiratory failure with hypoxia. EXAM: PORTABLE CHEST 1 VIEW COMPARISON:  April 17, 2018. FINDINGS: Stable cardiomegaly. Status post coronary bypass graft. Mildly increased bibasilar subsegmental atelectasis or edema is noted with probable small left pleural effusion. Bony thorax is unremarkable. IMPRESSION: Mildly increased bibasilar subsegmental atelectasis or edema is noted with probable small left pleural effusion. Electronically Signed   By: JMarijo ConceptionM.D.   On: 02/01/2021 14:23   DG Knee Complete 4 Views Left  Result Date: 01/29/2021 CLINICAL DATA:  Left knee pain and swelling EXAM: LEFT KNEE - COMPLETE 4+ VIEW COMPARISON:  09/26/2016 FINDINGS: Negative for fracture or bone lesion. Small to moderate joint effusion. Advanced  degenerative change in the lateral joint space with joint space narrowing and spurring. Moderate to advanced degenerative change in the patellofemoral joint. Mild degenerative spurring medially. Overall no change from prior study IMPRESSION: Advanced degenerative change with joint effusion. No acute abnormality no change from 2018. Electronically Signed   By: CFranchot GalloM.D.   On: 01/29/2021 12:31   DG Knee Complete 4 Views Right  Result Date: 01/29/2021 CLINICAL DATA:  Chronic knee pain bilaterally EXAM: RIGHT KNEE - COMPLETE 4+ VIEW COMPARISON:  None. FINDINGS: Negative for fracture.  Moderate joint effusion Tricompartmental degenerative change most severe in the medial joint space. No focal bone lesion. Atherosclerotic calcification IMPRESSION: Moderate to advanced tricompartmental degenerative change. No acute abnormality Electronically Signed   By: CFranchot GalloM.D.   On: 01/29/2021 12:44   VAS  [place] LOWER EXTREMITY VENOUS (DVT)  Result Date: 02/01/2021  Lower Venous DVT Study Patient Name:  EMERALD GEHRES  Date of Exam:   02/01/2021 Medical Rec #: [id number]          Accession #:    [id number] Date of Birth: 1930/04/17           Patient Gender: M Patient Age:   88 years Exam [facility]:  [facility] Procedure:      VAS [place] LOWER EXTREMITY VENOUS (DVT) Referring Phys: [name] --------------------------------------------------------------------------------  Indications: Edema.  Performing Technologist: Rogelia Rohrer RVT, RDMS  Examination Guidelines: A complete evaluation includes B-mode imaging, spectral Doppler, color Doppler, and power Doppler as needed of all accessible portions of each vessel. Bilateral testing is considered an integral part of a complete examination. Limited examinations for reoccurring indications may be performed as noted. The reflux portion of the exam is performed with the patient in reverse Trendelenburg.   +---------+---------------+---------+-----------+----------+--------------+  RIGHT     Compressibility Phasicity Spontaneity Properties Thrombus Aging  +---------+---------------+---------+-----------+----------+--------------+  CFV       Full            Yes       Yes                                    +---------+---------------+---------+-----------+----------+--------------+  SFJ       Full                                                             +---------+---------------+---------+-----------+----------+--------------+  FV Prox   Full            Yes       Yes                                    +---------+---------------+---------+-----------+----------+--------------+  FV Mid    Full            Yes       Yes                                    +---------+---------------+---------+-----------+----------+--------------+  FV Distal Full            Yes       Yes                                    +---------+---------------+---------+-----------+----------+--------------+  PFV       Full                                                             +---------+---------------+---------+-----------+----------+--------------+  POP       Full            Yes       Yes                                    +---------+---------------+---------+-----------+----------+--------------+  PTV       Full                                                             +---------+---------------+---------+-----------+----------+--------------+  PERO      Full                                                             +---------+---------------+---------+-----------+----------+--------------+   +---------+---------------+---------+-----------+----------+--------------+  LEFT      Compressibility Phasicity Spontaneity Properties Thrombus Aging  +---------+---------------+---------+-----------+----------+--------------+  CFV       Full            Yes       Yes                                     +---------+---------------+---------+-----------+----------+--------------+  SFJ       Full                                                             +---------+---------------+---------+-----------+----------+--------------+  FV Prox   Full            Yes       Yes                                    +---------+---------------+---------+-----------+----------+--------------+  FV Mid    Full            Yes       Yes                                    +---------+---------------+---------+-----------+----------+--------------+  FV Distal Full            Yes       Yes                                    +---------+---------------+---------+-----------+----------+--------------+  PFV       Full                                                             +---------+---------------+---------+-----------+----------+--------------+  POP       Full            Yes       Yes                                    +---------+---------------+---------+-----------+----------+--------------+  PTV       Full                                                             +---------+---------------+---------+-----------+----------+--------------+  PERO      Full                                                             +---------+---------------+---------+-----------+----------+--------------+  Soleal    Full                                                             +---------+---------------+---------+-----------+----------+--------------+  Gastroc   Full                                                             +---------+---------------+---------+-----------+----------+--------------+     Summary: BILATERAL: - No evidence of deep vein thrombosis seen in the lower extremities, bilaterally. - No evidence of superficial venous thrombosis in the lower extremities, bilaterally. -No evidence of popliteal cyst, bilaterally. Subcutaneous edema noted bilaterally.   *See table(s) above for measurements and observations. Electronically signed  by Orlie Pollen on 02/01/2021 at 7:01:00 PM.    Final      Time coordinating discharge: Over 87 minutes    [name], MD  [facility] 02/02/2021, 2:11 PM

## 2021-02-02 NOTE — Progress Notes (Signed)
SATURATION QUALIFICATIONS: (This note is used to comply with regulatory documentation for home oxygen)  Patient Saturations on Room Air at Rest = 100%  Patient Saturations on Room Air while Ambulating = 98%  Patient Saturations on 0 Liters of oxygen while Ambulating = 98%  Please briefly explain why patient needs home oxygen: no needs

## 2021-02-02 NOTE — Progress Notes (Signed)
Mobility Specialist Progress Note   02/02/21 1500  Mobility  Activity Sat and stood x 3 (x10*)  Level of Assistance Contact guard assist, steadying assist  Assistive Device Four wheel walker  Mobility Sit up in bed/chair position for meals  Mobility Response Tolerated well  Mobility performed by Mobility specialist  $Mobility charge 1 Mobility   Received pt in chair w/ no complaints and agreeable. X10 STS w/ x1 seated break d/t fatigue. Pt demonstrating good acknowledgment of standing posture. Performed LE exercises after STS's while seated, one seated rest break needed, otherwise demonstrated a good progression of strength. No complaints after compltetion, left w/ pt in chair and case manager in the room.    Holland Falling Mobility Specialist Phone Number [phone]

## 2021-02-02 NOTE — TOC Transition Note (Signed)
Transition of Care [facility]) - CM/SW Discharge Note   Patient Details  Name: [name] MRN: [id number] Date of Birth: Oct 27, 1930  Transition of [facility]) CM/SW Contact:  Bartholomew Crews, RN Phone Number: [phone] 02/02/2021, 3:19 PM   Clinical Narrative:     Spoke with patient and son at the bedside to discuss transition home today. Discussed recommendations for [facility] PT. Offered agency choice. Referral accepted by [facility]. No DME needed - ambulatory sat does not meet criteria for home oxygen. Family to provide transportation home.   Final next level of care: [place] Barriers to Discharge: No Barriers Identified   Patient Goals and CMS Choice Patient states their goals for this hospitalization and ongoing recovery are:: return home CMS Medicare.gov Compare Post Acute Care list provided to:: Patient Choice offered to / list presented to : Patient  Discharge Placement                       Discharge Plan and Services   Discharge Planning Services: CM Consult            DME Arranged: N/A DME Agency: NA       HH Arranged: PT HH Agency: [place] Date [place]: 02/02/21 Time [place]: 1519 Representative spoke with at [place]: Amy  Social Determinants of Health ([place]) Interventions     Readmission Risk Interventions No flowsheet data found.

## 2021-02-04 NOTE — Progress Notes (Signed)
Pt was seen for mobility in his room before dc today, reviewed safety on the walker and control of balance on his three wheel walker.  Reviewed also HEP for improving ROM on knees, and will recover his mobility with HHPT that is ordered.  Follow up as acute PT goals indicate.  02/02/21 1500  PT Visit Information  Last PT Received On 02/02/21  Assistance Needed +1  History of Present Illness 88 y.o. male who presents 1/3 for evaluation of bilateral knee pain.  He reports he has had knee pain for a long time but in the last few days has become acutely worse to the point that he is not able to stand up or ambulate. Ortho consulted and aspirated  and injected both knees on 1/4; with medical history significant for HTN, Gout, CKD 3, OA, CAD s/p CABG  Subjective Data  Subjective reports he is ready to go home today  Patient Stated Goal less knee pain  Precautions  Precautions Fall  Precaution Comments knee pain  Restrictions  Weight Bearing Restrictions No  Pain Assessment  Pain Assessment Faces  Faces Pain Scale 4  Pain Location stiffness in B knees  Pain Descriptors / Indicators Guarding  Pain Intervention(s) Limited activity within patient's tolerance;Monitored during session;Repositioned  Cognition  Arousal/Alertness Awake/alert  Behavior During Therapy WFL for tasks assessed/performed  Overall Cognitive Status Within Functional Limits for tasks assessed  General Comments up in chair and looking forward to home  Bed Mobility  General bed mobility comments up in chair  Transfers  Overall transfer level Needs assistance  Equipment used Rolling walker (2 wheels)  Transfers Sit to/from Stand  Sit to Stand Min assist  General transfer comment min assist with pt making good initiation, cued hand placement and safety  Ambulation/Gait  Ambulation/Gait assistance Supervision;Min guard  Gait Distance (Feet) 35 Feet  Assistive device Rolling walker (2 wheels) (three wheeler)  Gait  Pattern/deviations Step-through pattern;Trunk flexed;Decreased stride length  General Gait Details room walk with cues for safety and instructions for his safety with turns and obstacles  Gait velocity controlled  Gait velocity interpretation <1.31 ft/sec, indicative of household ambulator  Pre-gait activities short standing balance check  Balance  Overall balance assessment Needs assistance  Sitting-balance support Feet supported  Sitting balance-Leahy Scale Good  Standing balance support Bilateral upper extremity supported;During functional activity  Standing balance-Leahy Scale Fair  Standing balance comment less than fair dynamically  General Comments  General comments (skin integrity, edema, etc.) pt was instructed only briefly on stretches to B knees, cues for increasing extension and warming up ankles before standing, to do in chair or bed  Exercises  Exercises General Lower Extremity  General Exercises - Lower Extremity  Ankle Circles/Pumps AROM  Quad Sets AROM  PT - End of Session  Equipment Utilized During Treatment Gait belt  Activity Tolerance Patient tolerated treatment well  Patient left in chair;with call bell/phone within reach;with chair alarm set;with family/visitor present  Nurse Communication Mobility status   PT - Assessment/Plan  PT Plan Current plan remains appropriate  PT Visit Diagnosis Unsteadiness on feet (R26.81);Pain  Pain - part of body Knee  PT Frequency (ACUTE ONLY) Min 4X/week  Recommendations for Other Services OT consult  Follow Up Recommendations Home health PT  Assistance recommended at discharge Intermittent Supervision/Assistance  Patient can return home with the following A little help with walking and/or transfers;A little help with bathing/dressing/bathroom;Assistance with cooking/housework;Help with stairs or ramp for entrance  PT equipment None recommended by  PT  AM-PAC PT "6 Clicks" Mobility Outcome Measure (Version 2)  Help needed  turning from your back to your side while in a flat bed without using bedrails? 3  Help needed moving from lying on your back to sitting on the side of a flat bed without using bedrails? 3  Help needed moving to and from a bed to a chair (including a wheelchair)? 3  Help needed standing up from a chair using your arms (e.g., wheelchair or bedside chair)? 3  Help needed to walk in hospital room? 3  Help needed climbing 3-5 steps with a railing?  3  6 Click Score 18  Consider Recommendation of Discharge To: Home with [facility]  Progressive Mobility  Mobility Sit up in bed/chair position for meals  PT Goal Progression  Progress towards PT goals Progressing toward goals  PT Time Calculation  PT Start Time (ACUTE ONLY) 1405  PT Stop Time (ACUTE ONLY) 1428  PT Time Calculation (min) (ACUTE ONLY) 23 min  PT General Charges  $$ ACUTE PT VISIT 1 Visit  PT Treatments  $Gait Training 8-22 mins  $Therapeutic Activity 8-22 mins    Mee Hives, PT PhD Acute Rehab Dept. Number: [place] and [place]

## 2021-02-27 DIAGNOSIS — R0789 Other chest pain: Secondary | ICD-10-CM | POA: Diagnosis not present

## 2021-02-28 ENCOUNTER — Ambulatory Visit
Admission: RE | Admit: 2021-02-28 | Discharge: 2021-02-28 | Disposition: A | Discharge status: Home / Self Care | Payer: Medicare HMO | Source: Ambulatory Visit | Attending: Internal Medicine | Admitting: Internal Medicine

## 2021-02-28 ENCOUNTER — Other Ambulatory Visit: Payer: Self-pay | Admitting: Internal Medicine

## 2021-02-28 DIAGNOSIS — R0789 Other chest pain: Secondary | ICD-10-CM

## 2021-02-28 DIAGNOSIS — R634 Abnormal weight loss: Secondary | ICD-10-CM | POA: Diagnosis not present

## 2021-02-28 DIAGNOSIS — I499 Cardiac arrhythmia, unspecified: Secondary | ICD-10-CM | POA: Diagnosis not present

## 2021-02-28 DIAGNOSIS — I4891 Unspecified atrial fibrillation: Secondary | ICD-10-CM | POA: Diagnosis not present

## 2021-02-28 DIAGNOSIS — R918 Other nonspecific abnormal finding of lung field: Secondary | ICD-10-CM | POA: Diagnosis not present

## 2021-02-28 DIAGNOSIS — D510 Vitamin B12 deficiency anemia due to intrinsic factor deficiency: Secondary | ICD-10-CM | POA: Diagnosis not present

## 2021-03-01 ENCOUNTER — Emergency Department (HOSPITAL_COMMUNITY): Payer: Medicare HMO

## 2021-03-01 ENCOUNTER — Encounter (HOSPITAL_COMMUNITY): Payer: Self-pay | Admitting: *Deleted

## 2021-03-01 ENCOUNTER — Inpatient Hospital Stay (HOSPITAL_COMMUNITY)
Admission: EM | Admit: 2021-03-01 | Discharge: 2021-03-11 | DRG: 388 | Disposition: A | Discharge status: Skilled Nursing Facility | Payer: Medicare HMO | Attending: Internal Medicine | Admitting: Internal Medicine

## 2021-03-01 ENCOUNTER — Inpatient Hospital Stay (HOSPITAL_COMMUNITY): Payer: Medicare HMO

## 2021-03-01 ENCOUNTER — Other Ambulatory Visit: Payer: Self-pay

## 2021-03-01 DIAGNOSIS — Z886 Allergy status to analgesic agent status: Secondary | ICD-10-CM

## 2021-03-01 DIAGNOSIS — Z85528 Personal history of other malignant neoplasm of kidney: Secondary | ICD-10-CM

## 2021-03-01 DIAGNOSIS — J9602 Acute respiratory failure with hypercapnia: Secondary | ICD-10-CM

## 2021-03-01 DIAGNOSIS — I252 Old myocardial infarction: Secondary | ICD-10-CM | POA: Diagnosis not present

## 2021-03-01 DIAGNOSIS — K56609 Unspecified intestinal obstruction, unspecified as to partial versus complete obstruction: Secondary | ICD-10-CM

## 2021-03-01 DIAGNOSIS — Z4659 Encounter for fitting and adjustment of other gastrointestinal appliance and device: Secondary | ICD-10-CM | POA: Diagnosis not present

## 2021-03-01 DIAGNOSIS — I4891 Unspecified atrial fibrillation: Secondary | ICD-10-CM | POA: Diagnosis present

## 2021-03-01 DIAGNOSIS — R54 Age-related physical debility: Secondary | ICD-10-CM | POA: Diagnosis present

## 2021-03-01 DIAGNOSIS — D638 Anemia in other chronic diseases classified elsewhere: Secondary | ICD-10-CM | POA: Diagnosis present

## 2021-03-01 DIAGNOSIS — Z20822 Contact with and (suspected) exposure to covid-19: Secondary | ICD-10-CM | POA: Diagnosis not present

## 2021-03-01 DIAGNOSIS — K56600 Partial intestinal obstruction, unspecified as to cause: Principal | ICD-10-CM | POA: Diagnosis present

## 2021-03-01 DIAGNOSIS — N1832 Chronic kidney disease, stage 3b: Secondary | ICD-10-CM | POA: Diagnosis not present

## 2021-03-01 DIAGNOSIS — N179 Acute kidney failure, unspecified: Secondary | ICD-10-CM | POA: Diagnosis not present

## 2021-03-01 DIAGNOSIS — K219 Gastro-esophageal reflux disease without esophagitis: Secondary | ICD-10-CM | POA: Diagnosis present

## 2021-03-01 DIAGNOSIS — M109 Gout, unspecified: Secondary | ICD-10-CM | POA: Diagnosis present

## 2021-03-01 DIAGNOSIS — K5939 Other megacolon: Secondary | ICD-10-CM | POA: Diagnosis not present

## 2021-03-01 DIAGNOSIS — D72829 Elevated white blood cell count, unspecified: Secondary | ICD-10-CM

## 2021-03-01 DIAGNOSIS — Z8554 Personal history of malignant neoplasm of ureter: Secondary | ICD-10-CM

## 2021-03-01 DIAGNOSIS — I10 Essential (primary) hypertension: Secondary | ICD-10-CM | POA: Diagnosis present

## 2021-03-01 DIAGNOSIS — Z8616 Personal history of COVID-19: Secondary | ICD-10-CM

## 2021-03-01 DIAGNOSIS — I2510 Atherosclerotic heart disease of native coronary artery without angina pectoris: Secondary | ICD-10-CM | POA: Diagnosis present

## 2021-03-01 DIAGNOSIS — I130 Hypertensive heart and chronic kidney disease with heart failure and stage 1 through stage 4 chronic kidney disease, or unspecified chronic kidney disease: Secondary | ICD-10-CM | POA: Diagnosis not present

## 2021-03-01 DIAGNOSIS — R52 Pain, unspecified: Secondary | ICD-10-CM

## 2021-03-01 DIAGNOSIS — J479 Bronchiectasis, uncomplicated: Secondary | ICD-10-CM | POA: Diagnosis not present

## 2021-03-01 DIAGNOSIS — I480 Paroxysmal atrial fibrillation: Secondary | ICD-10-CM | POA: Diagnosis present

## 2021-03-01 DIAGNOSIS — Z0189 Encounter for other specified special examinations: Secondary | ICD-10-CM

## 2021-03-01 DIAGNOSIS — R0602 Shortness of breath: Secondary | ICD-10-CM | POA: Diagnosis not present

## 2021-03-01 DIAGNOSIS — R0600 Dyspnea, unspecified: Secondary | ICD-10-CM | POA: Diagnosis not present

## 2021-03-01 DIAGNOSIS — Z87891 Personal history of nicotine dependence: Secondary | ICD-10-CM

## 2021-03-01 DIAGNOSIS — Z681 Body mass index (BMI) 19 or less, adult: Secondary | ICD-10-CM | POA: Diagnosis not present

## 2021-03-01 DIAGNOSIS — J189 Pneumonia, unspecified organism: Secondary | ICD-10-CM

## 2021-03-01 DIAGNOSIS — K449 Diaphragmatic hernia without obstruction or gangrene: Secondary | ICD-10-CM | POA: Diagnosis not present

## 2021-03-01 DIAGNOSIS — I700 Atherosclerosis of aorta: Secondary | ICD-10-CM | POA: Diagnosis not present

## 2021-03-01 DIAGNOSIS — R64 Cachexia: Secondary | ICD-10-CM | POA: Diagnosis not present

## 2021-03-01 DIAGNOSIS — J9601 Acute respiratory failure with hypoxia: Secondary | ICD-10-CM | POA: Diagnosis present

## 2021-03-01 DIAGNOSIS — I5043 Acute on chronic combined systolic (congestive) and diastolic (congestive) heart failure: Secondary | ICD-10-CM | POA: Diagnosis not present

## 2021-03-01 DIAGNOSIS — E43 Unspecified severe protein-calorie malnutrition: Secondary | ICD-10-CM | POA: Diagnosis present

## 2021-03-01 DIAGNOSIS — E785 Hyperlipidemia, unspecified: Secondary | ICD-10-CM | POA: Diagnosis present

## 2021-03-01 DIAGNOSIS — Z4682 Encounter for fitting and adjustment of non-vascular catheter: Secondary | ICD-10-CM | POA: Diagnosis not present

## 2021-03-01 DIAGNOSIS — D631 Anemia in chronic kidney disease: Secondary | ICD-10-CM | POA: Diagnosis not present

## 2021-03-01 DIAGNOSIS — R079 Chest pain, unspecified: Secondary | ICD-10-CM | POA: Diagnosis not present

## 2021-03-01 DIAGNOSIS — J90 Pleural effusion, not elsewhere classified: Secondary | ICD-10-CM

## 2021-03-01 DIAGNOSIS — I25810 Atherosclerosis of coronary artery bypass graft(s) without angina pectoris: Secondary | ICD-10-CM | POA: Diagnosis present

## 2021-03-01 DIAGNOSIS — R109 Unspecified abdominal pain: Secondary | ICD-10-CM | POA: Diagnosis not present

## 2021-03-01 DIAGNOSIS — K5669 Other partial intestinal obstruction: Secondary | ICD-10-CM | POA: Diagnosis not present

## 2021-03-01 DIAGNOSIS — M25511 Pain in right shoulder: Secondary | ICD-10-CM

## 2021-03-01 DIAGNOSIS — Z888 Allergy status to other drugs, medicaments and biological substances status: Secondary | ICD-10-CM

## 2021-03-01 DIAGNOSIS — M19011 Primary osteoarthritis, right shoulder: Secondary | ICD-10-CM | POA: Diagnosis not present

## 2021-03-01 DIAGNOSIS — I517 Cardiomegaly: Secondary | ICD-10-CM | POA: Diagnosis not present

## 2021-03-01 DIAGNOSIS — Z7401 Bed confinement status: Secondary | ICD-10-CM | POA: Diagnosis not present

## 2021-03-01 DIAGNOSIS — I5042 Chronic combined systolic (congestive) and diastolic (congestive) heart failure: Secondary | ICD-10-CM | POA: Insufficient documentation

## 2021-03-01 DIAGNOSIS — I959 Hypotension, unspecified: Secondary | ICD-10-CM | POA: Diagnosis not present

## 2021-03-01 DIAGNOSIS — K6389 Other specified diseases of intestine: Secondary | ICD-10-CM | POA: Diagnosis not present

## 2021-03-01 DIAGNOSIS — J9691 Respiratory failure, unspecified with hypoxia: Secondary | ICD-10-CM | POA: Diagnosis not present

## 2021-03-01 LAB — CBC WITH DIFFERENTIAL/PLATELET
Abs Immature Granulocytes: 0.1 10*3/uL — ABNORMAL HIGH (ref 0.00–0.07)
Basophils Absolute: 0.1 10*3/uL (ref 0.0–0.1)
Basophils Relative: 1 %
Eosinophils Absolute: 0.1 10*3/uL (ref 0.0–0.5)
Eosinophils Relative: 1 %
HCT: 35.4 % — ABNORMAL LOW (ref 39.0–52.0)
Hemoglobin: 11.3 g/dL — ABNORMAL LOW (ref 13.0–17.0)
Immature Granulocytes: 1 %
Lymphocytes Relative: 6 %
Lymphs Abs: 1 10*3/uL (ref 0.7–4.0)
MCH: 28.3 pg (ref 26.0–34.0)
MCHC: 31.9 g/dL (ref 30.0–36.0)
MCV: 88.5 fL (ref 80.0–100.0)
Monocytes Absolute: 1.1 10*3/uL — ABNORMAL HIGH (ref 0.1–1.0)
Monocytes Relative: 7 %
Neutro Abs: 13.1 10*3/uL — ABNORMAL HIGH (ref 1.7–7.7)
Neutrophils Relative %: 84 %
Platelets: 686 10*3/uL — ABNORMAL HIGH (ref 150–400)
RBC: 4 MIL/uL — ABNORMAL LOW (ref 4.22–5.81)
RDW: 17.1 % — ABNORMAL HIGH (ref 11.5–15.5)
WBC: 15.5 10*3/uL — ABNORMAL HIGH (ref 4.0–10.5)
nRBC: 0 % (ref 0.0–0.2)

## 2021-03-01 LAB — MAGNESIUM: Magnesium: 1.6 mg/dL — ABNORMAL LOW (ref 1.7–2.4)

## 2021-03-01 LAB — COMPREHENSIVE METABOLIC PANEL
ALT: 6 U/L (ref 0–44)
AST: 20 U/L (ref 15–41)
Albumin: 3.1 g/dL — ABNORMAL LOW (ref 3.5–5.0)
Alkaline Phosphatase: 71 U/L (ref 38–126)
Anion gap: 12 (ref 5–15)
BUN: 31 mg/dL — ABNORMAL HIGH (ref 8–23)
CO2: 24 mmol/L (ref 22–32)
Calcium: 8.7 mg/dL — ABNORMAL LOW (ref 8.9–10.3)
Chloride: 97 mmol/L — ABNORMAL LOW (ref 98–111)
Creatinine, Ser: 1.51 mg/dL — ABNORMAL HIGH (ref 0.61–1.24)
GFR, Estimated: 44 mL/min — ABNORMAL LOW (ref >60)
Glucose, Bld: 118 mg/dL — ABNORMAL HIGH (ref 70–99)
Potassium: 3.8 mmol/L (ref 3.5–5.1)
Sodium: 133 mmol/L — ABNORMAL LOW (ref 135–145)
Total Bilirubin: 0.8 mg/dL (ref 0.3–1.2)
Total Protein: 6.6 g/dL (ref 6.5–8.1)

## 2021-03-01 LAB — RESPIRATORY PANEL BY PCR

## 2021-03-01 LAB — LACTIC ACID, PLASMA
Lactic Acid, Venous: 2 mmol/L (ref 0.5–1.9)
Lactic Acid, Venous: 1.7 mmol/L (ref 0.5–1.9)

## 2021-03-01 LAB — LIPASE, BLOOD: Lipase: 24 U/L (ref 11–51)

## 2021-03-01 MED ORDER — SODIUM CHLORIDE 0.9 % IV SOLN
2.0000 g | INTRAVENOUS | Status: AC
  Administered 2021-03-02 – 2021-03-06 (×5): 2 g via INTRAVENOUS
  Filled 2021-03-01 (×5): qty 20

## 2021-03-01 MED ORDER — SODIUM CHLORIDE 0.9 % IV SOLN
1.0000 g | Freq: Once | INTRAVENOUS | Status: AC
  Administered 2021-03-01: 1 g via INTRAVENOUS
  Filled 2021-03-01: qty 10

## 2021-03-01 MED ORDER — METRONIDAZOLE 500 MG/100ML IV SOLN
500.0000 mg | Freq: Two times a day (BID) | INTRAVENOUS | Status: DC
  Administered 2021-03-01 – 2021-03-04 (×6): 500 mg via INTRAVENOUS
  Filled 2021-03-01 (×6): qty 100

## 2021-03-01 MED ORDER — HEPARIN BOLUS VIA INFUSION
3000.0000 [IU] | Freq: Once | INTRAVENOUS | Status: AC
Start: 2021-03-01 — End: 2021-03-01
  Administered 2021-03-01: 3000 [IU] via INTRAVENOUS
  Filled 2021-03-01: qty 3000

## 2021-03-01 MED ORDER — MORPHINE SULFATE (PF) 4 MG/ML IV SOLN
4.0000 mg | Freq: Once | INTRAVENOUS | Status: AC
Start: 2021-03-01 — End: 2021-03-01
  Administered 2021-03-01: 4 mg via INTRAVENOUS
  Filled 2021-03-01: qty 1

## 2021-03-01 MED ORDER — POTASSIUM CHLORIDE 10 MEQ/100ML IV SOLN
10.0000 meq | INTRAVENOUS | Status: AC
  Administered 2021-03-01 (×2): 10 meq via INTRAVENOUS
  Filled 2021-03-01 (×2): qty 100

## 2021-03-01 MED ORDER — ONDANSETRON HCL 4 MG/2ML IJ SOLN
4.0000 mg | Freq: Four times a day (QID) | INTRAMUSCULAR | Status: DC | PRN
  Administered 2021-03-02: 4 mg via INTRAVENOUS
  Filled 2021-03-01: qty 2

## 2021-03-01 MED ORDER — DILTIAZEM LOAD VIA INFUSION
10.0000 mg | Freq: Once | INTRAVENOUS | Status: AC
  Administered 2021-03-01: 10 mg via INTRAVENOUS
  Filled 2021-03-01: qty 10

## 2021-03-01 MED ORDER — MAGNESIUM SULFATE 2 GM/50ML IV SOLN
2.0000 g | Freq: Once | INTRAVENOUS | Status: AC
  Administered 2021-03-01: 2 g via INTRAVENOUS
  Filled 2021-03-01: qty 50

## 2021-03-01 MED ORDER — ONDANSETRON HCL 4 MG/2ML IJ SOLN
4.0000 mg | Freq: Once | INTRAMUSCULAR | Status: AC
  Administered 2021-03-01: 4 mg via INTRAVENOUS
  Filled 2021-03-01: qty 2

## 2021-03-01 MED ORDER — LACTATED RINGERS IV BOLUS
500.0000 mL | Freq: Once | INTRAVENOUS | Status: AC
  Administered 2021-03-01: 500 mL via INTRAVENOUS

## 2021-03-01 MED ORDER — LACTATED RINGERS IV SOLN: INTRAVENOUS | Status: DC

## 2021-03-01 MED ORDER — DIATRIZOATE MEGLUMINE & SODIUM 66-10 % PO SOLN
90.0000 mL | Freq: Once | ORAL | Status: DC
  Filled 2021-03-01: qty 90

## 2021-03-01 MED ORDER — HEPARIN SODIUM (PORCINE) 5000 UNIT/ML IJ SOLN: 5000.0000 [IU] | Freq: Three times a day (TID) | INTRAMUSCULAR | Status: DC

## 2021-03-01 MED ORDER — SODIUM CHLORIDE 0.9 % IV SOLN
500.0000 mg | INTRAVENOUS | Status: AC
  Administered 2021-03-01 – 2021-03-05 (×5): 500 mg via INTRAVENOUS
  Filled 2021-03-01 (×5): qty 5

## 2021-03-01 MED ORDER — DILTIAZEM HCL-DEXTROSE 125-5 MG/125ML-% IV SOLN (PREMIX)
5.0000 mg/h | INTRAVENOUS | Status: DC
  Administered 2021-03-01 – 2021-03-04 (×3): 5 mg/h via INTRAVENOUS
  Filled 2021-03-01 (×3): qty 125

## 2021-03-01 MED ORDER — LORAZEPAM 2 MG/ML IJ SOLN: 0.5000 mg | Freq: Every evening | INTRAMUSCULAR | Status: DC | PRN

## 2021-03-01 MED ORDER — MORPHINE SULFATE (PF) 2 MG/ML IV SOLN
2.0000 mg | INTRAVENOUS | Status: DC | PRN
  Administered 2021-03-02 – 2021-03-03 (×4): 2 mg via INTRAVENOUS
  Filled 2021-03-01 (×4): qty 1

## 2021-03-01 MED ORDER — HEPARIN (PORCINE) 25000 UT/250ML-% IV SOLN
1450.0000 [IU]/h | INTRAVENOUS | Status: DC
  Administered 2021-03-01: 23:00:00 800 [IU]/h via INTRAVENOUS
  Administered 2021-03-02: 20:00:00 1000 [IU]/h via INTRAVENOUS
  Administered 2021-03-03: 1450 [IU]/h via INTRAVENOUS
  Filled 2021-03-01 (×3): qty 250

## 2021-03-01 NOTE — ED Notes (Addendum)
Attempted to place NG tube. Xray at bedside. Tube placement incorrect. NG tube removed. Pt requesting that NG tube not be placed right now. Pt states "I can't take it. It's too painful right now."

## 2021-03-01 NOTE — Assessment & Plan Note (Addendum)
-  Started on antibiotics with ceftriaxone and azithromycin, continue for a total of 5 days.  Respiratory status is stable

## 2021-03-01 NOTE — Progress Notes (Signed)
EKG shows rapid afib.  Will start IV cardizem gtts.

## 2021-03-01 NOTE — ED Triage Notes (Signed)
The pt is c/o abd pain  and constipation for 3-4 days  he had 2 bms this am

## 2021-03-01 NOTE — H&P (Signed)
History and Physical    Angel Cooper EHU:[id number] DOB: 05/14/1930 DOA: 03/01/2021  DOS: the patient was seen and examined on 03/01/2021  PCP: [name], MD   Patient coming from: Home  I have personally briefly reviewed patient's old medical records in [place]  CC: abd pain HPI: 88 year old male with a history of chronic kidney disease stage III status post kidney section due to renal cell carcinoma, hypertension presents to the ER today with abdominal pain x3 to 4 days.  Recently diagnosed with pneumonia by his PCP yesterday.  Patient was in the hospital in the early part of January due to knee pain and gout.  Son states the patient probably contracted COVID while he was here.  Patient was prescribed outpatient COVID medications but he never took them.  Patient has been having abdominal pain for several days now.  Give himself an enema today but his abdominal pain continued.  Presented to ER today for pain.  Denies any melena or rectal bleeding.  Denies any nausea or Vomiting.  CT of abdomen demonstrates small bowel obstruction with a transition point difficult due to lack of oral and IV contrast.  There is an edematous looking small loop of bowel in the left lower quadrant.  There are small volume ascites.  Chest x-ray demonstrates left lower lobe pneumonia.  General surgery has been consulted.  Hospitalist contacted for admission.   ED Course: CT abd showed SBO. CXR showed LLL pneumonia. General surgery contacted.  Review of Systems:  Review of Systems  Constitutional:  Positive for weight loss.       Admits to 20 lbs weight loss in 3 months  HENT: Negative.    Eyes: Negative.   Respiratory:  Positive for shortness of breath.        Chronic dyspnea on exertion  Cardiovascular:  Negative for chest pain and palpitations.  Gastrointestinal:  Positive for abdominal pain and constipation. Negative for nausea and vomiting.  Genitourinary: Negative.   Musculoskeletal:  Negative.   Skin: Negative.   Neurological: Negative.   Endo/Heme/Allergies: Negative.   Psychiatric/Behavioral: Negative.    All other systems reviewed and are negative.  Past Medical History:  Diagnosis Date   Atrial fibrillation ([place])    2015  W/ RVR   Bladder tumor    CHF (congestive heart failure) ([place]) 2015   CKD (chronic kidney disease), stage III (HCC)    Coronary artery disease    NO LONGER SEES CARDIOLOGIST - LAST SAW DR. 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[facility]) urologist-  dr herrick/  oncologist- dr [name]   dx 2014  s/p  right nephroureterectomy 01-24-2013 (T3, N0) invasive high grade urothelial carcinoma--     Past Surgical History:  Procedure Laterality Date   AMPUTATION FINGER Left 12/07/2018   Procedure: Amputation Left Long Finger;  Surgeon: Roseanne Kaufman, MD;  Location: [place];  Service: Orthopedics;  Laterality: Left;   CARDIAC CATHETERIZATION  07/05/1999   non-q MI;  3vessel CAD w/ high grade pLAD and mLAD, subtotal ramus branch,  OM1 70-80%,  ostialCFX 50-60%,  dCFx 80%,  RI 99%,  total occluded pRCA and fills last by right-to-right collaterals,  pLM  20% narrowing;  severe inferoapical hypokinesis   CATARACT EXTRACTION W/ INTRAOCULAR LENS  IMPLANT, BILATERAL     CORONARY ARTERY BYPASS GRAFT  06/ 2001  dr [name] tright   CYSTOSCOPY W/ RETROGRADES Left 01/31/2016   Procedure: CYSTOSCOPY WITH RETROGRADE PYELOGRAM;  Surgeon: [name], MD;  Location: [facility];  Service:  Urology;  Laterality: Left;   CYSTOSCOPY WITH RETROGRADE PYELOGRAM, URETEROSCOPY AND STENT PLACEMENT Right 12/03/2012   Procedure: CYSTOSCOPY WITH RIGHT RETROGRADE PYELOGRAM,RIGHT URETERAL WASHINGS, RIGHT URETERAL BIOPSY, RIGHT URETEROSCOPY AND STENT PLACEMENT;  Surgeon: [name], MD;  Location: [facility];  Service: Urology;  Laterality: Right;   CYSTOSCOPY WITH RETROGRADE PYELOGRAM, URETEROSCOPY AND STENT PLACEMENT Right 12/15/2012   Procedure: CYSTOSCOPY WITH RIGHT RETROGRADE PYELOGRAM, POSSIBLE RIGHT URETEROSCOPY AND STENT PLACEMENT AND REMOVAL OF LEFT STENT;  Surgeon: [name], MD;  Location: [facility];  Service: Urology;  Laterality: Right;   CYSTOSCOPY WITH STENT PLACEMENT Right 01/24/2013   Procedure: CYSTOSCOPY WITH STENT PLACEMENT;  Surgeon: [name], MD;  Location: [facility];  Service: Urology;  Laterality: Right;   I & D EXTREMITY Left 12/07/2018   Procedure: IRRIGATION AND DEBRIDEMENT EXTREMITY;  Surgeon: Roseanne Kaufman, MD;  Location: [place];  Service: Orthopedics;  Laterality: Left;   INCISION AND DRAINAGE ABSCESS Left 06/02/2016   Procedure: INCISION AND DRAINAGE LEFT SMALL FINGER;  Surgeon: [name], MD;  Location: [place];  Service: Orthopedics;  Laterality: Left;   ROBOT ASSITED LAPAROSCOPIC NEPHROURETERECTOMY Right 01/24/2013   Procedure: ROBOT ASSISTED LAPAROSCOPIC NEPHROURETERECTOMY WITH BILATERAL LYMPH NODE DISSECTION ;  Surgeon: [name], MD;  Location: [facility];  Service: Urology;  Laterality: Right;  right kidney   TRANSTHORACIC ECHOCARDIOGRAM  01/28/2013   mild LVH,  ef 45-50%, diffuse hypokinesis,  grade 1 diastolic dysfunction/  moderate LAE/  trivial PR/  mild TR, PASP 50-34mHg   TRANSURETHRAL RESECTION OF BLADDER TUMOR N/A 01/31/2016   Procedure: TRANSURETHRAL RESECTION OF BLADDER TUMOR (TURBT);  Surgeon: BArdis Hughs MD;  [facility]  Service: Urology;  Laterality: N/A;     reports that he  quit smoking about 63 years ago. His smoking use included cigarettes. He has never used smokeless tobacco. He reports that he does not currently use alcohol. He reports that he does not use drugs.  Allergies  Allergen Reactions   Aspirin Other (See Comments)    Stomach pain    Chlorhexidine Other (See Comments)    CAUSED SKIN IRRITATION AND BURNING SENSATION   Enalapril Other (See Comments)    Cannot take due to kidney compromise   Gabapentin Other (See Comments)    Caused nightmares   Statins Other (See Comments)    Myalgia    No family history on file.  Prior to Admission medications   Medication Sig Start Date End Date Taking? Authorizing Provider  acetaminophen (TYLENOL) 500 MG tablet Take 1,000 mg by mouth every 6 (six) hours as needed for mild pain or headache.    Yes [provider]  allopurinol (ZYLOPRIM) 100 MG tablet Take 0.5 tablets (50 mg total) by mouth daily. 02/01/21  Yes GDwyane Dee MD  ALPRAZolam (Duanne Moron 0.25 MG tablet Take 0.25  mg by mouth at bedtime as needed for sleep.  11/19/18  Yes [provider]  amLODipine (NORVASC) 10 MG tablet Take 10 mg by mouth daily. 02/21/21  Yes [provider]  predniSONE (DELTASONE) 20 MG tablet Take 20 mg by mouth daily with breakfast.   Yes [provider]  amoxicillin (AMOXIL) 500 MG capsule Take 500 mg by mouth 2 (two) times daily. 02/28/21   [provider]  Cyanocobalamin (B-12 COMPLIANCE INJECTION) 1000 MCG/ML KIT Inject 1,000 mcg as directed every 30 (thirty) days.    [provider]  cyproheptadine (PERIACTIN) 4 MG tablet Take 4 mg by mouth at bedtime as needed. 09/25/20   [provider]  doxycycline (VIBRA-TABS) 100 MG tablet Take 100 mg by mouth 2 (two) times daily. 02/28/21   [provider]  furosemide (LASIX) 40 MG tablet Take 80 mg by mouth every morning.     [provider]  HYDROcodone-acetaminophen (NORCO/VICODIN) 5-325 MG tablet Take 1  tablet by mouth every 6 (six) hours as needed for severe pain. 02/01/21   [name], MD  omeprazole (PRILOSEC) 20 MG capsule Take 20 mg by mouth every morning.  11/16/18   [provider]  psyllium (METAMUCIL) 58.6 % powder Take 1 packet by mouth daily as needed (constipation).    [provider]  traMADol (ULTRAM) 50 MG tablet Take 50 mg by mouth every 6 (six) hours as needed. 02/21/21   [provider]  triamcinolone cream (KENALOG) 0.1 % Apply 1 application topically See admin instructions. Apply to affected area(s) up to 2 times a day as needed for itching and AVOID UNDERARMS, FACE, and GROIN AREA 08/19/18   [provider]    Physical Exam: Vitals:   03/01/21 1650 03/01/21 1657  BP: (!) 105/92   Pulse: 69   Resp: 18   Temp: (!) 97.5 F (36.4 C)   TempSrc: Oral   SpO2: 97%   Weight:  52.2 kg  Height:  5' 4"  (1.626 m)    Physical Exam Vitals and nursing note reviewed.  Constitutional:      General: He is not in acute distress.    Appearance: He is not ill-appearing, toxic-appearing or diaphoretic.     Comments: Elderly white male.  HENT:     Head: Normocephalic and atraumatic.     Nose: Nose normal. No rhinorrhea.  Eyes:     General:        Right eye: Discharge present.        Left eye: Discharge present. Cardiovascular:     Rate and Rhythm: Tachycardia present. Rhythm irregular.     Heart sounds: Murmur heard.  Pulmonary:     Effort: No respiratory distress.     Breath sounds: No wheezing.  Abdominal:     General: Bowel sounds are decreased. There is no distension.     Tenderness: There is abdominal tenderness. There is no guarding or rebound.    Musculoskeletal:     Right lower leg: No edema.     Left lower leg: No edema.  Skin:    General: Skin is warm and dry.     Capillary Refill: Capillary refill takes less than 2 seconds.  Neurological:     General: No focal deficit present.     Mental Status: He is alert and oriented  to person, place, and time.     Labs on Admission: I have personally reviewed following labs and imaging studies  CBC: Recent Labs  Lab 03/01/21  1753  WBC 15.5*  NEUTROABS 13.1*  HGB 11.3*  HCT 35.4*  MCV 88.5  PLT 546*   Basic Metabolic Panel: Recent Labs  Lab 03/01/21 1753  NA 133*  K 3.8  CL 97*  CO2 24  GLUCOSE 118*  BUN 31*  CREATININE 1.51*  CALCIUM 8.7*  MG 1.6*   GFR: Estimated Creatinine Clearance: 24.5 mL/min (A) (by C-G formula based on SCr of 1.51 mg/dL (H)). Liver Function Tests: Recent Labs  Lab 03/01/21 1753  AST 20  ALT 6  ALKPHOS 71  BILITOT 0.8  PROT 6.6  ALBUMIN 3.1*   Recent Labs  Lab 03/01/21 1753  LIPASE 24   No results for input(s): AMMONIA in the last 168 hours. Coagulation Profile: No results for input(s): INR, PROTIME in the last 168 hours. Cardiac Enzymes: No results for input(s): CKTOTAL, CKMB, CKMBINDEX, TROPONINI in the last 168 hours. BNP (last 3 results) No results for input(s): PROBNP in the last 8760 hours. HbA1C: No results for input(s): HGBA1C in the last 72 hours. CBG: No results for input(s): GLUCAP in the last 168 hours. Lipid Profile: No results for input(s): CHOL, HDL, LDLCALC, TRIG, CHOLHDL, LDLDIRECT in the last 72 hours. Thyroid Function Tests: No results for input(s): TSH, T4TOTAL, FREET4, T3FREE, THYROIDAB in the last 72 hours. Anemia Panel: No results for input(s): VITAMINB12, FOLATE, FERRITIN, TIBC, IRON, RETICCTPCT in the last 72 hours. Urine analysis:    Component Value Date/Time   COLORURINE YELLOW 01/29/2021 1150   APPEARANCEUR CLEAR 01/29/2021 1150   LABSPEC 1.015 01/29/2021 1150   PHURINE 5.5 01/29/2021 1150   GLUCOSEU NEGATIVE 01/29/2021 1150   HGBUR NEGATIVE 01/29/2021 1150   BILIRUBINUR NEGATIVE 01/29/2021 1150   KETONESUR NEGATIVE 01/29/2021 1150   PROTEINUR NEGATIVE 01/29/2021 1150   UROBILINOGEN 0.2 01/28/2013 1204   NITRITE NEGATIVE 01/29/2021 [place]  01/29/2021 1150    Radiological Exams on Admission: I have personally reviewed images CT ABDOMEN PELVIS WO CONTRAST  Result Date: 03/01/2021 CLINICAL DATA:  Abdominal pain and constipation for 3-4 days. EXAM: CT ABDOMEN AND PELVIS WITHOUT CONTRAST TECHNIQUE: Multidetector CT imaging of the abdomen and pelvis was performed following the standard protocol without IV contrast. RADIATION DOSE REDUCTION: This exam was performed according to the departmental dose-optimization program which includes automated exposure control, adjustment of the mA and/or kV according to patient size and/or use of iterative reconstruction technique. COMPARISON:  04/17/2018 FINDINGS: Lower chest: Bilateral peripheral predominant multifocal peribronchovascular ground-glass and airspace densities are noted compatible with multifocal infection. Atypical viral infection is not excluded. Small left pleural effusion. Hepatobiliary: There is no focal liver abnormality. Gallbladder unremarkable. Pancreas: Unremarkable. No pancreatic ductal dilatation or surrounding inflammatory changes. Spleen: Normal in size without focal abnormality. Adrenals/Urinary Tract: Normal adrenal glands. Status post right nephrectomy. Left kidney is unremarkable. Bladder is unremarkable. Stomach/Bowel: Moderate to large hiatal hernia is identified. Stomach appears nondistended. The proximal and mid small bowel loops are increased in caliber with multiple air-fluid levels measuring up to 3.1 cm. The distal small bowel loops are decreased in caliber. There is an edematous appearing loop of small bowel within the left lower quadrant of the abdomen which is difficult to definitively characterize due to lack of IV and enteric contrast material, but may represent the transition point. The colon appears largely decompressed. Scattered colonic diverticula are identified. Vascular/Lymphatic: Aortic atherosclerosis. No abdominopelvic adenopathy. Signs of venous congestion  noted within the lower abdominal mesentery, image 61/3. Reproductive: Prostate is unremarkable. Other: Small volume of  ascites identified within the abdomen and pelvis. No signs of pneumoperitoneum. Musculoskeletal: No acute or significant osseous findings. Chronic bilateral L5 pars defects noted with 7 mm anterolisthesis of L5 on S1. Multilevel disc space narrowing and endplate spurring identified IMPRESSION: 1. Examination is positive for small bowel obstruction. The transition point is difficult to identify larger reflecting lack of IV and oral contrast material. 2. There is an edematous appearing loop of small bowel within the left lower quadrant of the abdomen which is difficult to definitively characterize due to lack of IV and enteric contrast material. This may represent a focal segmental area of inflammatory or infectious enteritis. 3. Small volume of ascites identified within the abdomen and pelvis. 4. Bilateral peripheral predominant multifocal peribronchovascular ground-glass and airspace densities are noted compatible with multifocal infection. Atypical viral infection is not excluded. 5. Small left pleural effusion. 6. Hiatal hernia. 7. Chronic bilateral L5 pars defects with 7 mm anterolisthesis of L5 on S1. 8. Aortic Atherosclerosis (ICD10-I70.0). Electronically Signed   By: Kerby Moors M.D.   On: 03/01/2021 18:45   DG Chest 2 View  Result Date: 02/28/2021 CLINICAL DATA:  Chest tightness EXAM: CHEST - 2 VIEW COMPARISON:  02/01/2021. FINDINGS: Low lung volumes. New patchy airspace and interstitial opacities within bilateral mid lungs and the right lung base. No visible pleural effusions or pneumothorax. Cardiomediastinal silhouette is similar. Median sternotomy. Polyarticular degenerative change. IMPRESSION: 1. New patchy airspace and interstitial opacities within bilateral mid lungs and the right lung base, primarily concerning for multifocal pneumonia. An element of mild edema is possible. 2.  Given somewhat masslike appearance of the left midlung opacity, recommend followup PA and lateral chest X-ray in 3-4 weeks following trial of antibiotic therapy to ensure resolution and exclude underlying malignancy. These results will be called to the ordering clinician or representative by the Radiologist Assistant, and communication documented in the PACS or [employer]. Electronically Signed   By: Margaretha Sheffield M.D.   On: 02/28/2021 11:24   DG Chest Portable 1 View  Result Date: 03/01/2021 CLINICAL DATA:  Pneumonia EXAM: PORTABLE CHEST 1 VIEW COMPARISON:  Previous studies including the examination of 02/28/2021 FINDINGS: Transverse diameter of heart is increased. There is possible hiatal hernia in the retrocardiac region. There is evidence of previous coronary bypass surgery. Left hemidiaphragm is elevated. There is poor inspiration. There is patchy infiltrate in the left mid and left lower lung fields with worsening of infiltrate in the medial left lower lung fields. There are small linear densities in the right parahilar region and right lower lung fields with no significant change. There is blunting of left lateral CP angle. There is no pneumothorax. IMPRESSION: There are patchy infiltrates in the left parahilar region and left lower lung fields with interval worsening suggesting worsening pneumonia. There is slight improvement in aeration of right parahilar region suggesting decrease in infiltrate. There is residual prominence of interstitial markings in the right parahilar region and right lower lung fields suggesting possible underlying scarring and superimposed interstitial pneumonia. Possible small left pleural effusion. Electronically Signed   By: Elmer Picker M.D.   On: 03/01/2021 18:38    EKG: I have personally reviewed EKG: awaiting EKG  Assessment/Plan Principal Problem:   Partial small bowel obstruction (HCC) Active Problems:   Atrial fibrillation with RVR (HCC)   CAP  (community acquired pneumonia)   Chronic kidney disease, stage 3b ([place])   Essential hypertension   Anemia of chronic disease    Assessment and Plan: * Partial small  bowel obstruction ([place])- (present on admission) Admit to progressive bed. Inpatient. General surgery consulted. Management per surgery team. Keep NPO. On IVF.  Atrial fibrillation with RVR ([place])- (present on admission) Appears to be in rapid afib on telemetry. Awaiting EKG. May need IV cardizem gtts. Check echo. Replete K and Mg.  CAP (community acquired pneumonia) On IV rocephin and zithromax. Son states pt tested positive for Covid on 02-08-2021. Will check respiratory viral panel.  Anemia of chronic disease- (present on admission) Chronic.  Essential hypertension- (present on admission) Hold BP meds. Pt is NPO. May need IV cardizem gtts if he has rapid afib. If not, may need clonidine patch vs IV HTN meds.  Chronic kidney disease, stage 3b ([place])- (present on admission) Continue IVF. No nephrotoxic agents.   DVT prophylaxis: SQ Heparin Code Status: Full Code Family Communication: discussed with pt and son Angel Cooper at bedside  Disposition Plan: return home  Consults called: general surgery  Admission status: Inpatient,  progressive   Kristopher Oppenheim, DO [facility] 03/01/2021, 8:35 PM

## 2021-03-01 NOTE — Progress Notes (Signed)
ANTICOAGULATION CONSULT NOTE - Initial Consult  Pharmacy Consult for heparin Indication: atrial fibrillation  Allergies  Allergen Reactions   Aspirin Other (See Comments)    Stomach pain    Chlorhexidine Other (See Comments)    CAUSED SKIN IRRITATION AND BURNING SENSATION   Enalapril Other (See Comments)    Cannot take due to kidney compromise   Gabapentin Other (See Comments)    Caused nightmares   Statins Other (See Comments)    Myalgia    Patient Measurements: Height: 5\' 4"  (162.6 cm) Weight: 52.2 kg (115 lb 1.3 oz) IBW/kg (Calculated) : 59.2 Heparin Dosing Weight: 52.2 kg   Vital Signs: Temp: 97.5 F (36.4 C) (02/03 1650) Temp Source: Oral (02/03 1650) BP: 105/92 (02/03 1650) Pulse Rate: 69 (02/03 1650)  Labs: Recent Labs    03/01/21 1753  HGB 11.3*  HCT 35.4*  PLT 686*  CREATININE 1.51*    Estimated Creatinine Clearance: 24.5 mL/min (A) (by C-G formula based on SCr of 1.51 mg/dL (H)).   Medical History: Past Medical History:  Diagnosis Date   Atrial fibrillation ([place])    2015  W/ RVR   Bladder tumor    CHF (congestive heart failure) ([place]) 2015   CKD (chronic kidney disease), stage III (HCC)    Coronary artery disease    NO LONGER SEES CARDIOLOGIST - LAST SAW DR. [name] 2 OR MORE YRS AGO-PT SEES PCP DR [name]   GERD (gastroesophageal reflux disease)    Hiatal hernia    History of colon polyps    History of gastric ulcer    1989  peptic and duodenal   History of MI (myocardial infarction)    1995   History of non-ST elevation myocardial infarction (NSTEMI)    07-05-1999  s/p cabg   Hyperlipidemia    Hypertension    OA (osteoarthritis)    Osteomyelitis ([place]) 12/07/2018   S/P CABG (coronary artery bypass graft)    06/ 2001   Ureteral carcinoma, right [facility]) urologist-  dr herrick/  oncologist- dr [name]   dx 2014  s/p  right nephroureterectomy 01-24-2013 (T3, N0) invasive high grade urothelial carcinoma--     Medications:  (Not in a  hospital admission)   Assessment: 49 YOM with h/o Afib to start IV heparin. H/H low. Plt elevated. SCr down to 1.51   Goal of Therapy:  Heparin level 0.3-0.7 units/ml Monitor platelets by anticoagulation protocol: Yes   Plan:  -Heparin 3000 units bolus followed by heparin infusion at 800 units/hr  -F/u 8 hr HL -Monitor daily HL, CBC and s/s of bleeding   [name], PharmD., BCCCP Clinical Pharmacist Please refer to [facility] for unit-specific pharmacist

## 2021-03-01 NOTE — Assessment & Plan Note (Addendum)
-   General surgery consulted and followed patient while hospitalized.  He was treated with conservative management with NG tube, IV fluids, n.p.o.  He improved, NG tube was discontinued 2/6 and his diet is slowly advanced.  He is doing better this morning

## 2021-03-01 NOTE — ED Provider Notes (Signed)
[facility] Emergency Department Provider Note MRN:  [id number]  Arrival date & time: 03/01/21     Chief Complaint   Abdominal Pain   History of Present Illness   Angel Cooper is a 88 y.o. year-old male with a history of atrial fibrillation, CKD, CHF, nephroureterectomy presenting to the ED with chief complaint of abdominal pain.  Patient reports that he has had abdominal pain for the previous 1 to 2 days.  During this time he initially was very constipated so he gave himself an enema and he had a bowel movement however this did not improve his abdominal pain.  Patient states that he has also been nauseous and has had difficulty tolerating adequate p.o. intake during this time.  Patient states that his abdominal pain is located in his right lower and left lower quadrants.  It is nonradiating.  It feels as if he has pressure and cramping.  He has not had diaphoresis, chest pain.  Patient also admits that he has had exertional shortness of breath at home.  He states that he was recently diagnosed with a pneumonia on chest x-ray yesterday.  The patient has been recovering from COVID-19 over the past 3 weeks.  He was started on a course of outpatient antibiotics.  Review of Systems  A thorough review of systems was obtained and all systems are negative except as noted in the HPI and PMH.   Patient's Health History    Past Medical History:  Diagnosis Date   Atrial fibrillation ([place])    2015  W/ RVR   Bladder tumor    CHF (congestive heart failure) ([place]) 2015   CKD (chronic kidney disease), stage III (HCC)    Coronary artery disease    NO LONGER SEES CARDIOLOGIST - LAST SAW DR. [name] 2 OR MORE YRS AGO-PT SEES PCP DR [name]   GERD (gastroesophageal reflux disease)    Hiatal hernia    History of colon polyps    History of gastric ulcer    1989  peptic and duodenal   History of MI (myocardial infarction)    1995   History of non-ST elevation  myocardial infarction (NSTEMI)    07-05-1999  s/p cabg   Hyperlipidemia    Hypertension    OA (osteoarthritis)    Osteomyelitis ([place]) 12/07/2018   S/P CABG (coronary artery bypass graft)    06/ 2001   Ureteral carcinoma, right [facility]) urologist-  dr herrick/  oncologist- dr [name]   dx 2014  s/p  right nephroureterectomy 01-24-2013 (T3, N0) invasive high grade urothelial carcinoma--     Past Surgical History:  Procedure Laterality Date   AMPUTATION FINGER Left 12/07/2018   Procedure: Amputation Left Long Finger;  Surgeon: Roseanne Kaufman, MD;  Location: [place];  Service: Orthopedics;  Laterality: Left;   CARDIAC CATHETERIZATION  07/05/1999   non-q MI;  3vessel CAD w/ high grade pLAD and mLAD, subtotal ramus branch,  OM1 70-80%,  ostialCFX 50-60%,  dCFx 80%,  RI 99%,  total occluded pRCA and fills last by right-to-right collaterals,  pLM  20% narrowing;  severe inferoapical hypokinesis   CATARACT EXTRACTION W/ INTRAOCULAR LENS  IMPLANT, BILATERAL     CORONARY ARTERY BYPASS GRAFT  06/ 2001  dr [name] tright   CYSTOSCOPY W/ RETROGRADES Left 01/31/2016   Procedure: CYSTOSCOPY WITH RETROGRADE PYELOGRAM;  Surgeon: [name], MD;  Location[facility];  Service: Urology;  Laterality: Left;   CYSTOSCOPY WITH RETROGRADE PYELOGRAM, URETEROSCOPY  AND STENT PLACEMENT Right 12/03/2012   Procedure: CYSTOSCOPY WITH RIGHT RETROGRADE PYELOGRAM,RIGHT URETERAL WASHINGS, RIGHT URETERAL BIOPSY, RIGHT URETEROSCOPY AND STENT PLACEMENT;  Surgeon: [name], MD;  Location: [facility];  Service: Urology;  Laterality: Right;   CYSTOSCOPY WITH RETROGRADE PYELOGRAM, URETEROSCOPY AND STENT PLACEMENT Right 12/15/2012   Procedure: CYSTOSCOPY WITH RIGHT RETROGRADE PYELOGRAM, POSSIBLE RIGHT URETEROSCOPY AND STENT PLACEMENT AND REMOVAL OF LEFT STENT;  Surgeon: [name], MD;  Location: [facility];  Service: Urology;  Laterality: Right;   CYSTOSCOPY WITH STENT PLACEMENT Right 01/24/2013   Procedure:  CYSTOSCOPY WITH STENT PLACEMENT;  Surgeon: [name], MD;  Location: [facility];  Service: Urology;  Laterality: Right;   I & D EXTREMITY Left 12/07/2018   Procedure: IRRIGATION AND DEBRIDEMENT EXTREMITY;  Surgeon: Roseanne Kaufman, MD;  Location: [place];  Service: Orthopedics;  Laterality: Left;   INCISION AND DRAINAGE ABSCESS Left 06/02/2016   Procedure: INCISION AND DRAINAGE LEFT SMALL FINGER;  Surgeon: [name], MD;  Location: [place];  Service: Orthopedics;  Laterality: Left;   ROBOT ASSITED LAPAROSCOPIC NEPHROURETERECTOMY Right 01/24/2013   Procedure: ROBOT ASSISTED LAPAROSCOPIC NEPHROURETERECTOMY WITH BILATERAL LYMPH NODE DISSECTION ;  Surgeon: [name], MD;  Location: [facility];  Service: Urology;  Laterality: Right;  right kidney   TRANSTHORACIC ECHOCARDIOGRAM  01/28/2013   mild LVH,  ef 45-50%, diffuse hypokinesis,  grade 1 diastolic dysfunction/  moderate LAE/  trivial PR/  mild TR, PASP 50-45mmHg   TRANSURETHRAL RESECTION OF BLADDER TUMOR N/A 01/31/2016   Procedure: TRANSURETHRAL RESECTION OF BLADDER TUMOR (TURBT);  Surgeon: [name], MD;  Location: [facility];  Service: Urology;  Laterality: N/A;    No family history on file.  Social History   Socioeconomic History   Marital status: Married    Spouse name: Not on file   Number of children: Not on file   Years of education: Not on file   Highest education level: Not on file  Occupational History   Not on file  Tobacco Use   Smoking status: Former    Types: Cigarettes    Quit date: 01/27/1958    Years since quitting: 63.1   Smokeless tobacco: Never  Substance and Sexual Activity   Alcohol use: Not Currently    Comment: OCCASIONAL   Drug use: No   Sexual activity: Not Currently  Other Topics Concern   Not on file  Social History Narrative   Not on file   Social Determinants of Health   Financial Resource Strain: Not on file  Food Insecurity: Not on file   Transportation Needs: Not on file  Physical Activity: Not on file  Stress: Not on file  Social Connections: Not on file  Intimate Partner Violence: Not on file     Physical Exam   Physical Exam Vitals and nursing note reviewed.  Constitutional:      Appearance: Normal appearance. He is not ill-appearing or diaphoretic.  HENT:     Head: Normocephalic and atraumatic.  Cardiovascular:     Rate and Rhythm: Normal rate and regular rhythm.  Pulmonary:     Effort: Pulmonary effort is normal.     Breath sounds: Normal breath sounds.  Abdominal:     General: Abdomen is flat.     Palpations: Abdomen is soft.     Tenderness: There is abdominal tenderness in the right lower quadrant, suprapubic area and left lower quadrant. There is guarding. There is no rebound.  Hernia: No hernia is present.  Skin:    General: Skin is warm and dry.     Capillary Refill: Capillary refill takes less than 2 seconds.  Neurological:     General: No focal deficit present.     Mental Status: He is alert. He is disoriented.      Diagnostic and Interventional Summary    Labs Reviewed  COMPREHENSIVE METABOLIC PANEL - Abnormal; Notable for the following components:      Result Value   Sodium 133 (*)    Chloride 97 (*)    Glucose, Bld 118 (*)    BUN 31 (*)    Creatinine, Ser 1.51 (*)    Calcium 8.7 (*)    Albumin 3.1 (*)    GFR, Estimated 44 (*)    All other components within normal limits  CBC WITH DIFFERENTIAL/PLATELET - Abnormal; Notable for the following components:   WBC 15.5 (*)    RBC 4.00 (*)    Hemoglobin 11.3 (*)    HCT 35.4 (*)    RDW 17.1 (*)    Platelets 686 (*)    Neutro Abs 13.1 (*)    Monocytes Absolute 1.1 (*)    Abs Immature Granulocytes 0.10 (*)    All other components within normal limits  MAGNESIUM - Abnormal; Notable for the following components:   Magnesium 1.6 (*)    All other components within normal limits  LACTIC ACID, PLASMA - Abnormal; Notable for the  following components:   Lactic Acid, Venous 2.0 (*)    All other components within normal limits  CULTURE, BLOOD (ROUTINE X 2)  CULTURE, BLOOD (ROUTINE X 2)  RESPIRATORY PANEL BY PCR  LIPASE, BLOOD  LACTIC ACID, PLASMA  URINALYSIS, ROUTINE W REFLEX MICROSCOPIC  LEGIONELLA PNEUMOPHILA SEROGP 1 UR AG  STREP PNEUMONIAE URINARY ANTIGEN  MAGNESIUM  COMPREHENSIVE METABOLIC PANEL  HEPARIN LEVEL (UNFRACTIONATED)  CBC    DG Abd Portable 1V-Small Bowel Protocol-Position Verification  Final Result    DG Chest Portable 1 View  Final Result    CT ABDOMEN PELVIS WO CONTRAST  Final Result    DG Abd Portable 1V-Small Bowel Obstruction Protocol-initial, 8 hr delay    (Results Pending)    Medications  lactated ringers infusion ( Intravenous New Bag/Given 03/01/21 1930)  metroNIDAZOLE (FLAGYL) IVPB 500 mg (0 mg Intravenous Stopped 03/01/21 2131)  diatrizoate meglumine-sodium (GASTROGRAFIN) 66-10 % solution 90 mL (has no administration in time range)  cefTRIAXone (ROCEPHIN) 2 g in sodium chloride 0.9 % 100 mL IVPB (has no administration in time range)  azithromycin (ZITHROMAX) 500 mg in sodium chloride 0.9 % 250 mL IVPB (has no administration in time range)  ondansetron (ZOFRAN) injection 4 mg (has no administration in time range)  LORazepam (ATIVAN) injection 0.5 mg (has no administration in time range)  morphine (PF) 2 MG/ML injection 2 mg (has no administration in time range)  potassium chloride 10 mEq in 100 mL IVPB (has no administration in time range)  diltiazem (CARDIZEM) 1 mg/mL load via infusion 10 mg (has no administration in time range)  diltiazem (CARDIZEM) 125 mg in dextrose 5% 125 mL (1 mg/mL) infusion (has no administration in time range)  heparin bolus via infusion 3,000 Units (has no administration in time range)  heparin ADULT infusion 100 units/mL (25000 units/242mL) (has no administration in time range)  morphine (PF) 4 MG/ML injection 4 mg (4 mg Intravenous Given 03/01/21 1755)   lactated ringers bolus 500 mL (0 mLs Intravenous Stopped 03/01/21 2032)  ondansetron [facility]) injection 4 mg (4 mg Intravenous Given 03/01/21 1756)  magnesium sulfate IVPB 2 g 50 mL (0 g Intravenous Stopped 03/01/21 2026)  cefTRIAXone (ROCEPHIN) 1 g in sodium chloride 0.9 % 100 mL IVPB (0 g Intravenous Stopped 03/01/21 1957)     Procedures  /  Critical Care Procedures  ED Course and Medical Decision Making  Initial Impression and Ddx 88 year old presents to emergency department for evaluation of abdominal pain.  Differential diagnosis includes but is not limited to the following: Acute appendicitis, small bowel obstruction, cystitis, pyelonephritis, nephrolithiasis.  Also have low concern for ischemic pathology at this time given the patient's past history of atrial fibrillation.  Will obtain labs and imaging to further evaluate.  In the meantime we will also give the patient a 500 mL fluid bolus given his history of heart failure as well as 4 mg of IV morphine and 4 mg of Zofran.  Past medical/surgical history that increases complexity of ED encounter: Atrial fibrillation CKD heart failure  Interpretation of Diagnostics I personally reviewed the EKG, Chest Xray, and Cardiac Monitor and my interpretation is as follows: Noted to be in normal sinus rhythm on cardiac monitor.  Chest x-ray was reviewed and does appear to have patchy infiltrates suspicious for pneumonia.    Also obtained CT abdomen pelvis to further evaluate.  CT abdomen pelvis was obtained which that the patient had findings consistent with bowel obstruction.  This is consistent with the patient's presentation.  Further lab work revealed a lactic acidosis of 2.0.  The patient also had a leukocytosis.  This could either be from the patient's pneumonia or from the patient's bowel obstruction.\  The patient was given 4 mg of morphine, fluid bolus, Zofran, and empiric antibiotics as well as blood cultures while he was pending  admission.  Patient Reassessment and Ultimate Disposition/Management The case was discussed with both trauma surgery and the hospitalist.  Trauma surgery recommended NG tube placement and admission to medicine.  Medicine agreed to admit the patient to further manage the patient's bowel obstruction.  Patient management required discussion with the following services or consulting groups:  Hospitalist Service and General/Trauma Surgery  Complexity of Problems Addressed Acute illness or injury that poses threat of life of bodily function  Additional Data Reviewed and Analyzed Further history obtained from: Recent PCP notes as per above  Factors Impacting ED Encounter Risk Consideration of hospitalization    Final Clinical Impressions(s) / ED Diagnoses     ICD-10-CM   1. Encounter for imaging study to confirm nasogastric (NG) tube placement  Z01.89 DG Abd Portable 1V-Small Bowel Protocol-Position Verification    DG Abd Portable 1V-Small Bowel Protocol-Position Verification    2. Small bowel obstruction (HCC)  K56.609 DG Abd Portable 1V-Small Bowel Obstruction Protocol-initial, 8 hr delay    DG Abd Portable 1V-Small Bowel Obstruction Protocol-initial, 8 hr delay           Zachery Dakins, MD 03/01/21 2212    Noemi Chapel, MD 03/02/21 2124

## 2021-03-01 NOTE — ED Provider Triage Note (Signed)
Emergency Medicine Provider Triage Evaluation Note  Angel Cooper , a 88 y.o. male  was evaluated in triage.  Pt complains of abdominal pain and constipation x 3-4 days.  He gave himself an enema that helped him to have 2 bowel movements this morning.  Denies melena, bright red blood in stool, fever, chills, nausea, vomiting.   Review of Systems  Positive: As per HPI above Negative: Fever, chills, nausea, vomiting  Physical Exam  BP (!) 105/92 (BP Location: Left Arm)    Pulse 69    Temp (!) 97.5 F (36.4 C) (Oral)    Resp 18    Ht 5\' 4"  (1.626 m)    Wt 52.2 kg    SpO2 97%    BMI 19.75 kg/m  Gen:   Awake, no distress   Resp:  Normal effort  MSK:   Moves extremities without difficulty  Other:  Left lower quadrant or lower abdominal tenderness to palpation.  Medical Decision Making  Medically screening exam initiated at 5:03 PM.  Appropriate orders placed.  Angel Cooper was informed that the remainder of the evaluation will be completed by another provider, this initial triage assessment does not replace that evaluation, and the importance of remaining in the ED until their evaluation is complete.   Derrian Poli A, PA-C 03/01/21 1705

## 2021-03-01 NOTE — Subjective & Objective (Signed)
CC: abd pain HPI: 88 year old male with a history of chronic kidney disease stage III status post kidney section due to renal cell carcinoma, hypertension presents to the ER today with abdominal pain x3 to 4 days.  Recently diagnosed with pneumonia by his PCP yesterday.  Patient was in the hospital in the early part of January due to knee pain and gout.  Son states the patient probably contracted COVID while he was here.  Patient was prescribed outpatient COVID medications but he never took them.  Patient has been having abdominal pain for several days now.  Give himself an enema today but his abdominal pain continued.  Presented to ER today for pain.  Denies any melena or rectal bleeding.  Denies any nausea or Vomiting.  CT of abdomen demonstrates small bowel obstruction with a transition point difficult due to lack of oral and IV contrast.  There is an edematous looking small loop of bowel in the left lower quadrant.  There are small volume ascites.  Chest x-ray demonstrates left lower lobe pneumonia.  General surgery has been consulted.  Hospitalist contacted for admission.

## 2021-03-01 NOTE — Assessment & Plan Note (Addendum)
-  Hemoglobin stable, no bleeding

## 2021-03-01 NOTE — Consult Note (Signed)
CC: abdominal pain  Requesting provider: Dr [name] Heck  HPI: Angel Cooper is an 88 y.o. male who is here for evaluation because of worsening abdominal pain.  Patient has a history of A. fib, coronary artery disease, chronic kidney disease, CHF, moderate hiatal hernia, who was in the hospital from the third through the seventh initially with complaints of knee pain and gouty arthritis that developed acute respiratory failure with hypoxia.  There was concerns for PE but his VQ scan was negative.  His son states that patient never really recovered from that hospitalization.  He had worsening breathing issues after being released from the hospital was diagnosed with COVID on January 13 per the son.  He did not take the prescribed medication.  Patient states that he really has not had a good appetite over the past few weeks.  He states that he had a about a 20 pound weight loss.  He also complains of shortness of breath and dyspnea exertion.  He started having belly pain last evening.  He states that it was pretty bad.  It was in his central abdomen.  He had some nausea but no emesis.  No fever or chills.  He had a hydrocodone at home which she took which relieved his pain but it did not stay resolved and he had recurrence of the abdominal pain this morning so he took another hydrocodone.  He states that he did have a bowel movement this morning that was after administering an enema.  He has been having some constipation since being discharged from the hospital.  He denies any diarrhea, melena.  He states he had a chest x-ray yesterday and was told he had pneumonia.  He has had a prior robotic nephro ureterectomy in 2014.  Otherwise he denies any abdominal surgery.  He had a noncontrasted CT scan which demonstrated some dilated small bowel with a possible thickened small bowel loop in the left lower quadrant.  Past Medical History:  Diagnosis Date   Atrial fibrillation ([place])    2015  W/ RVR   Bladder  tumor    CHF (congestive heart failure) ([place]) 2015   CKD (chronic kidney disease), stage III (HCC)    Coronary artery disease    NO LONGER SEES CARDIOLOGIST - LAST SAW DR. 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[facility]) urologist-  dr herrick/  oncologist- dr [name]   dx 2014  s/p  right nephroureterectomy 01-24-2013 (T3, N0) invasive high grade urothelial carcinoma--     Past Surgical History:  Procedure Laterality Date   AMPUTATION FINGER Left 12/07/2018   Procedure: Amputation Left Long Finger;  Surgeon: Roseanne Kaufman, MD;  Location: [facility];  Service: Orthopedics;  Laterality: Left;   CARDIAC CATHETERIZATION  07/05/1999   non-q MI;  3vessel CAD w/ high grade pLAD and mLAD, subtotal ramus branch,  OM1 70-80%,  ostialCFX 50-60%,  dCFx 80%,  RI 99%,  total occluded pRCA and  fills last by right-to-right collaterals,  pLM  20% narrowing;  severe inferoapical hypokinesis   CATARACT EXTRACTION W/ INTRAOCULAR LENS  IMPLANT, BILATERAL     CORONARY ARTERY BYPASS GRAFT  06/ 2001  dr [name] tright   CYSTOSCOPY W/ RETROGRADES Left 01/31/2016   Procedure: CYSTOSCOPY WITH RETROGRADE PYELOGRAM;  Surgeon: [name], MD;  Location: [facility];  Service: Urology;  Laterality: Left;   CYSTOSCOPY WITH RETROGRADE PYELOGRAM, URETEROSCOPY AND STENT PLACEMENT Right 12/03/2012   Procedure: CYSTOSCOPY WITH RIGHT RETROGRADE PYELOGRAM,RIGHT URETERAL WASHINGS, RIGHT URETERAL BIOPSY, RIGHT URETEROSCOPY AND STENT PLACEMENT;  Surgeon: [name], MD;  Location: [facility];  Service: Urology;  Laterality: Right;   CYSTOSCOPY WITH RETROGRADE PYELOGRAM, URETEROSCOPY AND STENT PLACEMENT Right 12/15/2012   Procedure: CYSTOSCOPY WITH RIGHT RETROGRADE PYELOGRAM, POSSIBLE RIGHT URETEROSCOPY AND STENT PLACEMENT AND REMOVAL OF LEFT STENT;  Surgeon: [name], MD;  [facility];  Service: Urology;  Laterality: Right;   CYSTOSCOPY WITH STENT PLACEMENT Right 01/24/2013   Procedure: CYSTOSCOPY WITH STENT PLACEMENT;  Surgeon: [name], MD;  [facility];  Service: Urology;  Laterality: Right;   I & D EXTREMITY Left 12/07/2018   Procedure: IRRIGATION AND DEBRIDEMENT EXTREMITY;  Surgeon: Roseanne Kaufman, MD;  Location: [facility];  Service: Orthopedics;  Laterality: Left;   INCISION AND DRAINAGE ABSCESS Left 06/02/2016   Procedure: INCISION AND DRAINAGE LEFT SMALL FINGER;  Surgeon: [name], MD;  Location: [facility];  Service: Orthopedics;  Laterality: Left;   ROBOT ASSITED LAPAROSCOPIC NEPHROURETERECTOMY Right 01/24/2013   Procedure: ROBOT ASSISTED LAPAROSCOPIC NEPHROURETERECTOMY WITH BILATERAL LYMPH NODE DISSECTION ;  Surgeon: [name], MD;  Location: [facility];  Service: Urology;  Laterality: Right;  right kidney   TRANSTHORACIC ECHOCARDIOGRAM  01/28/2013   mild LVH,  ef 45-50%, diffuse hypokinesis,  grade 1 diastolic dysfunction/  moderate LAE/  trivial PR/  mild TR, PASP 50-75mmHg   TRANSURETHRAL RESECTION OF BLADDER TUMOR N/A 01/31/2016   Procedure: TRANSURETHRAL RESECTION OF BLADDER TUMOR (TURBT);  Surgeon: [name], MD;  Location: [facility];  Service: Urology;  Laterality: N/A;    No family history on file.  Social:  reports that he quit smoking about 63 years ago. His smoking use included cigarettes. He has never used smokeless tobacco. He reports that he does not currently use alcohol. He reports that he does not use drugs.  Allergies:  Allergies  Allergen Reactions   Aspirin Other (See  Comments)    Stomach pain    Chlorhexidine Other (See Comments)    CAUSED SKIN IRRITATION AND BURNING SENSATION   Enalapril Other (See Comments)    Cannot take due to kidney compromise   Gabapentin Other (See Comments)    Caused nightmares    Medications: I have reviewed the patient's current medications.   ROS - all of the below systems have been reviewed with the patient and positives are indicated with bold text General: chills, fever or night sweats Eyes: blurry vision or double vision ENT: epistaxis or sore throat Allergy/Immunology: itchy/watery eyes or nasal congestion Hematologic/Lymphatic: bleeding problems, blood clots or swollen lymph nodes Endocrine: temperature intolerance or unexpected weight changes Breast: new or changing breast lumps or nipple discharge Resp: cough, shortness of breath, or wheezing CV: chest pain or dyspnea on exertion GI: as per HPI GU: dysuria, trouble voiding, or hematuria MSK: joint pain or joint stiffness Neuro: TIA or stroke symptoms Derm: pruritus and skin lesion changes Psych: anxiety  and depression  PE Blood pressure (!) 105/92, pulse 69, temperature (!) 97.5 F (36.4 C), temperature source Oral, resp. rate 18, height 5\' 4"  (1.626 m), weight 52.2 kg, SpO2 97 %. Constitutional: NAD; conversant; no deformities; frail elderly male  Eyes: Moist conjunctiva; no lid lag; anicteric; PERRL Neck: Trachea midline; no thyromegaly Lungs: Normal respiratory effort; no tactile fremitus CV: RRR; no palpable thrills; no pitting edema; old sternal incisioin GI: Abd soft, nd, mild TTP in central abd, old trocar scars; no palpable hepatosplenomegaly MSK: Normal gait; no clubbing/cyanosis; FROM of b/l knees, no knee swelling Psychiatric: Appropriate affect; alert and oriented x3 Lymphatic: No palpable cervical or axillary lymphadenopathy Skin:no rash/lesions/jaundice  Results for orders placed or performed during the hospital encounter of 03/01/21  (from the past 48 hour(s))  Comprehensive metabolic panel     Status: Abnormal   Collection Time: 03/01/21  5:53 PM  Result Value Ref Range   Sodium 133 (L) 135 - 145 mmol/L   Potassium 3.8 3.5 - 5.1 mmol/L   Chloride 97 (L) 98 - 111 mmol/L   CO2 24 22 - 32 mmol/L   Glucose, Bld 118 (H) 70 - 99 mg/dL    Comment: Glucose reference range applies only to samples taken after fasting for at least 8 hours.   BUN 31 (H) 8 - 23 mg/dL   Creatinine, Ser 1.51 (H) 0.61 - 1.24 mg/dL   Calcium 8.7 (L) 8.9 - 10.3 mg/dL   Total Protein 6.6 6.5 - 8.1 g/dL   Albumin 3.1 (L) 3.5 - 5.0 g/dL   AST 20 15 - 41 U/L   ALT 6 0 - 44 U/L   Alkaline Phosphatase 71 38 - 126 U/L   Total Bilirubin 0.8 0.3 - 1.2 mg/dL   GFR, Estimated 44 (L) >60 mL/min    Comment: (NOTE) Calculated using the CKD-EPI Creatinine Equation (2021)    Anion gap 12 5 - 15    Comment: Performed at [facility], [place] [address] [place], [place] [address]  Lipase, blood     Status: None   Collection Time: 03/01/21  5:53 PM  Result Value Ref Range   Lipase 24 11 - 51 U/L    Comment: Performed at [facility] [address], [place], [place] [address]  CBC with Diff     Status: Abnormal   Collection Time: 03/01/21  5:53 PM  Result Value Ref Range   WBC 15.5 (H) 4.0 - 10.5 K/uL   RBC 4.00 (L) 4.22 - 5.81 MIL/uL   Hemoglobin 11.3 (L) 13.0 - 17.0 g/dL   HCT 35.4 (L) 39.0 - 52.0 %   MCV 88.5 80.0 - 100.0 fL   MCH 28.3 26.0 - 34.0 pg   MCHC 31.9 30.0 - 36.0 g/dL   RDW 17.1 (H) 11.5 - 15.5 %   Platelets 686 (H) 150 - 400 K/uL   nRBC 0.0 0.0 - 0.2 %   Neutrophils Relative % 84 %   Neutro Abs 13.1 (H) 1.7 - 7.7 K/uL   Lymphocytes Relative 6 %   Lymphs Abs 1.0 0.7 - 4.0 K/uL   Monocytes Relative 7 %   Monocytes Absolute 1.1 (H) 0.1 - 1.0 K/uL   Eosinophils Relative 1 %   Eosinophils Absolute 0.1 0.0 - 0.5 K/uL   Basophils Relative 1 %   Basophils Absolute 0.1 0.0 - 0.1 K/uL   Immature Granulocytes 1 %   Abs Immature  Granulocytes 0.10 (H) 0.00 - 0.07 K/uL  Comment: Performed at [facility], [place] [address], [place], [place] [address]  Magnesium     Status: Abnormal   Collection Time: 03/01/21  5:53 PM  Result Value Ref Range   Magnesium 1.6 (L) 1.7 - 2.4 mg/dL    Comment: Performed at [facility] [address] [place], [place] [address]  Lactic acid, plasma     Status: Abnormal   Collection Time: 03/01/21  5:53 PM  Result Value Ref Range   Lactic Acid, Venous 2.0 (HH) 0.5 - 1.9 mmol/L    Comment: CRITICAL RESULT CALLED TO, READ BACK BY AND VERIFIED WITH: B.SHELTON,RN, 03/01/2021  AT 1846 A.HUGHES Performed at [facility], [place] [address], [place], [place] [address]     CT ABDOMEN PELVIS WO CONTRAST  Result Date: 03/01/2021 CLINICAL DATA:  Abdominal pain and constipation for 3-4 days. EXAM: CT ABDOMEN AND PELVIS WITHOUT CONTRAST TECHNIQUE: Multidetector CT imaging of the abdomen and pelvis was performed following the standard protocol without IV contrast. RADIATION DOSE REDUCTION: This exam was performed according to the departmental dose-optimization program which includes automated exposure control, adjustment of the mA and/or kV according to patient size and/or use of iterative reconstruction technique. COMPARISON:  04/17/2018 FINDINGS: Lower chest: Bilateral peripheral predominant multifocal peribronchovascular ground-glass and airspace densities are noted compatible with multifocal infection. Atypical viral infection is not excluded. Small left pleural effusion. Hepatobiliary: There is no focal liver abnormality. Gallbladder unremarkable. Pancreas: Unremarkable. No pancreatic ductal dilatation or surrounding inflammatory changes. Spleen: Normal in size without focal abnormality. Adrenals/Urinary Tract: Normal adrenal glands. Status post right nephrectomy. Left kidney is unremarkable. Bladder is unremarkable. Stomach/Bowel: Moderate to large hiatal hernia is identified. Stomach  appears nondistended. The proximal and mid small bowel loops are increased in caliber with multiple air-fluid levels measuring up to 3.1 cm. The distal small bowel loops are decreased in caliber. There is an edematous appearing loop of small bowel within the left lower quadrant of the abdomen which is difficult to definitively characterize due to lack of IV and enteric contrast material, but may represent the transition point. The colon appears largely decompressed. Scattered colonic diverticula are identified. Vascular/Lymphatic: Aortic atherosclerosis. No abdominopelvic adenopathy. Signs of venous congestion noted within the lower abdominal mesentery, image 61/3. Reproductive: Prostate is unremarkable. Other: Small volume of ascites identified within the abdomen and pelvis. No signs of pneumoperitoneum. Musculoskeletal: No acute or significant osseous findings. Chronic bilateral L5 pars defects noted with 7 mm anterolisthesis of L5 on S1. Multilevel disc space narrowing and endplate spurring identified IMPRESSION: 1. Examination is positive for small bowel obstruction. The transition point is difficult to identify larger reflecting lack of IV and oral contrast material. 2. There is an edematous appearing loop of small bowel within the left lower quadrant of the abdomen which is difficult to definitively characterize due to lack of IV and enteric contrast material. This may represent a focal segmental area of inflammatory or infectious enteritis. 3. Small volume of ascites identified within the abdomen and pelvis. 4. Bilateral peripheral predominant multifocal peribronchovascular ground-glass and airspace densities are noted compatible with multifocal infection. Atypical viral infection is not excluded. 5. Small left pleural effusion. 6. Hiatal hernia. 7. Chronic bilateral L5 pars defects with 7 mm anterolisthesis of L5 on S1. 8. Aortic Atherosclerosis (ICD10-I70.0). Electronically Signed   By: Kerby Moors M.D.    On: 03/01/2021 18:45   DG Chest 2 View  Result Date: 02/28/2021 CLINICAL DATA:  Chest tightness EXAM: CHEST -  2 VIEW COMPARISON:  02/01/2021. FINDINGS: Low lung volumes. New patchy airspace and interstitial opacities within bilateral mid lungs and the right lung base. No visible pleural effusions or pneumothorax. Cardiomediastinal silhouette is similar. Median sternotomy. Polyarticular degenerative change. IMPRESSION: 1. New patchy airspace and interstitial opacities within bilateral mid lungs and the right lung base, primarily concerning for multifocal pneumonia. An element of mild edema is possible. 2. Given somewhat masslike appearance of the left midlung opacity, recommend followup PA and lateral chest X-ray in 3-4 weeks following trial of antibiotic therapy to ensure resolution and exclude underlying malignancy. These results will be called to the ordering clinician or representative by the Radiologist Assistant, and communication documented in the PACS or [employer]. Electronically Signed   By: Margaretha Sheffield M.D.   On: 02/28/2021 11:24   DG Chest Portable 1 View  Result Date: 03/01/2021 CLINICAL DATA:  Pneumonia EXAM: PORTABLE CHEST 1 VIEW COMPARISON:  Previous studies including the examination of 02/28/2021 FINDINGS: Transverse diameter of heart is increased. There is possible hiatal hernia in the retrocardiac region. There is evidence of previous coronary bypass surgery. Left hemidiaphragm is elevated. There is poor inspiration. There is patchy infiltrate in the left mid and left lower lung fields with worsening of infiltrate in the medial left lower lung fields. There are small linear densities in the right parahilar region and right lower lung fields with no significant change. There is blunting of left lateral CP angle. There is no pneumothorax. IMPRESSION: There are patchy infiltrates in the left parahilar region and left lower lung fields with interval worsening suggesting worsening  pneumonia. There is slight improvement in aeration of right parahilar region suggesting decrease in infiltrate. There is residual prominence of interstitial markings in the right parahilar region and right lower lung fields suggesting possible underlying scarring and superimposed interstitial pneumonia. Possible small left pleural effusion. Electronically Signed   By: Elmer Picker M.D.   On: 03/01/2021 18:38    Imaging: Personally reviewed  A/P: Angel Cooper is an 88 y.o. male with  pSBO Probable PNA CKD HTN H/o CHF Afib leukocytosis  Admit to medicine to inpatient status Although he has some mild lactic acidosis his abdominal exam is benign and reassuring.  He has only mild tenderness.  He has had a persistent leukocytosis for at least 1 month.  -Bowel rest, NG tube placement, -Small bowel obstruction protocol -Gentle IV fluids -Repeat labs in morning.  Discussed small bowel obstruction with patient and family member.  Discussed indications of when we would contemplate surgery.  High level of medical decision making-reviewed ED physician note, discussed with hospitalist, personally reviewed CT scan, reviewed labs from today as well as from last admission, reviewed VQ scan from last admission, reviewed hospital discharge summary from last admission, decision regarding hospitalization; acute illness with unknown prognosis    Leighton Ruff. Redmond Pulling, MD, FACS General, Bariatric, & Minimally Invasive Surgery [facility], [place]

## 2021-03-01 NOTE — Assessment & Plan Note (Addendum)
-   Patient was started on Cardizem infusion while strict n.p.o.,, once his diet was advanced 2/6 he was placed on oral Cardizem 30 every 6.  He is off IV infusion, heart rate much better controlled.  Will consolidate and switch to 60 mg Q2 and potentially on discharge could go to 120 every 24 extended release.  He was on heparin infusion while n.p.o. and converted to Eliquis.  He was not on anticoagulation prior to admission.  I discussed with patient's son over the phone, they are not sure why he is not on anticoagulation but believe he may have not "felt good" in the past but they do not remember exactly.  He has not had any falls and is otherwise active, somewhat limited by his knees.  Start Eliquis

## 2021-03-01 NOTE — Assessment & Plan Note (Addendum)
-   Currently on furosemide, diltiazem.  Stable

## 2021-03-01 NOTE — Assessment & Plan Note (Addendum)
-   Baseline creatinine around 1.5.  Currently at baseline.  Resumed home furosemide yesterday, continue.  Creatinine stable this morning

## 2021-03-02 ENCOUNTER — Other Ambulatory Visit (HOSPITAL_COMMUNITY): Payer: Medicare HMO

## 2021-03-02 ENCOUNTER — Inpatient Hospital Stay (HOSPITAL_COMMUNITY): Payer: Medicare HMO

## 2021-03-02 DIAGNOSIS — I25810 Atherosclerosis of coronary artery bypass graft(s) without angina pectoris: Secondary | ICD-10-CM

## 2021-03-02 DIAGNOSIS — D638 Anemia in other chronic diseases classified elsewhere: Secondary | ICD-10-CM | POA: Diagnosis not present

## 2021-03-02 DIAGNOSIS — Z8616 Personal history of COVID-19: Secondary | ICD-10-CM

## 2021-03-02 DIAGNOSIS — I4891 Unspecified atrial fibrillation: Secondary | ICD-10-CM | POA: Diagnosis not present

## 2021-03-02 LAB — URINALYSIS, ROUTINE W REFLEX MICROSCOPIC
Bilirubin Urine: NEGATIVE
Glucose, UA: NEGATIVE mg/dL
Hgb urine dipstick: NEGATIVE
Ketones, ur: NEGATIVE mg/dL
Leukocytes,Ua: NEGATIVE
Nitrite: NEGATIVE
Protein, ur: NEGATIVE mg/dL
Specific Gravity, Urine: 1.025 (ref 1.005–1.030)
pH: 5.5 (ref 5.0–8.0)

## 2021-03-02 LAB — MAGNESIUM: Magnesium: 2 mg/dL (ref 1.7–2.4)

## 2021-03-02 LAB — CBC
HCT: 31.8 % — ABNORMAL LOW (ref 39.0–52.0)
Hemoglobin: 10.1 g/dL — ABNORMAL LOW (ref 13.0–17.0)
MCH: 28.4 pg (ref 26.0–34.0)
MCHC: 31.8 g/dL (ref 30.0–36.0)
MCV: 89.3 fL (ref 80.0–100.0)
Platelets: 595 10*3/uL — ABNORMAL HIGH (ref 150–400)
RBC: 3.56 MIL/uL — ABNORMAL LOW (ref 4.22–5.81)
RDW: 17.1 % — ABNORMAL HIGH (ref 11.5–15.5)
WBC: 14.3 10*3/uL — ABNORMAL HIGH (ref 4.0–10.5)
nRBC: 0 % (ref 0.0–0.2)

## 2021-03-02 LAB — COMPREHENSIVE METABOLIC PANEL
ALT: 7 U/L (ref 0–44)
AST: 15 U/L (ref 15–41)
Albumin: 2.6 g/dL — ABNORMAL LOW (ref 3.5–5.0)
Alkaline Phosphatase: 61 U/L (ref 38–126)
Anion gap: 12 (ref 5–15)
BUN: 27 mg/dL — ABNORMAL HIGH (ref 8–23)
CO2: 24 mmol/L (ref 22–32)
Calcium: 8.2 mg/dL — ABNORMAL LOW (ref 8.9–10.3)
Chloride: 99 mmol/L (ref 98–111)
Creatinine, Ser: 1.38 mg/dL — ABNORMAL HIGH (ref 0.61–1.24)
GFR, Estimated: 49 mL/min — ABNORMAL LOW (ref >60)
Glucose, Bld: 111 mg/dL — ABNORMAL HIGH (ref 70–99)
Potassium: 4.1 mmol/L (ref 3.5–5.1)
Sodium: 135 mmol/L (ref 135–145)
Total Bilirubin: 0.4 mg/dL (ref 0.3–1.2)
Total Protein: 5.7 g/dL — ABNORMAL LOW (ref 6.5–8.1)

## 2021-03-02 LAB — RESP PANEL BY RT-PCR (FLU A&B, COVID) ARPGX2
Influenza A by PCR: NEGATIVE
Influenza B by PCR: NEGATIVE
SARS Coronavirus 2 by RT PCR: POSITIVE — AB

## 2021-03-02 LAB — HEPARIN LEVEL (UNFRACTIONATED)
Heparin Unfractionated: <0.1 IU/mL — ABNORMAL LOW (ref 0.30–0.70)
Heparin Unfractionated: 0.21 IU/mL — ABNORMAL LOW (ref 0.30–0.70)

## 2021-03-02 LAB — STREP PNEUMONIAE URINARY ANTIGEN: Strep Pneumo Urinary Antigen: NEGATIVE

## 2021-03-02 MED ORDER — HEPARIN BOLUS VIA INFUSION
1500.0000 [IU] | Freq: Once | INTRAVENOUS | Status: AC
  Administered 2021-03-02: 1500 [IU] via INTRAVENOUS
  Filled 2021-03-02: qty 1500

## 2021-03-02 MED ORDER — SODIUM CHLORIDE 0.9 % IV SOLN: INTRAVENOUS | Status: DC

## 2021-03-02 MED ORDER — DIATRIZOATE MEGLUMINE & SODIUM 66-10 % PO SOLN
90.0000 mL | Freq: Once | ORAL | Status: AC
  Administered 2021-03-02: 90 mL via NASOGASTRIC
  Filled 2021-03-02: qty 90

## 2021-03-02 MED ORDER — ENSURE ENLIVE PO LIQD: 237.0000 mL | Freq: Two times a day (BID) | ORAL | Status: DC

## 2021-03-02 NOTE — Plan of Care (Signed)
Admitted from ED accompanied by son and RN.NGT in place and placed on low intermittent suction. Heparin infusing at 1500 units/hr noted current order of 1000 units/hr. Rate changed accordingly and verified with charge RN.

## 2021-03-02 NOTE — Progress Notes (Signed)
PROGRESS NOTE  Angel Cooper QQV:[id number] DOB: 02/26/1930 DOA: 03/01/2021 PCP: [name], MD   LOS: 1 day   Brief Narrative / Interim history: 88 year old male with CKD 3B, paroxysmal A. fib, history of renal resection due to [place], HTN, HLD comes into the hospital with abdominal pain for the past 3 days.  Apparently he was diagnosed with COVID as an outpatient about 2 weeks ago, was prescribed medication but never took them.  He saw his PCP a few days ago, had some respiratory symptoms and a chest x-ray showed pneumonia.  He then developed abdominal discomfort and came to the hospital.  He was found to have a small bowel obstruction, A. fib with RVR as well as left lobar pneumonia.  General surgery consulted  Subjective / 24h Interval events: -Doing well this morning, abdomen feels better once the NG tube was placed.  Assessment & Plan: Partial small bowel obstruction ([place])- (present on admission) - conservative management for now, n.p.o., NG tube, IV fluids.  General surgery consulted and following.  Appreciate input.  Atrial fibrillation with RVR ([place])- (present on admission) - Patient was started on Cardizem, continue for now while strict n.p.o.  Heart rate is controlled.  He is also on heparin for anticoagulation, continue  CAP (community acquired pneumonia) -Started on antibiotics with ceftriaxone and azithromycin, continue.  Respiratory status is stable  Chronic kidney disease, stage 3b ([place])- (present on admission) - Baseline creatinine around 1.5.  Currently at baseline.  On IV fluids, closely monitor renal function  Essential hypertension- (present on admission) - Hold BP meds, blood pressure controlled, on Cardizem infusion  Anemia of chronic disease- (present on admission) -Hemoglobin stable, no bleeding, continue heparin  Personal history of other malignant neoplasm of kidney - Noted  CAD (coronary artery disease) of artery bypass graft- (present on admission) -  No chest pain    Scheduled Meds:  diatrizoate meglumine-sodium  90 mL Per NG tube Once   Continuous Infusions:  sodium chloride 50 mL/hr at 03/02/21 0941   azithromycin Stopped (03/02/21 0114)   cefTRIAXone (ROCEPHIN)  IV     diltiazem (CARDIZEM) infusion 5 mg/hr (03/02/21 0941)   heparin 800 Units/hr (03/02/21 0941)   metronidazole Stopped (03/02/21 0835)   PRN Meds:.LORazepam, morphine injection, ondansetron (ZOFRAN) IV  Diet Orders (From admission, onward)     Start     Ordered   03/01/21 2048  Diet NPO time specified  Diet effective now        03/01/21 2047            DVT prophylaxis: SCDs Start: 03/01/21 2048   Lab Results  Component Value Date   PLT 595 (H) 03/02/2021      Code Status: Full Code  Family Communication: No family at bedside  Status is: Inpatient  Remains inpatient appropriate because: Severity of illness  Level of care: Progressive  Consultants:  General surgery  Procedures:  none  Microbiology  none  Antimicrobials: Ceftriaxone / Vanc / Metronidazole    Objective: Vitals:   03/02/21 0800 03/02/21 0830 03/02/21 0900 03/02/21 1000  BP: 112/75 (!) 114/51 (!) 120/59 (!) 128/56  Pulse: 71 (!) 56 90 63  Resp: 18 18 20 17   Temp:      TempSrc:      SpO2: 92% 92% 98% 95%  Weight:      Height:        Intake/Output Summary (Last 24 hours) at 03/02/2021 1020 Last data filed at 03/02/2021 0941 Gross per 24  hour  Intake 804.89 ml  Output --  Net 804.89 ml   Wt Readings from Last 3 Encounters:  03/01/21 52.2 kg  01/29/21 52.2 kg  04/17/18 46.7 kg    Examination:  Constitutional: NAD Eyes: no scleral icterus ENMT: Mucous membranes are moist.  Neck: normal, supple Respiratory: Faint bibasilar rhonchi, no wheezing, no crackles. Normal respiratory effort.  Cardiovascular: Regular rate and rhythm, no murmurs / rubs / gallops. No LE edema.  Abdomen: non distended, no tenderness. Bowel sounds positive.  Musculoskeletal: no  clubbing / cyanosis.  Skin: no rashes Neurologic: Nonfocal   Data Reviewed: I have independently reviewed following labs and imaging studies   CBC Recent Labs  Lab 03/01/21 1753 03/02/21 0227  WBC 15.5* 14.3*  HGB 11.3* 10.1*  HCT 35.4* 31.8*  PLT 686* 595*  MCV 88.5 89.3  MCH 28.3 28.4  MCHC 31.9 31.8  RDW 17.1* 17.1*  LYMPHSABS 1.0  --   MONOABS 1.1*  --   EOSABS 0.1  --   BASOSABS 0.1  --     Recent Labs  Lab 03/01/21 1753 03/01/21 2053 03/02/21 0226  NA 133*  --  135  K 3.8  --  4.1  CL 97*  --  99  CO2 24  --  24  GLUCOSE 118*  --  111*  BUN 31*  --  27*  CREATININE 1.51*  --  1.38*  CALCIUM 8.7*  --  8.2*  AST 20  --  15  ALT 6  --  7  ALKPHOS 71  --  61  BILITOT 0.8  --  0.4  ALBUMIN 3.1*  --  2.6*  MG 1.6*  --  2.0  LATICACIDVEN 2.0* 1.7  --     ------------------------------------------------------------------------------------------------------------------ No results for input(s): CHOL, HDL, LDLCALC, TRIG, CHOLHDL, LDLDIRECT in the last 72 hours.  No results found for: HGBA1C ------------------------------------------------------------------------------------------------------------------ No results for input(s): TSH, T4TOTAL, T3FREE, THYROIDAB in the last 72 hours.  Invalid input(s): FREET3  Cardiac Enzymes No results for input(s): CKMB, TROPONINI, MYOGLOBIN in the last 168 hours.  Invalid input(s): CK ------------------------------------------------------------------------------------------------------------------ No results found for: BNP  CBG: No results for input(s): GLUCAP in the last 168 hours.  Recent Results (from the past 240 hour(s))  Respiratory (~20 pathogens) panel by PCR     Status: None   Collection Time: 03/01/21  8:11 PM   Specimen: Nasopharyngeal Swab; Respiratory  Result Value Ref Range Status   Adenovirus NOT DETECTED NOT DETECTED Final   Coronavirus 229E NOT DETECTED NOT DETECTED Final    Comment: (NOTE) The  Coronavirus on the Respiratory Panel, DOES NOT test for the novel  Coronavirus (2019 nCoV)    Coronavirus HKU1 NOT DETECTED NOT DETECTED Final   Coronavirus NL63 NOT DETECTED NOT DETECTED Final   Coronavirus OC43 NOT DETECTED NOT DETECTED Final   Metapneumovirus NOT DETECTED NOT DETECTED Final   Rhinovirus / Enterovirus NOT DETECTED NOT DETECTED Final   Influenza A NOT DETECTED NOT DETECTED Final   Influenza B NOT DETECTED NOT DETECTED Final   Parainfluenza Virus 1 NOT DETECTED NOT DETECTED Final   Parainfluenza Virus 2 NOT DETECTED NOT DETECTED Final   Parainfluenza Virus 3 NOT DETECTED NOT DETECTED Final   Parainfluenza Virus 4 NOT DETECTED NOT DETECTED Final   Respiratory Syncytial Virus NOT DETECTED NOT DETECTED Final   Bordetella pertussis NOT DETECTED NOT DETECTED Final   Bordetella Parapertussis NOT DETECTED NOT DETECTED Final   Chlamydophila pneumoniae NOT DETECTED NOT DETECTED Final  Mycoplasma pneumoniae NOT DETECTED NOT DETECTED Final    Comment: Performed at [facility], [place] [address] [place], [place] [address]  Resp Panel by RT-PCR (Flu A&B, Covid) Nasopharyngeal Swab     Status: Abnormal   Collection Time: 03/02/21  3:12 AM   Specimen: Nasopharyngeal Swab; Nasopharyngeal(NP) swabs in vial transport medium  Result Value Ref Range Status   SARS Coronavirus 2 by RT PCR POSITIVE (A) NEGATIVE Final    Comment: (NOTE) SARS-CoV-2 target nucleic acids are DETECTED.  The SARS-CoV-2 RNA is generally detectable in upper respiratory specimens during the acute phase of infection. Positive results are indicative of the presence of the identified virus, but do not rule out bacterial infection or co-infection with other pathogens not detected by the test. Clinical correlation with patient history and other diagnostic information is necessary to determine patient infection status. The expected result is Negative.  Fact Sheet for  Patients: EntrepreneurPulse.com.au  Fact Sheet for Healthcare Providers: IncredibleEmployment.be  This test is not yet approved or cleared by the [place] FDA and  has been authorized for detection and/or diagnosis of SARS-CoV-2 by FDA under an Emergency Use Authorization (EUA).  This EUA will remain in effect (meaning this test can be used) for the duration of  the COVID-19 declaration under Section 564(b)(1) of the A ct, 21 U.S.C. section 360bbb-3(b)(1), unless the authorization is terminated or revoked sooner.     Influenza A by PCR NEGATIVE NEGATIVE Final   Influenza B by PCR NEGATIVE NEGATIVE Final    Comment: (NOTE) The Xpert Xpress SARS-CoV-2/FLU/RSV plus assay is intended as an aid in the diagnosis of influenza from Nasopharyngeal swab specimens and should not be used as a sole basis for treatment. Nasal washings and aspirates are unacceptable for Xpert Xpress SARS-CoV-2/FLU/RSV testing.  Fact Sheet for Patients: EntrepreneurPulse.com.au  Fact Sheet for Healthcare Providers: IncredibleEmployment.be  This test is not yet approved or cleared by the [place] FDA and has been authorized for detection and/or diagnosis of SARS-CoV-2 by FDA under an Emergency Use Authorization (EUA). This EUA will remain in effect (meaning this test can be used) for the duration of the COVID-19 declaration under Section 564(b)(1) of the Act, 21 U.S.C. section 360bbb-3(b)(1), unless the authorization is terminated or revoked.  Performed at [facility], [place] [address], [place], [place] [address]      Radiology Studies: CT ABDOMEN PELVIS WO CONTRAST  Result Date: 03/01/2021 CLINICAL DATA:  Abdominal pain and constipation for 3-4 days. EXAM: CT ABDOMEN AND PELVIS WITHOUT CONTRAST TECHNIQUE: Multidetector CT imaging of the abdomen and pelvis was performed following the standard protocol without IV contrast.  RADIATION DOSE REDUCTION: This exam was performed according to the departmental dose-optimization program which includes automated exposure control, adjustment of the mA and/or kV according to patient size and/or use of iterative reconstruction technique. COMPARISON:  04/17/2018 FINDINGS: Lower chest: Bilateral peripheral predominant multifocal peribronchovascular ground-glass and airspace densities are noted compatible with multifocal infection. Atypical viral infection is not excluded. Small left pleural effusion. Hepatobiliary: There is no focal liver abnormality. Gallbladder unremarkable. Pancreas: Unremarkable. No pancreatic ductal dilatation or surrounding inflammatory changes. Spleen: Normal in size without focal abnormality. Adrenals/Urinary Tract: Normal adrenal glands. Status post right nephrectomy. Left kidney is unremarkable. Bladder is unremarkable. Stomach/Bowel: Moderate to large hiatal hernia is identified. Stomach appears nondistended. The proximal and mid small bowel loops are increased in caliber with multiple air-fluid levels measuring up to 3.1 cm. The distal small bowel loops are decreased  in caliber. There is an edematous appearing loop of small bowel within the left lower quadrant of the abdomen which is difficult to definitively characterize due to lack of IV and enteric contrast material, but may represent the transition point. The colon appears largely decompressed. Scattered colonic diverticula are identified. Vascular/Lymphatic: Aortic atherosclerosis. No abdominopelvic adenopathy. Signs of venous congestion noted within the lower abdominal mesentery, image 61/3. Reproductive: Prostate is unremarkable. Other: Small volume of ascites identified within the abdomen and pelvis. No signs of pneumoperitoneum. Musculoskeletal: No acute or significant osseous findings. Chronic bilateral L5 pars defects noted with 7 mm anterolisthesis of L5 on S1. Multilevel disc space narrowing and endplate  spurring identified IMPRESSION: 1. Examination is positive for small bowel obstruction. The transition point is difficult to identify larger reflecting lack of IV and oral contrast material. 2. There is an edematous appearing loop of small bowel within the left lower quadrant of the abdomen which is difficult to definitively characterize due to lack of IV and enteric contrast material. This may represent a focal segmental area of inflammatory or infectious enteritis. 3. Small volume of ascites identified within the abdomen and pelvis. 4. Bilateral peripheral predominant multifocal peribronchovascular ground-glass and airspace densities are noted compatible with multifocal infection. Atypical viral infection is not excluded. 5. Small left pleural effusion. 6. Hiatal hernia. 7. Chronic bilateral L5 pars defects with 7 mm anterolisthesis of L5 on S1. 8. Aortic Atherosclerosis (ICD10-I70.0). Electronically Signed   By: Kerby Moors M.D.   On: 03/01/2021 18:45   DG Abdomen 1 View  Result Date: 03/02/2021 CLINICAL DATA:  NG tube placement. EXAM: ABDOMEN - 1 VIEW COMPARISON:  03/01/2021 FINDINGS: Low volume film with cardiomegaly and patchy bilateral airspace disease. NG tube tip projects over the lower right mediastinum. The tube does not follow the course of the airway in tip position is compatible with the patient's known large right-sided hiatal hernia (CT 03/01/2021). Diffuse gaseous bowel distention noted right upper quadrant. IMPRESSION: 1. NG tube tip projects over the lower right mediastinum, compatible with placement in the known large right-sided hiatal hernia. 2. Patchy bilateral airspace disease. Electronically Signed   By: Misty Stanley M.D.   On: 03/02/2021 04:58   DG Chest Portable 1 View  Result Date: 03/01/2021 CLINICAL DATA:  Pneumonia EXAM: PORTABLE CHEST 1 VIEW COMPARISON:  Previous studies including the examination of 02/28/2021 FINDINGS: Transverse diameter of heart is increased. There is  possible hiatal hernia in the retrocardiac region. There is evidence of previous coronary bypass surgery. Left hemidiaphragm is elevated. There is poor inspiration. There is patchy infiltrate in the left mid and left lower lung fields with worsening of infiltrate in the medial left lower lung fields. There are small linear densities in the right parahilar region and right lower lung fields with no significant change. There is blunting of left lateral CP angle. There is no pneumothorax. IMPRESSION: There are patchy infiltrates in the left parahilar region and left lower lung fields with interval worsening suggesting worsening pneumonia. There is slight improvement in aeration of right parahilar region suggesting decrease in infiltrate. There is residual prominence of interstitial markings in the right parahilar region and right lower lung fields suggesting possible underlying scarring and superimposed interstitial pneumonia. Possible small left pleural effusion. Electronically Signed   By: Elmer Picker M.D.   On: 03/01/2021 18:38   DG Abd Portable 1V-Small Bowel Protocol-Position Verification  Result Date: 03/02/2021 CLINICAL DATA:  88 year old male NG tube advancement. Known large hiatal hernia. EXAM: PORTABLE  ABDOMEN - 1 VIEW COMPARISON:  0425 hours. FINDINGS: Portable AP upright view at 0435 hours. Enteric tube has been advanced further in the known gastric hiatal hernia. Side hole now projects near the level of the tube tip on the prior exam. Tube tip now in the midline, but still above the diaphragm. Otherwise stable chest and abdomen. IMPRESSION: Enteric tube advanced further into the known gastric hiatal hernia, remaining above the diaphragm. Electronically Signed   By: Genevie Ann M.D.   On: 03/02/2021 05:24   DG Abd Portable 1V-Small Bowel Protocol-Position Verification  Result Date: 03/01/2021 CLINICAL DATA:  NG tube placement. EXAM: PORTABLE ABDOMEN - 1 VIEW COMPARISON:  03/01/2021. FINDINGS:  Mildly distended loops of small bowel are seen in the abdomen measuring up to 3 cm. There is a moderate to large hiatal hernia to the right of midline. The NG tube terminates in the region of a hiatal hernia to the right of midline. Sternotomy wires are noted over the midline. Strandy opacities are present in the lungs. Degenerative changes are noted in the thoracolumbar spine. IMPRESSION: 1. The NG tube terminates in the region of a moderate to large hiatal hernia and repositioning is recommended. 2. Mildly distended loops of small bowel in the abdomen measuring up to 3.0 cm, possible small bowel obstruction. 3. Strandy opacities at the lung bases, possible atelectasis, edema, or infiltrate. Electronically Signed   By: Brett Fairy M.D.   On: 03/01/2021 21:24     Marzetta Board, MD, PhD [facility]  Between 7 am - 7 pm I am available, please contact me via Amion (for emergencies) or Securechat (non urgent messages)  Between 7 pm - 7 am I am not available, please contact night coverage MD/APP via Amion

## 2021-03-02 NOTE — Assessment & Plan Note (Addendum)
-   No chest pain, history of CABG in 1999

## 2021-03-02 NOTE — Progress Notes (Signed)
[facility] Progress Note     Subjective: CC:  Abd pain resolved. NG was placed overnight. There is 500 cc cloudy bilious fluid in cannister. Patient is unsure of his last flatus. Last BM was Friday after enema. Confirms history of nephroureterectomy 2014. Also reports removal of a bladder tumor.   Objective: Vital signs in last 24 hours: Temp:  [97.5 F (36.4 C)] 97.5 F (36.4 C) (02/03 1650) Pulse Rate:  [56-140] 90 (02/04 0900) Resp:  [10-23] 20 (02/04 0900) BP: (105-132)/(51-94) 120/59 (02/04 0900) SpO2:  [90 %-98 %] 98 % (02/04 0900) Weight:  [52.2 kg] 52.2 kg (02/03 1657)    Intake/Output from previous day: 02/03 0701 - 02/04 0700 In: 596.7 [IV Piggyback:596.7] Out: -  Intake/Output this shift: Total I/O In: 133.6 [I.V.:133.6] Out: -   PE: Gen:  Alert, NAD, pleasant, chronically ill appearing, cachectic Card:  Regular rate and rhythm, pedal pulses 2+ BL Pulm:  Normal effort, clear to auscultation bilaterally Abd: Soft, non-tender, non-distended, no HSM, no peritonitis  Skin: warm and dry, no rashes  Psych: A&Ox3   Lab Results:  Recent Labs    03/01/21 1753 03/02/21 0227  WBC 15.5* 14.3*  HGB 11.3* 10.1*  HCT 35.4* 31.8*  PLT 686* 595*   BMET Recent Labs    03/01/21 1753 03/02/21 0226  NA 133* 135  K 3.8 4.1  CL 97* 99  CO2 24 24  GLUCOSE 118* 111*  BUN 31* 27*  CREATININE 1.51* 1.38*  CALCIUM 8.7* 8.2*   PT/INR No results for input(s): LABPROT, INR in the last 72 hours. CMP     Component Value Date/Time   NA 135 03/02/2021 0226   NA 139 02/22/2013 1032   K 4.1 03/02/2021 0226   K 5.0 02/22/2013 1032   CL 99 03/02/2021 0226   CO2 24 03/02/2021 0226   CO2 23 02/22/2013 1032   GLUCOSE 111 (H) 03/02/2021 0226   GLUCOSE 100 02/22/2013 1032   BUN 27 (H) 03/02/2021 0226   BUN 24.7 02/22/2013 1032   CREATININE 1.38 (H) 03/02/2021 0226   CREATININE 1.5 (H) 02/22/2013 1032   CALCIUM 8.2 (L) 03/02/2021 0226   CALCIUM 9.1  02/22/2013 1032   PROT 5.7 (L) 03/02/2021 0226   PROT 6.9 02/22/2013 1032   ALBUMIN 2.6 (L) 03/02/2021 0226   ALBUMIN 3.6 02/22/2013 1032   AST 15 03/02/2021 0226   AST 11 02/22/2013 1032   ALT 7 03/02/2021 0226   ALT <6 02/22/2013 1032   ALKPHOS 61 03/02/2021 0226   ALKPHOS 75 02/22/2013 1032   BILITOT 0.4 03/02/2021 0226   BILITOT 0.36 02/22/2013 1032   GFRNONAA 49 (L) 03/02/2021 0226   GFRAA 51 (L) 12/08/2018 0508   Lipase     Component Value Date/Time   LIPASE 24 03/01/2021 1753       Studies/Results: CT ABDOMEN PELVIS WO CONTRAST  Result Date: 03/01/2021 CLINICAL DATA:  Abdominal pain and constipation for 3-4 days. EXAM: CT ABDOMEN AND PELVIS WITHOUT CONTRAST TECHNIQUE: Multidetector CT imaging of the abdomen and pelvis was performed following the standard protocol without IV contrast. RADIATION DOSE REDUCTION: This exam was performed according to the departmental dose-optimization program which includes automated exposure control, adjustment of the mA and/or kV according to patient size and/or use of iterative reconstruction technique. COMPARISON:  04/17/2018 FINDINGS: Lower chest: Bilateral peripheral predominant multifocal peribronchovascular ground-glass and airspace densities are noted compatible with multifocal infection. Atypical viral infection is not excluded. Small left pleural effusion. Hepatobiliary: There  is no focal liver abnormality. Gallbladder unremarkable. Pancreas: Unremarkable. No pancreatic ductal dilatation or surrounding inflammatory changes. Spleen: Normal in size without focal abnormality. Adrenals/Urinary Tract: Normal adrenal glands. Status post right nephrectomy. Left kidney is unremarkable. Bladder is unremarkable. Stomach/Bowel: Moderate to large hiatal hernia is identified. Stomach appears nondistended. The proximal and mid small bowel loops are increased in caliber with multiple air-fluid levels measuring up to 3.1 cm. The distal small bowel loops are  decreased in caliber. There is an edematous appearing loop of small bowel within the left lower quadrant of the abdomen which is difficult to definitively characterize due to lack of IV and enteric contrast material, but may represent the transition point. The colon appears largely decompressed. Scattered colonic diverticula are identified. Vascular/Lymphatic: Aortic atherosclerosis. No abdominopelvic adenopathy. Signs of venous congestion noted within the lower abdominal mesentery, image 61/3. Reproductive: Prostate is unremarkable. Other: Small volume of ascites identified within the abdomen and pelvis. No signs of pneumoperitoneum. Musculoskeletal: No acute or significant osseous findings. Chronic bilateral L5 pars defects noted with 7 mm anterolisthesis of L5 on S1. Multilevel disc space narrowing and endplate spurring identified IMPRESSION: 1. Examination is positive for small bowel obstruction. The transition point is difficult to identify larger reflecting lack of IV and oral contrast material. 2. There is an edematous appearing loop of small bowel within the left lower quadrant of the abdomen which is difficult to definitively characterize due to lack of IV and enteric contrast material. This may represent a focal segmental area of inflammatory or infectious enteritis. 3. Small volume of ascites identified within the abdomen and pelvis. 4. Bilateral peripheral predominant multifocal peribronchovascular ground-glass and airspace densities are noted compatible with multifocal infection. Atypical viral infection is not excluded. 5. Small left pleural effusion. 6. Hiatal hernia. 7. Chronic bilateral L5 pars defects with 7 mm anterolisthesis of L5 on S1. 8. Aortic Atherosclerosis (ICD10-I70.0). Electronically Signed   By: Kerby Moors M.D.   On: 03/01/2021 18:45   DG Chest 2 View  Result Date: 02/28/2021 CLINICAL DATA:  Chest tightness EXAM: CHEST - 2 VIEW COMPARISON:  02/01/2021. FINDINGS: Low lung volumes.  New patchy airspace and interstitial opacities within bilateral mid lungs and the right lung base. No visible pleural effusions or pneumothorax. Cardiomediastinal silhouette is similar. Median sternotomy. Polyarticular degenerative change. IMPRESSION: 1. New patchy airspace and interstitial opacities within bilateral mid lungs and the right lung base, primarily concerning for multifocal pneumonia. An element of mild edema is possible. 2. Given somewhat masslike appearance of the left midlung opacity, recommend followup PA and lateral chest X-ray in 3-4 weeks following trial of antibiotic therapy to ensure resolution and exclude underlying malignancy. These results will be called to the ordering clinician or representative by the Radiologist Assistant, and communication documented in the PACS or [employer]. Electronically Signed   By: Margaretha Sheffield M.D.   On: 02/28/2021 11:24   DG Abdomen 1 View  Result Date: 03/02/2021 CLINICAL DATA:  NG tube placement. EXAM: ABDOMEN - 1 VIEW COMPARISON:  03/01/2021 FINDINGS: Low volume film with cardiomegaly and patchy bilateral airspace disease. NG tube tip projects over the lower right mediastinum. The tube does not follow the course of the airway in tip position is compatible with the patient's known large right-sided hiatal hernia (CT 03/01/2021). Diffuse gaseous bowel distention noted right upper quadrant. IMPRESSION: 1. NG tube tip projects over the lower right mediastinum, compatible with placement in the known large right-sided hiatal hernia. 2. Patchy bilateral airspace disease. Electronically Signed  By: Misty Stanley M.D.   On: 03/02/2021 04:58   DG Chest Portable 1 View  Result Date: 03/01/2021 CLINICAL DATA:  Pneumonia EXAM: PORTABLE CHEST 1 VIEW COMPARISON:  Previous studies including the examination of 02/28/2021 FINDINGS: Transverse diameter of heart is increased. There is possible hiatal hernia in the retrocardiac region. There is evidence of  previous coronary bypass surgery. Left hemidiaphragm is elevated. There is poor inspiration. There is patchy infiltrate in the left mid and left lower lung fields with worsening of infiltrate in the medial left lower lung fields. There are small linear densities in the right parahilar region and right lower lung fields with no significant change. There is blunting of left lateral CP angle. There is no pneumothorax. IMPRESSION: There are patchy infiltrates in the left parahilar region and left lower lung fields with interval worsening suggesting worsening pneumonia. There is slight improvement in aeration of right parahilar region suggesting decrease in infiltrate. There is residual prominence of interstitial markings in the right parahilar region and right lower lung fields suggesting possible underlying scarring and superimposed interstitial pneumonia. Possible small left pleural effusion. Electronically Signed   By: Elmer Picker M.D.   On: 03/01/2021 18:38   DG Abd Portable 1V-Small Bowel Protocol-Position Verification  Result Date: 03/02/2021 CLINICAL DATA:  88 year old male NG tube advancement. Known large hiatal hernia. EXAM: PORTABLE ABDOMEN - 1 VIEW COMPARISON:  0425 hours. FINDINGS: Portable AP upright view at 0435 hours. Enteric tube has been advanced further in the known gastric hiatal hernia. Side hole now projects near the level of the tube tip on the prior exam. Tube tip now in the midline, but still above the diaphragm. Otherwise stable chest and abdomen. IMPRESSION: Enteric tube advanced further into the known gastric hiatal hernia, remaining above the diaphragm. Electronically Signed   By: Genevie Ann M.D.   On: 03/02/2021 05:24   DG Abd Portable 1V-Small Bowel Protocol-Position Verification  Result Date: 03/01/2021 CLINICAL DATA:  NG tube placement. EXAM: PORTABLE ABDOMEN - 1 VIEW COMPARISON:  03/01/2021. FINDINGS: Mildly distended loops of small bowel are seen in the abdomen measuring up  to 3 cm. There is a moderate to large hiatal hernia to the right of midline. The NG tube terminates in the region of a hiatal hernia to the right of midline. Sternotomy wires are noted over the midline. Strandy opacities are present in the lungs. Degenerative changes are noted in the thoracolumbar spine. IMPRESSION: 1. The NG tube terminates in the region of a moderate to large hiatal hernia and repositioning is recommended. 2. Mildly distended loops of small bowel in the abdomen measuring up to 3.0 cm, possible small bowel obstruction. 3. Strandy opacities at the lung bases, possible atelectasis, edema, or infiltrate. Electronically Signed   By: Brett Fairy M.D.   On: 03/01/2021 21:24    Anti-infectives: Anti-infectives (From admission, onward)    Start     Dose/Rate Route Frequency Ordered Stop   03/02/21 2000  cefTRIAXone (ROCEPHIN) 2 g in sodium chloride 0.9 % 100 mL IVPB        2 g 200 mL/hr over 30 Minutes Intravenous Every 24 hours 03/01/21 2047 03/07/21 1959   03/01/21 2200  azithromycin (ZITHROMAX) 500 mg in sodium chloride 0.9 % 250 mL IVPB        500 mg 250 mL/hr over 60 Minutes Intravenous Every 24 hours 03/01/21 2047 03/06/21 2159   03/01/21 1915  cefTRIAXone (ROCEPHIN) 1 g in sodium chloride 0.9 % 100 mL IVPB  1 g 200 mL/hr over 30 Minutes Intravenous  Once 03/01/21 1906 03/01/21 1957   03/01/21 1915  metroNIDAZOLE (FLAGYL) IVPB 500 mg        500 mg 100 mL/hr over 60 Minutes Intravenous Every 12 hours 03/01/21 1906          Assessment/Plan  Angel Cooper is an 88 y.o. male with  pSBO - likely related to adhesive disease from previous surgery - NG tube to LIWS  - start SB protocol with gastrografin  - no emergent surgical needs, continue attempted non-operative management  - mobilize as able   Moderate hiatal hernia  Probable PNA COVID postitive - per pt diagnosed 02/08/21 CKD HTN H/o CHF Afib - went into RVR, on cardizem gtt and hep gtt leukocytosis      LOS: 1 day   I reviewed nursing notes, hospitalist notes, last 24 h vitals and pain scores, last 48 h intake and output, last 24 h labs and trends, and last 24 h imaging results.  This care required moderate level of medical decision making.   Obie Dredge, PA-C [facility] Please see Amion for pager number during day hours 7:00am-4:30pm

## 2021-03-02 NOTE — Progress Notes (Signed)
ANTICOAGULATION CONSULT NOTE   Pharmacy Consult for heparin Indication: atrial fibrillation  Allergies  Allergen Reactions   Aspirin Other (See Comments)    Stomach pain    Chlorhexidine Other (See Comments)    CAUSED SKIN IRRITATION AND BURNING SENSATION   Enalapril Other (See Comments)    Cannot take due to kidney compromise   Gabapentin Other (See Comments)    Caused nightmares   Statins Other (See Comments)    Myalgia    Patient Measurements: Height: 5' 4"  (162.6 cm) Weight: 52.2 kg (115 lb 1.3 oz) IBW/kg (Calculated) : 59.2 Heparin Dosing Weight: 52.2 kg   Vital Signs: Temp: 97.8 F (36.6 C) (02/04 1513) Temp Source: Oral (02/04 1513) BP: 136/62 (02/04 1950) Pulse Rate: 85 (02/04 1950)  Labs: Recent Labs    03/01/21 1753 03/02/21 0226 03/02/21 0227 03/02/21 0805 03/02/21 1939  HGB 11.3*  --  10.1*  --   --   HCT 35.4*  --  31.8*  --   --   PLT 686*  --  595*  --   --   HEPARINUNFRC  --   --   --  <0.10* 0.21*  CREATININE 1.51* 1.38*  --   --   --      Estimated Creatinine Clearance: 26.8 mL/min (A) (by C-G formula based on SCr of 1.38 mg/dL (H)).   Medical History: Past Medical History:  Diagnosis Date   Atrial fibrillation ([place])    2015  W/ RVR   Bladder tumor    CHF (congestive heart failure) ([place]) 2015   CKD (chronic kidney disease), stage III (HCC)    Coronary artery disease    NO LONGER SEES CARDIOLOGIST - LAST SAW DR. [name] 2 OR MORE YRS AGO-PT SEES PCP DR [name]   GERD (gastroesophageal reflux disease)    Hiatal hernia    History of colon polyps    History of gastric ulcer    1989  peptic and duodenal   History of MI (myocardial infarction)    1995   History of non-ST elevation myocardial infarction (NSTEMI)    07-05-1999  s/p cabg   Hyperlipidemia    Hypertension    OA (osteoarthritis)    Osteomyelitis ([place]) 12/07/2018   S/P CABG (coronary artery bypass graft)    06/ 2001   Ureteral carcinoma, right [facility]) urologist-  dr  herrick/  oncologist- dr [name]   dx 2014  s/p  right nephroureterectomy 01-24-2013 (T3, N0) invasive high grade urothelial carcinoma--     Medications:  Medications Prior to Admission  Medication Sig Dispense Refill Last Dose   acetaminophen (TYLENOL) 500 MG tablet Take 1,000 mg by mouth every 6 (six) hours as needed for mild pain or headache.    02/28/2021   allopurinol (ZYLOPRIM) 100 MG tablet Take 0.5 tablets (50 mg total) by mouth daily. 30 tablet 3 03/01/2021   ALPRAZolam (XANAX) 0.25 MG tablet Take 0.25 mg by mouth at bedtime as needed for sleep.    02/28/2021   amLODipine (NORVASC) 10 MG tablet Take 10 mg by mouth daily.   03/01/2021   amoxicillin (AMOXIL) 500 MG capsule Take 500 mg by mouth 2 (two) times daily.   03/01/2021   bisacodyl (FLEET) 10 MG/30ML ENEM Place 10 mg rectally once.   03/01/2021   Cyanocobalamin (B-12 COMPLIANCE INJECTION) 1000 MCG/ML KIT Inject 1,000 mcg as directed every 30 (thirty) days.   02/28/2021   doxycycline (VIBRA-TABS) 100 MG tablet Take 100 mg by mouth 2 (two)  times daily.   03/01/2021   furosemide (LASIX) 40 MG tablet Take 80 mg by mouth every morning.    03/01/2021   HYDROcodone-acetaminophen (NORCO/VICODIN) 5-325 MG tablet Take 1 tablet by mouth every 6 (six) hours as needed for severe pain. 20 tablet 0 02/28/2021   sennosides-docusate sodium (SENOKOT-S) 8.6-50 MG tablet Take 2 tablets by mouth daily.   03/01/2021   traMADol (ULTRAM) 50 MG tablet Take 50 mg by mouth every 6 (six) hours as needed for severe pain.   Past Week    Assessment: 32 YOM with h/o Afib on IV heparin.   Repeat HL has trended up but remains subtherapeutic on 1000 units/hr.   Goal of Therapy:  Heparin level 0.3-0.7 units/ml Monitor platelets by anticoagulation protocol: Yes   Plan:  -Increase heparin to 1100 units/hr  -F/u HL in AM  -Monitor daily HL, CBC and s/s of bleeding   Angel Cooper, PharmD., BCCCP Clinical Pharmacist Please refer to [facility] for unit-specific pharmacist

## 2021-03-02 NOTE — Progress Notes (Signed)
ANTICOAGULATION CONSULT NOTE - Initial Consult  Pharmacy Consult for heparin Indication: atrial fibrillation  Allergies  Allergen Reactions   Aspirin Other (See Comments)    Stomach pain    Chlorhexidine Other (See Comments)    CAUSED SKIN IRRITATION AND BURNING SENSATION   Enalapril Other (See Comments)    Cannot take due to kidney compromise   Gabapentin Other (See Comments)    Caused nightmares   Statins Other (See Comments)    Myalgia    Patient Measurements: Height: 5\' 4"  (162.6 cm) Weight: 52.2 kg (115 lb 1.3 oz) IBW/kg (Calculated) : 59.2 Heparin Dosing Weight: 52.2 kg   Vital Signs: BP: 128/56 (02/04 1000) Pulse Rate: 63 (02/04 1000)  Labs: Recent Labs    03/01/21 1753 03/02/21 0226 03/02/21 0227 03/02/21 0805  HGB 11.3*  --  10.1*  --   HCT 35.4*  --  31.8*  --   PLT 686*  --  595*  --   HEPARINUNFRC  --   --   --  <0.10*  CREATININE 1.51* 1.38*  --   --      Estimated Creatinine Clearance: 26.8 mL/min (A) (by C-G formula based on SCr of 1.38 mg/dL (H)).   Medical History: Past Medical History:  Diagnosis Date   Atrial fibrillation ([place])    2015  W/ RVR   Bladder tumor    CHF (congestive heart failure) ([place]) 2015   CKD (chronic kidney disease), stage III (HCC)    Coronary artery disease    NO LONGER SEES CARDIOLOGIST - LAST SAW DR. [name] 2 OR MORE YRS AGO-PT SEES PCP DR [name]   GERD (gastroesophageal reflux disease)    Hiatal hernia    History of colon polyps    History of gastric ulcer    1989  peptic and duodenal   History of MI (myocardial infarction)    1995   History of non-ST elevation myocardial infarction (NSTEMI)    07-05-1999  s/p cabg   Hyperlipidemia    Hypertension    OA (osteoarthritis)    Osteomyelitis ([place]) 12/07/2018   S/P CABG (coronary artery bypass graft)    06/ 2001   Ureteral carcinoma, right [facility]) urologist-  dr herrick/  oncologist- dr [name]   dx 2014  s/p  right nephroureterectomy 01-24-2013 (T3, N0)  invasive high grade urothelial carcinoma--     Medications:  (Not in a hospital admission)  Assessment: 77 YOM with h/o Afib on IV heparin.   Initial HL is undetectable on 800 units/hr. H/H llow, Plt elevated.   Goal of Therapy:  Heparin level 0.3-0.7 units/ml Monitor platelets by anticoagulation protocol: Yes   Plan:  -Heparin 1500 units bolus. Increase heparin infusion to 1000 units/hr   -F/u 8 hr HL -Monitor daily HL, CBC and s/s of bleeding   [name], PharmD., BCCCP Clinical Pharmacist Please refer to [facility] for unit-specific pharmacist

## 2021-03-02 NOTE — Assessment & Plan Note (Signed)
Noted  

## 2021-03-02 NOTE — ED Notes (Signed)
Multiple attempts at NG tube unsuccessful. Paged General Surgery MD.

## 2021-03-02 NOTE — ED Notes (Signed)
Provided update to pt's son Angel Cooper

## 2021-03-02 NOTE — Plan of Care (Signed)
°  Problem: Clinical Measurements: Goal: Ability to maintain clinical measurements within normal limits will improve Outcome: Progressing   Problem: Coping: Goal: Level of anxiety will decrease Outcome: Progressing   Problem: Skin Integrity: Goal: Risk for impaired skin integrity will decrease Outcome: Progressing

## 2021-03-02 NOTE — Hospital Course (Addendum)
88 year old male with CKD 3B, paroxysmal A. fib, history of renal resection due to [place], HTN, HLD comes into the hospital with abdominal pain for the past 3 days.  Apparently he was diagnosed with COVID as an outpatient about 2 weeks ago, was prescribed medication but never took them.  He saw his PCP a few days ago, had some respiratory symptoms and a chest x-ray showed pneumonia.  He then developed abdominal discomfort and came to the hospital.  He was found to have a small bowel obstruction, A. fib with RVR as well as left lobar pneumonia.  General surgery consulted.  He improved with conservative management and his NG tube was discontinued 2/6.  PT recommends SNF

## 2021-03-03 ENCOUNTER — Inpatient Hospital Stay (HOSPITAL_COMMUNITY): Payer: Medicare HMO

## 2021-03-03 DIAGNOSIS — I4891 Unspecified atrial fibrillation: Secondary | ICD-10-CM

## 2021-03-03 DIAGNOSIS — I25810 Atherosclerosis of coronary artery bypass graft(s) without angina pectoris: Secondary | ICD-10-CM | POA: Diagnosis not present

## 2021-03-03 DIAGNOSIS — M25511 Pain in right shoulder: Secondary | ICD-10-CM

## 2021-03-03 DIAGNOSIS — D638 Anemia in other chronic diseases classified elsewhere: Secondary | ICD-10-CM | POA: Diagnosis not present

## 2021-03-03 LAB — ECHOCARDIOGRAM COMPLETE
AR max vel: 1.91 cm2
AV Area VTI: 1.99 cm2
AV Area mean vel: 1.74 cm2
AV Mean grad: 7 mmHg
AV Peak grad: 13.5 mmHg
Ao pk vel: 1.84 m/s
Area-P 1/2: 4.71 cm2
Calc EF: 52.6 %
Height: 64 in
MV VTI: 3.01 cm2
P 1/2 time: 263 msec
S' Lateral: 4.2 cm
Single Plane A2C EF: 63.1 %
Single Plane A4C EF: 39.6 %
Weight: 2052.92 oz

## 2021-03-03 LAB — BASIC METABOLIC PANEL
Anion gap: 16 — ABNORMAL HIGH (ref 5–15)
BUN: 24 mg/dL — ABNORMAL HIGH (ref 8–23)
CO2: 22 mmol/L (ref 22–32)
Calcium: 8.6 mg/dL — ABNORMAL LOW (ref 8.9–10.3)
Chloride: 100 mmol/L (ref 98–111)
Creatinine, Ser: 1.61 mg/dL — ABNORMAL HIGH (ref 0.61–1.24)
GFR, Estimated: 41 mL/min — ABNORMAL LOW (ref >60)
Glucose, Bld: 88 mg/dL (ref 70–99)
Potassium: 3.8 mmol/L (ref 3.5–5.1)
Sodium: 138 mmol/L (ref 135–145)

## 2021-03-03 LAB — HEPARIN LEVEL (UNFRACTIONATED)
Heparin Unfractionated: 0.23 IU/mL — ABNORMAL LOW (ref 0.30–0.70)
Heparin Unfractionated: 0.16 IU/mL — ABNORMAL LOW (ref 0.30–0.70)
Heparin Unfractionated: 0.12 IU/mL — ABNORMAL LOW (ref 0.30–0.70)

## 2021-03-03 LAB — CBC
HCT: 30.5 % — ABNORMAL LOW (ref 39.0–52.0)
Hemoglobin: 9.9 g/dL — ABNORMAL LOW (ref 13.0–17.0)
MCH: 28.9 pg (ref 26.0–34.0)
MCHC: 32.5 g/dL (ref 30.0–36.0)
MCV: 88.9 fL (ref 80.0–100.0)
Platelets: 572 10*3/uL — ABNORMAL HIGH (ref 150–400)
RBC: 3.43 MIL/uL — ABNORMAL LOW (ref 4.22–5.81)
RDW: 17.2 % — ABNORMAL HIGH (ref 11.5–15.5)
WBC: 13.9 10*3/uL — ABNORMAL HIGH (ref 4.0–10.5)
nRBC: 0 % (ref 0.0–0.2)

## 2021-03-03 MED ORDER — HYDROMORPHONE HCL 1 MG/ML IJ SOLN
0.5000 mg | INTRAMUSCULAR | Status: DC | PRN
  Administered 2021-03-03 – 2021-03-05 (×7): 0.5 mg via INTRAVENOUS
  Filled 2021-03-03 (×7): qty 0.5

## 2021-03-03 MED ORDER — HEPARIN (PORCINE) 25000 UT/250ML-% IV SOLN
1450.0000 [IU]/h | INTRAVENOUS | Status: DC
  Administered 2021-03-04 – 2021-03-05 (×3): 1450 [IU]/h via INTRAVENOUS
  Filled 2021-03-03 (×2): qty 250

## 2021-03-03 MED ORDER — ORAL CARE MOUTH RINSE
15.0000 mL | Freq: Two times a day (BID) | OROMUCOSAL | Status: DC
  Administered 2021-03-03 – 2021-03-11 (×14): 15 mL via OROMUCOSAL

## 2021-03-03 MED ORDER — ARGATROBAN 50 MG/50ML IV SOLN
1.0000 ug/kg/min | INTRAVENOUS | Status: DC
  Filled 2021-03-03: qty 50

## 2021-03-03 NOTE — Assessment & Plan Note (Addendum)
-   Apparently tested positive for COVID 2 weeks ago.  Given lack of hard proof, keep on isolation for now.  Lung infiltrates either represents post-COVID pneumonitis versus bacterial infection.  Currently on antibiotics.  Can probably take airborne precautions at day #5

## 2021-03-03 NOTE — Progress Notes (Signed)
ANTICOAGULATION CONSULT NOTE   Pharmacy Consult for IV heparin Indication: atrial fibrillation  Patient Measurements: Height: 5\' 4"  (162.6 cm) Weight: 58.2 kg (128 lb 4.9 oz) IBW/kg (Calculated) : 59.2 Heparin Dosing Weight: 52.2 kg   Vital Signs: Temp: 98.3 F (36.8 C) (02/05 1213) Temp Source: Oral (02/05 1213) BP: 133/70 (02/05 1213) Pulse Rate: 92 (02/05 1213)  Labs: Recent Labs    03/01/21 1753 03/02/21 0226 03/02/21 0227 03/02/21 0805 03/02/21 1939 03/03/21 0206 03/03/21 1147  HGB 11.3*  --  10.1*  --   --  9.9*  --   HCT 35.4*  --  31.8*  --   --  30.5*  --   PLT 686*  --  595*  --   --  572*  --   HEPARINUNFRC  --   --   --    < > 0.21* 0.23* 0.16*  CREATININE 1.51* 1.38*  --   --   --   --  1.61*   < > = values in this interval not displayed.     Estimated Creatinine Clearance: 25.6 mL/min (A) (by C-G formula based on SCr of 1.61 mg/dL (H)).  Assessment: 88 yo M presents on 2/3 with small bowel obstruction. PMH significant for Afib. No PTA AC. CHADs2-VASC 4. Pharmacy consulted to start IV heparin.   Heparin IV infusion current rate: 1250 units/hr  Running appropriately in room. No issues noted with infusion.   Heparin level: 0.16, subtherapeutic   Goal of Therapy:  Heparin level 0.3-0.7 units/ml Monitor platelets by anticoagulation protocol: Yes   Plan:  Increase heparin to 1450 units/hr. No bolus.  Heparin level in 8 hours Monitor daily HL, CBC and s/s of bleeding   Thank you for allowing pharmacy to participate in this patient's care.  Levonne Spiller, PharmD PGY1 Acute Care Resident  03/03/2021,12:50 PM

## 2021-03-03 NOTE — Progress Notes (Addendum)
ANTICOAGULATION CONSULT NOTE   Pharmacy Consult for IV heparin   Indication: atrial fibrillation  Patient Measurements: Height: 5\' 4"  (162.6 cm) Weight: 58.2 kg (128 lb 4.9 oz) IBW/kg (Calculated) : 59.2 Heparin Dosing Weight: 52.2 kg   Vital Signs: Temp: 100.3 F (37.9 C) (02/05 2013) Temp Source: Oral (02/05 2013) BP: 135/72 (02/05 2013) Pulse Rate: 97 (02/05 2013)  Labs: Recent Labs    03/01/21 1753 03/02/21 0226 03/02/21 0227 03/02/21 0805 03/03/21 0206 03/03/21 1147 03/03/21 2006  HGB 11.3*  --  10.1*  --  9.9*  --   --   HCT 35.4*  --  31.8*  --  30.5*  --   --   PLT 686*  --  595*  --  572*  --   --   HEPARINUNFRC  --   --   --    < > 0.23* 0.16* 0.12*  CREATININE 1.51* 1.38*  --   --   --  1.61*  --    < > = values in this interval not displayed.    Estimated Creatinine Clearance: 25.6 mL/min (A) (by C-G formula based on SCr of 1.61 mg/dL (H)).  Assessment: 88 yo M presents on 2/3 with small bowel obstruction. PMH significant for Afib. No PTA AC. CHADs2-VASC 4. Pharmacy consulted to start IV heparin.   Heparin level 0.12 is subtherapeutic on 1450 units/hr = 27 units/kg/hr indicating possible heparin resistance. Per RN, heparin bag was dry for at least 15 minutes around shift change, possibly longer. Heparin has been running continuously since 20:20. Spoke with MD who agrees with checking another level at 01:00 and switching to argatroban (given CKD) if still subtherapeutic.    Goal of Therapy:  Heparin level 0.3-0.7 units/ml Monitor platelets by anticoagulation protocol: Yes   Plan:  Continue heparin to 1450 units/hr  F/u 01:00 aPTT and HL  Switch to argatroban if levels still subtherapeutic  Monitor daily HL, CBC and s/s of bleeding   Thank you for allowing pharmacy to participate in this patient's care.  Benetta Spar, PharmD, BCPS, BCCP Clinical Pharmacist  Please check AMION for all [place] phone numbers After 10:00 PM, call [place]  [phone]

## 2021-03-03 NOTE — Progress Notes (Signed)
*  PRELIMINARY RESULTS* Echocardiogram 2D Echocardiogram has been performed.  Angel Cooper 03/03/2021, 2:47 PM

## 2021-03-03 NOTE — Progress Notes (Signed)
PROGRESS NOTE  Angel Cooper ASN:[id number] DOB: 07-16-1930 DOA: 03/01/2021 PCP: [name], MD   LOS: 2 days   Brief Narrative / Interim history: 88 year old male with CKD 3B, paroxysmal A. fib, history of renal resection due to [place], HTN, HLD comes into the hospital with abdominal pain for the past 3 days.  Apparently he was diagnosed with COVID as an outpatient about 2 weeks ago, was prescribed medication but never took them.  He saw his PCP a few days ago, had some respiratory symptoms and a chest x-ray showed pneumonia.  He then developed abdominal discomfort and came to the hospital.  He was found to have a small bowel obstruction, A. fib with RVR as well as left lobar pneumonia.  General surgery consulted  Subjective / 24h Interval events: -Complains of right shoulder pain, started this morning upon waking up.  He is not sure what might of caused it.  Denies any chest pain, denies any shortness of breath  Assessment & Plan: * Partial small bowel obstruction ([place])- (present on admission) - conservative management for now, n.p.o., NG tube, IV fluids.  General surgery consulted and following.  Appreciate input.  Atrial fibrillation with RVR ([place])- (present on admission) - Patient was started on Cardizem, continue for now while strict n.p.o.  Heart rate is controlled.  He is also on heparin for anticoagulation, continue  CAP (community acquired pneumonia) -Started on antibiotics with ceftriaxone and azithromycin, continue.  Respiratory status is stable  Chronic kidney disease, stage 3b ([place])- (present on admission) - Baseline creatinine around 1.5.  Currently at baseline.  On IV fluids, closely monitor renal function  Essential hypertension- (present on admission) - Hold BP meds, blood pressure controlled, on Cardizem infusion  Anemia of chronic disease- (present on admission) -Hemoglobin stable, no bleeding, continue heparin  Right shoulder pain - New onset this morning, no  trauma, unclear as to why.  States that pain shoots down onto the right arm and forearm  History of COVID-19 - Apparently tested positive for COVID 2 weeks ago.  Given lack of hard proof, keep on isolation for now.  Lung infiltrates either represents post-COVID pneumonitis versus bacterial infection.  Currently on antibiotics  Personal history of other malignant neoplasm of kidney - Noted  CAD (coronary artery disease) of artery bypass graft- (present on admission) - No chest pain    Scheduled Meds:  feeding supplement  237 mL Oral BID BM   Continuous Infusions:  sodium chloride 50 mL/hr at 03/03/21 0700   azithromycin Stopped (03/02/21 2249)   cefTRIAXone (ROCEPHIN)  IV Stopped (03/02/21 2135)   diltiazem (CARDIZEM) infusion 5 mg/hr (03/03/21 0700)   heparin 1,250 Units/hr (03/03/21 0700)   metronidazole 100 mL/hr at 03/03/21 0700   PRN Meds:.HYDROmorphone (DILAUDID) injection, LORazepam, ondansetron (ZOFRAN) IV  Diet Orders (From admission, onward)     Start     Ordered   03/01/21 2048  Diet NPO time specified  Diet effective now        03/01/21 2047            DVT prophylaxis: SCDs Start: 03/01/21 2048   Lab Results  Component Value Date   PLT 572 (H) 03/03/2021      Code Status: Full Code  Family Communication: No family at bedside  Status is: Inpatient  Remains inpatient appropriate because: Severity of illness  Level of care: Progressive  Consultants:  General surgery  Procedures:  none  Microbiology  none  Antimicrobials: Ceftriaxone / Vanc / Metronidazole  Objective: Vitals:   03/03/21 0302 03/03/21 0330 03/03/21 0752 03/03/21 0811  BP: (!) 128/55  118/61 129/63  Pulse: 84  84 77  Resp: 18  19 20   Temp: 98.8 F (37.1 C) 98.7 F (37.1 C)  98.2 F (36.8 C)  TempSrc: Axillary Oral  Oral  SpO2: 91%  91% 91%  Weight:  58.2 kg    Height:        Intake/Output Summary (Last 24 hours) at 03/03/2021 1034 Last data filed at 03/03/2021  0700 Gross per 24 hour  Intake 1788.03 ml  Output 375 ml  Net 1413.03 ml    Wt Readings from Last 3 Encounters:  03/03/21 58.2 kg  01/29/21 52.2 kg  04/17/18 46.7 kg    Examination:  Constitutional: NAD Eyes: lids and conjunctivae normal, no scleral icterus ENMT: mmm Neck: normal, supple Respiratory: Faint bibasilar rhonchi, no wheezing. Normal respiratory effort.  Cardiovascular: Regular rate and rhythm, no murmurs / rubs / gallops. No LE edema. Abdomen: soft, no distention, no tenderness. Bowel sounds positive.  Skin: no rashes Neurologic: no focal deficits, equal strength   Data Reviewed: I have independently reviewed following labs and imaging studies   CBC Recent Labs  Lab 03/01/21 1753 03/02/21 0227 03/03/21 0206  WBC 15.5* 14.3* 13.9*  HGB 11.3* 10.1* 9.9*  HCT 35.4* 31.8* 30.5*  PLT 686* 595* 572*  MCV 88.5 89.3 88.9  MCH 28.3 28.4 28.9  MCHC 31.9 31.8 32.5  RDW 17.1* 17.1* 17.2*  LYMPHSABS 1.0  --   --   MONOABS 1.1*  --   --   EOSABS 0.1  --   --   BASOSABS 0.1  --   --      Recent Labs  Lab 03/01/21 1753 03/01/21 2053 03/02/21 0226  NA 133*  --  135  K 3.8  --  4.1  CL 97*  --  99  CO2 24  --  24  GLUCOSE 118*  --  111*  BUN 31*  --  27*  CREATININE 1.51*  --  1.38*  CALCIUM 8.7*  --  8.2*  AST 20  --  15  ALT 6  --  7  ALKPHOS 71  --  61  BILITOT 0.8  --  0.4  ALBUMIN 3.1*  --  2.6*  MG 1.6*  --  2.0  LATICACIDVEN 2.0* 1.7  --      ------------------------------------------------------------------------------------------------------------------ No results for input(s): CHOL, HDL, LDLCALC, TRIG, CHOLHDL, LDLDIRECT in the last 72 hours.  No results found for: HGBA1C ------------------------------------------------------------------------------------------------------------------ No results for input(s): TSH, T4TOTAL, T3FREE, THYROIDAB in the last 72 hours.  Invalid input(s): FREET3  Cardiac Enzymes No results for input(s):  CKMB, TROPONINI, MYOGLOBIN in the last 168 hours.  Invalid input(s): CK ------------------------------------------------------------------------------------------------------------------ No results found for: BNP  CBG: No results for input(s): GLUCAP in the last 168 hours.  Recent Results (from the past 240 hour(s))  Blood culture (routine x 2)     Status: None (Preliminary result)   Collection Time: 03/01/21  7:00 PM   Specimen: BLOOD RIGHT ARM  Result Value Ref Range Status   Specimen Description BLOOD RIGHT ARM  Final   Special Requests   Final    BOTTLES DRAWN AEROBIC AND ANAEROBIC Blood Culture results may not be optimal due to an excessive volume of blood received in culture bottles   Culture   Final    NO GROWTH < 24 HOURS Performed at [facility], [place] [address],  [place], [place] [address]    Report Status PENDING  Incomplete  Blood culture (routine x 2)     Status: None (Preliminary result)   Collection Time: 03/01/21  7:05 PM   Specimen: BLOOD LEFT ARM  Result Value Ref Range Status   Specimen Description BLOOD LEFT ARM  Final   Special Requests   Final    BOTTLES DRAWN AEROBIC AND ANAEROBIC Blood Culture adequate volume   Culture   Final    NO GROWTH < 24 HOURS Performed at [facility], [place] [address] [place], [place] [address]    Report Status PENDING  Incomplete  Respiratory (~20 pathogens) panel by PCR     Status: None   Collection Time: 03/01/21  8:11 PM   Specimen: Nasopharyngeal Swab; Respiratory  Result Value Ref Range Status   Adenovirus NOT DETECTED NOT DETECTED Final   Coronavirus 229E NOT DETECTED NOT DETECTED Final    Comment: (NOTE) The Coronavirus on the Respiratory Panel, DOES NOT test for the novel  Coronavirus (2019 nCoV)    Coronavirus HKU1 NOT DETECTED NOT DETECTED Final   Coronavirus NL63 NOT DETECTED NOT DETECTED Final   Coronavirus OC43 NOT DETECTED NOT DETECTED Final   Metapneumovirus NOT DETECTED NOT DETECTED Final    Rhinovirus / Enterovirus NOT DETECTED NOT DETECTED Final   Influenza A NOT DETECTED NOT DETECTED Final   Influenza B NOT DETECTED NOT DETECTED Final   Parainfluenza Virus 1 NOT DETECTED NOT DETECTED Final   Parainfluenza Virus 2 NOT DETECTED NOT DETECTED Final   Parainfluenza Virus 3 NOT DETECTED NOT DETECTED Final   Parainfluenza Virus 4 NOT DETECTED NOT DETECTED Final   Respiratory Syncytial Virus NOT DETECTED NOT DETECTED Final   Bordetella pertussis NOT DETECTED NOT DETECTED Final   Bordetella Parapertussis NOT DETECTED NOT DETECTED Final   Chlamydophila pneumoniae NOT DETECTED NOT DETECTED Final   Mycoplasma pneumoniae NOT DETECTED NOT DETECTED Final    Comment: Performed at [facility], [place]. [address], [place], [place] [address]  Resp Panel by RT-PCR (Flu A&B, Covid) Nasopharyngeal Swab     Status: Abnormal   Collection Time: 03/02/21  3:12 AM   Specimen: Nasopharyngeal Swab; Nasopharyngeal(NP) swabs in vial transport medium  Result Value Ref Range Status   SARS Coronavirus 2 by RT PCR POSITIVE (A) NEGATIVE Final    Comment: (NOTE) SARS-CoV-2 target nucleic acids are DETECTED.  The SARS-CoV-2 RNA is generally detectable in upper respiratory specimens during the acute phase of infection. Positive results are indicative of the presence of the identified virus, but do not rule out bacterial infection or co-infection with other pathogens not detected by the test. Clinical correlation with patient history and other diagnostic information is necessary to determine patient infection status. The expected result is Negative.  Fact Sheet for Patients: EntrepreneurPulse.com.au  Fact Sheet for Healthcare Providers: IncredibleEmployment.be  This test is not yet approved or cleared by the [place] FDA and  has been authorized for detection and/or diagnosis of SARS-CoV-2 by FDA under an Emergency Use Authorization (EUA).  This EUA  will remain in effect (meaning this test can be used) for the duration of  the COVID-19 declaration under Section 564(b)(1) of the A ct, 21 U.S.C. section 360bbb-3(b)(1), unless the authorization is terminated or revoked sooner.     Influenza A by PCR NEGATIVE NEGATIVE Final   Influenza B by PCR NEGATIVE NEGATIVE Final    Comment: (NOTE) The Xpert Xpress SARS-CoV-2/FLU/RSV plus assay is intended as an aid  in the diagnosis of influenza from Nasopharyngeal swab specimens and should not be used as a sole basis for treatment. Nasal washings and aspirates are unacceptable for Xpert Xpress SARS-CoV-2/FLU/RSV testing.  Fact Sheet for Patients: EntrepreneurPulse.com.au  Fact Sheet for Healthcare Providers: IncredibleEmployment.be  This test is not yet approved or cleared by the [place] FDA and has been authorized for detection and/or diagnosis of SARS-CoV-2 by FDA under an Emergency Use Authorization (EUA). This EUA will remain in effect (meaning this test can be used) for the duration of the COVID-19 declaration under Section 564(b)(1) of the Act, 21 U.S.C. section 360bbb-3(b)(1), unless the authorization is terminated or revoked.  Performed at [facility], [place] [address] [place], [place] [address]       Radiology Studies: DG Abd Portable 1V-Small Bowel Obstruction Protocol-initial, 8 hr delay  Result Date: 03/02/2021 CLINICAL DATA:  Small-bowel obstruction. EXAM: PORTABLE ABDOMEN - 1 VIEW COMPARISON:  Comparison studies from earlier same day. FINDINGS: Current study performed at 6 p.m. Contrast is seen within the stomach. No contrast seen within the more distal small bowel. Distended gas-filled small bowel loops throughout the abdomen and upper pelvis, consistent with a distal small-bowel obstruction. IMPRESSION: Distal small bowel obstruction. Contrast is seen within the stomach. No contrast passage into the more distal small bowel.  Electronically Signed   By: Franki Cabot M.D.   On: 03/02/2021 19:07     Marzetta Board, MD, PhD [facility]  Between 7 am - 7 pm I am available, please contact me via Amion (for emergencies) or Securechat (non urgent messages)  Between 7 pm - 7 am I am not available, please contact night coverage MD/APP via Amion

## 2021-03-03 NOTE — Progress Notes (Signed)
ANTICOAGULATION CONSULT NOTE - Follow Up Consult  Pharmacy Consult for heparin Indication: atrial fibrillation  Labs: Recent Labs    03/01/21 1753 03/02/21 0226 03/02/21 0227 03/02/21 0805 03/02/21 1939 03/03/21 0206  HGB 11.3*  --  10.1*  --   --  9.9*  HCT 35.4*  --  31.8*  --   --  30.5*  PLT 686*  --  595*  --   --  572*  HEPARINUNFRC  --   --   --  <0.10* 0.21* 0.23*  CREATININE 1.51* 1.38*  --   --   --   --     Assessment: 88yo male subtherapeutic on heparin with very little change in level despite rate change; no infusion issues or signs of bleeding per RN.  Goal of Therapy:  Heparin level 0.3-0.7 units/ml   Plan:  Will increase heparin infusion by 2-3 units/kg/hr to 1250 units/hr and check level in 8 hours.    Wynona Neat, PharmD, BCPS  03/03/2021,3:53 AM

## 2021-03-03 NOTE — Progress Notes (Signed)
[facility] Progress Note     Subjective: CC:  No abdominal pain, nausea or vomiting with NG in place. Bilious effluent in canister. Patient is unsure of his last flatus. Last BM was Friday after enema. Confirms history of nephroureterectomy 2014 with Dr. Louis Cooper. Also reports removal of a bladder tumor.    Objective: Vital signs in last 24 hours: Temp:  [97.8 F (36.6 C)-98.8 F (37.1 C)] 98.2 F (36.8 C) (02/05 0811) Pulse Rate:  [43-107] 77 (02/05 0811) Resp:  [14-25] 20 (02/05 0811) BP: (118-141)/(53-73) 129/63 (02/05 0811) SpO2:  [90 %-98 %] 91 % (02/05 0811) Weight:  [58.2 kg] 58.2 kg (02/05 0330) Last BM Date: 03/01/21  Intake/Output from previous day: 02/04 0701 - 02/05 0700 In: 2049.1 [I.V.:1494.1; IV Piggyback:555] Out: 375 [Urine:125; Emesis/NG output:250] Intake/Output this shift: No intake/output data recorded.  PE: Gen:  Alert, NAD, pleasant, chronically ill appearing, cachectic Card:  rrr Pulm:  Normal effort, clear to auscultation bilaterally Abd: Soft, non-tender, non-distended, no HSM, no peritonitis  Skin: warm and dry, no rashes  Psych: A&Ox3   Lab Results:  Recent Labs    03/02/21 0227 03/03/21 0206  WBC 14.3* 13.9*  HGB 10.1* 9.9*  HCT 31.8* 30.5*  PLT 595* 572*   BMET Recent Labs    03/01/21 1753 03/02/21 0226  NA 133* 135  K 3.8 4.1  CL 97* 99  CO2 24 24  GLUCOSE 118* 111*  BUN 31* 27*  CREATININE 1.51* 1.38*  CALCIUM 8.7* 8.2*   PT/INR No results for input(s): LABPROT, INR in the last 72 hours. CMP     Component Value Date/Time   NA 135 03/02/2021 0226   NA 139 02/22/2013 1032   K 4.1 03/02/2021 0226   K 5.0 02/22/2013 1032   CL 99 03/02/2021 0226   CO2 24 03/02/2021 0226   CO2 23 02/22/2013 1032   GLUCOSE 111 (H) 03/02/2021 0226   GLUCOSE 100 02/22/2013 1032   BUN 27 (H) 03/02/2021 0226   BUN 24.7 02/22/2013 1032   CREATININE 1.38 (H) 03/02/2021 0226   CREATININE 1.5 (H) 02/22/2013 1032   CALCIUM 8.2  (L) 03/02/2021 0226   CALCIUM 9.1 02/22/2013 1032   PROT 5.7 (L) 03/02/2021 0226   PROT 6.9 02/22/2013 1032   ALBUMIN 2.6 (L) 03/02/2021 0226   ALBUMIN 3.6 02/22/2013 1032   AST 15 03/02/2021 0226   AST 11 02/22/2013 1032   ALT 7 03/02/2021 0226   ALT <6 02/22/2013 1032   ALKPHOS 61 03/02/2021 0226   ALKPHOS 75 02/22/2013 1032   BILITOT 0.4 03/02/2021 0226   BILITOT 0.36 02/22/2013 1032   GFRNONAA 49 (L) 03/02/2021 0226   GFRAA 51 (L) 12/08/2018 0508   Lipase     Component Value Date/Time   LIPASE 24 03/01/2021 1753       Studies/Results: CT ABDOMEN PELVIS WO CONTRAST  Result Date: 03/01/2021 CLINICAL DATA:  Abdominal pain and constipation for 3-4 days. EXAM: CT ABDOMEN AND PELVIS WITHOUT CONTRAST TECHNIQUE: Multidetector CT imaging of the abdomen and pelvis was performed following the standard protocol without IV contrast. RADIATION DOSE REDUCTION: This exam was performed according to the departmental dose-optimization program which includes automated exposure control, adjustment of the mA and/or kV according to patient size and/or use of iterative reconstruction technique. COMPARISON:  04/17/2018 FINDINGS: Lower chest: Bilateral peripheral predominant multifocal peribronchovascular ground-glass and airspace densities are noted compatible with multifocal infection. Atypical viral infection is not excluded. Small left pleural effusion. Hepatobiliary: There is  no focal liver abnormality. Gallbladder unremarkable. Pancreas: Unremarkable. No pancreatic ductal dilatation or surrounding inflammatory changes. Spleen: Normal in size without focal abnormality. Adrenals/Urinary Tract: Normal adrenal glands. Status post right nephrectomy. Left kidney is unremarkable. Bladder is unremarkable. Stomach/Bowel: Moderate to large hiatal hernia is identified. Stomach appears nondistended. The proximal and mid small bowel loops are increased in caliber with multiple air-fluid levels measuring up to 3.1 cm.  The distal small bowel loops are decreased in caliber. There is an edematous appearing loop of small bowel within the left lower quadrant of the abdomen which is difficult to definitively characterize due to lack of IV and enteric contrast material, but may represent the transition point. The colon appears largely decompressed. Scattered colonic diverticula are identified. Vascular/Lymphatic: Aortic atherosclerosis. No abdominopelvic adenopathy. Signs of venous congestion noted within the lower abdominal mesentery, image 61/3. Reproductive: Prostate is unremarkable. Other: Small volume of ascites identified within the abdomen and pelvis. No signs of pneumoperitoneum. Musculoskeletal: No acute or significant osseous findings. Chronic bilateral L5 pars defects noted with 7 mm anterolisthesis of L5 on S1. Multilevel disc space narrowing and endplate spurring identified IMPRESSION: 1. Examination is positive for small bowel obstruction. The transition point is difficult to identify larger reflecting lack of IV and oral contrast material. 2. There is an edematous appearing loop of small bowel within the left lower quadrant of the abdomen which is difficult to definitively characterize due to lack of IV and enteric contrast material. This may represent a focal segmental area of inflammatory or infectious enteritis. 3. Small volume of ascites identified within the abdomen and pelvis. 4. Bilateral peripheral predominant multifocal peribronchovascular ground-glass and airspace densities are noted compatible with multifocal infection. Atypical viral infection is not excluded. 5. Small left pleural effusion. 6. Hiatal hernia. 7. Chronic bilateral L5 pars defects with 7 mm anterolisthesis of L5 on S1. 8. Aortic Atherosclerosis (ICD10-I70.0). Electronically Signed   By: Kerby Moors M.D.   On: 03/01/2021 18:45   DG Abdomen 1 View  Result Date: 03/02/2021 CLINICAL DATA:  NG tube placement. EXAM: ABDOMEN - 1 VIEW COMPARISON:   03/01/2021 FINDINGS: Low volume film with cardiomegaly and patchy bilateral airspace disease. NG tube tip projects over the lower right mediastinum. The tube does not follow the course of the airway in tip position is compatible with the patient's known large right-sided hiatal hernia (CT 03/01/2021). Diffuse gaseous bowel distention noted right upper quadrant. IMPRESSION: 1. NG tube tip projects over the lower right mediastinum, compatible with placement in the known large right-sided hiatal hernia. 2. Patchy bilateral airspace disease. Electronically Signed   By: Misty Stanley M.D.   On: 03/02/2021 04:58   DG Chest Portable 1 View  Result Date: 03/01/2021 CLINICAL DATA:  Pneumonia EXAM: PORTABLE CHEST 1 VIEW COMPARISON:  Previous studies including the examination of 02/28/2021 FINDINGS: Transverse diameter of heart is increased. There is possible hiatal hernia in the retrocardiac region. There is evidence of previous coronary bypass surgery. Left hemidiaphragm is elevated. There is poor inspiration. There is patchy infiltrate in the left mid and left lower lung fields with worsening of infiltrate in the medial left lower lung fields. There are small linear densities in the right parahilar region and right lower lung fields with no significant change. There is blunting of left lateral CP angle. There is no pneumothorax. IMPRESSION: There are patchy infiltrates in the left parahilar region and left lower lung fields with interval worsening suggesting worsening pneumonia. There is slight improvement in aeration of right  parahilar region suggesting decrease in infiltrate. There is residual prominence of interstitial markings in the right parahilar region and right lower lung fields suggesting possible underlying scarring and superimposed interstitial pneumonia. Possible small left pleural effusion. Electronically Signed   By: Elmer Picker M.D.   On: 03/01/2021 18:38   DG Abd Portable 1V-Small Bowel  Obstruction Protocol-initial, 8 hr delay  Result Date: 03/02/2021 CLINICAL DATA:  Small-bowel obstruction. EXAM: PORTABLE ABDOMEN - 1 VIEW COMPARISON:  Comparison studies from earlier same day. FINDINGS: Current study performed at 6 p.m. Contrast is seen within the stomach. No contrast seen within the more distal small bowel. Distended gas-filled small bowel loops throughout the abdomen and upper pelvis, consistent with a distal small-bowel obstruction. IMPRESSION: Distal small bowel obstruction. Contrast is seen within the stomach. No contrast passage into the more distal small bowel. Electronically Signed   By: Franki Cabot M.D.   On: 03/02/2021 19:07   DG Abd Portable 1V-Small Bowel Protocol-Position Verification  Result Date: 03/02/2021 CLINICAL DATA:  88 year old male NG tube advancement. Known large hiatal hernia. EXAM: PORTABLE ABDOMEN - 1 VIEW COMPARISON:  0425 hours. FINDINGS: Portable AP upright view at 0435 hours. Enteric tube has been advanced further in the known gastric hiatal hernia. Side hole now projects near the level of the tube tip on the prior exam. Tube tip now in the midline, but still above the diaphragm. Otherwise stable chest and abdomen. IMPRESSION: Enteric tube advanced further into the known gastric hiatal hernia, remaining above the diaphragm. Electronically Signed   By: Genevie Ann M.D.   On: 03/02/2021 05:24   DG Abd Portable 1V-Small Bowel Protocol-Position Verification  Result Date: 03/01/2021 CLINICAL DATA:  NG tube placement. EXAM: PORTABLE ABDOMEN - 1 VIEW COMPARISON:  03/01/2021. FINDINGS: Mildly distended loops of small bowel are seen in the abdomen measuring up to 3 cm. There is a moderate to large hiatal hernia to the right of midline. The NG tube terminates in the region of a hiatal hernia to the right of midline. Sternotomy wires are noted over the midline. Strandy opacities are present in the lungs. Degenerative changes are noted in the thoracolumbar spine. IMPRESSION:  1. The NG tube terminates in the region of a moderate to large hiatal hernia and repositioning is recommended. 2. Mildly distended loops of small bowel in the abdomen measuring up to 3.0 cm, possible small bowel obstruction. 3. Strandy opacities at the lung bases, possible atelectasis, edema, or infiltrate. Electronically Signed   By: Brett Fairy M.D.   On: 03/01/2021 21:24    Anti-infectives: Anti-infectives (From admission, onward)    Start     Dose/Rate Route Frequency Ordered Stop   03/02/21 2000  cefTRIAXone (ROCEPHIN) 2 g in sodium chloride 0.9 % 100 mL IVPB        2 g 200 mL/hr over 30 Minutes Intravenous Every 24 hours 03/01/21 2047 03/07/21 1959   03/01/21 2200  azithromycin (ZITHROMAX) 500 mg in sodium chloride 0.9 % 250 mL IVPB        500 mg 250 mL/hr over 60 Minutes Intravenous Every 24 hours 03/01/21 2047 03/06/21 2159   03/01/21 1915  cefTRIAXone (ROCEPHIN) 1 g in sodium chloride 0.9 % 100 mL IVPB        1 g 200 mL/hr over 30 Minutes Intravenous  Once 03/01/21 1906 03/01/21 1957   03/01/21 1915  metroNIDAZOLE (FLAGYL) IVPB 500 mg        500 mg 100 mL/hr over 60 Minutes Intravenous Every 12  hours 03/01/21 1906          Assessment/Plan  Angel Cooper is an 88 y.o. 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 XR yesterday showed potential psbo - no emergent surgical needs, continue non-operative management at present - mobilize as able   Moderate hiatal hernia  Probable PNA COVID postitive - per pt diagnosed 02/08/21 CKD HTN H/o CHF Afib - went into RVR, on cardizem gtt and hep gtt leukocytosis - downtrending    LOS: 2 days   I reviewed nursing notes, hospitalist notes, last 24 h vitals and pain scores, last 48 h intake and output, last 24 h labs and trends, and last 24 h imaging results.  This care required moderate level of medical decision making.    Nadeen Landau, MD [facility], [facility]

## 2021-03-03 NOTE — Evaluation (Signed)
Physical Therapy Evaluation Patient Details Name: Angel Cooper MRN: [id number] DOB: 09/04/1930 Today's Date: 03/03/2021  History of Present Illness  88 y/o male presented to [facility] on 03/01/21 for abdominal pain and constipation x 3 days. Recently seen PCP 2 weeks ago after diagnosed with COVID and CXR showed pneumonia. Found to have small bowel obstruction, Afib with RVR, and L lobar pneumonia. PMH: CKD, Afib, hx of renal resection due to RCC, HTN  Clinical Impression  Patient admitted with above findings. Patient presents with generalized weakness, impaired balance, decreased activity tolerance, and R UE pain. Patient with difficulty putting weight through R hand during transfer. Patient required modA for bed mobility and maxA+2 to attempt standing but unable to achieve upright standing. Patient will benefit from skilled PT services during acute stay to address listed deficits. Recommend HHPT at this time but if patient unable to progress to supervision/modI level then may need SNF at discharge.        Recommendations for follow up therapy are one component of a multi-disciplinary discharge planning process, led by the attending physician.  Recommendations may be updated based on patient status, additional functional criteria and insurance authorization.  Follow Up Recommendations Home health PT (family hopeful; if unable to progress, may need SNF)    Assistance Recommended at Discharge Frequent or constant Supervision/Assistance  Patient can return home with the following  Two people to help with walking and/or transfers;A lot of help with bathing/dressing/bathroom;Assistance with cooking/housework;Assist for transportation;Help with stairs or ramp for entrance    Equipment Recommendations None recommended by PT  Recommendations for Other Services  OT consult    Functional Status Assessment Patient has had a recent decline in their functional status and demonstrates the ability to make  significant improvements in function in a reasonable and predictable amount of time.     Precautions / Restrictions Precautions Precautions: Fall Precaution Comments: NG tube to suction, R elbow/handpain Restrictions Weight Bearing Restrictions: No      Mobility  Bed Mobility Overal bed mobility: Needs Assistance Bed Mobility: Supine to Sit, Sit to Supine     Supine to sit: Mod assist Sit to supine: Mod assist   General bed mobility comments: modA for trunk elevation and bringing LEs back onto bed    Transfers Overall transfer level: Needs assistance Equipment used: 2 person hand held assist Transfers: Sit to/from Stand Sit to Stand: Max assist, +2 physical assistance, +2 safety/equipment           General transfer comment: maxA+2 to attempt standing but unable to fully come into standing due to R UE pain with pulling/pushing    Ambulation/Gait               General Gait Details: unable to due to NG tube hooked up to suction  Stairs            Wheelchair Mobility    Modified Rankin (Stroke Patients Only)       Balance Overall balance assessment: Needs assistance Sitting-balance support: No upper extremity supported, Feet supported Sitting balance-Leahy Scale: Fair     Standing balance support: Bilateral upper extremity supported Standing balance-Leahy Scale: Zero                               Pertinent Vitals/Pain Pain Assessment Pain Assessment: Faces Faces Pain Scale: Hurts even more Pain Location: R hand/elbow Pain Descriptors / Indicators: Aching, Discomfort, Grimacing, Tender Pain Intervention(s): Monitored during  session, Limited activity within patient's tolerance, Repositioned    Home Living Family/patient expects to be discharged to:: Private residence Living Arrangements: Spouse/significant other Available Help at Discharge: Family Type of Home: House Home Access: Stairs to enter;Ramped entrance Entrance  Stairs-Rails: Right Entrance Stairs-Number of Steps: 2-3   Home Layout: One Olga: Conservation officer, nature (2 wheels);Rollator (4 wheels);Shower seat;Cane - single point      Prior Function Prior Level of Function : Independent/Modified Independent             Mobility Comments: Uses 3 wheeled Rollator RW fulltime for amb       Hand Dominance        Extremity/Trunk Assessment   Upper Extremity Assessment Upper Extremity Assessment: RUE deficits/detail RUE: Unable to fully assess due to pain    Lower Extremity Assessment Lower Extremity Assessment: Generalized weakness    Cervical / Trunk Assessment Cervical / Trunk Assessment: Kyphotic  Communication   Communication: No difficulties  Cognition Arousal/Alertness: Awake/alert Behavior During Therapy: WFL for tasks assessed/performed Overall Cognitive Status: Within Functional Limits for tasks assessed                                          General Comments General comments (skin integrity, edema, etc.): VSS on 3L O2 [place]    Exercises     Assessment/Plan    PT Assessment Patient needs continued PT services  PT Problem List Decreased strength;Decreased activity tolerance;Decreased balance;Decreased mobility;Pain       PT Treatment Interventions DME instruction;Gait training;Stair training;Functional mobility training;Therapeutic activities;Therapeutic exercise;Balance training;Patient/family education    PT Goals (Current goals can be found in the Care Plan section)  Acute Rehab PT Goals Patient Stated Goal: to reduce pain in the R hand PT Goal Formulation: With patient/family Time For Goal Achievement: 03/17/21 Potential to Achieve Goals: Fair    Frequency Min 3X/week     Co-evaluation               AM-PAC PT "6 Clicks" Mobility  Outcome Measure Help needed turning from your back to your side while in a flat bed without using bedrails?: A Lot Help needed moving from  lying on your back to sitting on the side of a flat bed without using bedrails?: A Lot Help needed moving to and from a bed to a chair (including a wheelchair)?: Total Help needed standing up from a chair using your arms (e.g., wheelchair or bedside chair)?: Total Help needed to walk in hospital room?: Total Help needed climbing 3-5 steps with a railing? : Total 6 Click Score: 8    End of Session Equipment Utilized During Treatment: Gait belt;Oxygen Activity Tolerance: Patient limited by pain Patient left: in bed;with call bell/phone within reach;with bed alarm set;with family/visitor present Nurse Communication: Mobility status PT Visit Diagnosis: Unsteadiness on feet (R26.81);Muscle weakness (generalized) (M62.81);Difficulty in walking, not elsewhere classified (R26.2)    Time: 0300-9233 PT Time Calculation (min) (ACUTE ONLY): 23 min   Charges:   PT Evaluation $PT Eval Moderate Complexity: 1 Mod PT Treatments $Therapeutic Activity: 8-22 mins        Domingue Coltrain A. Gilford Rile PT, DPT Acute Rehabilitation Services Pager [phone] Office [phone]   Linna Hoff 03/03/2021, 4:33 PM

## 2021-03-03 NOTE — Assessment & Plan Note (Addendum)
-   Started on 2/5, patient reports history of gouty attack at various sites of the body, this reminds him of one.  Received 125 Solu-Medrol 2/6, started on colchicine, significantly better today, he can move his arm now.  Switch to prednisone do a short 4-day course

## 2021-03-04 ENCOUNTER — Other Ambulatory Visit (HOSPITAL_COMMUNITY): Payer: Self-pay

## 2021-03-04 ENCOUNTER — Inpatient Hospital Stay (HOSPITAL_COMMUNITY): Payer: Medicare HMO

## 2021-03-04 DIAGNOSIS — E43 Unspecified severe protein-calorie malnutrition: Secondary | ICD-10-CM | POA: Insufficient documentation

## 2021-03-04 DIAGNOSIS — I4891 Unspecified atrial fibrillation: Secondary | ICD-10-CM | POA: Diagnosis not present

## 2021-03-04 DIAGNOSIS — I25810 Atherosclerosis of coronary artery bypass graft(s) without angina pectoris: Secondary | ICD-10-CM | POA: Diagnosis not present

## 2021-03-04 DIAGNOSIS — D638 Anemia in other chronic diseases classified elsewhere: Secondary | ICD-10-CM | POA: Diagnosis not present

## 2021-03-04 LAB — CBC
HCT: 27.3 % — ABNORMAL LOW (ref 39.0–52.0)
Hemoglobin: 8.6 g/dL — ABNORMAL LOW (ref 13.0–17.0)
MCH: 28.5 pg (ref 26.0–34.0)
MCHC: 31.5 g/dL (ref 30.0–36.0)
MCV: 90.4 fL (ref 80.0–100.0)
Platelets: 486 10*3/uL — ABNORMAL HIGH (ref 150–400)
RBC: 3.02 MIL/uL — ABNORMAL LOW (ref 4.22–5.81)
RDW: 17.2 % — ABNORMAL HIGH (ref 11.5–15.5)
WBC: 17.8 10*3/uL — ABNORMAL HIGH (ref 4.0–10.5)
nRBC: 0 % (ref 0.0–0.2)

## 2021-03-04 LAB — BASIC METABOLIC PANEL
Anion gap: 11 (ref 5–15)
BUN: 26 mg/dL — ABNORMAL HIGH (ref 8–23)
CO2: 22 mmol/L (ref 22–32)
Calcium: 8 mg/dL — ABNORMAL LOW (ref 8.9–10.3)
Chloride: 104 mmol/L (ref 98–111)
Creatinine, Ser: 1.58 mg/dL — ABNORMAL HIGH (ref 0.61–1.24)
GFR, Estimated: 42 mL/min — ABNORMAL LOW (ref >60)
Glucose, Bld: 71 mg/dL (ref 70–99)
Potassium: 3.5 mmol/L (ref 3.5–5.1)
Sodium: 137 mmol/L (ref 135–145)

## 2021-03-04 LAB — HEPARIN LEVEL (UNFRACTIONATED)
Heparin Unfractionated: 0.45 IU/mL (ref 0.30–0.70)
Heparin Unfractionated: 0.56 IU/mL (ref 0.30–0.70)

## 2021-03-04 LAB — LEGIONELLA PNEUMOPHILA SEROGP 1 UR AG: L. pneumophila Serogp 1 Ur Ag: NEGATIVE

## 2021-03-04 LAB — APTT: aPTT: 103 seconds — ABNORMAL HIGH (ref 24–36)

## 2021-03-04 MED ORDER — RENA-VITE PO TABS
1.0000 | ORAL_TABLET | Freq: Every day | ORAL | Status: DC
  Administered 2021-03-04 – 2021-03-10 (×7): 1 via ORAL
  Filled 2021-03-04 (×7): qty 1

## 2021-03-04 MED ORDER — OXYCODONE HCL 5 MG PO TABS
5.0000 mg | ORAL_TABLET | ORAL | Status: DC | PRN
  Administered 2021-03-04 – 2021-03-07 (×7): 5 mg via ORAL
  Filled 2021-03-04 (×7): qty 1

## 2021-03-04 MED ORDER — ENSURE ENLIVE PO LIQD
237.0000 mL | Freq: Three times a day (TID) | ORAL | Status: DC
  Administered 2021-03-04 – 2021-03-11 (×18): 237 mL via ORAL

## 2021-03-04 MED ORDER — COLCHICINE 0.6 MG PO TABS
0.6000 mg | ORAL_TABLET | Freq: Every day | ORAL | Status: DC
  Administered 2021-03-04 – 2021-03-05 (×2): 0.6 mg
  Filled 2021-03-04 (×2): qty 1

## 2021-03-04 MED ORDER — POTASSIUM CHLORIDE CRYS ER 20 MEQ PO TBCR
40.0000 meq | EXTENDED_RELEASE_TABLET | Freq: Once | ORAL | Status: AC
  Administered 2021-03-04: 40 meq via ORAL
  Filled 2021-03-04: qty 2

## 2021-03-04 MED ORDER — ALPRAZOLAM 0.5 MG PO TABS
0.2500 mg | ORAL_TABLET | Freq: Every evening | ORAL | Status: DC | PRN
  Administered 2021-03-05 – 2021-03-07 (×3): 0.25 mg via ORAL
  Filled 2021-03-04 (×4): qty 1

## 2021-03-04 MED ORDER — DILTIAZEM HCL 60 MG PO TABS
30.0000 mg | ORAL_TABLET | Freq: Four times a day (QID) | ORAL | Status: DC
  Administered 2021-03-04 – 2021-03-05 (×3): 30 mg via ORAL
  Filled 2021-03-04 (×3): qty 1

## 2021-03-04 MED ORDER — METHYLPREDNISOLONE SODIUM SUCC 125 MG IJ SOLR
125.0000 mg | Freq: Once | INTRAMUSCULAR | Status: AC
  Administered 2021-03-04: 125 mg via INTRAVENOUS
  Filled 2021-03-04: qty 2

## 2021-03-04 MED ORDER — FUROSEMIDE 40 MG PO TABS
80.0000 mg | ORAL_TABLET | Freq: Every day | ORAL | Status: DC
  Administered 2021-03-04 – 2021-03-09 (×6): 80 mg via ORAL
  Filled 2021-03-04 (×6): qty 2

## 2021-03-04 MED ORDER — ALLOPURINOL 100 MG PO TABS
50.0000 mg | ORAL_TABLET | Freq: Every day | ORAL | Status: DC
  Administered 2021-03-04 – 2021-03-11 (×8): 50 mg via ORAL
  Filled 2021-03-04 (×8): qty 1

## 2021-03-04 NOTE — Progress Notes (Signed)
PROGRESS NOTE  Angel Cooper XKG:[id number] DOB: August 11, 1930 DOA: 03/01/2021 PCP: [name], MD   LOS: 3 days   Brief Narrative / Interim history: 88 year old male with CKD 3B, paroxysmal A. fib, history of renal resection due to [place], HTN, HLD comes into the hospital with abdominal pain for the past 3 days.  Apparently he was diagnosed with COVID as an outpatient about 2 weeks ago, was prescribed medication but never took them.  He saw his PCP a few days ago, had some respiratory symptoms and a chest x-ray showed pneumonia.  He then developed abdominal discomfort and came to the hospital.  He was found to have a small bowel obstruction, A. fib with RVR as well as left lobar pneumonia.  General surgery consulted  Subjective / 24h Interval events: -Right shoulder and elbow pain are much worse today, he can barely move his right arm.  Denies any abdominal pain, no nausea or vomiting, no shortness of breath  Assessment & Plan: * Partial small bowel obstruction ([place])- (present on admission) - conservative management for now, n.p.o., NG tube, IV fluids.  General surgery consulted and following. May remove NG tube later today, he appears to be improving.   Atrial fibrillation with RVR ([place])- (present on admission) - Patient was started on Cardizem, continue for now while strict n.p.o.  Heart rate is controlled.  He is also on heparin for anticoagulation, continue. If his NG tube gets removed could switch to po  CAP (community acquired pneumonia) -Started on antibiotics with ceftriaxone and azithromycin, continue.  Respiratory status is stable  Chronic kidney disease, stage 3b ([place])- (present on admission) - Baseline creatinine around 1.5.  Currently at baseline.  On IV fluids, closely monitor renal function  Essential hypertension- (present on admission) - Hold BP meds, blood pressure controlled, on Cardizem infusion  Anemia of chronic disease- (present on admission) -Hemoglobin stable, no  bleeding, continue heparin  Right shoulder pain due to gout - Started on 2/5, patient reports history of gouty attack at various sites of the body, this reminds him of one. Start colchicine, steroids x 1, pain control  History of COVID-19 - Apparently tested positive for COVID 2 weeks ago.  Given lack of hard proof, keep on isolation for now.  Lung infiltrates either represents post-COVID pneumonitis versus bacterial infection.  Currently on antibiotics  Personal history of other malignant neoplasm of kidney - Noted  CAD (coronary artery disease) of artery bypass graft- (present on admission) - No chest pain   Scheduled Meds:  colchicine  0.6 mg Per Tube Daily   feeding supplement  237 mL Oral BID BM   mouth rinse  15 mL Mouth Rinse BID   Continuous Infusions:  sodium chloride 50 mL/hr at 03/04/21 0604   azithromycin 500 mg (03/04/21 0018)   cefTRIAXone (ROCEPHIN)  IV 2 g (03/03/21 2230)   diltiazem (CARDIZEM) infusion 5 mg/hr (03/04/21 0842)   heparin 1,450 Units/hr (03/04/21 0021)   metronidazole 500 mg (03/04/21 0605)   PRN Meds:.HYDROmorphone (DILAUDID) injection, LORazepam, ondansetron (ZOFRAN) IV  Diet Orders (From admission, onward)     Start     Ordered   03/04/21 0903  Diet NPO time specified Except for: Ice Chips, Sips with Meds, Other (See Comments)  Diet effective now       Comments: Small amt water, juice, popsicles from floor ok  Question Answer Comment  Except for Ice Chips   Except for Sips with Meds   Except for Other (See Comments)  03/04/21 0902            DVT prophylaxis: SCDs Start: 03/01/21 2048   Lab Results  Component Value Date   PLT 486 (H) 03/04/2021      Code Status: Full Code  Family Communication: No family at bedside  Status is: Inpatient  Remains inpatient appropriate because: Severity of illness  Level of care: Progressive  Consultants:  General surgery  Procedures:  none  Microbiology   none  Antimicrobials: Ceftriaxone / Vanc / Metronidazole    Objective: Vitals:   03/04/21 0000 03/04/21 0025 03/04/21 0458 03/04/21 1000  BP: (!) 117/57  (!) 122/58 120/67  Pulse: 78  84 99  Resp: 18 20 20  (!) 21  Temp:  99.2 F (37.3 C) 98.6 F (37 C)   TempSrc:  Oral Oral   SpO2: 94%  95% 92%  Weight:      Height:        Intake/Output Summary (Last 24 hours) at 03/04/2021 1114 Last data filed at 03/04/2021 3329 Gross per 24 hour  Intake 1734.55 ml  Output 800 ml  Net 934.55 ml    Wt Readings from Last 3 Encounters:  03/03/21 58.2 kg  01/29/21 52.2 kg  04/17/18 46.7 kg    Examination:  Constitutional: NAD Eyes: lids and conjunctivae normal, no scleral icterus ENMT: mmm Neck: normal, supple Respiratory: clear to auscultation bilaterally, no wheezing, no crackles. Normal respiratory effort.  Cardiovascular: Regular rate and rhythm, no murmurs / rubs / gallops. No LE edema. Abdomen: soft, no distention, no tenderness. Bowel sounds positive.  Skin: no rashes Neurologic: no focal deficits, equal strength.  Unable to move his right arm due to severe pain   Data Reviewed: I have independently reviewed following labs and imaging studies   CBC Recent Labs  Lab 03/01/21 1753 03/02/21 0227 03/03/21 0206 03/04/21 0208  WBC 15.5* 14.3* 13.9* 17.8*  HGB 11.3* 10.1* 9.9* 8.6*  HCT 35.4* 31.8* 30.5* 27.3*  PLT 686* 595* 572* 486*  MCV 88.5 89.3 88.9 90.4  MCH 28.3 28.4 28.9 28.5  MCHC 31.9 31.8 32.5 31.5  RDW 17.1* 17.1* 17.2* 17.2*  LYMPHSABS 1.0  --   --   --   MONOABS 1.1*  --   --   --   EOSABS 0.1  --   --   --   BASOSABS 0.1  --   --   --      Recent Labs  Lab 03/01/21 1753 03/01/21 2053 03/02/21 0226 03/03/21 1147 03/04/21 0208  NA 133*  --  135 138 137  K 3.8  --  4.1 3.8 3.5  CL 97*  --  99 100 104  CO2 24  --  24 22 22   GLUCOSE 118*  --  111* 88 71  BUN 31*  --  27* 24* 26*  CREATININE 1.51*  --  1.38* 1.61* 1.58*  CALCIUM 8.7*  --  8.2*  8.6* 8.0*  AST 20  --  15  --   --   ALT 6  --  7  --   --   ALKPHOS 71  --  61  --   --   BILITOT 0.8  --  0.4  --   --   ALBUMIN 3.1*  --  2.6*  --   --   MG 1.6*  --  2.0  --   --   LATICACIDVEN 2.0* 1.7  --   --   --      ------------------------------------------------------------------------------------------------------------------  No results for input(s): CHOL, HDL, LDLCALC, TRIG, CHOLHDL, LDLDIRECT in the last 72 hours.  No results found for: HGBA1C ------------------------------------------------------------------------------------------------------------------ No results for input(s): TSH, T4TOTAL, T3FREE, THYROIDAB in the last 72 hours.  Invalid input(s): FREET3  Cardiac Enzymes No results for input(s): CKMB, TROPONINI, MYOGLOBIN in the last 168 hours.  Invalid input(s): CK ------------------------------------------------------------------------------------------------------------------ No results found for: BNP  CBG: No results for input(s): GLUCAP in the last 168 hours.  Recent Results (from the past 240 hour(s))  Blood culture (routine x 2)     Status: None (Preliminary result)   Collection Time: 03/01/21  7:00 PM   Specimen: BLOOD RIGHT ARM  Result Value Ref Range Status   Specimen Description BLOOD RIGHT ARM  Final   Special Requests   Final    BOTTLES DRAWN AEROBIC AND ANAEROBIC Blood Culture results may not be optimal due to an excessive volume of blood received in culture bottles   Culture   Final    NO GROWTH 3 DAYS Performed at [facility], [place] [address] [place], [place] [address]    Report Status PENDING  Incomplete  Blood culture (routine x 2)     Status: None (Preliminary result)   Collection Time: 03/01/21  7:05 PM   Specimen: BLOOD LEFT ARM  Result Value Ref Range Status   Specimen Description BLOOD LEFT ARM  Final   Special Requests   Final    BOTTLES DRAWN AEROBIC AND ANAEROBIC Blood Culture adequate volume   Culture   Final     NO GROWTH 3 DAYS Performed at [facility], [place] [address] [place], [place] [address]    Report Status PENDING  Incomplete  Respiratory (~20 pathogens) panel by PCR     Status: None   Collection Time: 03/01/21  8:11 PM   Specimen: Nasopharyngeal Swab; Respiratory  Result Value Ref Range Status   Adenovirus NOT DETECTED NOT DETECTED Final   Coronavirus 229E NOT DETECTED NOT DETECTED Final    Comment: (NOTE) The Coronavirus on the Respiratory Panel, DOES NOT test for the novel  Coronavirus (2019 nCoV)    Coronavirus HKU1 NOT DETECTED NOT DETECTED Final   Coronavirus NL63 NOT DETECTED NOT DETECTED Final   Coronavirus OC43 NOT DETECTED NOT DETECTED Final   Metapneumovirus NOT DETECTED NOT DETECTED Final   Rhinovirus / Enterovirus NOT DETECTED NOT DETECTED Final   Influenza A NOT DETECTED NOT DETECTED Final   Influenza B NOT DETECTED NOT DETECTED Final   Parainfluenza Virus 1 NOT DETECTED NOT DETECTED Final   Parainfluenza Virus 2 NOT DETECTED NOT DETECTED Final   Parainfluenza Virus 3 NOT DETECTED NOT DETECTED Final   Parainfluenza Virus 4 NOT DETECTED NOT DETECTED Final   Respiratory Syncytial Virus NOT DETECTED NOT DETECTED Final   Bordetella pertussis NOT DETECTED NOT DETECTED Final   Bordetella Parapertussis NOT DETECTED NOT DETECTED Final   Chlamydophila pneumoniae NOT DETECTED NOT DETECTED Final   Mycoplasma pneumoniae NOT DETECTED NOT DETECTED Final    Comment: Performed at [facility], [place]. [address], [place], [place] [address]  Resp Panel by RT-PCR (Flu A&B, Covid) Nasopharyngeal Swab     Status: Abnormal   Collection Time: 03/02/21  3:12 AM   Specimen: Nasopharyngeal Swab; Nasopharyngeal(NP) swabs in vial transport medium  Result Value Ref Range Status   SARS Coronavirus 2 by RT PCR POSITIVE (A) NEGATIVE Final    Comment: (NOTE) SARS-CoV-2 target nucleic acids are DETECTED.  The SARS-CoV-2 RNA is generally detectable  in upper respiratory specimens during  the acute phase of infection. Positive results are indicative of the presence of the identified virus, but do not rule out bacterial infection or co-infection with other pathogens not detected by the test. Clinical correlation with patient history and other diagnostic information is necessary to determine patient infection status. The expected result is Negative.  Fact Sheet for Patients: EntrepreneurPulse.com.au  Fact Sheet for Healthcare Providers: IncredibleEmployment.be  This test is not yet approved or cleared by the [place] FDA and  has been authorized for detection and/or diagnosis of SARS-CoV-2 by FDA under an Emergency Use Authorization (EUA).  This EUA will remain in effect (meaning this test can be used) for the duration of  the COVID-19 declaration under Section 564(b)(1) of the A ct, 21 U.S.C. section 360bbb-3(b)(1), unless the authorization is terminated or revoked sooner.     Influenza A by PCR NEGATIVE NEGATIVE Final   Influenza B by PCR NEGATIVE NEGATIVE Final    Comment: (NOTE) The Xpert Xpress SARS-CoV-2/FLU/RSV plus assay is intended as an aid in the diagnosis of influenza from Nasopharyngeal swab specimens and should not be used as a sole basis for treatment. Nasal washings and aspirates are unacceptable for Xpert Xpress SARS-CoV-2/FLU/RSV testing.  Fact Sheet for Patients: EntrepreneurPulse.com.au  Fact Sheet for Healthcare Providers: IncredibleEmployment.be  This test is not yet approved or cleared by the [place] FDA and has been authorized for detection and/or diagnosis of SARS-CoV-2 by FDA under an Emergency Use Authorization (EUA). This EUA will remain in effect (meaning this test can be used) for the duration of the COVID-19 declaration under Section 564(b)(1) of the Act, 21 U.S.C. section 360bbb-3(b)(1), unless the authorization is terminated  or revoked.  Performed at [facility], [place] [address], [place], [place] [address]       Radiology Studies: DG Abd Portable 1V  Result Date: 03/04/2021 CLINICAL DATA:  Small bowel obstruction EXAM: PORTABLE ABDOMEN - 1 VIEW COMPARISON:  03/03/2021 FINDINGS: Nasogastric tube tip antropyloric. Contrast medium in the bottom of the hiatal hernia noted. There is also some focally concentrated contrast medium in the left upper quadrant. There is also some dilute contrast medium currently visible in the transverse and descending colon. Most of the small bowel is relatively gasless. There is a mildly dilated left upper quadrant loop of jejunum measuring 3.1 cm in diameter, previous loops in this vicinity up to 4.2 cm. Dextroconvex thoracolumbar scoliosis noted with evidence of underlying spondylosis and degenerative disc disease. Questionable airspace opacity at the right lung base. IMPRESSION: 1. Left upper quadrant loop of jejunum is mildly dilated at 3.1 cm. Previous loops in this vicinity measured up to 4.2 cm. 2. There is dilute contrast medium in the transverse and descending colon. Small amount of concentrated contrast medium in the expected vicinity of the hiatal hernia, and in the left upper quadrant. Electronically Signed   By: Van Clines M.D.   On: 03/04/2021 08:20   ECHOCARDIOGRAM COMPLETE  Result Date: 03/03/2021    ECHOCARDIOGRAM REPORT   Patient Name:   LAVARIUS DOUGHTEN Date of Exam: 03/03/2021 Medical Rec #:  [id number]         Height:       64.0 in Accession #:    [id number]        Weight:       128.3 lb Date of Birth:  25-May-1930          BSA:  1.620 m Patient Age:    13 years          BP:           129/63 mmHg Patient Gender: M                 HR:           77 bpm. Exam Location:  Inpatient Procedure: 2D Echo, Cardiac Doppler and Color Doppler Indications:    Atrial Fibrillation  History:        Patient has prior history of Echocardiogram examinations, most                  recent 01/28/2013. CAD, Prior CABG, Arrythmias:Atrial                 Fibrillation; Risk Factors:Hypertension.  Sonographer:    Wenda Low Referring Phys: [place] Comments: [place]  1. Global moderate hypokinesis of the LV. Akinetic anteroseptal/inferoseptal wall from base to the apex. Consistent with prior CABG. Left ventricular ejection fraction, by estimation, is 35 to 40%. The left ventricle has moderately decreased function. The left ventricle demonstrates global hypokinesis. Left ventricular diastolic parameters are consistent with Grade I diastolic dysfunction (impaired relaxation).  2. IVC not well visualized to estimate RVSP. Right ventricular systolic function is normal. The right ventricular size is normal.  3. Left atrial size was severely dilated.  4. Right atrial size was moderately dilated.  5. Mild mitral valve regurgitation.  6. The aortic valve was not well visualized. Aortic valve regurgitation is mild to moderate. Aortic valve sclerosis/calcification is present, without any evidence of aortic stenosis.  7. The inferior vena cava IVC not well visualized. Conclusion(s)/Recommendation(s): No prior imagees to compare. FINDINGS  Left Ventricle: Global moderate hypokinesis of the LV. Akinetic anteroseptal/inferoseptal wall from base to the apex. Consistent with prior CABG. Left ventricular ejection fraction, by estimation, is 35 to 40%. The left ventricle has moderately decreased function. The left ventricle demonstrates global hypokinesis. The left ventricular internal cavity size was normal in size. There is no left ventricular hypertrophy. Abnormal (paradoxical) septal motion consistent with post-operative status. Left ventricular diastolic parameters are consistent with Grade I diastolic dysfunction (impaired relaxation). Right Ventricle: IVC not well visualized to estimate RVSP. The right ventricular size is normal. No increase in right ventricular wall  thickness. Right ventricular systolic function is normal. Left Atrium: Left atrial size was severely dilated. Right Atrium: Right atrial size was moderately dilated. Pericardium: There is no evidence of pericardial effusion. Mitral Valve: There is moderate calcification of the mitral valve leaflet(s). Mild mitral valve regurgitation. MV peak gradient, 5.3 mmHg. The mean mitral valve gradient is 2.0 mmHg. Tricuspid Valve: The tricuspid valve is grossly normal. Tricuspid valve regurgitation is mild. Aortic Valve: The aortic valve was not well visualized. Aortic valve regurgitation is mild to moderate. Aortic regurgitation PHT measures 263 msec. Aortic valve sclerosis/calcification is present, without any evidence of aortic stenosis. Aortic valve mean gradient measures 7.0 mmHg. Aortic valve peak gradient measures 13.5 mmHg. Aortic valve area, by VTI measures 1.99 cm. Pulmonic Valve: The pulmonic valve was normal in structure. Pulmonic valve regurgitation is not visualized. Aorta: The aortic root is normal in size and structure. Venous: The inferior vena cava IVC not well visualized. IAS/Shunts: No atrial level shunt detected by color flow Doppler.  LEFT VENTRICLE PLAX 2D LVIDd:         5.10 cm      Diastology LVIDs:  4.20 cm      LV e' medial:    6.53 cm/s LV PW:         0.80 cm      LV E/e' medial:  15.0 LV IVS:        0.80 cm      LV e' lateral:   10.60 cm/s LVOT diam:     1.90 cm      LV E/e' lateral: 9.3 LV SV:         74 LV SV Index:   46 LVOT Area:     2.84 cm  LV Volumes (MOD) LV vol d, MOD A2C: 108.0 ml LV vol d, MOD A4C: 89.6 ml LV vol s, MOD A2C: 39.9 ml LV vol s, MOD A4C: 54.1 ml LV SV MOD A2C:     68.1 ml LV SV MOD A4C:     89.6 ml LV SV MOD BP:      52.2 ml RIGHT VENTRICLE RV Basal diam:  4.20 cm RV Mid diam:    2.80 cm RV S prime:     12.70 cm/s TAPSE (M-mode): 2.2 cm LEFT ATRIUM              Index        RIGHT ATRIUM           Index LA diam:        4.70 cm  2.90 cm/m   RA Area:     18.30 cm  LA Vol (A2C):   85.8 ml  52.96 ml/m  RA Volume:   48.30 ml  29.81 ml/m LA Vol (A4C):   109.0 ml 67.28 ml/m LA Biplane Vol: 99.9 ml  61.67 ml/m  AORTIC VALVE                     PULMONIC VALVE AV Area (Vmax):    1.91 cm      PV Vmax:       1.20 m/s AV Area (Vmean):   1.74 cm      PV Peak grad:  5.8 mmHg AV Area (VTI):     1.99 cm AV Vmax:           184.00 cm/s AV Vmean:          121.000 cm/s AV VTI:            0.373 m AV Peak Grad:      13.5 mmHg AV Mean Grad:      7.0 mmHg LVOT Vmax:         124.00 cm/s LVOT Vmean:        74.100 cm/s LVOT VTI:          0.262 m LVOT/AV VTI ratio: 0.70 AI PHT:            263 msec  AORTA Ao Root diam: 3.20 cm MITRAL VALVE                TRICUSPID VALVE MV Area (PHT): 4.71 cm     TR Peak grad:   38.7 mmHg MV Area VTI:   3.01 cm     TR Vmax:        311.00 cm/s MV Peak grad:  5.3 mmHg MV Mean grad:  2.0 mmHg     SHUNTS MV Vmax:       1.15 m/s     Systemic VTI:  0.26 m MV Vmean:      68.5 cm/s    Systemic Diam: 1.90 cm  MV Decel Time: 161 msec MV E velocity: 98.10 cm/s MV A velocity: 105.00 cm/s MV E/A ratio:  0.93 Landscape architect signed by Phineas Inches Signature Date/Time: 03/03/2021/3:14:28 PM    Final      Marzetta Board, MD, PhD [facility]  Between 7 am - 7 pm I am available, please contact me via Amion (for emergencies) or Securechat (non urgent messages)  Between 7 pm - 7 am I am not available, please contact night coverage MD/APP via Amion

## 2021-03-04 NOTE — Progress Notes (Signed)
ANTICOAGULATION CONSULT NOTE   Pharmacy Consult for IV heparin   Indication: atrial fibrillation  Patient Measurements: Height: 5\' 4"  (162.6 cm) Weight: 58.2 kg (128 lb 4.9 oz) IBW/kg (Calculated) : 59.2 Heparin Dosing Weight: 52.2 kg   Vital Signs: Temp: 99.2 F (37.3 C) (02/06 0025) Temp Source: Oral (02/06 0025) BP: 117/57 (02/06 0000) Pulse Rate: 78 (02/06 0000)  Labs: Recent Labs    03/02/21 0226 03/02/21 0227 03/02/21 0805 03/03/21 0206 03/03/21 1147 03/03/21 2006 03/04/21 0208  HGB  --  10.1*  --  9.9*  --   --  8.6*  HCT  --  31.8*  --  30.5*  --   --  27.3*  PLT  --  595*  --  572*  --   --  486*  HEPARINUNFRC  --   --    < > 0.23* 0.16* 0.12* 0.45  CREATININE 1.38*  --   --   --  1.61*  --  1.58*   < > = values in this interval not displayed.     Estimated Creatinine Clearance: 26.1 mL/min (A) (by C-G formula based on SCr of 1.58 mg/dL (H)).  Assessment: 88 yo M presents on 2/3 with small bowel obstruction. PMH significant for Afib. No PTA AC. CHADs2-VASC 4. Pharmacy consulted to start IV heparin.   Heparin level now therapeutic     Goal of Therapy:  Heparin level 0.3-0.7 units/ml Monitor platelets by anticoagulation protocol: Yes   Plan:  Continue heparin to 1450 units/hr  Confirm heparin therapeutic with level at 11 am  Daily heparin level, CBC  Thank you Anette Guarneri, PharmD   Please check AMION for all [place] phone numbers After 10:00 PM, call [place]

## 2021-03-04 NOTE — Progress Notes (Signed)
Physical Therapy Treatment Patient Details Name: Angel Cooper MRN: [id number] DOB: Mar 05, 1930 Today's Date: 03/04/2021   History of Present Illness 88 y/o male admitted 03/01/21 for Abd pain and constipation x 3 days. Recently seen PCP 2 weeks ago after diagnosed with COVID and CXR showed PNA. Pt with SBO, Afib with RVR, and PNA. PMH: CKD, Afib, hx of renal resection due to RCC, HTN    PT Comments    Pt pleasant and very wiling to attempt mobility Pt reports RUE very painful with what pt reports as gout. Pt lives with wife and normally uses rollator. Currently max assist for basic transfers and unable to stand or pivot OOB. Pt with significant pain RUE also limiting mobility and pt reports wife is only assist at home and cannot physically assist at his present state. Recommend SNf as wife cannot assist pt at max assist level.   Pt on 3L throughout with sats fluctuating 88-92% with cues for breathing technique    Recommendations for follow up therapy are one component of a multi-disciplinary discharge planning process, led by the attending physician.  Recommendations may be updated based on patient status, additional functional criteria and insurance authorization.  Follow Up Recommendations  Skilled nursing-short term rehab (<3 hours/day)     Assistance Recommended at Discharge Frequent or constant Supervision/Assistance  Patient can return home with the following Two people to help with walking and/or transfers;A lot of help with bathing/dressing/bathroom;Assistance with cooking/housework;Assist for transportation;Help with stairs or ramp for entrance   Equipment Recommendations  Wheelchair (measurements PT);Wheelchair cushion (measurements PT);Hospital bed    Recommendations for Other Services       Precautions / Restrictions Precautions Precautions: Fall;Other (comment) Precaution Comments: NG tube, R elbow/handpain- gout, watch sats     Mobility  Bed Mobility Overal bed  mobility: Needs Assistance Bed Mobility: Supine to Sit, Sit to Supine     Supine to sit: Min assist, HOB elevated     General bed mobility comments: HOB 25 degrees with min assist of LUE to elevate trunk to sitting, mod assist to scoot to EOB. Return to supine with max assist with total assist to slide toward [facility]    Transfers Overall transfer level: Needs assistance   Transfers: Sit to/from Stand             General transfer comment: max assist with pad under sacrum and bil knees blocked to rise from surface. PT unable to use RUE to due to pain and maintains flexed posture in standing x 2 attempts    Ambulation/Gait               General Gait Details: unable   Stairs             Wheelchair Mobility    Modified Rankin (Stroke Patients Only)       Balance Overall balance assessment: Needs assistance Sitting-balance support: No upper extremity supported, Feet supported Sitting balance-Leahy Scale: Fair Sitting balance - Comments: static sitting once EOB with guarding   Standing balance support: Single extremity supported Standing balance-Leahy Scale: Zero Standing balance comment: use of pad, bil knees blocked and max assist to maintain standing                            Cognition Arousal/Alertness: Awake/alert Behavior During Therapy: Flat affect Overall Cognitive Status: Impaired/Different from baseline Area of Impairment: Orientation, Safety/judgement, Problem solving  Orientation Level: Disoriented to, Time       Safety/Judgement: Decreased awareness of deficits, Decreased awareness of safety   Problem Solving: Slow processing          Exercises      General Comments        Pertinent Vitals/Pain Pain Assessment Faces Pain Scale: Hurts whole lot Pain Location: R hand/elbow Pain Descriptors / Indicators: Aching, Discomfort, Grimacing, Tender Pain Intervention(s): Limited activity within patient's  tolerance, Monitored during session, Premedicated before session, Repositioned    Home Living                          Prior Function            PT Goals (current goals can now be found in the care plan section) Progress towards PT goals: Not progressing toward goals - comment    Frequency    Min 3X/week      PT Plan Discharge plan needs to be updated    Co-evaluation              AM-PAC PT "6 Clicks" Mobility   Outcome Measure  Help needed turning from your back to your side while in a flat bed without using bedrails?: A Lot Help needed moving from lying on your back to sitting on the side of a flat bed without using bedrails?: A Lot Help needed moving to and from a bed to a chair (including a wheelchair)?: Total Help needed standing up from a chair using your arms (e.g., wheelchair or bedside chair)?: Total Help needed to walk in hospital room?: Total Help needed climbing 3-5 steps with a railing? : Total 6 Click Score: 8    End of Session Equipment Utilized During Treatment: Oxygen Activity Tolerance: Patient tolerated treatment well Patient left: in bed;with call bell/phone within reach;with bed alarm set;with nursing/sitter in room Nurse Communication: Mobility status PT Visit Diagnosis: Unsteadiness on feet (R26.81);Muscle weakness (generalized) (M62.81);Difficulty in walking, not elsewhere classified (R26.2)     Time: 1941-7408 PT Time Calculation (min) (ACUTE ONLY): 27 min  Charges:  $Therapeutic Activity: 23-37 mins                     Sheral Pfahler P, PT Acute Rehabilitation Services Pager: [phone] Office: [place] 03/04/2021, 1:21 PM

## 2021-03-04 NOTE — NC FL2 (Signed)
[place] LEVEL OF CARE SCREENING TOOL     IDENTIFICATION  Patient Name: Angel Cooper Birthdate: Mar 28, 1930 Sex: male Admission Date (Current Location): 03/01/2021  [facility] and [place] Number:  Herbalist and Address:  [facility], [place] [address] [place], [place] [address]      Provider Number: [id number]  Attending Physician Name and Address:  Caren Griffins, MD  Relative Name and Phone Number:       Current Level of Care: Hospital Recommended Level of Care: [place] Prior Approval Number:    Date Approved/Denied:   PASRR Number: [id number]  Discharge Plan: SNF    Current Diagnoses: Patient Active Problem List   Diagnosis Date Noted   Right shoulder pain due to gout 03/03/2021   History of COVID-19 03/02/2021   Partial small bowel obstruction ([place]) 03/01/2021   Personal history of other malignant neoplasm of kidney 03/01/2021   CAP (community acquired pneumonia) 03/01/2021   Knee pain, bilateral 01/29/2021   Unable to walk 01/29/2021   Gouty arthritis 12/07/2018   Essential hypertension 12/07/2018   Anemia of chronic disease 12/07/2018   Atrial fibrillation with RVR ([place]) 01/28/2013   CAD (coronary artery disease) of artery bypass graft 01/28/2013   Chronic kidney disease, stage 3b ([place]) 01/28/2013   Ureteral cancer ([place]) 01/24/2013    Orientation RESPIRATION BLADDER Height & Weight     Self, Time, Situation, Place  O2 (3L nasal cannula) Incontinent, External catheter Weight: 128 lb 4.9 oz (58.2 kg) Height:  5\' 4"  (162.6 cm)  BEHAVIORAL SYMPTOMS/MOOD NEUROLOGICAL BOWEL NUTRITION STATUS      Continent Diet (see dc summary)  AMBULATORY STATUS COMMUNICATION OF NEEDS Skin   Extensive Assist Verbally Normal                       Personal Care Assistance Level of Assistance  Bathing, Feeding, Dressing Bathing Assistance: Maximum assistance Feeding assistance: Limited  assistance Dressing Assistance: Limited assistance     Functional Limitations Info             SPECIAL CARE FACTORS FREQUENCY  PT (By licensed PT), OT (By licensed OT)     PT Frequency: 5x/week OT Frequency: 5x/week            Contractures Contractures Info: Not present    Additional Factors Info  Code Status, Allergies, Isolation Precautions Code Status Info: Full Allergies Info: Aspirin, Chlorhexidine, Enalapril, Gabapentin, Statins     Isolation Precautions Info: COVID + 03/01/21 (though pt says tested + 2 weeks ago at outpatient clinic)     Current Medications (03/04/2021):  This is the current hospital active medication list Current Facility-Administered Medications  Medication Dose Route Frequency Provider Last Rate Last Admin   0.9 %  sodium chloride infusion   Intravenous Continuous Caren Griffins, MD 50 mL/hr at 03/04/21 0604 New Bag at 03/04/21 0604   azithromycin (ZITHROMAX) 500 mg in sodium chloride 0.9 % 250 mL IVPB  500 mg Intravenous Q24H Kristopher Oppenheim, DO 250 mL/hr at 03/04/21 0018 500 mg at 03/04/21 0018   cefTRIAXone (ROCEPHIN) 2 g in sodium chloride 0.9 % 100 mL IVPB  2 g Intravenous Q24H Kristopher Oppenheim, DO 200 mL/hr at 03/03/21 2230 2 g at 03/03/21 2230   colchicine tablet 0.6 mg  0.6 mg Per Tube Daily Caren Griffins, MD   0.6 mg at 03/04/21 0842   diltiazem (CARDIZEM) 125 mg in dextrose 5% 125  mL (1 mg/mL) infusion  5-15 mg/hr Intravenous Titrated Kristopher Oppenheim, DO 5 mL/hr at 03/04/21 0842 5 mg/hr at 03/04/21 0842   feeding supplement (ENSURE ENLIVE / ENSURE PLUS) liquid 237 mL  237 mL Oral BID BM Kristopher Oppenheim, DO       heparin ADULT infusion 100 units/mL (25000 units/275mL)  1,450 Units/hr Intravenous Continuous Donnamae Jude, RPH 14.5 mL/hr at 03/04/21 1251 1,450 Units/hr at 03/04/21 1251   HYDROmorphone (DILAUDID) injection 0.5 mg  0.5 mg Intravenous Q3H PRN Caren Griffins, MD   0.5 mg at 03/04/21 0955   LORazepam (ATIVAN) injection 0.5 mg  0.5 mg  Intravenous QHS PRN Kristopher Oppenheim, DO       MEDLINE mouth rinse  15 mL Mouth Rinse BID Caren Griffins, MD   15 mL at 03/04/21 0856   metroNIDAZOLE (FLAGYL) IVPB 500 mg  500 mg Intravenous Q12H Kristopher Oppenheim, DO 100 mL/hr at 03/04/21 0605 500 mg at 03/04/21 0605   ondansetron (ZOFRAN) injection 4 mg  4 mg Intravenous Q6H PRN Kristopher Oppenheim, DO   4 mg at 03/02/21 0139     Discharge Medications: Please see discharge summary for a list of discharge medications.  Relevant Imaging Results:  Relevant Lab Results:   Additional Information SSN: [id number]. Moderna vaccines 03/26/19, 04/23/19, 11/29/19  Benard Halsted, LCSW

## 2021-03-04 NOTE — Evaluation (Signed)
Occupational Therapy Evaluation Patient Details Name: Angel Cooper MRN: [id number] DOB: October 12, 1930 Today's Date: 03/04/2021   History of Present Illness 88 y/o male admitted 03/01/21 for Abd pain and constipation x 3 days. Recently seen PCP 2 weeks ago after diagnosed with COVID and CXR showed PNA. Pt with SBO, Afib with RVR, and PNA. PMH: CKD, Afib, hx of renal resection due to RCC, HTN   Clinical Impression   Pt presents with decline in function and safety with ADLs and ADL mobility with impaired strength, balance, endurance and pain in R UE limiting AROM and UE functional use (pt reports gout in R elbow/forearm). PTA pt lived at home with his wife, was Ind with ADLs/selfcare and used a Marketing executive for mobility. Pt and family instructed on deep, pursed lip breathing to increase O2 SATs >88% as pt desaturating to 86-89% on 3L O2, HR ncreasing to 132 with activity from 112-114 at rest     Recommendations for follow up therapy are one component of a multi-disciplinary discharge planning process, led by the attending physician.  Recommendations may be updated based on patient status, additional functional criteria and insurance authorization.   Follow Up Recommendations  Skilled nursing-short term rehab (<3 hours/day)    Assistance Recommended at Discharge Frequent or constant Supervision/Assistance  Patient can return home with the following A lot of help with bathing/dressing/bathroom;Two people to help with walking and/or transfers;Assist for transportation;Direct supervision/assist for medications management;Help with stairs or ramp for entrance    Functional Status Assessment  Patient has had a recent decline in their functional status and demonstrates the ability to make significant improvements in function in a reasonable and predictable amount of time.  Equipment Recommendations   (TBD at SNF)    Recommendations for Other Services       Precautions / Restrictions  Precautions Precautions: Fall;Other (comment) Precaution Comments: NG tube, R elbow/handpain- gout, watch sats Restrictions Weight Bearing Restrictions: No      Mobility Bed Mobility Overal bed mobility: Needs Assistance Bed Mobility: Supine to Sit, Sit to Supine     Supine to sit: Min assist, HOB elevated Sit to supine: Min assist   General bed mobility comments: increased time and effort, min A to elevate trunk    Transfers Overall transfer level: Needs assistance   Transfers: Sit to/from Stand Sit to Stand: Max assist           General transfer comment: max A x 2 attempts from EOB, Pt unable to use R UE to push off due to pain      Balance Overall balance assessment: Needs assistance Sitting-balance support: No upper extremity supported, Feet supported Sitting balance-Leahy Scale: Fair     Standing balance support: Single extremity supported Standing balance-Leahy Scale: Zero                             ADL either performed or assessed with clinical judgement   ADL Overall ADL's : Needs assistance/impaired Eating/Feeding: Set up;Supervision/ safety;Sitting;Bed level   Grooming: Wash/dry hands;Wash/dry face;Oral care;Min guard;Sitting   Upper Body Bathing: Moderate assistance   Lower Body Bathing: Maximal assistance   Upper Body Dressing : Moderate assistance   Lower Body Dressing: Total assistance     Toilet Transfer Details (indicate cue type and reason): unable, max A x 2 attempts for standing from EOB Toileting- Clothing Manipulation and Hygiene: Total assistance;Bed level         General ADL Comments: pt  limted by R UE pain, generalized weakness and Poor activity tolerance     Vision Baseline Vision/History: 1 Wears glasses Ability to See in Adequate Light: 0 Adequate Patient Visual Report: No change from baseline       Perception     Praxis      Pertinent Vitals/Pain Pain Assessment Faces Pain Scale: Hurts whole  lot Pain Location: R hand/elbow Pain Descriptors / Indicators: Aching, Discomfort, Grimacing, Tender Pain Intervention(s): Monitored during session, Limited activity within patient's tolerance, Repositioned, Premedicated before session     Hand Dominance Right   Extremity/Trunk Assessment Upper Extremity Assessment Upper Extremity Assessment: Generalized weakness RUE: Unable to fully assess due to pain   Lower Extremity Assessment Lower Extremity Assessment: Defer to PT evaluation   Cervical / Trunk Assessment Cervical / Trunk Assessment: Kyphotic   Communication Communication Communication: No difficulties   Cognition Arousal/Alertness: Awake/alert Behavior During Therapy: WFL for tasks assessed/performed Overall Cognitive Status: Impaired/Different from baseline Area of Impairment: Orientation, Safety/judgement, Problem solving                 Orientation Level: Disoriented to, Time       Safety/Judgement: Decreased awareness of deficits, Decreased awareness of safety   Problem Solving: Slow processing, Decreased initiation, Requires verbal cues       General Comments       Exercises Other Exercises Other Exercises: Gentle PROM to R UE Other Exercises: Pt and family instructed on deep, pursed lip breathing to increase O2 SATs >88% as pt desaturating to 86-89% on 3L O2, HR ncreasing to 132 with activity from 112-114 at rest   Shoulder Instructions      Home Living Family/patient expects to be discharged to:: Private residence Living Arrangements: Spouse/significant other Available Help at Discharge: Family Type of Home: House Home Access: Stairs to enter;Ramped entrance Entrance Stairs-Number of Steps: 2-3 Entrance Stairs-Rails: Right Home Layout: One level     Bathroom Shower/Tub: Tub/shower unit;Walk-in shower   Bathroom Toilet: Standard     Home Equipment: Conservation officer, nature (2 wheels);Rollator (4 wheels);Shower seat;Cane - single point           Prior Functioning/Environment Prior Level of Function : Independent/Modified Independent             Mobility Comments: Uses 3 wheeled Rollator RW for walking ADLs Comments: was Ind with bathing (at sink), dressing, grooming and toileting        OT Problem List: Decreased strength;Impaired balance (sitting and/or standing);Decreased cognition;Pain;Decreased safety awareness;Decreased activity tolerance;Decreased coordination;Decreased knowledge of use of DME or AE;Impaired UE functional use      OT Treatment/Interventions: Self-care/ADL training;Visual/perceptual remediation/compensation;Patient/family education;Balance training;Therapeutic activities;DME and/or AE instruction    OT Goals(Current goals can be found in the care plan section) Acute Rehab OT Goals Patient Stated Goal: go home OT Goal Formulation: With patient/family Time For Goal Achievement: 03/18/21 Potential to Achieve Goals: Good ADL Goals Pt Will Perform Grooming: with supervision;with set-up;sitting Pt Will Perform Upper Body Bathing: with min assist;with min guard assist;sitting Pt Will Perform Lower Body Bathing: with mod assist;sitting/lateral leans Pt Will Perform Upper Body Dressing: with min assist;with min guard assist;sitting Additional ADL Goal #1: Pt will tolerate gentle, graded P/AAROM of R UE  OT Frequency: Min 2X/week    Co-evaluation              AM-PAC OT "6 Clicks" Daily Activity     Outcome Measure Help from another person eating meals?: A Little Help from another person taking care of  personal grooming?: A Little Help from another person toileting, which includes using toliet, bedpan, or urinal?: Total Help from another person bathing (including washing, rinsing, drying)?: A Lot Help from another person to put on and taking off regular upper body clothing?: A Lot Help from another person to put on and taking off regular lower body clothing?: Total 6 Click Score: 12   End of  Session Equipment Utilized During Treatment: Gait belt  Activity Tolerance: Patient limited by fatigue;Patient limited by pain Patient left: in bed;with call bell/phone within reach;with chair alarm set;with family/visitor present  OT Visit Diagnosis: Unsteadiness on feet (R26.81);Other abnormalities of gait and mobility (R26.89);Muscle weakness (generalized) (M62.81);Pain;Other symptoms and signs involving cognitive function Pain - Right/Left: Right Pain - part of body: Arm                Time: 8676-7209 OT Time Calculation (min): 33 min Charges:  OT General Charges $OT Visit: 1 Visit OT Evaluation $OT Eval Moderate Complexity: 1 Mod OT Treatments $Self Care/Home Management : 8-22 mins    Emmit Alexanders [facility] 03/04/2021, 4:04 PM

## 2021-03-04 NOTE — Progress Notes (Signed)
ANTICOAGULATION CONSULT NOTE   Pharmacy Consult for IV heparin   Indication: atrial fibrillation  Patient Measurements: Height: 5\' 4"  (162.6 cm) Weight: 58.2 kg (128 lb 4.9 oz) IBW/kg (Calculated) : 59.2 Heparin Dosing Weight: 52.2 kg   Vital Signs: Temp: 99.3 F (37.4 C) (02/06 1139) Temp Source: Oral (02/06 1138) BP: 121/65 (02/06 1139) Pulse Rate: 116 (02/06 1139)  Labs: Recent Labs    03/02/21 0226 03/02/21 0227 03/02/21 0805 03/03/21 0206 03/03/21 1147 03/03/21 2006 03/04/21 0208 03/04/21 1335  HGB  --  10.1*  --  9.9*  --   --  8.6*  --   HCT  --  31.8*  --  30.5*  --   --  27.3*  --   PLT  --  595*  --  572*  --   --  486*  --   APTT  --   --   --   --   --   --  103*  --   HEPARINUNFRC  --   --    < > 0.23* 0.16* 0.12* 0.45 0.56  CREATININE 1.38*  --   --   --  1.61*  --  1.58*  --    < > = values in this interval not displayed.     Estimated Creatinine Clearance: 26.1 mL/min (A) (by C-G formula based on SCr of 1.58 mg/dL (H)).  Assessment: 88 yo M presents on 2/3 with small bowel obstruction. PMH significant for Afib. No PTA AC. CHADs2-VASC 4. Pharmacy consulted to start IV heparin.   Heparin level therapeutic (0.56) on infusion at 1450 units/hr. Hgb trending down to 8.6, no overt bleeding noted.  TOC benefits check for Eliquis 2.5mg  po BID done and copay will be $45 per month.   Goal of Therapy:  Heparin level 0.3-0.7 units/ml Monitor platelets by anticoagulation protocol: Yes   Plan:  Continue heparin at 1450 units/hr  Daily heparin level and CBC F/u ability to change to oral therapy  Sherlon Handing, PharmD, BCPS Please see amion for complete clinical pharmacist phone list 03/04/2021 2:44 PM

## 2021-03-04 NOTE — TOC Benefit Eligibility Note (Addendum)
Patient Teacher, English as a foreign language completed.    The patient is currently admitted and upon discharge could be taking Eliquis 5 mg.  The current 30 day co-pay is, $45.00.   The patient is currently admitted and upon discharge could be taking Eliquis 2.5 mg.  The current 30 day co-pay is, $45.00.  The patient is insured through [place] Patient Advocate Specialist [place] Patient Advocate Team Direct Number: [phone]  Fax: [phone]

## 2021-03-04 NOTE — Progress Notes (Signed)
Initial Nutrition Assessment  DOCUMENTATION CODES:   Severe malnutrition in context of chronic illness  INTERVENTION:   Ensure Enlive po TID, each supplement provides 350 kcal and 20 grams of protein. Renal Multivitamin w/ minerals daily  NUTRITION DIAGNOSIS:   Severe Malnutrition related to chronic illness (CHF) as evidenced by severe muscle depletion, severe fat depletion.  GOAL:   Patient will meet greater than or equal to 90% of their needs  MONITOR:   Diet advancement, Labs, Weight trends, PO intake, Supplement acceptance  REASON FOR ASSESSMENT:   Malnutrition Screening Tool    ASSESSMENT:   88 y.o. male presented to the ED with abdominal pain. PMH includes CAD, CHF, GERD, CKD IIIB, HTN, and ureteral cancer. Pt admitted with a partial small bowel obstruction and pneumonia.   2/04 - NGT placed to LIWS 2/06 - NGT clamped; diet advanced to Full Liquids  Pt reports that his appetite was good at home, eating things such as eggs, bacon, sausage, pot roast, and vegetables. Pt reports that he stopped eating in September due to having issues with his bowels and becoming constipated. Pt reports that he was drinking Ensure/Boost at home recently due to his wife needing them.   Pt reports that his UBW was 158-160#, last weighing that in September. Pt reports that hs now weights 116#, related to decrease PO intake. No weights within the past year to assess weight loss. Pt reports that he uses a Rolator walker at home.   Pt was NPO at time of RD visit, unable to discuss ONS. RD to order ONS for pt and follow-up.  Medications reviewed and include: IV antibiotics  Labs reviewed: BUN 26, Creatinine 1.58   NUTRITION - FOCUSED PHYSICAL EXAM:  Flowsheet Row Most Recent Value  Orbital Region Severe depletion  Upper Arm Region Severe depletion  Thoracic and Lumbar Region Severe depletion  Buccal Region Severe depletion  Temple Region Severe depletion  Clavicle Bone Region Severe  depletion  Clavicle and Acromion Bone Region Severe depletion  Scapular Bone Region Severe depletion  Dorsal Hand Severe depletion  Patellar Region Severe depletion  Anterior Thigh Region Severe depletion  Posterior Calf Region Severe depletion  Edema (RD Assessment) None  Hair Reviewed  Eyes Reviewed  Mouth Reviewed  Skin Reviewed  Nails Reviewed       Diet Order:   Diet Order             Diet full liquid Room service appropriate? Yes; Fluid consistency: Thin  Diet effective 1400                   EDUCATION NEEDS:   No education needs have been identified at this time  Skin:  Skin Assessment: Reviewed RN Assessment  Last BM:  2/3  Height:   Ht Readings from Last 1 Encounters:  03/01/21 5\' 4"  (1.626 m)    Weight:   Wt Readings from Last 1 Encounters:  03/03/21 58.2 kg    Ideal Body Weight:  59.1 kg  BMI:  Body mass index is 22.02 kg/m.  Estimated Nutritional Needs:   Kcal:  1800-2000  Protein:  90-105 grams  Fluid:  >/= 1.8 L    Angel Cooper, RD, LDN Clinical Dietitian See [facility] for contact information.

## 2021-03-04 NOTE — Progress Notes (Signed)
[facility] Progress Note     Subjective: CC:  Cc R arm pain from gout.  Thinks he was having flatus last night but unsure when he last had flatus. Denies abdominal pain, nausea, distention. Has not had a BM. Son at bedside.   Objective: Vital signs in last 24 hours: Temp:  [98.3 F (36.8 C)-100.3 F (37.9 C)] 98.6 F (37 C) (02/06 0458) Pulse Rate:  [78-97] 84 (02/06 0458) Resp:  [17-20] 20 (02/06 0458) BP: (117-135)/(57-79) 122/58 (02/06 0458) SpO2:  [90 %-96 %] 95 % (02/06 0458) Last BM Date: 03/01/21  Intake/Output from previous day: 02/05 0701 - 02/06 0700 In: 1734.6 [I.V.:1259.5; IV Piggyback:475] Out: 800 [Urine:500; Emesis/NG output:300] Intake/Output this shift: No intake/output data recorded.  PE: Gen:  Alert, NAD, pleasant, chronically ill appearing, cachectic Card:  rrr Pulm:  Normal effort, clear to auscultation bilaterally Abd: Soft, non-tender, non-distended, no HSM, no peritonitis; NG not working properly during my exam - flushed by me with a small amount thick, clear bilious fluid. Then clamped by me.  Skin: warm and dry, no rashes  Psych: A&Ox3   Lab Results:  Recent Labs    03/03/21 0206 03/04/21 0208  WBC 13.9* 17.8*  HGB 9.9* 8.6*  HCT 30.5* 27.3*  PLT 572* 486*   BMET Recent Labs    03/03/21 1147 03/04/21 0208  NA 138 137  K 3.8 3.5  CL 100 104  CO2 22 22  GLUCOSE 88 71  BUN 24* 26*  CREATININE 1.61* 1.58*  CALCIUM 8.6* 8.0*   PT/INR No results for input(s): LABPROT, INR in the last 72 hours. CMP     Component Value Date/Time   NA 137 03/04/2021 0208   NA 139 02/22/2013 1032   K 3.5 03/04/2021 0208   K 5.0 02/22/2013 1032   CL 104 03/04/2021 0208   CO2 22 03/04/2021 0208   CO2 23 02/22/2013 1032   GLUCOSE 71 03/04/2021 0208   GLUCOSE 100 02/22/2013 1032   BUN 26 (H) 03/04/2021 0208   BUN 24.7 02/22/2013 1032   CREATININE 1.58 (H) 03/04/2021 0208   CREATININE 1.5 (H) 02/22/2013 1032   CALCIUM 8.0 (L)  03/04/2021 0208   CALCIUM 9.1 02/22/2013 1032   PROT 5.7 (L) 03/02/2021 0226   PROT 6.9 02/22/2013 1032   ALBUMIN 2.6 (L) 03/02/2021 0226   ALBUMIN 3.6 02/22/2013 1032   AST 15 03/02/2021 0226   AST 11 02/22/2013 1032   ALT 7 03/02/2021 0226   ALT <6 02/22/2013 1032   ALKPHOS 61 03/02/2021 0226   ALKPHOS 75 02/22/2013 1032   BILITOT 0.4 03/02/2021 0226   BILITOT 0.36 02/22/2013 1032   GFRNONAA 42 (L) 03/04/2021 0208   GFRAA 51 (L) 12/08/2018 0508   Lipase     Component Value Date/Time   LIPASE 24 03/01/2021 1753       Studies/Results: DG Abd 1 View  Result Date: 03/03/2021 CLINICAL DATA:  Abdominal pain EXAM: ABDOMEN - 1 VIEW COMPARISON:  03/02/2021 FINDINGS: There is dilation of small-bowel loops measuring up to 3.9 cm in diameter. Stomach is not distended. There is contrast in the right lower chest, possibly within fixed hiatal hernia. Gas is present in colon and rectum. Tip enteric tube is seen in the region of antrum of the stomach. Evaluation for pneumoperitoneum is limited in this supine radiograph. IMPRESSION: There is dilation of small-bowel loops suggesting partial small bowel obstruction. There is contrast within fixed hiatal hernia. Tip of enteric tube is seen in  the region of antrum of the stomach in the right upper quadrant of abdomen. Electronically Signed   By: Elmer Picker M.D.   On: 03/03/2021 11:57   DG Shoulder Right Port  Result Date: 03/03/2021 CLINICAL DATA:  Pain right shoulder EXAM: RIGHT SHOULDER - 1 VIEW COMPARISON:  None. FINDINGS: No fracture or dislocation is seen. There is decreased distance between humeral head and acromion. Degenerative changes are noted with bony spurs in the shoulder and AC joints. Osteopenia is seen in bony structures. There is evidence of previous coronary bypass surgery. Enteric tube is noted traversing the esophagus with its distal portion in the hiatal hernia. IMPRESSION: No recent fracture or dislocation is seen. Decreased  distance between humeral head and acromion may suggest chronic tear of rotator cuff. Degenerative changes are noted in right shoulder and right AC joints with bony spurs. Electronically Signed   By: Elmer Picker M.D.   On: 03/03/2021 11:52   DG Abd Portable 1V  Result Date: 03/04/2021 CLINICAL DATA:  Small bowel obstruction EXAM: PORTABLE ABDOMEN - 1 VIEW COMPARISON:  03/03/2021 FINDINGS: Nasogastric tube tip antropyloric. Contrast medium in the bottom of the hiatal hernia noted. There is also some focally concentrated contrast medium in the left upper quadrant. There is also some dilute contrast medium currently visible in the transverse and descending colon. Most of the small bowel is relatively gasless. There is a mildly dilated left upper quadrant loop of jejunum measuring 3.1 cm in diameter, previous loops in this vicinity up to 4.2 cm. Dextroconvex thoracolumbar scoliosis noted with evidence of underlying spondylosis and degenerative disc disease. Questionable airspace opacity at the right lung base. IMPRESSION: 1. Left upper quadrant loop of jejunum is mildly dilated at 3.1 cm. Previous loops in this vicinity measured up to 4.2 cm. 2. There is dilute contrast medium in the transverse and descending colon. Small amount of concentrated contrast medium in the expected vicinity of the hiatal hernia, and in the left upper quadrant. Electronically Signed   By: Van Clines M.D.   On: 03/04/2021 08:20   DG Abd Portable 1V-Small Bowel Obstruction Protocol-initial, 8 hr delay  Result Date: 03/02/2021 CLINICAL DATA:  Small-bowel obstruction. EXAM: PORTABLE ABDOMEN - 1 VIEW COMPARISON:  Comparison studies from earlier same day. FINDINGS: Current study performed at 6 p.m. Contrast is seen within the stomach. No contrast seen within the more distal small bowel. Distended gas-filled small bowel loops throughout the abdomen and upper pelvis, consistent with a distal small-bowel obstruction. IMPRESSION:  Distal small bowel obstruction. Contrast is seen within the stomach. No contrast passage into the more distal small bowel. Electronically Signed   By: Franki Cabot M.D.   On: 03/02/2021 19:07   ECHOCARDIOGRAM COMPLETE  Result Date: 03/03/2021    ECHOCARDIOGRAM REPORT   Patient Name:   SANKALP FERRELL Date of Exam: 03/03/2021 Medical Rec #:  [id number]         Height:       64.0 in Accession #:    [id number]        Weight:       128.3 lb Date of Birth:  1930-04-15          BSA:          1.620 m Patient Age:    88 years          BP:           129/63 mmHg Patient Gender: M  HR:           77 bpm. Exam Location:  Inpatient Procedure: 2D Echo, Cardiac Doppler and Color Doppler Indications:    Atrial Fibrillation  History:        Patient has prior history of Echocardiogram examinations, most                 recent 01/28/2013. CAD, Prior CABG, Arrythmias:Atrial                 Fibrillation; Risk Factors:Hypertension.  Sonographer:    Wenda Low Referring Phys: [place] Comments: [place]  1. Global moderate hypokinesis of the LV. Akinetic anteroseptal/inferoseptal wall from base to the apex. Consistent with prior CABG. Left ventricular ejection fraction, by estimation, is 35 to 40%. The left ventricle has moderately decreased function. The left ventricle demonstrates global hypokinesis. Left ventricular diastolic parameters are consistent with Grade I diastolic dysfunction (impaired relaxation).  2. IVC not well visualized to estimate RVSP. Right ventricular systolic function is normal. The right ventricular size is normal.  3. Left atrial size was severely dilated.  4. Right atrial size was moderately dilated.  5. Mild mitral valve regurgitation.  6. The aortic valve was not well visualized. Aortic valve regurgitation is mild to moderate. Aortic valve sclerosis/calcification is present, without any evidence of aortic stenosis.  7. The inferior vena cava IVC not well  visualized. Conclusion(s)/Recommendation(s): No prior imagees to compare. FINDINGS  Left Ventricle: Global moderate hypokinesis of the LV. Akinetic anteroseptal/inferoseptal wall from base to the apex. Consistent with prior CABG. Left ventricular ejection fraction, by estimation, is 35 to 40%. The left ventricle has moderately decreased function. The left ventricle demonstrates global hypokinesis. The left ventricular internal cavity size was normal in size. There is no left ventricular hypertrophy. Abnormal (paradoxical) septal motion consistent with post-operative status. Left ventricular diastolic parameters are consistent with Grade I diastolic dysfunction (impaired relaxation). Right Ventricle: IVC not well visualized to estimate RVSP. The right ventricular size is normal. No increase in right ventricular wall thickness. Right ventricular systolic function is normal. Left Atrium: Left atrial size was severely dilated. Right Atrium: Right atrial size was moderately dilated. Pericardium: There is no evidence of pericardial effusion. Mitral Valve: There is moderate calcification of the mitral valve leaflet(s). Mild mitral valve regurgitation. MV peak gradient, 5.3 mmHg. The mean mitral valve gradient is 2.0 mmHg. Tricuspid Valve: The tricuspid valve is grossly normal. Tricuspid valve regurgitation is mild. Aortic Valve: The aortic valve was not well visualized. Aortic valve regurgitation is mild to moderate. Aortic regurgitation PHT measures 263 msec. Aortic valve sclerosis/calcification is present, without any evidence of aortic stenosis. Aortic valve mean gradient measures 7.0 mmHg. Aortic valve peak gradient measures 13.5 mmHg. Aortic valve area, by VTI measures 1.99 cm. Pulmonic Valve: The pulmonic valve was normal in structure. Pulmonic valve regurgitation is not visualized. Aorta: The aortic root is normal in size and structure. Venous: The inferior vena cava IVC not well visualized. IAS/Shunts: No atrial  level shunt detected by color flow Doppler.  LEFT VENTRICLE PLAX 2D LVIDd:         5.10 cm      Diastology LVIDs:         4.20 cm      LV e' medial:    6.53 cm/s LV PW:         0.80 cm      LV E/e' medial:  15.0 LV IVS:  0.80 cm      LV e' lateral:   10.60 cm/s LVOT diam:     1.90 cm      LV E/e' lateral: 9.3 LV SV:         74 LV SV Index:   46 LVOT Area:     2.84 cm  LV Volumes (MOD) LV vol d, MOD A2C: 108.0 ml LV vol d, MOD A4C: 89.6 ml LV vol s, MOD A2C: 39.9 ml LV vol s, MOD A4C: 54.1 ml LV SV MOD A2C:     68.1 ml LV SV MOD A4C:     89.6 ml LV SV MOD BP:      52.2 ml RIGHT VENTRICLE RV Basal diam:  4.20 cm RV Mid diam:    2.80 cm RV S prime:     12.70 cm/s TAPSE (M-mode): 2.2 cm LEFT ATRIUM              Index        RIGHT ATRIUM           Index LA diam:        4.70 cm  2.90 cm/m   RA Area:     18.30 cm LA Vol (A2C):   85.8 ml  52.96 ml/m  RA Volume:   48.30 ml  29.81 ml/m LA Vol (A4C):   109.0 ml 67.28 ml/m LA Biplane Vol: 99.9 ml  61.67 ml/m  AORTIC VALVE                     PULMONIC VALVE AV Area (Vmax):    1.91 cm      PV Vmax:       1.20 m/s AV Area (Vmean):   1.74 cm      PV Peak grad:  5.8 mmHg AV Area (VTI):     1.99 cm AV Vmax:           184.00 cm/s AV Vmean:          121.000 cm/s AV VTI:            0.373 m AV Peak Grad:      13.5 mmHg AV Mean Grad:      7.0 mmHg LVOT Vmax:         124.00 cm/s LVOT Vmean:        74.100 cm/s LVOT VTI:          0.262 m LVOT/AV VTI ratio: 0.70 AI PHT:            263 msec  AORTA Ao Root diam: 3.20 cm MITRAL VALVE                TRICUSPID VALVE MV Area (PHT): 4.71 cm     TR Peak grad:   38.7 mmHg MV Area VTI:   3.01 cm     TR Vmax:        311.00 cm/s MV Peak grad:  5.3 mmHg MV Mean grad:  2.0 mmHg     SHUNTS MV Vmax:       1.15 m/s     Systemic VTI:  0.26 m MV Vmean:      68.5 cm/s    Systemic Diam: 1.90 cm MV Decel Time: 161 msec MV E velocity: 98.10 cm/s MV A velocity: 105.00 cm/s MV E/A ratio:  0.93 Mary Scientist, physiological signed by Phineas Inches  Signature Date/Time: 03/03/2021/3:14:28 PM    Final     Anti-infectives: Anti-infectives (From admission, onward)  Start     Dose/Rate Route Frequency Ordered Stop   03/02/21 2000  cefTRIAXone (ROCEPHIN) 2 g in sodium chloride 0.9 % 100 mL IVPB        2 g 200 mL/hr over 30 Minutes Intravenous Every 24 hours 03/01/21 2047 03/07/21 1959   03/01/21 2200  azithromycin (ZITHROMAX) 500 mg in sodium chloride 0.9 % 250 mL IVPB        500 mg 250 mL/hr over 60 Minutes Intravenous Every 24 hours 03/01/21 2047 03/06/21 2159   03/01/21 1915  cefTRIAXone (ROCEPHIN) 1 g in sodium chloride 0.9 % 100 mL IVPB        1 g 200 mL/hr over 30 Minutes Intravenous  Once 03/01/21 1906 03/01/21 1957   03/01/21 1915  metroNIDAZOLE (FLAGYL) IVPB 500 mg        500 mg 100 mL/hr over 60 Minutes Intravenous Every 12 hours 03/01/21 1906          Assessment/Plan  MATIS MONNIER is an 88 y.o. 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 Sips of clears ok. - no emergent surgical needs, continue non-operative management at present - mobilize as able   Moderate hiatal hernia  Probable PNA COVID postitive - per pt diagnosed 02/08/21 CKD HTN H/o CHF Afib - went into RVR, on cardizem gtt and hep gtt leukocytosis - downtrending    LOS: 3 days   I reviewed nursing notes, hospitalist notes, last 24 h vitals and pain scores, last 48 h intake and output, last 24 h labs and trends, and last 24 h imaging results.  This care required moderate level of medical decision making.   Obie Dredge, [facility], [facility]

## 2021-03-04 NOTE — Progress Notes (Signed)
°  Transition of Care [facility]) Screening Note   Patient Details  Name: Angel Cooper Date of Birth: January 12, 1931   Transition of Care [facility]) CM/SW Contact:    Cyndi Bender, RN Phone Number: 03/04/2021, 8:48 AM    Transition of Care Department [facility]) has reviewed patient and no TOC needs have been identified at this time. We will continue to monitor patient advancement through interdisciplinary progression rounds. If new patient transition needs arise, please place a TOC consult.

## 2021-03-05 DIAGNOSIS — I5042 Chronic combined systolic (congestive) and diastolic (congestive) heart failure: Secondary | ICD-10-CM | POA: Insufficient documentation

## 2021-03-05 DIAGNOSIS — I4891 Unspecified atrial fibrillation: Secondary | ICD-10-CM | POA: Diagnosis not present

## 2021-03-05 DIAGNOSIS — I5043 Acute on chronic combined systolic (congestive) and diastolic (congestive) heart failure: Secondary | ICD-10-CM

## 2021-03-05 DIAGNOSIS — I25810 Atherosclerosis of coronary artery bypass graft(s) without angina pectoris: Secondary | ICD-10-CM | POA: Diagnosis not present

## 2021-03-05 DIAGNOSIS — D638 Anemia in other chronic diseases classified elsewhere: Secondary | ICD-10-CM | POA: Diagnosis not present

## 2021-03-05 LAB — BASIC METABOLIC PANEL
Anion gap: 13 (ref 5–15)
BUN: 40 mg/dL — ABNORMAL HIGH (ref 8–23)
CO2: 21 mmol/L — ABNORMAL LOW (ref 22–32)
Calcium: 8.4 mg/dL — ABNORMAL LOW (ref 8.9–10.3)
Chloride: 102 mmol/L (ref 98–111)
Creatinine, Ser: 1.69 mg/dL — ABNORMAL HIGH (ref 0.61–1.24)
GFR, Estimated: 38 mL/min — ABNORMAL LOW (ref >60)
Glucose, Bld: 218 mg/dL — ABNORMAL HIGH (ref 70–99)
Potassium: 4.5 mmol/L (ref 3.5–5.1)
Sodium: 136 mmol/L (ref 135–145)

## 2021-03-05 LAB — CBC
HCT: 29.5 % — ABNORMAL LOW (ref 39.0–52.0)
Hemoglobin: 9.5 g/dL — ABNORMAL LOW (ref 13.0–17.0)
MCH: 29.1 pg (ref 26.0–34.0)
MCHC: 32.2 g/dL (ref 30.0–36.0)
MCV: 90.5 fL (ref 80.0–100.0)
Platelets: 492 10*3/uL — ABNORMAL HIGH (ref 150–400)
RBC: 3.26 MIL/uL — ABNORMAL LOW (ref 4.22–5.81)
RDW: 17.3 % — ABNORMAL HIGH (ref 11.5–15.5)
WBC: 23.1 10*3/uL — ABNORMAL HIGH (ref 4.0–10.5)
nRBC: 0 % (ref 0.0–0.2)

## 2021-03-05 LAB — HEPARIN LEVEL (UNFRACTIONATED): Heparin Unfractionated: 0.48 IU/mL (ref 0.30–0.70)

## 2021-03-05 MED ORDER — DILTIAZEM HCL ER 60 MG PO CP12
60.0000 mg | ORAL_CAPSULE | Freq: Two times a day (BID) | ORAL | Status: DC
  Administered 2021-03-05 – 2021-03-11 (×12): 60 mg via ORAL
  Filled 2021-03-05 (×13): qty 1

## 2021-03-05 MED ORDER — APIXABAN 2.5 MG PO TABS
2.5000 mg | ORAL_TABLET | Freq: Two times a day (BID) | ORAL | Status: DC
  Administered 2021-03-05 – 2021-03-06 (×3): 2.5 mg via ORAL
  Filled 2021-03-05 (×3): qty 1

## 2021-03-05 MED ORDER — DOCUSATE SODIUM 100 MG PO CAPS
100.0000 mg | ORAL_CAPSULE | Freq: Two times a day (BID) | ORAL | Status: DC
  Administered 2021-03-05 – 2021-03-11 (×12): 100 mg via ORAL
  Filled 2021-03-05 (×12): qty 1

## 2021-03-05 MED ORDER — PREDNISONE 20 MG PO TABS
40.0000 mg | ORAL_TABLET | Freq: Every day | ORAL | Status: AC
  Administered 2021-03-05 – 2021-03-07 (×3): 40 mg via ORAL
  Filled 2021-03-05 (×3): qty 2

## 2021-03-05 MED ORDER — COLCHICINE 0.3 MG HALF TABLET
0.3000 mg | ORAL_TABLET | Freq: Every day | ORAL | Status: AC
  Administered 2021-03-06 – 2021-03-08 (×3): 0.3 mg via ORAL
  Filled 2021-03-05 (×3): qty 1

## 2021-03-05 MED ORDER — POLYETHYLENE GLYCOL 3350 17 G PO PACK
17.0000 g | PACK | Freq: Every day | ORAL | Status: DC
  Administered 2021-03-05 – 2021-03-10 (×4): 17 g via ORAL
  Filled 2021-03-05 (×6): qty 1

## 2021-03-05 NOTE — Progress Notes (Signed)
PROGRESS NOTE  Angel Cooper RXV:[id number] DOB: Nov 21, 1930 DOA: 03/01/2021 PCP: [name], MD   LOS: 4 days   Brief Narrative / Interim history: 88 year old male with CKD 3B, paroxysmal A. fib, history of renal resection due to [place], HTN, HLD comes into the hospital with abdominal pain for the past 3 days.  Apparently he was diagnosed with COVID as an outpatient about 2 weeks ago, was prescribed medication but never took them.  He saw his PCP a few days ago, had some respiratory symptoms and a chest x-ray showed pneumonia.  He then developed abdominal discomfort and came to the hospital.  He was found to have a small bowel obstruction, A. fib with RVR as well as left lobar pneumonia.  General surgery consulted.  He improved with conservative management and his NG tube was discontinued 2/6.  PT recommends SNF  Subjective / 24h Interval events: -His right shoulder/arm is much much better today.  He feels he can move it more, pain is better controlled  Assessment & Plan: * Partial small bowel obstruction ([place])- (present on admission) - General surgery consulted and followed patient while hospitalized.  He was treated with conservative management with NG tube, IV fluids, n.p.o.  He improved, NG tube was discontinued 2/6 and his diet is slowly advanced.  He is doing better this morning  Atrial fibrillation with RVR ([place])- (present on admission) - Patient was started on Cardizem infusion while strict n.p.o.,, once his diet was advanced 2/6 he was placed on oral Cardizem 30 every 6.  He is off IV infusion, heart rate much better controlled.  Will consolidate and switch to 60 mg Q2 and potentially on discharge could go to 120 every 24 extended release.  He was on heparin infusion while n.p.o. and converted to Eliquis.  He was not on anticoagulation prior to admission.  I discussed with patient's son over the phone, they are not sure why he is not on anticoagulation but believe he may have not "felt  good" in the past but they do not remember exactly.  He has not had any falls and is otherwise active, somewhat limited by his knees.  Start Eliquis  CAP (community acquired pneumonia) -Started on antibiotics with ceftriaxone and azithromycin, continue for a total of 5 days.  Respiratory status is stable  Chronic kidney disease, stage 3b ([place])- (present on admission) - Baseline creatinine around 1.5.  Currently at baseline.  Resumed home furosemide yesterday, continue.  Creatinine stable this morning  Essential hypertension- (present on admission) - Currently on furosemide, diltiazem.  Stable  Anemia of chronic disease- (present on admission) -Hemoglobin stable, no bleeding  Chronic combined systolic and diastolic heart failure ([place]) - 2D echo done this hospitalization shows an EF of 35-40%.  Patient has a history of coronary artery disease, and CABG in 1999.  His most recent 2D echo in the system is in 2015 and EF at that time was 45 to 50% with diffuse hypokinesis.  Continue Lasix, does not appear overly fluid overloaded  Right shoulder pain due to gout - Started on 2/5, patient reports history of gouty attack at various sites of the body, this reminds him of one.  Received 125 Solu-Medrol 2/6, started on colchicine, significantly better today, he can move his arm now.  Switch to prednisone do a short 4-day course  History of COVID-19 - Apparently tested positive for COVID 2 weeks ago.  Given lack of hard proof, keep on isolation for now.  Lung infiltrates either  represents post-COVID pneumonitis versus bacterial infection.  Currently on antibiotics  Personal history of other malignant neoplasm of kidney - Noted  Leukocytosis - Due to pneumonia as well as steroid use  CAD (coronary artery disease) of artery bypass graft- (present on admission) - No chest pain, history of CABG in 1999   Scheduled Meds:  allopurinol  50 mg Oral Daily   apixaban  2.5 mg Oral BID   [START ON  03/06/2021] colchicine  0.3 mg Oral Daily   diltiazem  60 mg Oral Q12H   docusate sodium  100 mg Oral BID   feeding supplement  237 mL Oral TID BM   furosemide  80 mg Oral Daily   mouth rinse  15 mL Mouth Rinse BID   multivitamin  1 tablet Oral QHS   polyethylene glycol  17 g Oral Daily   predniSONE  40 mg Oral Q breakfast   Continuous Infusions:  azithromycin 500 mg (03/04/21 2205)   cefTRIAXone (ROCEPHIN)  IV Stopped (03/04/21 2031)   PRN Meds:.ALPRAZolam, HYDROmorphone (DILAUDID) injection, ondansetron (ZOFRAN) IV, oxyCODONE  Diet Orders (From admission, onward)     Start     Ordered   03/05/21 1036  DIET SOFT Room service appropriate? Yes; Fluid consistency: Thin  Diet effective now       Question Answer Comment  Room service appropriate? Yes   Fluid consistency: Thin      03/05/21 1036            DVT prophylaxis: apixaban (ELIQUIS) tablet 2.5 mg Start: 03/05/21 1100 SCDs Start: 03/01/21 2048 apixaban (ELIQUIS) tablet 2.5 mg   Lab Results  Component Value Date   PLT 492 (H) 03/05/2021      Code Status: Full Code  Family Communication: No family at bedside, updated son over the phone  Status is: Inpatient  Remains inpatient appropriate because: Severity of illness  Level of care: Progressive  Consultants:  General surgery  Procedures:  none  Microbiology  none  Antimicrobials: Ceftriaxone / Vanc / Metronidazole    Objective: Vitals:   03/05/21 0425 03/05/21 0433 03/05/21 0506 03/05/21 0800  BP: 111/63  (!) 117/59 115/63  Pulse:    73  Resp: 20   13  Temp: 97.7 F (36.5 C)     TempSrc: Axillary     SpO2:    94%  Weight:  63 kg    Height:        Intake/Output Summary (Last 24 hours) at 03/05/2021 1049 Last data filed at 03/05/2021 0558 Gross per 24 hour  Intake 705.86 ml  Output 600 ml  Net 105.86 ml    Wt Readings from Last 3 Encounters:  03/05/21 63 kg  01/29/21 52.2 kg  04/17/18 46.7 kg    Examination:  Constitutional:  NAD Eyes: lids and conjunctivae normal, no scleral icterus ENMT: mmm Neck: normal, supple Respiratory: clear to auscultation bilaterally, no wheezing, no crackles. Normal respiratory effort.  Cardiovascular: Regular rate and rhythm, no murmurs / rubs / gallops. No LE edema. Abdomen: soft, no distention, no tenderness. Bowel sounds positive.  Skin: no rashes Neurologic: no focal deficits, equal strength   Data Reviewed: I have independently reviewed following labs and imaging studies   CBC Recent Labs  Lab 03/01/21 1753 03/02/21 0227 03/03/21 0206 03/04/21 0208 03/05/21 0049  WBC 15.5* 14.3* 13.9* 17.8* 23.1*  HGB 11.3* 10.1* 9.9* 8.6* 9.5*  HCT 35.4* 31.8* 30.5* 27.3* 29.5*  PLT 686* 595* 572* 486* 492*  MCV 88.5 89.3 88.9 90.4  90.5  MCH 28.3 28.4 28.9 28.5 29.1  MCHC 31.9 31.8 32.5 31.5 32.2  RDW 17.1* 17.1* 17.2* 17.2* 17.3*  LYMPHSABS 1.0  --   --   --   --   MONOABS 1.1*  --   --   --   --   EOSABS 0.1  --   --   --   --   BASOSABS 0.1  --   --   --   --      Recent Labs  Lab 03/01/21 1753 03/01/21 2053 03/02/21 0226 03/03/21 1147 03/04/21 0208 03/05/21 0049  NA 133*  --  135 138 137 136  K 3.8  --  4.1 3.8 3.5 4.5  CL 97*  --  99 100 104 102  CO2 24  --  24 22 22  21*  GLUCOSE 118*  --  111* 88 71 218*  BUN 31*  --  27* 24* 26* 40*  CREATININE 1.51*  --  1.38* 1.61* 1.58* 1.69*  CALCIUM 8.7*  --  8.2* 8.6* 8.0* 8.4*  AST 20  --  15  --   --   --   ALT 6  --  7  --   --   --   ALKPHOS 71  --  61  --   --   --   BILITOT 0.8  --  0.4  --   --   --   ALBUMIN 3.1*  --  2.6*  --   --   --   MG 1.6*  --  2.0  --   --   --   LATICACIDVEN 2.0* 1.7  --   --   --   --      ------------------------------------------------------------------------------------------------------------------ No results for input(s): CHOL, HDL, LDLCALC, TRIG, CHOLHDL, LDLDIRECT in the last 72 hours.  No results found for:  HGBA1C ------------------------------------------------------------------------------------------------------------------ No results for input(s): TSH, T4TOTAL, T3FREE, THYROIDAB in the last 72 hours.  Invalid input(s): FREET3  Cardiac Enzymes No results for input(s): CKMB, TROPONINI, MYOGLOBIN in the last 168 hours.  Invalid input(s): CK ------------------------------------------------------------------------------------------------------------------ No results found for: BNP  CBG: No results for input(s): GLUCAP in the last 168 hours.  Recent Results (from the past 240 hour(s))  Blood culture (routine x 2)     Status: None (Preliminary result)   Collection Time: 03/01/21  7:00 PM   Specimen: BLOOD RIGHT ARM  Result Value Ref Range Status   Specimen Description BLOOD RIGHT ARM  Final   Special Requests   Final    BOTTLES DRAWN AEROBIC AND ANAEROBIC Blood Culture results may not be optimal due to an excessive volume of blood received in culture bottles   Culture   Final    NO GROWTH 4 DAYS Performed at [facility], [place] [address], [place], [place] [address]    Report Status PENDING  Incomplete  Blood culture (routine x 2)     Status: None (Preliminary result)   Collection Time: 03/01/21  7:05 PM   Specimen: BLOOD LEFT ARM  Result Value Ref Range Status   Specimen Description BLOOD LEFT ARM  Final   Special Requests   Final    BOTTLES DRAWN AEROBIC AND ANAEROBIC Blood Culture adequate volume   Culture   Final    NO GROWTH 4 DAYS Performed at [facility], [place] [address], [place], [place] [address]    Report Status PENDING  Incomplete  Respiratory (~20 pathogens) panel by PCR  Status: None   Collection Time: 03/01/21  8:11 PM   Specimen: Nasopharyngeal Swab; Respiratory  Result Value Ref Range Status   Adenovirus NOT DETECTED NOT DETECTED Final   Coronavirus 229E NOT DETECTED NOT DETECTED Final    Comment: (NOTE) The Coronavirus on the Respiratory Panel,  DOES NOT test for the novel  Coronavirus (2019 nCoV)    Coronavirus HKU1 NOT DETECTED NOT DETECTED Final   Coronavirus NL63 NOT DETECTED NOT DETECTED Final   Coronavirus OC43 NOT DETECTED NOT DETECTED Final   Metapneumovirus NOT DETECTED NOT DETECTED Final   Rhinovirus / Enterovirus NOT DETECTED NOT DETECTED Final   Influenza A NOT DETECTED NOT DETECTED Final   Influenza B NOT DETECTED NOT DETECTED Final   Parainfluenza Virus 1 NOT DETECTED NOT DETECTED Final   Parainfluenza Virus 2 NOT DETECTED NOT DETECTED Final   Parainfluenza Virus 3 NOT DETECTED NOT DETECTED Final   Parainfluenza Virus 4 NOT DETECTED NOT DETECTED Final   Respiratory Syncytial Virus NOT DETECTED NOT DETECTED Final   Bordetella pertussis NOT DETECTED NOT DETECTED Final   Bordetella Parapertussis NOT DETECTED NOT DETECTED Final   Chlamydophila pneumoniae NOT DETECTED NOT DETECTED Final   Mycoplasma pneumoniae NOT DETECTED NOT DETECTED Final    Comment: Performed at [facility], [place]. [address] [place], [place] [address]  Resp Panel by RT-PCR (Flu A&B, Covid) Nasopharyngeal Swab     Status: Abnormal   Collection Time: 03/02/21  3:12 AM   Specimen: Nasopharyngeal Swab; Nasopharyngeal(NP) swabs in vial transport medium  Result Value Ref Range Status   SARS Coronavirus 2 by RT PCR POSITIVE (A) NEGATIVE Final    Comment: (NOTE) SARS-CoV-2 target nucleic acids are DETECTED.  The SARS-CoV-2 RNA is generally detectable in upper respiratory specimens during the acute phase of infection. Positive results are indicative of the presence of the identified virus, but do not rule out bacterial infection or co-infection with other pathogens not detected by the test. Clinical correlation with patient history and other diagnostic information is necessary to determine patient infection status. The expected result is Negative.  Fact Sheet for Patients: EntrepreneurPulse.com.au  Fact Sheet for Healthcare  Providers: IncredibleEmployment.be  This test is not yet approved or cleared by the [place] FDA and  has been authorized for detection and/or diagnosis of SARS-CoV-2 by FDA under an Emergency Use Authorization (EUA).  This EUA will remain in effect (meaning this test can be used) for the duration of  the COVID-19 declaration under Section 564(b)(1) of the A ct, 21 U.S.C. section 360bbb-3(b)(1), unless the authorization is terminated or revoked sooner.     Influenza A by PCR NEGATIVE NEGATIVE Final   Influenza B by PCR NEGATIVE NEGATIVE Final    Comment: (NOTE) The Xpert Xpress SARS-CoV-2/FLU/RSV plus assay is intended as an aid in the diagnosis of influenza from Nasopharyngeal swab specimens and should not be used as a sole basis for treatment. Nasal washings and aspirates are unacceptable for Xpert Xpress SARS-CoV-2/FLU/RSV testing.  Fact Sheet for Patients: EntrepreneurPulse.com.au  Fact Sheet for Healthcare Providers: IncredibleEmployment.be  This test is not yet approved or cleared by the [place] FDA and has been authorized for detection and/or diagnosis of SARS-CoV-2 by FDA under an Emergency Use Authorization (EUA). This EUA will remain in effect (meaning this test can be used) for the duration of the COVID-19 declaration under Section 564(b)(1) of the Act, 21 U.S.C. section 360bbb-3(b)(1), unless the authorization is terminated or revoked.  Performed at [facility],  [address] [place], [place] [address]       Radiology Studies: No results found.   Marzetta Board, MD, PhD [facility]  Between 7 am - 7 pm I am available, please contact me via Amion (for emergencies) or Securechat (non urgent messages)  Between 7 pm - 7 am I am not available, please contact night coverage MD/APP via Amion

## 2021-03-05 NOTE — Progress Notes (Addendum)
[place] [facility] for IV heparin -> apixaban Indication: atrial fibrillation  Patient Measurements: Height: 5\' 4"  (162.6 cm) Weight: 63 kg (138 lb 14.2 oz) IBW/kg (Calculated) : 59.2 Heparin Dosing Weight: 52.2 kg   Vital Signs: Temp: 97.7 F (36.5 C) (02/07 0425) Temp Source: Axillary (02/07 0425) BP: 115/63 (02/07 0800) Pulse Rate: 73 (02/07 0800)  Labs: Recent Labs    03/03/21 0206 03/03/21 1147 03/03/21 2006 03/04/21 0208 03/04/21 1335 03/05/21 0049 03/05/21 0801  HGB 9.9*  --   --  8.6*  --  9.5*  --   HCT 30.5*  --   --  27.3*  --  29.5*  --   PLT 572*  --   --  486*  --  492*  --   APTT  --   --   --  103*  --   --   --   HEPARINUNFRC 0.23* 0.16*   < > 0.45 0.56  --  0.48  CREATININE  --  1.61*  --  1.58*  --  1.69*  --    < > = values in this interval not displayed.     Estimated Creatinine Clearance: 24.8 mL/min (A) (by C-G formula based on SCr of 1.69 mg/dL (H)).  Assessment: 88 yo M presents with small bowel obstruction. PMH significant for Afib. No PTA AC. CHADs2-VASC 4. Pharmacy consulted to start IV heparin.   Heparin level therapeutic (0.48) on infusion at 1450 units/hr. Hgb 9.5, no overt bleeding noted. MD transition to Eliquis PTA.  TOC benefits check for Eliquis 2.5mg  po BID done and copay will be $45 per month.   Goal of Therapy:  Heparin level 0.3-0.7 units/ml Monitor platelets by anticoagulation protocol: Yes   Plan:  Apixaban 2.5mg  po BID D/c heparin when first dose of apixaban given F/u CBC  Sherlon Handing, PharmD, BCPS Please see amion for complete clinical pharmacist phone list 03/05/2021 9:47 AM

## 2021-03-05 NOTE — Progress Notes (Signed)
[facility] Progress Note     Subjective: CC:  Overall feels better - tolerating FLD. Denied abd pain. Having flatus. No BM yet. R arm pain significantly improved s/p resumption of colchicine and a dose of steroids.    Objective: Vital signs in last 24 hours: Temp:  [97.6 F (36.4 C)-99.3 F (37.4 C)] 97.7 F (36.5 C) (02/07 0425) Pulse Rate:  [73-119] 73 (02/07 0800) Resp:  [13-25] 13 (02/07 0800) BP: (111-126)/(53-90) 115/63 (02/07 0800) SpO2:  [89 %-94 %] 94 % (02/07 0800) Weight:  [63 kg] 63 kg (02/07 0433) Last BM Date: 03/01/21  Intake/Output from previous day: 02/06 0701 - 02/07 0700 In: 705.9 [P.O.:240; I.V.:345.5; IV Piggyback:120.4] Out: 650 [Urine:600; Emesis/NG output:50] Intake/Output this shift: No intake/output data recorded.  PE: Gen:  Alert, NAD, pleasant, chronically ill appearing - appears more well compared to yesterday. Card:  rrr Pulm:  Normal effort, clear to auscultation bilaterally, remains on supplemental O2 via  Abd: Soft, non-tender, non-distended, no HSM, no peritonitis; interval removal of NGT Skin: warm and dry, no rashes  MSK: moving all extremities spontaneously - able to use R arm this morning. Psych: A&Ox3   Lab Results:  Recent Labs    03/04/21 0208 03/05/21 0049  WBC 17.8* 23.1*  HGB 8.6* 9.5*  HCT 27.3* 29.5*  PLT 486* 492*   BMET Recent Labs    03/04/21 0208 03/05/21 0049  NA 137 136  K 3.5 4.5  CL 104 102  CO2 22 21*  GLUCOSE 71 218*  BUN 26* 40*  CREATININE 1.58* 1.69*  CALCIUM 8.0* 8.4*   PT/INR No results for input(s): LABPROT, INR in the last 72 hours. CMP     Component Value Date/Time   NA 136 03/05/2021 0049   NA 139 02/22/2013 1032   K 4.5 03/05/2021 0049   K 5.0 02/22/2013 1032   CL 102 03/05/2021 0049   CO2 21 (L) 03/05/2021 0049   CO2 23 02/22/2013 1032   GLUCOSE 218 (H) 03/05/2021 0049   GLUCOSE 100 02/22/2013 1032   BUN 40 (H) 03/05/2021 0049   BUN 24.7 02/22/2013 1032    CREATININE 1.69 (H) 03/05/2021 0049   CREATININE 1.5 (H) 02/22/2013 1032   CALCIUM 8.4 (L) 03/05/2021 0049   CALCIUM 9.1 02/22/2013 1032   PROT 5.7 (L) 03/02/2021 0226   PROT 6.9 02/22/2013 1032   ALBUMIN 2.6 (L) 03/02/2021 0226   ALBUMIN 3.6 02/22/2013 1032   AST 15 03/02/2021 0226   AST 11 02/22/2013 1032   ALT 7 03/02/2021 0226   ALT <6 02/22/2013 1032   ALKPHOS 61 03/02/2021 0226   ALKPHOS 75 02/22/2013 1032   BILITOT 0.4 03/02/2021 0226   BILITOT 0.36 02/22/2013 1032   GFRNONAA 38 (L) 03/05/2021 0049   GFRAA 51 (L) 12/08/2018 0508   Lipase     Component Value Date/Time   LIPASE 24 03/01/2021 1753       Studies/Results: DG Abd Portable 1V  Result Date: 03/04/2021 CLINICAL DATA:  Small bowel obstruction EXAM: PORTABLE ABDOMEN - 1 VIEW COMPARISON:  03/03/2021 FINDINGS: Nasogastric tube tip antropyloric. Contrast medium in the bottom of the hiatal hernia noted. There is also some focally concentrated contrast medium in the left upper quadrant. There is also some dilute contrast medium currently visible in the transverse and descending colon. Most of the small bowel is relatively gasless. There is a mildly dilated left upper quadrant loop of jejunum measuring 3.1 cm in diameter, previous loops in this vicinity up  to 4.2 cm. Dextroconvex thoracolumbar scoliosis noted with evidence of underlying spondylosis and degenerative disc disease. Questionable airspace opacity at the right lung base. IMPRESSION: 1. Left upper quadrant loop of jejunum is mildly dilated at 3.1 cm. Previous loops in this vicinity measured up to 4.2 cm. 2. There is dilute contrast medium in the transverse and descending colon. Small amount of concentrated contrast medium in the expected vicinity of the hiatal hernia, and in the left upper quadrant. Electronically Signed   By: Van Clines M.D.   On: 03/04/2021 08:20   ECHOCARDIOGRAM COMPLETE  Result Date: 03/03/2021    ECHOCARDIOGRAM REPORT   Patient Name:    Angel Cooper Date of Exam: 03/03/2021 Medical Rec #:  [id number]         Height:       64.0 in Accession #:    [id number]        Weight:       128.3 lb Date of Birth:  22-Dec-1930          BSA:          1.620 m Patient Age:    88 years          BP:           129/63 mmHg Patient Gender: M                 HR:           77 bpm. Exam Location:  Inpatient Procedure: 2D Echo, Cardiac Doppler and Color Doppler Indications:    Atrial Fibrillation  History:        Patient has prior history of Echocardiogram examinations, most                 recent 01/28/2013. CAD, Prior CABG, Arrythmias:Atrial                 Fibrillation; Risk Factors:Hypertension.  Sonographer:    Wenda Low Referring Phys: [place] Comments: [place]  1. Global moderate hypokinesis of the LV. Akinetic anteroseptal/inferoseptal wall from base to the apex. Consistent with prior CABG. Left ventricular ejection fraction, by estimation, is 35 to 40%. The left ventricle has moderately decreased function. The left ventricle demonstrates global hypokinesis. Left ventricular diastolic parameters are consistent with Grade I diastolic dysfunction (impaired relaxation).  2. IVC not well visualized to estimate RVSP. Right ventricular systolic function is normal. The right ventricular size is normal.  3. Left atrial size was severely dilated.  4. Right atrial size was moderately dilated.  5. Mild mitral valve regurgitation.  6. The aortic valve was not well visualized. Aortic valve regurgitation is mild to moderate. Aortic valve sclerosis/calcification is present, without any evidence of aortic stenosis.  7. The inferior vena cava IVC not well visualized. Conclusion(s)/Recommendation(s): No prior imagees to compare. FINDINGS  Left Ventricle: Global moderate hypokinesis of the LV. Akinetic anteroseptal/inferoseptal wall from base to the apex. Consistent with prior CABG. Left ventricular ejection fraction, by estimation, is 35 to 40%.  The left ventricle has moderately decreased function. The left ventricle demonstrates global hypokinesis. The left ventricular internal cavity size was normal in size. There is no left ventricular hypertrophy. Abnormal (paradoxical) septal motion consistent with post-operative status. Left ventricular diastolic parameters are consistent with Grade I diastolic dysfunction (impaired relaxation). Right Ventricle: IVC not well visualized to estimate RVSP. The right ventricular size is normal. No increase in right ventricular wall thickness. Right ventricular systolic function is normal. Left  Atrium: Left atrial size was severely dilated. Right Atrium: Right atrial size was moderately dilated. Pericardium: There is no evidence of pericardial effusion. Mitral Valve: There is moderate calcification of the mitral valve leaflet(s). Mild mitral valve regurgitation. MV peak gradient, 5.3 mmHg. The mean mitral valve gradient is 2.0 mmHg. Tricuspid Valve: The tricuspid valve is grossly normal. Tricuspid valve regurgitation is mild. Aortic Valve: The aortic valve was not well visualized. Aortic valve regurgitation is mild to moderate. Aortic regurgitation PHT measures 263 msec. Aortic valve sclerosis/calcification is present, without any evidence of aortic stenosis. Aortic valve mean gradient measures 7.0 mmHg. Aortic valve peak gradient measures 13.5 mmHg. Aortic valve area, by VTI measures 1.99 cm. Pulmonic Valve: The pulmonic valve was normal in structure. Pulmonic valve regurgitation is not visualized. Aorta: The aortic root is normal in size and structure. Venous: The inferior vena cava IVC not well visualized. IAS/Shunts: No atrial level shunt detected by color flow Doppler.  LEFT VENTRICLE PLAX 2D LVIDd:         5.10 cm      Diastology LVIDs:         4.20 cm      LV e' medial:    6.53 cm/s LV PW:         0.80 cm      LV E/e' medial:  15.0 LV IVS:        0.80 cm      LV e' lateral:   10.60 cm/s LVOT diam:     1.90 cm       LV E/e' lateral: 9.3 LV SV:         74 LV SV Index:   46 LVOT Area:     2.84 cm  LV Volumes (MOD) LV vol d, MOD A2C: 108.0 ml LV vol d, MOD A4C: 89.6 ml LV vol s, MOD A2C: 39.9 ml LV vol s, MOD A4C: 54.1 ml LV SV MOD A2C:     68.1 ml LV SV MOD A4C:     89.6 ml LV SV MOD BP:      52.2 ml RIGHT VENTRICLE RV Basal diam:  4.20 cm RV Mid diam:    2.80 cm RV S prime:     12.70 cm/s TAPSE (M-mode): 2.2 cm LEFT ATRIUM              Index        RIGHT ATRIUM           Index LA diam:        4.70 cm  2.90 cm/m   RA Area:     18.30 cm LA Vol (A2C):   85.8 ml  52.96 ml/m  RA Volume:   48.30 ml  29.81 ml/m LA Vol (A4C):   109.0 ml 67.28 ml/m LA Biplane Vol: 99.9 ml  61.67 ml/m  AORTIC VALVE                     PULMONIC VALVE AV Area (Vmax):    1.91 cm      PV Vmax:       1.20 m/s AV Area (Vmean):   1.74 cm      PV Peak grad:  5.8 mmHg AV Area (VTI):     1.99 cm AV Vmax:           184.00 cm/s AV Vmean:          121.000 cm/s AV VTI:  0.373 m AV Peak Grad:      13.5 mmHg AV Mean Grad:      7.0 mmHg LVOT Vmax:         124.00 cm/s LVOT Vmean:        74.100 cm/s LVOT VTI:          0.262 m LVOT/AV VTI ratio: 0.70 AI PHT:            263 msec  AORTA Ao Root diam: 3.20 cm MITRAL VALVE                TRICUSPID VALVE MV Area (PHT): 4.71 cm     TR Peak grad:   38.7 mmHg MV Area VTI:   3.01 cm     TR Vmax:        311.00 cm/s MV Peak grad:  5.3 mmHg MV Mean grad:  2.0 mmHg     SHUNTS MV Vmax:       1.15 m/s     Systemic VTI:  0.26 m MV Vmean:      68.5 cm/s    Systemic Diam: 1.90 cm MV Decel Time: 161 msec MV E velocity: 98.10 cm/s MV A velocity: 105.00 cm/s MV E/A ratio:  0.93 Mary Scientist, physiological signed by Phineas Inches Signature Date/Time: 03/03/2021/3:14:28 PM    Final     Anti-infectives: Anti-infectives (From admission, onward)    Start     Dose/Rate Route Frequency Ordered Stop   03/02/21 2000  cefTRIAXone (ROCEPHIN) 2 g in sodium chloride 0.9 % 100 mL IVPB        2 g 200 mL/hr over 30 Minutes Intravenous  Every 24 hours 03/01/21 2047 03/07/21 1959   03/01/21 2200  azithromycin (ZITHROMAX) 500 mg in sodium chloride 0.9 % 250 mL IVPB        500 mg 250 mL/hr over 60 Minutes Intravenous Every 24 hours 03/01/21 2047 03/06/21 2159   03/01/21 1915  cefTRIAXone (ROCEPHIN) 1 g in sodium chloride 0.9 % 100 mL IVPB        1 g 200 mL/hr over 30 Minutes Intravenous  Once 03/01/21 1906 03/01/21 1957   03/01/21 1915  metroNIDAZOLE (FLAGYL) IVPB 500 mg  Status:  Discontinued        500 mg 100 mL/hr over 60 Minutes Intravenous Every 12 hours 03/01/21 1906 03/04/21 1444        Assessment/Plan  BRAHM BARBEAU is an 88 y.o. male with  pSBO - likely related to adhesive disease from previous surgery - SBO protocol >> contrast in colon  - NG removed 2/6, tolerating FLD, advance to SOFT - start colace BID and daily miralax - no emergent surgical needs, continue non-operative management at present - mobilize as able - noted elevation in WBC, no concerns about his abdomen, may be acutely elevated due to recent steroid use.  Moderate hiatal hernia  PNA - abx per primary, remains on O2 via [place] COVID postitive - per pt diagnosed 02/08/21 CKD HTN H/o CHF Afib - went into RVR, on cardizem gtt and hep gtt leukocytosis - downtrending Gout     LOS: 4 days   I reviewed nursing notes, hospitalist notes, last 24 h vitals and pain scores, last 48 h intake and output, last 24 h labs and trends, and last 24 h imaging results.  This care required moderate level of medical decision making.   Obie Dredge, [facility], [facility]

## 2021-03-05 NOTE — Progress Notes (Signed)
°   03/04/21 1139  Assess: MEWS Score  Temp 99.3 F (37.4 C)  BP 121/65  Pulse Rate (!) 116  ECG Heart Rate (!) 121  Resp 20  Level of Consciousness Alert  SpO2 93 %  O2 Device Nasal Cannula  Patient Activity (if Appropriate) In bed  O2 Flow Rate (L/min) 3 L/min  Assess: MEWS Score  MEWS Temp 0  MEWS Systolic 0  MEWS Pulse 2  MEWS RR 0  MEWS LOC 0  MEWS Score 2  MEWS Score Color Yellow  Assess: if the MEWS score is Yellow or Red  Were vital signs taken at a resting state? Yes  Focused Assessment No change from prior assessment  Early Detection of Sepsis Score *See Row Information* Medium  MEWS guidelines implemented *See Row Information* Yes  Treat  MEWS Interventions Escalated (See documentation below)  Take Vital Signs  Increase Vital Sign Frequency  Yellow: Q 2hr X 2 then Q 4hr X 2, if remains yellow, continue Q 4hrs  Escalate  MEWS: Escalate Yellow: discuss with charge nurse/RN and consider discussing with provider and RRT  Notify: Charge Nurse/RN  Name of Charge Nurse/RN Notified Elwyn Lade., RN  Date Charge Nurse/RN Notified 03/04/21  Time Charge Nurse/RN Notified 1139

## 2021-03-05 NOTE — Plan of Care (Signed)
  Problem: Education: Goal: Knowledge of General Education information will improve Description Including pain rating scale, medication(s)/side effects and non-pharmacologic comfort measures Outcome: Progressing   Problem: Health Behavior/Discharge Planning: Goal: Ability to manage health-related needs will improve Outcome: Progressing   

## 2021-03-05 NOTE — Discharge Instructions (Signed)
Information on my medicine - ELIQUIS (apixaban)  This medication education was reviewed with me or my healthcare representative as part of my discharge preparation.  The pharmacist that spoke with me during my hospital stay was:  Franky Macho, [facility]  Why was Eliquis prescribed for you? Eliquis was prescribed for you to reduce the risk of a blood clot forming that can cause a stroke if you have a medical condition called atrial fibrillation (a type of irregular heartbeat).  What do You need to know about Eliquis ? Take your Eliquis TWICE DAILY - one tablet in the morning and one tablet in the evening with or without food. If you have difficulty swallowing the tablet whole please discuss with your pharmacist how to take the medication safely.  Take Eliquis exactly as prescribed by your doctor and DO NOT stop taking Eliquis without talking to the doctor who prescribed the medication.  Stopping may increase your risk of developing a stroke.  Refill your prescription before you run out.  After discharge, you should have regular check-up appointments with your healthcare provider that is prescribing your Eliquis.  In the future your dose may need to be changed if your kidney function or weight changes by a significant amount or as you get older.  What do you do if you miss a dose? If you miss a dose, take it as soon as you remember on the same day and resume taking twice daily.  Do not take more than one dose of ELIQUIS at the same time to make up a missed dose.  Important Safety Information A possible side effect of Eliquis is bleeding. You should call your healthcare provider right away if you experience any of the following: Bleeding from an injury or your nose that does not stop. Unusual colored urine (red or dark brown) or unusual colored stools (red or black). Unusual bruising for unknown reasons. A serious fall or if you hit your head (even if there is no bleeding).  Some medicines  may interact with Eliquis and might increase your risk of bleeding or clotting while on Eliquis. To help avoid this, consult your healthcare provider or pharmacist prior to using any new prescription or non-prescription medications, including herbals, vitamins, non-steroidal anti-inflammatory drugs (NSAIDs) and supplements.  This website has more information on Eliquis (apixaban): http://[email or web address].com/eliquis/home

## 2021-03-05 NOTE — TOC Initial Note (Addendum)
Transition of Care [facility]) - Initial/Assessment Note    Patient Details  Name: Angel Cooper MRN: [id number] Date of Birth: 1930/12/12  [facility]) CM/SW Contact:    Benard Halsted, LCSW Phone Number: 03/05/2021, 12:17 PM  Clinical Narrative:                 CSW received consult for possible SNF placement at time of discharge. CSW spoke with patient's son, Aaron Edelman. Patient's son reported that patient's spouse is currently unable to care for patient at their home given patients current physical needs and fall risk but that he will discuss SNF with patient tonight. CSW discussed insurance authorization process and will provide Medicare SNF ratings list. Patient has received 3 COVID vaccines. CSW will send out referrals for review. Son reported that [facility] or [facility] may be closest to patient's home. Patient is not managed by [facility], so SNF will have to obtain insurance approval.  Skilled Nursing Rehab Facilities-   [email or web address]   Ratings out of 5 possible   Name Address  Phone # [place] Inspection Overall  [facility] [address] [place] 5 5 2 4   [facility]  [address] [place], [place] [phone] 4 2 5 5   [facility] [place], [place] 4 1 1 1   [facility] [place], [place] 2 2 4 4   [facility] [place] [address] [place] 2 1 2 1   [place] 3 1 4 3   [facility] [address] [place] 5 2 2 3   [facility] [address], [place] 4 1 2 1   [address] ([place]) [place], [place] [phone] 5 1 2 2   [facility] [phone] [address], [place] [phone] 4 1 1 1   [facility] [address], [facility] [place] [phone] 4 1 2 1   [facility] [place]. [place], [place] [phone] 4 1 1 1           [place], [place] [address], [place] 4 2 3 3   [facility] [place] [address] [place] 3 1 5 4   [address] [place], [place] [phone] 2 1 1 1   [facility] [address], [employer] [phone] 2 2 3 3           [address] (no [facility]) [place] [address], [place] [phone] 4 5 5 5   [facility] (No Hum[employer]) [address], [place] 4 1 4 3   [place] (No UHC) [place], [place] [phone] 5 5 5 5   [facility] [address], [place] [phone] 3 3 4 4   [place] [address] [place]  [phone] 2 2 2 2   [facility] [address] [place] [phone] 3 1 3 2   [place] [address] [place] 1 1 2 1   [place] [address], [place] [phone] 2 1 1 1   [place], [place] 5 2 4 5   [facility] [address] [place] 2 1 1 1   [facility] [address] [place] 3 1 1 1   [facility] [place] 2 1 2  1  [facility] [place] [address], [place] [phone] 5 3 3 4   [place] [address], [place] 2 1 1 1   [place] (No Humana) [address], [place] [phone] 2 1 2 1   [facility] [address], [place] [phone] 3 1 1 1           [facility] [place], [place] 4 1 5 4   [facility]) [place], [place] 2 1 3 2   [facility]) [place] [address], [place] [phone] 3 1 4 3   [facility] [place] [address] [place] 4 1 4 3   [address] [place], [place] 3 3 1  1   [place] [facility]) [address] [place] [phone] 3 2 3 3      Expected Discharge Plan: [place] Barriers to Discharge: Continued Medical Work up, Insurance Authorization   Patient Goals and CMS Choice Patient states their goals for this hospitalization and ongoing recovery are:: Rehab CMS Medicare.gov Compare Post Acute Care list provided to:: Patient Represenative (must comment) Choice offered to / list presented to : Adult Children  Expected Discharge Plan and Services Expected Discharge Plan: [place] In-house Referral: Clinical Social Work   Post Acute Care Choice: [facility] Living arrangements for the past 2 months: [place]                                      Prior Living Arrangements/Services Living arrangements for the past 2 months: Single Family Home Lives with:: Spouse Patient language and need for interpreter reviewed:: Yes Do you feel safe going back to the place where you live?: Yes      Need for Family Participation in Patient Care: Yes (Comment) Care giver support system in place?: Yes (comment)   Criminal Activity/Legal Involvement Pertinent to Current Situation/Hospitalization: No - Comment as needed  Activities of Daily Living Home Assistive Devices/Equipment: Cane (specify quad or straight) ADL Screening (condition at time of admission) Patient's cognitive ability adequate to safely complete daily activities?: No Is the patient deaf or have difficulty hearing?: No Does the patient have difficulty seeing, even when wearing glasses/contacts?: No Does the patient have difficulty concentrating, remembering, or making decisions?: No Patient able to express need for assistance with ADLs?: Yes Does the patient have difficulty dressing or bathing?: Yes Independently performs ADLs?: No Communication: Independent Dressing (OT): Needs assistance Is this a change  from baseline?: Pre-admission baseline Grooming: Needs assistance Is this a change from baseline?: Pre-admission baseline Feeding: Needs assistance Is this a change from baseline?: Pre-admission baseline Bathing: Needs assistance Is this a change from baseline?: Pre-admission baseline Toileting: Needs assistance Is this a change from baseline?: Pre-admission baseline In/Out Bed: Needs assistance Is this a change from baseline?: Pre-admission baseline Walks in Home: Needs assistance Is this a change from baseline?: Pre-admission baseline Does the patient have difficulty walking or climbing stairs?: Yes Weakness of Legs: Both Weakness of Arms/Hands: None  Permission Sought/Granted Permission sought to share information with : Facility Sport and exercise psychologist, Family Supports Permission granted to share information with : Yes, Verbal Permission Granted  Share Information with NAME: Aaron Edelman  Permission granted to share info w AGENCY: SNFs  Permission granted to share info w Relationship: Son  Permission granted to share info w Contact  Information: [phone]  Emotional Assessment Appearance:: Appears stated age Attitude/Demeanor/Rapport: Engaged Affect (typically observed): Accepting, Appropriate Orientation: : Oriented to Self, Oriented to Place, Oriented to  Time, Oriented to Situation Alcohol / Substance Use: Not Applicable Psych Involvement: No (comment)  Admission diagnosis:  Small bowel obstruction ([place]) [K56.609] Encounter for imaging study to confirm nasogastric (NG) tube placement [Z01.89] Partial small bowel obstruction ([place]) [K56.600] Patient Active Problem List   Diagnosis Date Noted   Chronic combined systolic and diastolic heart failure ([place]) 03/05/2021   Protein-calorie malnutrition, severe 03/04/2021   Right shoulder pain due to gout 03/03/2021   History of COVID-19 03/02/2021   Partial small bowel obstruction ([place]) 03/01/2021   Personal history of other  malignant neoplasm of kidney 03/01/2021   CAP (community acquired pneumonia) 03/01/2021   Knee pain, bilateral 01/29/2021   Unable to walk 01/29/2021   Gouty arthritis 12/07/2018   Leukocytosis 12/07/2018   Essential hypertension 12/07/2018   Anemia of chronic disease 12/07/2018   Atrial fibrillation with RVR ([place]) 01/28/2013   CAD (coronary artery disease) of artery bypass graft 01/28/2013   Chronic kidney disease, stage 3b ([place]) 01/28/2013   Ureteral cancer ([place]) 01/24/2013   PCP:  [name], MD Pharmacy:   [facility], [place] - [address] [place] [address] Phone: [phone] Fax: [phone]     Social Determinants of Health (SDOH) Interventions    Readmission Risk Interventions No flowsheet data found.

## 2021-03-05 NOTE — Assessment & Plan Note (Signed)
-   2D echo done this hospitalization shows an EF of 35-40%.  Patient has a history of coronary artery disease, and CABG in 1999.  His most recent 2D echo in the system is in 2015 and EF at that time was 45 to 50% with diffuse hypokinesis.  Continue Lasix, does not appear overly fluid overloaded

## 2021-03-05 NOTE — Assessment & Plan Note (Signed)
-   Due to pneumonia as well as steroid use

## 2021-03-06 ENCOUNTER — Inpatient Hospital Stay (HOSPITAL_COMMUNITY): Payer: Medicare HMO

## 2021-03-06 DIAGNOSIS — J90 Pleural effusion, not elsewhere classified: Secondary | ICD-10-CM | POA: Diagnosis not present

## 2021-03-06 DIAGNOSIS — I5043 Acute on chronic combined systolic (congestive) and diastolic (congestive) heart failure: Secondary | ICD-10-CM

## 2021-03-06 DIAGNOSIS — I4891 Unspecified atrial fibrillation: Secondary | ICD-10-CM | POA: Diagnosis not present

## 2021-03-06 DIAGNOSIS — J9601 Acute respiratory failure with hypoxia: Secondary | ICD-10-CM

## 2021-03-06 DIAGNOSIS — J189 Pneumonia, unspecified organism: Secondary | ICD-10-CM | POA: Diagnosis not present

## 2021-03-06 DIAGNOSIS — K56600 Partial intestinal obstruction, unspecified as to cause: Secondary | ICD-10-CM | POA: Diagnosis not present

## 2021-03-06 DIAGNOSIS — J9602 Acute respiratory failure with hypercapnia: Secondary | ICD-10-CM

## 2021-03-06 LAB — BODY FLUID CELL COUNT WITH DIFFERENTIAL
Eos, Fluid: 0 %
Lymphs, Fluid: 71 %
Monocyte-Macrophage-Serous Fluid: 9 % — ABNORMAL LOW (ref 50–90)
Neutrophil Count, Fluid: 20 % (ref 0–25)
Total Nucleated Cell Count, Fluid: 230 cu mm (ref 0–1000)

## 2021-03-06 LAB — COMPREHENSIVE METABOLIC PANEL
ALT: 52 U/L — ABNORMAL HIGH (ref 0–44)
AST: 201 U/L — ABNORMAL HIGH (ref 15–41)
Albumin: 2.6 g/dL — ABNORMAL LOW (ref 3.5–5.0)
Alkaline Phosphatase: 79 U/L (ref 38–126)
Anion gap: 15 (ref 5–15)
BUN: 58 mg/dL — ABNORMAL HIGH (ref 8–23)
CO2: 19 mmol/L — ABNORMAL LOW (ref 22–32)
Calcium: 8.2 mg/dL — ABNORMAL LOW (ref 8.9–10.3)
Chloride: 100 mmol/L (ref 98–111)
Creatinine, Ser: 2.14 mg/dL — ABNORMAL HIGH (ref 0.61–1.24)
GFR, Estimated: 29 mL/min — ABNORMAL LOW (ref >60)
Glucose, Bld: 228 mg/dL — ABNORMAL HIGH (ref 70–99)
Potassium: 3.9 mmol/L (ref 3.5–5.1)
Sodium: 134 mmol/L — ABNORMAL LOW (ref 135–145)
Total Bilirubin: 0.2 mg/dL — ABNORMAL LOW (ref 0.3–1.2)
Total Protein: 6 g/dL — ABNORMAL LOW (ref 6.5–8.1)

## 2021-03-06 LAB — CBC
HCT: 29.1 % — ABNORMAL LOW (ref 39.0–52.0)
Hemoglobin: 9.2 g/dL — ABNORMAL LOW (ref 13.0–17.0)
MCH: 28.2 pg (ref 26.0–34.0)
MCHC: 31.6 g/dL (ref 30.0–36.0)
MCV: 89.3 fL (ref 80.0–100.0)
Platelets: 518 10*3/uL — ABNORMAL HIGH (ref 150–400)
RBC: 3.26 MIL/uL — ABNORMAL LOW (ref 4.22–5.81)
RDW: 17.3 % — ABNORMAL HIGH (ref 11.5–15.5)
WBC: 25.2 10*3/uL — ABNORMAL HIGH (ref 4.0–10.5)
nRBC: 0.2 % (ref 0.0–0.2)

## 2021-03-06 LAB — PROTEIN, PLEURAL OR PERITONEAL FLUID: Total protein, fluid: <3 g/dL

## 2021-03-06 LAB — LACTATE DEHYDROGENASE, PLEURAL OR PERITONEAL FLUID: LD, Fluid: 82 U/L — ABNORMAL HIGH (ref 3–23)

## 2021-03-06 LAB — TROPONIN I (HIGH SENSITIVITY)
Troponin I (High Sensitivity): 277 ng/L (ref <18)
Troponin I (High Sensitivity): 243 ng/L (ref <18)

## 2021-03-06 LAB — BRAIN NATRIURETIC PEPTIDE: B Natriuretic Peptide: 1169.7 pg/mL — ABNORMAL HIGH (ref 0.0–100.0)

## 2021-03-06 LAB — CULTURE, BLOOD (ROUTINE X 2)
Culture: NO GROWTH
Culture: NO GROWTH
Special Requests: ADEQUATE

## 2021-03-06 LAB — PROCALCITONIN: Procalcitonin: 2.27 ng/mL

## 2021-03-06 MED ORDER — ALBUTEROL SULFATE (2.5 MG/3ML) 0.083% IN NEBU: 2.5000 mg | INHALATION_SOLUTION | RESPIRATORY_TRACT | Status: DC | PRN

## 2021-03-06 MED ORDER — PANTOPRAZOLE SODIUM 40 MG PO TBEC
40.0000 mg | DELAYED_RELEASE_TABLET | Freq: Every day | ORAL | Status: DC
  Administered 2021-03-07 – 2021-03-11 (×5): 40 mg via ORAL
  Filled 2021-03-06 (×5): qty 1

## 2021-03-06 MED ORDER — FUROSEMIDE 10 MG/ML IJ SOLN
40.0000 mg | Freq: Once | INTRAMUSCULAR | Status: AC
  Administered 2021-03-06: 40 mg via INTRAVENOUS
  Filled 2021-03-06: qty 4

## 2021-03-06 MED ORDER — ENOXAPARIN SODIUM 60 MG/0.6ML IJ SOSY
60.0000 mg | PREFILLED_SYRINGE | INTRAMUSCULAR | Status: DC
  Administered 2021-03-06: 60 mg via SUBCUTANEOUS
  Filled 2021-03-06: qty 0.6

## 2021-03-06 NOTE — Procedures (Signed)
Thoracentesis  Procedure Note  Angel Cooper  [id number]  July 17, 1930  Date:03/06/21  Time:3:36 PM   Provider Performing:Brooke Moshe Cipro   Procedure: Thoracentesis with imaging guidance ([id number])  Indication(s) Pleural Effusion- RIGHT  Consent Risks of the procedure as well as the alternatives and risks of each were explained to the patient and/or caregiver.  Consent for the procedure was obtained and is signed in the bedside chart  Anesthesia Topical only with 1% lidocaine    Time Out Verified patient identification, verified procedure, site/side was marked, verified correct patient position, special equipment/implants available, medications/allergies/relevant history reviewed, required imaging and test results available.   Sterile Technique Maximal sterile technique including full sterile barrier drape, hand hygiene, sterile gown, sterile gloves, mask, hair covering, sterile ultrasound probe cover (if used).  Procedure Description Ultrasound was used to identify appropriate pleural anatomy for placement and overlying skin marked.  Area of drainage cleaned and draped in sterile fashion. Lidocaine was used to anesthetize the skin and subcutaneous tissue.  600 cc's of clear yellow appearing fluid was drained from the right pleural space. Catheter then removed and bandaid applied to site.   Complications/Tolerance None; patient tolerated the procedure well. Chest X-ray is ordered to confirm no post-procedural complication.   EBL Minimal   Specimen(s) Pleural fluid       Kennieth Rad, ACNP [facility] 03/06/2021, 3:36 PM  See Amion for pager If no response to pager, please call PCCM consult pager After 7:00 pm call Elink

## 2021-03-06 NOTE — Progress Notes (Signed)
PROGRESS NOTE    Angel Cooper  TJQ:[id number] DOB: [birth year] DOA: 03/01/2021 PCP: [name], MD   Chief Complaint  Patient presents with   Abdominal Pain    Brief Narrative:    88 year old male with CKD 3B, paroxysmal A. fib, history of renal resection due to [place], HTN, HLD comes into the hospital with abdominal pain for the past 3 days.  Apparently he was diagnosed with COVID as an outpatient about 2 weeks ago, was prescribed medication but never took them.  He saw his PCP a few days ago, had some respiratory symptoms and a chest x-ray showed pneumonia.  He then developed abdominal discomfort and came to the hospital.  He was found to have a small bowel obstruction, A. fib with RVR as well as left lobar pneumonia.  General surgery consulted.  He improved with conservative management and his NG tube was discontinued 2/6.  PT recommends SNF - as of 2/7, patient with progressive dyspnea, increased work of breathing, he did require NRB, transition during the day to 15 L high flow nasal cannula, work-up significant for volume overload, with large left> right pleural effusion.  Assessment & Plan:   Principal Problem:   Partial small bowel obstruction (HCC) Active Problems:   Atrial fibrillation with RVR (HCC)   CAD (coronary artery disease) of artery bypass graft   Chronic kidney disease, stage 3b (HCC)   Leukocytosis   Essential hypertension   Anemia of chronic disease   Personal history of other malignant neoplasm of kidney   CAP (community acquired pneumonia)   History of COVID-19   Right shoulder pain due to gout   Protein-calorie malnutrition, severe   Chronic combined systolic and diastolic heart failure (HCC)  Acute hypoxic respiratory failure -Overnight patient with severe dyspnea, significant hypoxia, requiring NRB, he received 80 mg IV Lasix with some improvement, but he is still on 15 L high flow nasal cannula. -This is most likely related to pleural effusion, most  likely in the setting of volume overload and CHF, but as well infectious etiology could be contributing. -We will try him on BiPAP as needed, as he is becoming more tired and tachypneic on high flow nasal cannula. -Please see discussion below under CHF and pleural effusion  Pleural effusion -CT chest this morning significant for bilateral pleural effusion, have consulted PCCM for thoracentesis, will change from Eliquis to Lovenox -Volume overload versus infectious etiology as being treated for pneumonia, PCCM to evaluate, will discuss if need to broaden antibiotic coverage.  Partial small bowel obstruction ([place])- (present on admission) - General surgery consulted and followed patient while hospitalized.  He was treated with conservative management with NG tube, IV fluids, n.p.o.  He improved, NG tube was discontinued 2/6 and his diet is slowly advanced.  He is doing better this morning -Tolerating soft diet, no nausea, no vomiting  Atrial fibrillation with RVR (HCC)- (present on admission) - Patient was started on Cardizem infusion while strict n.p.o.,, once his diet was advanced 2/6 he was placed on oral Cardizem 30 every 6.  He is off IV infusion, heart rate much better controlled.  Will consolidate and switch to 60 mg Q2 and potentially on discharge could go to 120 every 24 extended release.  He was on heparin infusion while n.p.o. and converted to Eliquis.  He was not on anticoagulation prior to admission.  I discussed with patient's son over the phone, they are not sure why he is not on anticoagulation but believe he may  have not "felt good" in the past but they do not remember exactly.  He has not had any falls and is otherwise active, somewhat limited by his knees.  Start Eliquis   CAP (community acquired pneumonia) -Started on antibiotics with ceftriaxone and azithromycin, continue for a total of 5 days.  Respiratory status is stable   AKI on Chronic kidney disease, stage 3b ([place])- (present  on admission) - Baseline creatinine around 1.5.  Currently at baseline.   -Continued up to 2.1 as he received IV Lasix overnight .   Essential hypertension- (present on admission) - Currently on furosemide, diltiazem.  Stable   Anemia of chronic disease- (present on admission) -Hemoglobin stable, no bleeding   Acute on Chronic combined systolic and diastolic heart failure ([place]) - 2D echo done this hospitalization shows an EF of 35-40%.  Patient has a history of coronary artery disease, and CABG in 1999.  His most recent 2D echo in the system is in 2015 and EF at that time was 45 to 50% with diffuse hypokinesis.  Continue Lasix, does not appear overly fluid overloaded   Right shoulder pain due to gout - Started on 2/5, patient reports history of gouty attack at various sites of the body, this reminds him of one.  Received 125 Solu-Medrol 2/6, started on colchicine, significantly better today, he can move his arm now.  Switch to prednisone do a short 4-day course   History of COVID-19 -Son at bedside, reports father tested positive for 1 /13 / 2023, he was prescribed oral antiviral by PCP but he did not take.  . -Patient is 21 days currently from his initial infection, no need for COVID isolation, I will discontinue.  .  Personal history of other malignant neoplasm of kidney - Noted   Leukocytosis - Due to pneumonia as well as steroid use   CAD (coronary artery disease) of artery bypass graft- (present on admission) - No chest pain, history of CABG in 1999  DVT prophylaxis: Eliquis>> lovenox Code Status: Full Family Communication: D/W son at bedside Disposition:   Status is: Inpatient            Consultants:  PCCM   Subjective:  Patient with significant dyspnea overnight, and increase of oxygen requirement, he reports dyspnea, cough,  Objective: Vitals:   03/06/21 0100 03/06/21 0409 03/06/21 0500 03/06/21 0833  BP: (!) 111/51 119/69  (!) 116/56  Pulse: (!) 104  89  (!) 102  Resp: (!) 21 19  (!) 25  Temp: 98 F (36.7 C) 98 F (36.7 C)    TempSrc:  Oral    SpO2: 94% 95%  92%  Weight:   63.7 kg   Height:        Intake/Output Summary (Last 24 hours) at 03/06/2021 1416 Last data filed at 03/05/2021 1840 Gross per 24 hour  Intake --  Output 500 ml  Net -500 ml   Filed Weights   03/03/21 0330 03/05/21 0433 03/06/21 0500  Weight: 58.2 kg 63 kg 63.7 kg    Examination:  Awake Alert, Oriented X 3, frail, deconditioned and chronically ill-appearing Symmetrical Chest wall movement, diminished air entry at the bases with bibasilar Rales, tachypneic with some increased work of breathing RRR,No Gallops,Rubs or new Murmurs, No Parasternal Heave +ve B.Sounds, Abd Soft, No tenderness, No rebound - guarding or rigidity. No Cyanosis, Clubbing or edema, No new Rash or bruise      Data Reviewed: I have personally reviewed following labs and imaging studies  CBC: Recent Labs  Lab 03/01/21 1753 03/02/21 0227 03/03/21 0206 03/04/21 0208 03/05/21 0049 03/06/21 0057  WBC 15.5* 14.3* 13.9* 17.8* 23.1* 25.2*  NEUTROABS 13.1*  --   --   --   --   --   HGB 11.3* 10.1* 9.9* 8.6* 9.5* 9.2*  HCT 35.4* 31.8* 30.5* 27.3* 29.5* 29.1*  MCV 88.5 89.3 88.9 90.4 90.5 89.3  PLT 686* 595* 572* 486* 492* 518*    Basic Metabolic Panel: Recent Labs  Lab 03/01/21 1753 03/02/21 0226 03/03/21 1147 03/04/21 0208 03/05/21 0049 03/06/21 0057  NA 133* 135 138 137 136 134*  K 3.8 4.1 3.8 3.5 4.5 3.9  CL 97* 99 100 104 102 100  CO2 24 24 22 22  21* 19*  GLUCOSE 118* 111* 88 71 218* 228*  BUN 31* 27* 24* 26* 40* 58*  CREATININE 1.51* 1.38* 1.61* 1.58* 1.69* 2.14*  CALCIUM 8.7* 8.2* 8.6* 8.0* 8.4* 8.2*  MG 1.6* 2.0  --   --   --   --     GFR: Estimated Creatinine Clearance: 19.6 mL/min (A) (by C-G formula based on SCr of 2.14 mg/dL (H)).  Liver Function Tests: Recent Labs  Lab 03/01/21 1753 03/02/21 0226 03/06/21 0057  AST 20 15 201*  ALT 6 7 52*   ALKPHOS 71 61 79  BILITOT 0.8 0.4 0.2*  PROT 6.6 5.7* 6.0*  ALBUMIN 3.1* 2.6* 2.6*    CBG: No results for input(s): GLUCAP in the last 168 hours.   Recent Results (from the past 240 hour(s))  Blood culture (routine x 2)     Status: None   Collection Time: 03/01/21  7:00 PM   Specimen: BLOOD RIGHT ARM  Result Value Ref Range Status   Specimen Description BLOOD RIGHT ARM  Final   Special Requests   Final    BOTTLES DRAWN AEROBIC AND ANAEROBIC Blood Culture results may not be optimal due to an excessive volume of blood received in culture bottles   Culture   Final    NO GROWTH 5 DAYS Performed at [facility], [place] [address], [place], [place] [address]    Report Status 03/06/2021 FINAL  Final  Blood culture (routine x 2)     Status: None   Collection Time: 03/01/21  7:05 PM   Specimen: BLOOD LEFT ARM  Result Value Ref Range Status   Specimen Description BLOOD LEFT ARM  Final   Special Requests   Final    BOTTLES DRAWN AEROBIC AND ANAEROBIC Blood Culture adequate volume   Culture   Final    NO GROWTH 5 DAYS Performed at [facility], [place] [address], [place], [place] [address]    Report Status 03/06/2021 FINAL  Final  Respiratory (~20 pathogens) panel by PCR     Status: None   Collection Time: 03/01/21  8:11 PM   Specimen: Nasopharyngeal Swab; Respiratory  Result Value Ref Range Status   Adenovirus NOT DETECTED NOT DETECTED Final   Coronavirus 229E NOT DETECTED NOT DETECTED Final    Comment: (NOTE) The Coronavirus on the Respiratory Panel, DOES NOT test for the novel  Coronavirus (2019 nCoV)    Coronavirus HKU1 NOT DETECTED NOT DETECTED Final   Coronavirus NL63 NOT DETECTED NOT DETECTED Final   Coronavirus OC43 NOT DETECTED NOT DETECTED Final   Metapneumovirus NOT DETECTED NOT DETECTED Final   Rhinovirus / Enterovirus NOT DETECTED NOT DETECTED Final   Influenza A NOT DETECTED NOT DETECTED Final   Influenza B NOT  DETECTED NOT DETECTED Final   Parainfluenza  Virus 1 NOT DETECTED NOT DETECTED Final   Parainfluenza Virus 2 NOT DETECTED NOT DETECTED Final   Parainfluenza Virus 3 NOT DETECTED NOT DETECTED Final   Parainfluenza Virus 4 NOT DETECTED NOT DETECTED Final   Respiratory Syncytial Virus NOT DETECTED NOT DETECTED Final   Bordetella pertussis NOT DETECTED NOT DETECTED Final   Bordetella Parapertussis NOT DETECTED NOT DETECTED Final   Chlamydophila pneumoniae NOT DETECTED NOT DETECTED Final   Mycoplasma pneumoniae NOT DETECTED NOT DETECTED Final    Comment: Performed at [facility], [place] [address] [place], [place] [address]  Resp Panel by RT-PCR (Flu A&B, Covid) Nasopharyngeal Swab     Status: Abnormal   Collection Time: 03/02/21  3:12 AM   Specimen: Nasopharyngeal Swab; Nasopharyngeal(NP) swabs in vial transport medium  Result Value Ref Range Status   SARS Coronavirus 2 by RT PCR POSITIVE (A) NEGATIVE Final    Comment: (NOTE) SARS-CoV-2 target nucleic acids are DETECTED.  The SARS-CoV-2 RNA is generally detectable in upper respiratory specimens during the acute phase of infection. Positive results are indicative of the presence of the identified virus, but do not rule out bacterial infection or co-infection with other pathogens not detected by the test. Clinical correlation with patient history and other diagnostic information is necessary to determine patient infection status. The expected result is Negative.  Fact Sheet for Patients: EntrepreneurPulse.com.au  Fact Sheet for Healthcare Providers: IncredibleEmployment.be  This test is not yet approved or cleared by the [place] FDA and  has been authorized for detection and/or diagnosis of SARS-CoV-2 by FDA under an Emergency Use Authorization (EUA).  This EUA will remain in effect (meaning this test can be used) for the duration of  the COVID-19 declaration under Section 564(b)(1) of the A ct, 21 U.S.C. section 360bbb-3(b)(1),  unless the authorization is terminated or revoked sooner.     Influenza A by PCR NEGATIVE NEGATIVE Final   Influenza B by PCR NEGATIVE NEGATIVE Final    Comment: (NOTE) The Xpert Xpress SARS-CoV-2/FLU/RSV plus assay is intended as an aid in the diagnosis of influenza from Nasopharyngeal swab specimens and should not be used as a sole basis for treatment. Nasal washings and aspirates are unacceptable for Xpert Xpress SARS-CoV-2/FLU/RSV testing.  Fact Sheet for Patients: EntrepreneurPulse.com.au  Fact Sheet for Healthcare Providers: IncredibleEmployment.be  This test is not yet approved or cleared by the [place] FDA and has been authorized for detection and/or diagnosis of SARS-CoV-2 by FDA under an Emergency Use Authorization (EUA). This EUA will remain in effect (meaning this test can be used) for the duration of the COVID-19 declaration under Section 564(b)(1) of the Act, 21 U.S.C. section 360bbb-3(b)(1), unless the authorization is terminated or revoked.  Performed at [facility], [place] [address] [place], [place] [address]          Radiology Studies: CT CHEST WO CONTRAST  Result Date: 03/06/2021 CLINICAL DATA:  Hypoxia, COVID versus bacterial pneumonia, volume overload. EXAM: CT CHEST WITHOUT CONTRAST TECHNIQUE: Multidetector CT imaging of the chest was performed following the standard protocol without IV contrast. RADIATION DOSE REDUCTION: This exam was performed according to the departmental dose-optimization program which includes automated exposure control, adjustment of the mA and/or kV according to patient size and/or use of iterative reconstruction technique. COMPARISON:  CT abdomen pelvis 03/01/2021 and CT chest 05/09/2016. FINDINGS: Cardiovascular: Atherosclerotic calcification of the aorta with a probable surgical plaque in the transverse portion. Aortic valvular calcification.  Pulmonic trunk and heart are enlarged. No  pericardial effusion. Mediastinum/Nodes: Mediastinal lymph nodes are not enlarged by CT size criteria. Hilar regions are difficult to evaluate without IV contrast. No axillary adenopathy. Moderate to large hiatal hernia. Esophagus is otherwise grossly unremarkable. Lungs/Pleura: Patchy bilateral ground-glass and septal thickening with some associated consolidation. Large bilateral pleural effusions with collapse/consolidation in the left lower lobe. Mild central bronchiectasis. Airway is unremarkable. Upper Abdomen: Visualized portions of the liver, adrenal glands, left kidney, spleen and pancreas are unremarkable. Moderate to large hiatal hernia. Minimally distended fluid-filled loops of proximal small bowel. Small ascites. Musculoskeletal: Degenerative changes in the spine no worrisome lytic or sclerotic lesions. IMPRESSION: 1. Diffuse patchy ground-glass, septal thickening and scattered consolidation, findings suggestive of COVID-19 pneumonia. 2. Large bilateral pleural effusions with left lower lobe collapse/consolidation, possibly a combination of atelectasis and pneumonia. 3. Mild central bronchiectasis. 4. Moderate to large hiatal hernia. 5. Mildly distended fluid-filled loops of small bowel in the left abdomen, compatible with the history of small-bowel obstruction on 03/01/2021. 6. Small ascites. 7.  Aortic atherosclerosis (ICD10-I70.0). 8. Enlarged pulmonic trunk, indicative of pulmonary arterial hypertension. Electronically Signed   By: Lorin Picket M.D.   On: 03/06/2021 10:55   DG CHEST PORT 1 VIEW  Result Date: 03/06/2021 CLINICAL DATA:  Dyspnea EXAM: PORTABLE CHEST 1 VIEW COMPARISON:  03/01/2021 FINDINGS: Lung volumes are slightly small though pulmonary insufflation remain stable since prior examination. Superimposed multifocal pulmonary consolidation has progressed in the interval since prior examination, particular within the right upper and lower lung zones. Small to moderate left and small  right pleural effusions have developed. No pneumothorax. Coronary artery bypass grafting has been performed. Mild cardiomegaly is stable. Moderate hiatal hernia again noted. IMPRESSION: Stable pulmonary hypoinflation. Progressive multifocal pulmonary consolidation in keeping with multifocal pneumonia in the appropriate clinical setting. Interval development of bilateral pleural effusions, left greater than right. Stable cardiomegaly. Moderate hiatal hernia. Electronically Signed   By: Fidela Salisbury M.D.   On: 03/06/2021 01:25        Scheduled Meds:  allopurinol  50 mg Oral Daily   apixaban  2.5 mg Oral BID   colchicine  0.3 mg Oral Daily   diltiazem  60 mg Oral Q12H   docusate sodium  100 mg Oral BID   feeding supplement  237 mL Oral TID BM   furosemide  80 mg Oral Daily   mouth rinse  15 mL Mouth Rinse BID   multivitamin  1 tablet Oral QHS   polyethylene glycol  17 g Oral Daily   predniSONE  40 mg Oral Q breakfast   Continuous Infusions:  cefTRIAXone (ROCEPHIN)  IV 2 g (03/05/21 2101)     LOS: 5 days        Phillips Climes, MD [facility]   To contact the attending provider between 7A-7P or the covering provider during after hours 7P-7A, please log into the web site [email or web address] and access using universal [place] password for that web site. If you do not have the password, please call the hospital operator.  03/06/2021, 2:16 PM

## 2021-03-06 NOTE — Progress Notes (Signed)
RT note. Patient placed on 15L salter HFNC at this time. Patient sat 92%, RT will continue to monitor, RN aware

## 2021-03-06 NOTE — Plan of Care (Signed)

## 2021-03-06 NOTE — Procedures (Signed)
Thoracentesis  Procedure Note  ASHANTE SNELLING  [id number]  [birth year]  Date:03/06/21  Time:5:06 PM   Provider Performing:Julien Oscar C Tamala Julian   Procedure: Thoracentesis with imaging guidance ([id number])  Indication(s) Pleural Effusion  Consent Risks of the procedure as well as the alternatives and risks of each were explained to the patient and/or caregiver.  Consent for the procedure was obtained and is signed in the bedside chart  Anesthesia Topical only with 1% lidocaine    Time Out Verified patient identification, verified procedure, site/side was marked, verified correct patient position, special equipment/implants available, medications/allergies/relevant history reviewed, required imaging and test results available.   Sterile Technique Maximal sterile technique including full sterile barrier drape, hand hygiene, sterile gown, sterile gloves, mask, hair covering, sterile ultrasound probe cover (if used).  Procedure Description Ultrasound was used to identify appropriate pleural anatomy for placement and overlying skin marked.  Area of drainage cleaned and draped in sterile fashion. Lidocaine was used to anesthetize the skin and subcutaneous tissue.  500 cc's of straw appearing fluid was drained from the left pleural space. Catheter then removed and bandaid applied to site.   Complications/Tolerance None; patient tolerated the procedure well. Chest X-ray is ordered to confirm no post-procedural complication.   EBL Minimal   Specimen(s) Pleural fluid

## 2021-03-06 NOTE — Progress Notes (Signed)
RT called to assess patient. Patient on NRB mask at 92%. Hr: 111, RR:21. Patient moved up in bed with RNs assistance and then propped his left side up to try and open his left lung up some. Patient has decreased breath sounds and the left lung is very decreased. RN paging MD.

## 2021-03-06 NOTE — Progress Notes (Signed)
Assisted Dr Tamala Julian and Jerene Pitch NP with bilateral thoracentesis.  Time out at 1510.  Patient tolerated well.  Able to wean O2 to 10 L post procedure.  Awaiting PCXR

## 2021-03-06 NOTE — Consult Note (Signed)
NAME:  Angel Cooper, MRN:  [id number], DOB:  23-Dec-1930, LOS: 5 ADMISSION DATE:  03/01/2021, CONSULTATION DATE:  03/06/21 REFERRING MD:  TRH, CHIEF COMPLAINT:  Hypoxic respiratory failure   History of Present Illness:   88 year old male with prior history of PAF, CAD s/p CABG, systolic and diastolic HF (EF 65-79%), CKD stage IIIb, renal cell carcinoma, HTN, HLD, and GERD.  Presented on 2/3 with 3-4 day history of abdominal pain.  Found on workup to have a partial small bowel obstruction, left lower lobe pneumonia, and afib with RVR.  He was admitted to [facility] and general surgery was consulted.  SBO managed medically.  He was treated for CAP with ceftriaxone and azithromax.  Also found to be positive for COVID two weeks prior to admit, son reports tested positive on 1/13 and was given medications for but never took them.  He had prior hospitalization 1/3> 1/7 for knee pain and gout.  Also recently treated for pneumonia by his PCP ~2/2.   His SBO improved with bowel rest and NGT.  NGT removed 2/6 and diet advanced with bowel movement on 2/7.  He required cardizem gtt for rate control transitioned to oral on 2/7 and transitioned from heparin gtt to eliquis on 2/7.  Overnight on 2/7, he had increasing O2 requirements overnight 2/7 from 3-4L [place] to salter HFNC with increasing work of breathing.  BNP noted at 1169, with mildly elevated troponins, 277> 243.  PCT 2.27Underwent CT chest 2/8 which showed large bilateral pleural effusions, L> R, along with diffuse patchy GGOs and scattered consolidation, and mild central bronchiectasis.  Pulmonary consulted for further recommendations.    Patient denies any fever/ chills.  Had a great day on 2/7 but endorses worsening SOB since yesterday around dinner.  Occasionally has a productive cough, but he swallows it.  No other complaints.    Pertinent  Medical History  PAF, CAD s/p CABG, systolic and diastolic HF (EF 03-83%), CKD stage IIIb, renal cell carcinoma, HTN, HLD,  and GERD hiatal hernia, gout   Significant Hospital Events: Including procedures, antibiotic start and stop dates in addition to other pertinent events   2/3 admitted TRH, CCS consulting 2/6 NGT out, diet progressed, diuresing 2/7 heparin transitioned to Eliquis, increasing O2 requirements overnight 2/8 CT chest, Pulmonary consult   2/8 CT chest wo >> 1. Diffuse patchy ground-glass, septal thickening and scattered consolidation, findings suggestive of COVID-19 pneumonia. 2. Large bilateral pleural effusions with left lower lobe collapse/consolidation, possibly a combination of atelectasis and pneumonia. 3. Mild central bronchiectasis. 4. Moderate to large hiatal hernia. 5. Mildly distended fluid-filled loops of small bowel in the left abdomen, compatible with the history of small-bowel obstruction on 03/01/2021. 6. Small ascites. 7.  Aortic atherosclerosis (ICD10-I70.0). 8. Enlarged pulmonic trunk, indicative of pulmonary arterial hypertension.  Interim History / Subjective:  Sitting upright in bed eating lunch.  Son at bedside.    Objective   Blood pressure (!) 116/56, pulse (!) 102, temperature 98 F (36.7 C), temperature source Oral, resp. rate (!) 25, height _0  (1.626 m), weight 63.7 kg, SpO2 92 %.        Intake/Output Summary (Last 24 hours) at 03/06/2021 1426 Last data filed at 03/05/2021 1840 Gross per 24 hour  Intake --  Output 500 ml  Net -500 ml   Filed Weights   03/03/21 0330 03/05/21 0433 03/06/21 0500  Weight: 58.2 kg 63 kg 63.7 kg   Examination: General:  Pleasant frail and elderly male  sitting upright in bed in NAD HEENT: MM pink/moist Neuro: Alert/ oriented, MAE CV: irir PULM:  some increased accessory muscle use, diffuse rales, diminished left base, non productive cough GI: soft, bs+, NT, ND, condom cath  Extremities: warm/dry, no LE edema  Skin: no rashes   Resolved Hospital Problem list    Assessment & Plan:   Acute hypoxic respiratory failure-  multifactorial in the setting of acute on chronic HF exacerbation, CAP, and recent COVID CAP- s/p 5 day course of azithro/ ceftriaxone, completed 2/7 Bilateral pleural effusions COVID pna - reports initial test positive 02/08/21 P:  - Cont supplemental O2 for sat goal > 90%.  Transition to [place] instead of salter/BiPAP.  Likely to tolerate better.   - plans for bilateral thoracentesis and will send fluid for cultures and pleural studies, fluid felt most likely to be transudative given HF, recent NPO/ poor nutrition, and third spacing - send sputum for culture if he can produce one - residual findings on CT likely residual inflammatory from COVID  - cont aggressive pulm hygiene w/ IS, adding flutter, and continue to mobilize with PT - also in differential, is PE given sudden change in O2 requirements 2/7.  We will check LE doppler studies for completeness.  Defer CTA PE given AKI and will not change treatment course as he was started on Eliquis 2/7 - diurese as tolerated - Pulmonary will continue to follow.   - hold on restarting abx.  PCT elevated, trend.  WBC trend stable.  Remains afebrile.     Remainder per primary:   pSBO- resolved  Afib with RVR- cardizem, eliquis  Acute on chronic systolic/ diastolic HF- diurese as able  AKI on CKD stage 3b HTN Anemia of chronic disease Gout- started on prednisone 2/7  Best Practice (right click and "Reselect all SmartList Selections" daily)   Diet/type: Regular consistency (see orders) DVT prophylaxis: DOAC GI prophylaxis: PPI Lines: N/A Foley:  N/A Code Status:  full code Last date of multidisciplinary goals of care discussion [per primary]  Labs   CBC: Recent Labs  Lab 03/01/21 1753 03/02/21 0227 03/03/21 0206 03/04/21 0208 03/05/21 0049 03/06/21 0057  WBC 15.5* 14.3* 13.9* 17.8* 23.1* 25.2*  NEUTROABS 13.1*  --   --   --   --   --   HGB 11.3* 10.1* 9.9* 8.6* 9.5* 9.2*  HCT 35.4* 31.8* 30.5* 27.3* 29.5* 29.1*  MCV 88.5 89.3  88.9 90.4 90.5 89.3  PLT 686* 595* 572* 486* 492* 518*    Basic Metabolic Panel: Recent Labs  Lab 03/01/21 1753 03/02/21 0226 03/03/21 1147 03/04/21 0208 03/05/21 0049 03/06/21 0057  NA 133* 135 138 137 136 134*  K 3.8 4.1 3.8 3.5 4.5 3.9  CL 97* 99 100 104 102 100  CO2 _0 21* 19*  GLUCOSE 118* 111* 88 71 218* 228*  BUN 31* 27* 24* 26* 40* 58*  CREATININE 1.51* 1.38* 1.61* 1.58* 1.69* 2.14*  CALCIUM 8.7* 8.2* 8.6* 8.0* 8.4* 8.2*  MG 1.6* 2.0  --   --   --   --    GFR: Estimated Creatinine Clearance: 19.6 mL/min (A) (by C-G formula based on SCr of 2.14 mg/dL (H)). Recent Labs  Lab 03/01/21 1753 03/01/21 2053 03/02/21 0227 03/03/21 0206 03/04/21 0208 03/05/21 0049 03/06/21 0057 03/06/21 0402  PROCALCITON  --   --   --   --   --   --   --  2.27  WBC 15.5*  --    < >  13.9* 17.8* 23.1* 25.2*  --   LATICACIDVEN 2.0* 1.7  --   --   --   --   --   --    < > = values in this interval not displayed.    Liver Function Tests: Recent Labs  Lab 03/01/21 1753 03/02/21 0226 03/06/21 0057  AST 20 15 201*  ALT 6 7 52*  ALKPHOS 71 61 79  BILITOT 0.8 0.4 0.2*  PROT 6.6 5.7* 6.0*  ALBUMIN 3.1* 2.6* 2.6*   Recent Labs  Lab 03/01/21 1753  LIPASE 24   No results for input(s): AMMONIA in the last 168 hours.  ABG    Component Value Date/Time   TCO2 31 06/02/2016 1136     Coagulation Profile: No results for input(s): INR, PROTIME in the last 168 hours.  Cardiac Enzymes: No results for input(s): CKTOTAL, CKMB, CKMBINDEX, TROPONINI in the last 168 hours.  HbA1C: No results found for: HGBA1C  CBG: No results for input(s): GLUCAP in the last 168 hours.  Review of Systems:   Review of Systems  Constitutional:  Positive for malaise/fatigue. Negative for chills and fever.  Respiratory:  Positive for cough, sputum production and shortness of breath. Negative for hemoptysis and wheezing.   Cardiovascular:  Negative for chest pain and leg swelling.   Gastrointestinal:  Negative for abdominal pain, nausea and vomiting.  Neurological:  Negative for focal weakness and loss of consciousness.   Past Medical History:  He,  has a past medical history of Atrial fibrillation [facility]), Bladder tumor, CHF (congestive heart failure) ([place]) (2015), CKD (chronic kidney disease), stage III ([place]), Coronary artery disease, GERD (gastroesophageal reflux disease), Hiatal hernia, History of colon polyps, History of gastric ulcer, History of MI (myocardial infarction), History of non-ST elevation myocardial infarction (NSTEMI), Hyperlipidemia, Hypertension, OA (osteoarthritis), Osteomyelitis ([place]) (12/07/2018), S/P CABG (coronary artery bypass graft), and Ureteral carcinoma, right [facility]) (urologist-  dr herrick/  oncologist- dr [name]).   Surgical History:   Past Surgical History:  Procedure Laterality Date   AMPUTATION FINGER Left 12/07/2018   Procedure: Amputation Left Long Finger;  Surgeon: Roseanne Kaufman, MD;  Location: [place];  Service: Orthopedics;  Laterality: Left;   CARDIAC CATHETERIZATION  07/05/1999   non-q MI;  3vessel CAD w/ high grade pLAD and mLAD, subtotal ramus branch,  OM1 70-80%,  ostialCFX 50-60%,  dCFx 80%,  RI 99%,  total occluded pRCA and fills last by right-to-right collaterals,  pLM  20% narrowing;  severe inferoapical hypokinesis   CATARACT EXTRACTION W/ INTRAOCULAR LENS  IMPLANT, BILATERAL     CORONARY ARTERY BYPASS GRAFT  06/ 2001  dr [name] tright   CYSTOSCOPY W/ RETROGRADES Left 01/31/2016   Procedure: CYSTOSCOPY WITH RETROGRADE PYELOGRAM;  Surgeon: [name], MD;  Location: [facility];  Service: Urology;  Laterality: Left;   CYSTOSCOPY WITH RETROGRADE PYELOGRAM, URETEROSCOPY AND STENT PLACEMENT Right 12/03/2012   Procedure: CYSTOSCOPY WITH RIGHT RETROGRADE PYELOGRAM,RIGHT URETERAL WASHINGS, RIGHT URETERAL BIOPSY, RIGHT URETEROSCOPY AND STENT PLACEMENT;  Surgeon: [name], MD;  Location: [facility];  Service:  Urology;  Laterality: Right;   CYSTOSCOPY WITH RETROGRADE PYELOGRAM, URETEROSCOPY AND STENT PLACEMENT Right 12/15/2012   Procedure: CYSTOSCOPY WITH RIGHT RETROGRADE PYELOGRAM, POSSIBLE RIGHT URETEROSCOPY AND STENT PLACEMENT AND REMOVAL OF LEFT STENT;  Surgeon: [name], MD;  Location: [facility];  Service: Urology;  Laterality: Right;   CYSTOSCOPY WITH STENT PLACEMENT Right 01/24/2013   Procedure: CYSTOSCOPY WITH STENT PLACEMENT;  Surgeon: [name], MD;  Location:  [facility];  Service: Urology;  Laterality: Right;   I & D EXTREMITY Left 12/07/2018   Procedure: IRRIGATION AND DEBRIDEMENT EXTREMITY;  Surgeon: Roseanne Kaufman, MD;  Location: [place];  Service: Orthopedics;  Laterality: Left;   INCISION AND DRAINAGE ABSCESS Left 06/02/2016   Procedure: INCISION AND DRAINAGE LEFT SMALL FINGER;  Surgeon: [name], MD;  Location: [place];  Service: Orthopedics;  Laterality: Left;   ROBOT ASSITED LAPAROSCOPIC NEPHROURETERECTOMY Right 01/24/2013   Procedure: ROBOT ASSISTED LAPAROSCOPIC NEPHROURETERECTOMY WITH BILATERAL LYMPH NODE DISSECTION ;  Surgeon: [name], MD;  Location: [facility];  Service: Urology;  Laterality: Right;  right kidney   TRANSTHORACIC ECHOCARDIOGRAM  01/28/2013   mild LVH,  ef 45-50%, diffuse hypokinesis,  grade 1 diastolic dysfunction/  moderate LAE/  trivial PR/  mild TR, PASP 50-48mHg   TRANSURETHRAL RESECTION OF BLADDER TUMOR N/A 01/31/2016   Procedure: TRANSURETHRAL RESECTION OF BLADDER TUMOR (TURBT);  Surgeon: BArdis Hughs MD;  [facility]  Service: Urology;  Laterality: N/A;     Social History:   reports that he quit smoking about 63 years ago. His smoking use included cigarettes. He has never used smokeless tobacco. He reports that he does not currently use alcohol. He reports that he does not use drugs.   Family History:  His family history is not on file.   Allergies Allergies  Allergen Reactions    Aspirin Other (See Comments)    Stomach pain    Chlorhexidine Other (See Comments)    CAUSED SKIN IRRITATION AND BURNING SENSATION   Enalapril Other (See Comments)    Cannot take due to kidney compromise   Gabapentin Other (See Comments)    Caused nightmares   Statins Other (See Comments)    Myalgia     Home Medications  Prior to Admission medications   Medication Sig Start Date End Date Taking? Authorizing Provider  acetaminophen (TYLENOL) 500 MG tablet Take 1,000 mg by mouth every 6 (six) hours as needed for mild pain or headache.    Yes [provider]  allopurinol (ZYLOPRIM) 100 MG tablet Take 0.5 tablets (50 mg total) by mouth daily. 02/01/21  Yes GDwyane Dee MD  ALPRAZolam (Duanne Moron 0.25 MG tablet Take 0.25 mg by mouth at bedtime as needed for sleep.  11/19/18  Yes [provider]  amLODipine (NORVASC) 10 MG tablet Take 10 mg by mouth daily. 02/21/21  Yes [provider]  amoxicillin (AMOXIL) 500 MG capsule Take 500 mg by mouth 2 (two) times daily. 02/28/21  Yes [provider]  bisacodyl (FLEET) 10 MG/30ML ENEM Place 10 mg rectally once.   Yes [provider]  Cyanocobalamin (B-12 COMPLIANCE INJECTION) 1000 MCG/ML KIT Inject 1,000 mcg as directed every 30 (thirty) days.   Yes [provider]  doxycycline (VIBRA-TABS) 100 MG tablet Take 100 mg by mouth 2 (two) times daily. 02/28/21  Yes [provider]  furosemide (LASIX) 40 MG tablet Take 80 mg by mouth every morning.    Yes [provider]  HYDROcodone-acetaminophen (NORCO/VICODIN) 5-325 MG tablet Take 1 tablet by mouth every 6 (six) hours as needed for severe pain. 02/01/21  Yes GDwyane Dee MD  sennosides-docusate sodium (SENOKOT-S) 8.6-50 MG tablet Take 2 tablets by mouth daily.   Yes [provider]  traMADol (ULTRAM) 50 MG tablet Take 50 mg by mouth every 6 (six) hours as needed for severe pain. 02/21/21  Yes [provider]     Critical  care time: n/a       Kennieth Rad, ACNP [facility] 03/06/2021, 4:14 PM  See Amion for pager If no response to pager, please call PCCM consult pager After 7:00 pm call Elink

## 2021-03-06 NOTE — Significant Event (Signed)
Patient's nurse notified me that patient became acutely short of breath and had to be placed on nonrebreather.  Ordered a stat chest x-ray which shows bilateral pleural effusion and multifocal pneumonia.  Patient also was in A-fib with RVR with systolic blood pressure 642/90 pulse 109/min temperature 98.  Respiration was 24/min.  I have reviewed patient's medications and notes and labs.  I ordered stat Lasix 40 mg IV and exam at bedside after Lasix patient's respirations improved and patient states he feels better.  High sensitive troponin came back as 277 and BNP was 1100.  We will get EKG and the time of my exam patient did not have any chest pain.  Will check serial troponins.  Continue to monitor closely.  Gean Birchwood

## 2021-03-06 NOTE — Progress Notes (Signed)
PT Cancellation Note  Patient Details Name: Angel Cooper MRN: [id number] DOB: 08-16-1930   Cancelled Treatment:    Reason Eval/Treat Not Completed: Medical issues which prohibited therapy- Pt. Developed a-fib with RVR and SOB awaiting further workup. PT to check back when more medically appropriate.   Thermon Leyland, SPT Acute Rehab Services   Thermon Leyland 03/06/2021, 8:49 AM

## 2021-03-06 NOTE — Progress Notes (Addendum)
[place] for IV apixaban -> Lovenox Indication: atrial fibrillation  Patient Measurements: Height: 5\' 4"  (162.6 cm) Weight: 63.7 kg (140 lb 6.9 oz) IBW/kg (Calculated) : 59.2  Vital Signs: Temp: 98 F (36.7 C) (02/08 0409) Temp Source: Oral (02/08 0409) BP: 116/56 (02/08 0833) Pulse Rate: 102 (02/08 0833)  Labs: Recent Labs    03/04/21 0208 03/04/21 1335 03/05/21 0049 03/05/21 0801 03/06/21 0057 03/06/21 0402  HGB 8.6*  --  9.5*  --  9.2*  --   HCT 27.3*  --  29.5*  --  29.1*  --   PLT 486*  --  492*  --  518*  --   APTT 103*  --   --   --   --   --   HEPARINUNFRC 0.45 0.56  --  0.48  --   --   CREATININE 1.58*  --  1.69*  --  2.14*  --   TROPONINIHS  --   --   --   --  277* 243*     Estimated Creatinine Clearance: 19.6 mL/min (A) (by C-G formula based on SCr of 2.14 mg/dL (H)).  Assessment: 88 yo M presents with small bowel obstruction. PMH significant for Afib. No PTA AC. CHADs2-VASC 4. Pharmacy consulted to transition to Lovenox from apixaban in anticipation of a thoracentesis. Last apixaban dose today at 09:18. No bleeding noted, Hgb low stable 8-9s, platelets are elevated. AKI on CKD noted, SCr up 2.14 and CrCl < 30 ml/min.  Goal of Therapy:  Anti-Xa level 1-2 units/ml 4hrs after LMWH dose given Monitor platelets by anticoagulation protocol: Yes   Plan:  Lovenox 60 mg SQ q24h - 1st dose at 22:00 CBC q72h while on Lovenox Monitor for s/sx of bleeding  Thank you for involving pharmacy in this patient's care.  Renold Genta, PharmD, BCPS Clinical Pharmacist Clinical phone for 03/06/2021 until 10p is [phone] 03/06/2021 3:00 PM  **Pharmacist phone directory can be found on [email or web address] listed under [place]**

## 2021-03-06 NOTE — Progress Notes (Signed)
RT note. Patient currently on 15L [place] sat anywhere from 90-92%, no labored breathing noted. Patient can also place NRB on top of HFNC if starts to desat. No need for bipap at this time. Will place patient on bipap if needed later. RT will continue to monitor.,

## 2021-03-06 NOTE — Progress Notes (Signed)
°   03/06/21 0040  Assess: MEWS Score  Temp 98 F (36.7 C)  BP 129/79  Pulse Rate (!) 109  ECG Heart Rate (!) 110  Resp (!) 22  Level of Consciousness Alert  SpO2 93 %  O2 Device Non-rebreather Mask  O2 Flow Rate (L/min) 15 L/min  Assess: MEWS Score  MEWS Temp 0  MEWS Systolic 0  MEWS Pulse 1  MEWS RR 1  MEWS LOC 0  MEWS Score 2  MEWS Score Color Yellow  Assess: if the MEWS score is Yellow or Red  Were vital signs taken at a resting state? Yes  Focused Assessment No change from prior assessment  Early Detection of Sepsis Score *See Row Information* Medium  MEWS guidelines implemented *See Row Information* Yes  Treat  MEWS Interventions Escalated (See documentation below)  Pain Scale 0-10  Pain Score 0  Take Vital Signs  Increase Vital Sign Frequency  Yellow: Q 2hr X 2 then Q 4hr X 2, if remains yellow, continue Q 4hrs  Escalate  MEWS: Escalate Yellow: discuss with charge nurse/RN and consider discussing with provider and RRT  Notify: Charge Nurse/RN  Name of Charge Nurse/RN Notified Genella Mech, RN  Date Charge Nurse/RN Notified 03/06/21  Time Charge Nurse/RN Notified 1937  Notify: Provider  Provider Name/Title Delford Field  Date Provider Notified 03/06/21  Time Provider Notified 0040  Notification Type Page  Notification Reason Other (Comment) (increased oxygen demand)  Provider response See new orders  Date of Provider Response 03/06/21  Time of Provider Response [phone]  Document  Patient Outcome Stabilized after interventions  Progress note created (see row info) Yes

## 2021-03-07 ENCOUNTER — Inpatient Hospital Stay (HOSPITAL_COMMUNITY): Payer: Medicare HMO

## 2021-03-07 DIAGNOSIS — J9691 Respiratory failure, unspecified with hypoxia: Secondary | ICD-10-CM

## 2021-03-07 LAB — BASIC METABOLIC PANEL
Anion gap: 9 (ref 5–15)
BUN: 59 mg/dL — ABNORMAL HIGH (ref 8–23)
CO2: 23 mmol/L (ref 22–32)
Calcium: 8 mg/dL — ABNORMAL LOW (ref 8.9–10.3)
Chloride: 104 mmol/L (ref 98–111)
Creatinine, Ser: 1.74 mg/dL — ABNORMAL HIGH (ref 0.61–1.24)
GFR, Estimated: 37 mL/min — ABNORMAL LOW (ref >60)
Glucose, Bld: 122 mg/dL — ABNORMAL HIGH (ref 70–99)
Potassium: 3.4 mmol/L — ABNORMAL LOW (ref 3.5–5.1)
Sodium: 136 mmol/L (ref 135–145)

## 2021-03-07 LAB — CBC
HCT: 25.3 % — ABNORMAL LOW (ref 39.0–52.0)
Hemoglobin: 8.4 g/dL — ABNORMAL LOW (ref 13.0–17.0)
MCH: 29.3 pg (ref 26.0–34.0)
MCHC: 33.2 g/dL (ref 30.0–36.0)
MCV: 88.2 fL (ref 80.0–100.0)
Platelets: 370 10*3/uL (ref 150–400)
RBC: 2.87 MIL/uL — ABNORMAL LOW (ref 4.22–5.81)
RDW: 17.2 % — ABNORMAL HIGH (ref 11.5–15.5)
WBC: 18.1 10*3/uL — ABNORMAL HIGH (ref 4.0–10.5)
nRBC: 0 % (ref 0.0–0.2)

## 2021-03-07 LAB — PATHOLOGIST SMEAR REVIEW

## 2021-03-07 LAB — PROCALCITONIN: Procalcitonin: 1.44 ng/mL

## 2021-03-07 MED ORDER — MIDODRINE HCL 5 MG PO TABS
5.0000 mg | ORAL_TABLET | Freq: Two times a day (BID) | ORAL | Status: DC
  Administered 2021-03-07 – 2021-03-11 (×8): 5 mg via ORAL
  Filled 2021-03-07 (×8): qty 1

## 2021-03-07 MED ORDER — APIXABAN 2.5 MG PO TABS
2.5000 mg | ORAL_TABLET | Freq: Two times a day (BID) | ORAL | Status: DC
  Administered 2021-03-07 – 2021-03-11 (×8): 2.5 mg via ORAL
  Filled 2021-03-07 (×8): qty 1

## 2021-03-07 MED ORDER — FUROSEMIDE 10 MG/ML IJ SOLN
40.0000 mg | Freq: Once | INTRAMUSCULAR | Status: AC
  Administered 2021-03-07: 40 mg via INTRAVENOUS
  Filled 2021-03-07: qty 4

## 2021-03-07 NOTE — Progress Notes (Signed)
Physical Therapy Treatment Patient Details Name: Angel Cooper MRN: [id number] DOB: Aug 11, 1930 Today's Date: 03/07/2021   History of Present Illness 88 y/o male admitted 03/01/21 for Abd pain and constipation x 3 days. Recently seen PCP 2 weeks ago after diagnosed with COVID and CXR showed PNA. Pt with SBO, Afib with RVR, and PNA. PMH: CKD, Afib, hx of renal resection due to RCC, HTN    PT Comments    Patient progressing towards physical therapy goals. Patient able to progress OOB to recliner with modA+2 and HHAx2. Patient with heavy reliance on UE support. Patient motivated to get stronger in hopes to return home. Patient performed sit to stand x 3 with patient requesting to do it without assist. Easily fatigued following. Continue to recommend SNF for ongoing Physical Therapy.       Recommendations for follow up therapy are one component of a multi-disciplinary discharge planning process, led by the attending physician.  Recommendations may be updated based on patient status, additional functional criteria and insurance authorization.  Follow Up Recommendations  Skilled nursing-short term rehab (<3 hours/day)     Assistance Recommended at Discharge Frequent or constant Supervision/Assistance  Patient can return home with the following Two people to help with walking and/or transfers;A lot of help with bathing/dressing/bathroom;Assistance with cooking/housework;Assist for transportation;Help with stairs or ramp for entrance   Equipment Recommendations  Wheelchair (measurements PT);Wheelchair cushion (measurements PT);Hospital bed    Recommendations for Other Services       Precautions / Restrictions Precautions Precautions: Fall;Other (comment) Precaution Comments: R elbow/handpain- gout, watch sats Restrictions Weight Bearing Restrictions: No     Mobility  Bed Mobility Overal bed mobility: Needs Assistance Bed Mobility: Supine to Sit     Supine to sit: Min assist, HOB  elevated     General bed mobility comments: increased time and effort, min A to elevate trunk    Transfers Overall transfer level: Needs assistance Equipment used: 2 person hand held assist Transfers: Sit to/from Stand, Bed to chair/wheelchair/BSC Sit to Stand: Mod assist, +2 safety/equipment, +2 physical assistance   Step pivot transfers: Mod assist, +2 physical assistance, +2 safety/equipment       General transfer comment: heavy use of UE during transfer and modA+2 to stand and steady during step pivot transfer    Ambulation/Gait                   Stairs             Wheelchair Mobility    Modified Rankin (Stroke Patients Only)       Balance Overall balance assessment: Needs assistance Sitting-balance support: No upper extremity supported, Feet supported Sitting balance-Leahy Scale: Fair     Standing balance support: During functional activity, Bilateral upper extremity supported Standing balance-Leahy Scale: Poor                              Cognition Arousal/Alertness: Awake/alert Behavior During Therapy: WFL for tasks assessed/performed Overall Cognitive Status: Impaired/Different from baseline Area of Impairment: Orientation, Safety/judgement, Problem solving                 Orientation Level: Disoriented to, Time       Safety/Judgement: Decreased awareness of deficits, Decreased awareness of safety   Problem Solving: Requires verbal cues, Requires tactile cues          Exercises Other Exercises Other Exercises: sit to stand x 3, patient requesting no physical assist.  Increased time and effort to complete    General Comments General comments (skin integrity, edema, etc.): VSS on 5L O2 [place]      Pertinent Vitals/Pain Pain Assessment Pain Assessment: Faces Faces Pain Scale: Hurts a little bit Pain Location: R hand/elbow Pain Descriptors / Indicators: Aching, Discomfort, Grimacing, Tender Pain Intervention(s):  Monitored during session, Repositioned    Home Living                          Prior Function            PT Goals (current goals can now be found in the care plan section) Acute Rehab PT Goals Patient Stated Goal: to get stronger PT Goal Formulation: With patient/family Time For Goal Achievement: 03/17/21 Potential to Achieve Goals: Fair Progress towards PT goals: Progressing toward goals    Frequency    Min 3X/week      PT Plan Current plan remains appropriate    Co-evaluation PT/OT/SLP Co-Evaluation/Treatment: Yes Reason for Co-Treatment: For patient/therapist safety;To address functional/ADL transfers PT goals addressed during session: Mobility/safety with mobility;Balance        AM-PAC PT "6 Clicks" Mobility   Outcome Measure  Help needed turning from your back to your side while in a flat bed without using bedrails?: A Lot Help needed moving from lying on your back to sitting on the side of a flat bed without using bedrails?: A Lot Help needed moving to and from a bed to a chair (including a wheelchair)?: Total Help needed standing up from a chair using your arms (e.g., wheelchair or bedside chair)?: Total Help needed to walk in hospital room?: Total Help needed climbing 3-5 steps with a railing? : Total 6 Click Score: 8    End of Session Equipment Utilized During Treatment: Gait belt;Oxygen Activity Tolerance: Patient tolerated treatment well Patient left: in chair;with call bell/phone within reach Nurse Communication: Mobility status PT Visit Diagnosis: Unsteadiness on feet (R26.81);Muscle weakness (generalized) (M62.81);Difficulty in walking, not elsewhere classified (R26.2)     Time: 7078-6754 PT Time Calculation (min) (ACUTE ONLY): 25 min  Charges:  $Therapeutic Activity: 8-22 mins                     Kristoff Coonradt A. Gilford Rile PT, DPT Acute Rehabilitation Services Pager [phone] Office [phone]    Linna Hoff 03/07/2021, 4:49  PM

## 2021-03-07 NOTE — Progress Notes (Signed)
PROGRESS NOTE    ALVAR MALINOSKI  DZH:[id number] DOB: 19-Aug-1930 DOA: 03/01/2021 PCP: [name], MD   Chief Complaint  Patient presents with   Abdominal Pain    Brief Narrative:    88 year old male with CKD 3B, paroxysmal A. fib, history of renal resection due to [place], HTN, HLD comes into the hospital with abdominal pain for the past 3 days.  Apparently he was diagnosed with COVID as an outpatient about 2 weeks ago, was prescribed medication but never took them.  He saw his PCP a few days ago, had some respiratory symptoms and a chest x-ray showed pneumonia.  He then developed abdominal discomfort and came to the hospital.  He was found to have a small bowel obstruction, A. fib with RVR as well as left lobar pneumonia.  General surgery consulted.  He improved with conservative management and his NG tube was discontinued 2/6.  PT recommends SNF - as of 2/7, patient with progressive dyspnea, increased work of breathing, he did require NRB, transition during the day to 15 L high flow nasal cannula, work-up significant for volume overload, with large left> right pleural effusion.  Assessment & Plan:   Principal Problem:   Partial small bowel obstruction (HCC) Active Problems:   Acute respiratory failure with hypoxia (HCC)   Atrial fibrillation with RVR (HCC)   CAD (coronary artery disease) of artery bypass graft   Chronic kidney disease, stage 3b (HCC)   Leukocytosis   Essential hypertension   Anemia of chronic disease   Personal history of other malignant neoplasm of kidney   CAP (community acquired pneumonia)   History of COVID-19   Right shoulder pain due to gout   Protein-calorie malnutrition, severe   Acute on chronic combined systolic and diastolic CHF (congestive heart failure) (HCC)   Pleural effusion  Acute hypoxic respiratory failure -2/7 patient developed significant dyspnea, hypoxia with oxygen requirement up to 15 L high flow nasal cannula. -This is exacerbated by  volume overload and pleural effusion, with underlying Monia atelectasis and severe deconditioning -Improved after thoracentesis yesterday.  Pleural effusion -CT chest this morning significant for bilateral pleural effusion,. -Input greatly appreciated, status post thoracentesis with 500 cc pleural fluid out of right pleural space  -He was encouraged to use incentive spirometer and flutter valve today, discussed with staff, he is already on chair, at the side for prolonged period of time.    Partial small bowel obstruction ([place])- (present on admission) - General surgery consulted and followed patient while hospitalized.  He was treated with conservative management with NG tube, IV fluids, n.p.o.  He improved, NG tube was discontinued 2/6 and his diet is slowly advanced.  He is doing better this morning -Tolerating soft diet, no nausea, no vomiting  Atrial fibrillation with RVR (HCC)- (present on admission) - Patient was started on Cardizem infusion while strict n.p.o.,, once his diet was advanced 2/6 he was placed on oral Cardizem 30 every 6.  He is off IV infusion, heart rate much better controlled.  Will consolidate and switch to 60 mg Q2 and potentially on discharge could go to 120 every 24 extended release.  He was on heparin infusion while n.p.o. and converted to Eliquis.  He was not on anticoagulation prior to admission.  I discussed with patient's son over the phone, they are not sure why he is not on anticoagulation but believe he may have not "felt good" in the past but they do not remember exactly.  He has not had  any falls and is otherwise active, somewhat limited by his knees.  Start Eliquis   CAP (community acquired pneumonia) -Started on antibiotics with ceftriaxone and azithromycin, continue for a total of 5 days.     AKI on Chronic kidney disease, stage 3b ([place])- (present on admission) - Baseline creatinine around 1.5.  Currently at baseline.   -Continued up to 2.1 as he received  IV Lasix overnight .   Essential hypertension- (present on admission) - Currently on furosemide, diltiazem.  Stable   Anemia of chronic disease- (present on admission) -Hemoglobin stable, no bleeding   Acute on Chronic combined systolic and diastolic heart failure ([place]) - 2D echo done this hospitalization shows an EF of 35-40%.  Patient has a history of coronary artery disease, and CABG in 1999.  His most recent 2D echo in the system is in 2015 and EF at that time was 45 to 50% with diffuse hypokinesis.  Continue Lasix, does not appear overly fluid overloaded   Right shoulder pain due to gout - Started on 2/5, patient reports history of gouty attack at various sites of the body, this reminds him of one.  Received 125 Solu-Medrol 2/6, started on colchicine, significantly better today, he can move his arm now.  Switch to prednisone do a short 4-day course   History of COVID-19 -Son at bedside, reports father tested positive for 1 /13 / 2023, he was prescribed oral antiviral by PCP but he did not take.  . -Patient is 21 days currently from his initial infection, no need for COVID isolation, isolation has been discontinued . will discontinue.  .  Personal history of other malignant neoplasm of kidney - Noted   Leukocytosis - Due to pneumonia as well as steroid use   CAD (coronary artery disease) of artery bypass graft- (present on admission) - No chest pain, history of CABG in 1999  DVT prophylaxis: Eliquis>> lovenox Code Status: Full Family Communication: D/W son at bedside Disposition:   Status is: Inpatient            Consultants:  PCCM   Subjective:  Patient reported dyspnea has improved, still feeling weak, he is having poor appetite today.    Objective: Vitals:   03/07/21 0000 03/07/21 0400 03/07/21 0809 03/07/21 0819  BP: 113/70 113/64 (!) 110/48   Pulse: 80 76 99   Resp: (!) 27 (!) 26 (!) 28   Temp: 98.1 F (36.7 C) 98 F (36.7 C) 98.2 F (36.8 C)    TempSrc: Oral Oral Oral   SpO2: 97% 99% 91% 95%  Weight:      Height:        Intake/Output Summary (Last 24 hours) at 03/07/2021 1516 Last data filed at 03/07/2021 1443 Gross per 24 hour  Intake --  Output 2000 ml  Net -2000 ml   Filed Weights   03/03/21 0330 03/05/21 0433 03/06/21 0500  Weight: 58.2 kg 63 kg 63.7 kg    Examination:   Awake Alert, Oriented X 3, frail, deconditioned and chronically ill-appearing Symmetrical Chest wall movement, diminished air entry at the bases. RRR,No Gallops,Rubs or new Murmurs, No Parasternal Heave +ve B.Sounds, Abd Soft, No tenderness, No rebound - guarding or rigidity. No Cyanosis, Clubbing or edema, No new Rash or bruise       Data Reviewed: I have personally reviewed following labs and imaging studies  CBC: Recent Labs  Lab 03/01/21 1753 03/02/21 0227 03/03/21 0206 03/04/21 0208 03/05/21 0049 03/06/21 0057 03/07/21 0407  WBC 15.5*   < >  13.9* 17.8* 23.1* 25.2* 18.1*  NEUTROABS 13.1*  --   --   --   --   --   --   HGB 11.3*   < > 9.9* 8.6* 9.5* 9.2* 8.4*  HCT 35.4*   < > 30.5* 27.3* 29.5* 29.1* 25.3*  MCV 88.5   < > 88.9 90.4 90.5 89.3 88.2  PLT 686*   < > 572* 486* 492* 518* 370   < > = values in this interval not displayed.    Basic Metabolic Panel: Recent Labs  Lab 03/01/21 1753 03/02/21 0226 03/03/21 1147 03/04/21 0208 03/05/21 0049 03/06/21 0057 03/07/21 0407  NA 133* 135 138 137 136 134* 136  K 3.8 4.1 3.8 3.5 4.5 3.9 3.4*  CL 97* 99 100 104 102 100 104  CO2 24 24 22 22  21* 19* 23  GLUCOSE 118* 111* 88 71 218* 228* 122*  BUN 31* 27* 24* 26* 40* 58* 59*  CREATININE 1.51* 1.38* 1.61* 1.58* 1.69* 2.14* 1.74*  CALCIUM 8.7* 8.2* 8.6* 8.0* 8.4* 8.2* 8.0*  MG 1.6* 2.0  --   --   --   --   --     GFR: Estimated Creatinine Clearance: 24.1 mL/min (A) (by C-G formula based on SCr of 1.74 mg/dL (H)).  Liver Function Tests: Recent Labs  Lab 03/01/21 1753 03/02/21 0226 03/06/21 0057  AST 20 15 201*  ALT 6 7  52*  ALKPHOS 71 61 79  BILITOT 0.8 0.4 0.2*  PROT 6.6 5.7* 6.0*  ALBUMIN 3.1* 2.6* 2.6*    CBG: No results for input(s): GLUCAP in the last 168 hours.   Recent Results (from the past 240 hour(s))  Blood culture (routine x 2)     Status: None   Collection Time: 03/01/21  7:00 PM   Specimen: BLOOD RIGHT ARM  Result Value Ref Range Status   Specimen Description BLOOD RIGHT ARM  Final   Special Requests   Final    BOTTLES DRAWN AEROBIC AND ANAEROBIC Blood Culture results may not be optimal due to an excessive volume of blood received in culture bottles   Culture   Final    NO GROWTH 5 DAYS Performed at [facility], [place] [address], [place], [place] [address]    Report Status 03/06/2021 FINAL  Final  Blood culture (routine x 2)     Status: None   Collection Time: 03/01/21  7:05 PM   Specimen: BLOOD LEFT ARM  Result Value Ref Range Status   Specimen Description BLOOD LEFT ARM  Final   Special Requests   Final    BOTTLES DRAWN AEROBIC AND ANAEROBIC Blood Culture adequate volume   Culture   Final    NO GROWTH 5 DAYS Performed at [facility], [place] [address] [place], [place] [address]    Report Status 03/06/2021 FINAL  Final  Respiratory (~20 pathogens) panel by PCR     Status: None   Collection Time: 03/01/21  8:11 PM   Specimen: Nasopharyngeal Swab; Respiratory  Result Value Ref Range Status   Adenovirus NOT DETECTED NOT DETECTED Final   Coronavirus 229E NOT DETECTED NOT DETECTED Final    Comment: (NOTE) The Coronavirus on the Respiratory Panel, DOES NOT test for the novel  Coronavirus (2019 nCoV)    Coronavirus HKU1 NOT DETECTED NOT DETECTED Final   Coronavirus NL63 NOT DETECTED NOT DETECTED Final   Coronavirus OC43 NOT DETECTED NOT DETECTED Final   Metapneumovirus NOT DETECTED NOT DETECTED Final  Rhinovirus / Enterovirus NOT DETECTED NOT DETECTED Final   Influenza A NOT DETECTED NOT DETECTED Final   Influenza B NOT DETECTED NOT DETECTED Final    Parainfluenza Virus 1 NOT DETECTED NOT DETECTED Final   Parainfluenza Virus 2 NOT DETECTED NOT DETECTED Final   Parainfluenza Virus 3 NOT DETECTED NOT DETECTED Final   Parainfluenza Virus 4 NOT DETECTED NOT DETECTED Final   Respiratory Syncytial Virus NOT DETECTED NOT DETECTED Final   Bordetella pertussis NOT DETECTED NOT DETECTED Final   Bordetella Parapertussis NOT DETECTED NOT DETECTED Final   Chlamydophila pneumoniae NOT DETECTED NOT DETECTED Final   Mycoplasma pneumoniae NOT DETECTED NOT DETECTED Final    Comment: Performed at [facility], [place] [address], [place], [place] [address]  Resp Panel by RT-PCR (Flu A&B, Covid) Nasopharyngeal Swab     Status: Abnormal   Collection Time: 03/02/21  3:12 AM   Specimen: Nasopharyngeal Swab; Nasopharyngeal(NP) swabs in vial transport medium  Result Value Ref Range Status   SARS Coronavirus 2 by RT PCR POSITIVE (A) NEGATIVE Final    Comment: (NOTE) SARS-CoV-2 target nucleic acids are DETECTED.  The SARS-CoV-2 RNA is generally detectable in upper respiratory specimens during the acute phase of infection. Positive results are indicative of the presence of the identified virus, but do not rule out bacterial infection or co-infection with other pathogens not detected by the test. Clinical correlation with patient history and other diagnostic information is necessary to determine patient infection status. The expected result is Negative.  Fact Sheet for Patients: EntrepreneurPulse.com.au  Fact Sheet for Healthcare Providers: IncredibleEmployment.be  This test is not yet approved or cleared by the [place] FDA and  has been authorized for detection and/or diagnosis of SARS-CoV-2 by FDA under an Emergency Use Authorization (EUA).  This EUA will remain in effect (meaning this test can be used) for the duration of  the COVID-19 declaration under Section 564(b)(1) of the A ct, 21 U.S.C. section  360bbb-3(b)(1), unless the authorization is terminated or revoked sooner.     Influenza A by PCR NEGATIVE NEGATIVE Final   Influenza B by PCR NEGATIVE NEGATIVE Final    Comment: (NOTE) The Xpert Xpress SARS-CoV-2/FLU/RSV plus assay is intended as an aid in the diagnosis of influenza from Nasopharyngeal swab specimens and should not be used as a sole basis for treatment. Nasal washings and aspirates are unacceptable for Xpert Xpress SARS-CoV-2/FLU/RSV testing.  Fact Sheet for Patients: EntrepreneurPulse.com.au  Fact Sheet for Healthcare Providers: IncredibleEmployment.be  This test is not yet approved or cleared by the [place] FDA and has been authorized for detection and/or diagnosis of SARS-CoV-2 by FDA under an Emergency Use Authorization (EUA). This EUA will remain in effect (meaning this test can be used) for the duration of the COVID-19 declaration under Section 564(b)(1) of the Act, 21 U.S.C. section 360bbb-3(b)(1), unless the authorization is terminated or revoked.  Performed at [facility], [place] [address], [place], [place] [address]   Body fluid culture w Gram Stain     Status: None (Preliminary result)   Collection Time: 03/06/21  3:35 PM   Specimen: Pleural Fluid  Result Value Ref Range Status   Specimen Description FLUID PLEURAL  Final   Special Requests NONE  Final   Gram Stain   Final    RARE WBC PRESENT,BOTH PMN AND MONONUCLEAR NO ORGANISMS SEEN    Culture   Final    NO GROWTH < 24 HOURS Performed at [facility], [address] [place], [place] [address]    Report Status PENDING  Incomplete         Radiology Studies: DG Chest 1 View  Result Date: 03/06/2021 CLINICAL DATA:  Follow-up pleural effusion. EXAM: CHEST  1 VIEW COMPARISON:  Prior today FINDINGS: Small left pleural effusion shows decreased size since prior study. No pneumothorax visualized. Elevated left hemidiaphragm again seen.  Atelectasis or infiltrate in left lung base is unchanged. Right upper lobe airspace disease is also stable. Gas again seen within moderate to large hiatal hernia. Stable cardiomegaly.  Prior CABG again noted. IMPRESSION: Decreased size of small left pleural effusion. No pneumothorax visualized. Stable left lower lobe atelectasis versus infiltrate. Stable right upper lobe airspace disease. Electronically Signed   By: Marlaine Hind M.D.   On: 03/06/2021 15:57   CT CHEST WO CONTRAST  Result Date: 03/06/2021 CLINICAL DATA:  Hypoxia, COVID versus bacterial pneumonia, volume overload. EXAM: CT CHEST WITHOUT CONTRAST TECHNIQUE: Multidetector CT imaging of the chest was performed following the standard protocol without IV contrast. RADIATION DOSE REDUCTION: This exam was performed according to the departmental dose-optimization program which includes automated exposure control, adjustment of the mA and/or kV according to patient size and/or use of iterative reconstruction technique. COMPARISON:  CT abdomen pelvis 03/01/2021 and CT chest 05/09/2016. FINDINGS: Cardiovascular: Atherosclerotic calcification of the aorta with a probable surgical plaque in the transverse portion. Aortic valvular calcification. Pulmonic trunk and heart are enlarged. No pericardial effusion. Mediastinum/Nodes: Mediastinal lymph nodes are not enlarged by CT size criteria. Hilar regions are difficult to evaluate without IV contrast. No axillary adenopathy. Moderate to large hiatal hernia. Esophagus is otherwise grossly unremarkable. Lungs/Pleura: Patchy bilateral ground-glass and septal thickening with some associated consolidation. Large bilateral pleural effusions with collapse/consolidation in the left lower lobe. Mild central bronchiectasis. Airway is unremarkable. Upper Abdomen: Visualized portions of the liver, adrenal glands, left kidney, spleen and pancreas are unremarkable. Moderate to large hiatal hernia. Minimally distended fluid-filled  loops of proximal small bowel. Small ascites. Musculoskeletal: Degenerative changes in the spine no worrisome lytic or sclerotic lesions. IMPRESSION: 1. Diffuse patchy ground-glass, septal thickening and scattered consolidation, findings suggestive of COVID-19 pneumonia. 2. Large bilateral pleural effusions with left lower lobe collapse/consolidation, possibly a combination of atelectasis and pneumonia. 3. Mild central bronchiectasis. 4. Moderate to large hiatal hernia. 5. Mildly distended fluid-filled loops of small bowel in the left abdomen, compatible with the history of small-bowel obstruction on 03/01/2021. 6. Small ascites. 7.  Aortic atherosclerosis (ICD10-I70.0). 8. Enlarged pulmonic trunk, indicative of pulmonary arterial hypertension. Electronically Signed   By: Lorin Picket M.D.   On: 03/06/2021 10:55   DG CHEST PORT 1 VIEW  Result Date: 03/06/2021 CLINICAL DATA:  Dyspnea EXAM: PORTABLE CHEST 1 VIEW COMPARISON:  03/01/2021 FINDINGS: Lung volumes are slightly small though pulmonary insufflation remain stable since prior examination. Superimposed multifocal pulmonary consolidation has progressed in the interval since prior examination, particular within the right upper and lower lung zones. Small to moderate left and small right pleural effusions have developed. No pneumothorax. Coronary artery bypass grafting has been performed. Mild cardiomegaly is stable. Moderate hiatal hernia again noted. IMPRESSION: Stable pulmonary hypoinflation. Progressive multifocal pulmonary consolidation in keeping with multifocal pneumonia in the appropriate clinical setting. Interval development of bilateral pleural effusions, left greater than right. Stable cardiomegaly. Moderate hiatal hernia. Electronically Signed   By: Fidela Salisbury M.D.   On: 03/06/2021 01:25   DG Swallowing Func-Speech Pathology  Result Date: 03/07/2021 Table formatting from the original result  was not included. Objective Swallowing Evaluation:  Type of Study: MBS-Modified Barium Swallow Study  Patient Details Name: AARAV BURGETT MRN: [id number] Date of Birth: [birth year] Today's Date: 03/07/2021 Time: SLP Start Time (ACUTE ONLY): 9528 -SLP Stop Time (ACUTE ONLY): 1218 SLP Time Calculation (min) (ACUTE ONLY): 19 min Past Medical History: Past Medical History: Diagnosis Date  Atrial fibrillation ([place])   2015  W/ RVR  Bladder tumor   CHF (congestive heart failure) ([place]) 2015  CKD (chronic kidney disease), stage III ([place])   Coronary artery disease   NO LONGER SEES CARDIOLOGIST - LAST SAW DR. 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[facility]) urologist-  dr herrick/  oncologist- dr [name]  dx 2014  s/p  right nephroureterectomy 01-24-2013 (T3, N0) invasive high grade urothelial carcinoma--  Past Surgical History: Past Surgical History: Procedure Laterality Date  AMPUTATION FINGER Left 12/07/2018  Procedure: Amputation Left Long Finger;  Surgeon: Roseanne Kaufman, MD;  Location: [facility];  Service: Orthopedics;  Laterality: Left;  CARDIAC CATHETERIZATION  07/05/1999  non-q MI;  3vessel CAD w/ high grade pLAD and mLAD, subtotal ramus branch,  OM1 70-80%,  ostialCFX 50-60%,  dCFx 80%,  RI 99%,  total occluded pRCA and fills last by right-to-right collaterals,  pLM  20% narrowing;  severe inferoapical hypokinesis  CATARACT EXTRACTION W/ INTRAOCULAR LENS  IMPLANT, BILATERAL    CORONARY ARTERY BYPASS GRAFT  06/ 2001  dr [name] tright  CYSTOSCOPY W/ RETROGRADES Left 01/31/2016  Procedure: CYSTOSCOPY WITH RETROGRADE PYELOGRAM;  Surgeon: [name], MD;  Location: [facility];  Service: Urology;  Laterality: Left;  CYSTOSCOPY WITH RETROGRADE PYELOGRAM, URETEROSCOPY AND STENT PLACEMENT Right 12/03/2012  Procedure: CYSTOSCOPY WITH RIGHT RETROGRADE PYELOGRAM,RIGHT URETERAL WASHINGS, RIGHT URETERAL BIOPSY, RIGHT URETEROSCOPY AND STENT PLACEMENT;  Surgeon: [name], MD;  Location: [facility];  Service: Urology;  Laterality: Right;  CYSTOSCOPY WITH RETROGRADE PYELOGRAM, URETEROSCOPY AND STENT PLACEMENT Right 12/15/2012  Procedure: CYSTOSCOPY WITH RIGHT RETROGRADE PYELOGRAM, POSSIBLE RIGHT URETEROSCOPY AND STENT PLACEMENT AND REMOVAL OF LEFT STENT;  Surgeon: [name], MD;  Location: [facility];  Service: Urology;  Laterality: Right;  CYSTOSCOPY WITH STENT PLACEMENT Right 01/24/2013  Procedure: CYSTOSCOPY WITH STENT PLACEMENT;  Surgeon: [name], MD;  Location: [facility];  Service: Urology;  Laterality: Right;  I & D EXTREMITY Left 12/07/2018  Procedure: IRRIGATION AND DEBRIDEMENT EXTREMITY;  Surgeon: Roseanne Kaufman, MD;  Location: [facility];  Service: Orthopedics;  Laterality: Left;  INCISION AND DRAINAGE ABSCESS Left 06/02/2016  Procedure: INCISION AND DRAINAGE LEFT SMALL FINGER;  Surgeon: [name], MD;  Location: [facility];  Service: Orthopedics;  Laterality: Left;  ROBOT ASSITED LAPAROSCOPIC NEPHROURETERECTOMY Right 01/24/2013  Procedure: ROBOT ASSISTED LAPAROSCOPIC NEPHROURETERECTOMY WITH BILATERAL LYMPH NODE DISSECTION ;  Surgeon: [name], MD;  Location: [facility];  Service: Urology;  Laterality: Right;  right kidney  TRANSTHORACIC ECHOCARDIOGRAM  01/28/2013  mild LVH,  ef 45-50%, diffuse hypokinesis,  grade 1 diastolic dysfunction/  moderate LAE/  trivial  PR/  mild TR, PASP 50-22mmHg  TRANSURETHRAL RESECTION OF BLADDER TUMOR N/A 01/31/2016  Procedure: TRANSURETHRAL RESECTION OF BLADDER TUMOR (TURBT);  Surgeon: [name], MD;  [facility];  Service: Urology;   Laterality: N/A; HPI: 88 y/o male admitted 03/01/21 for Abd pain and constipation x 3 days. Recently seen PCP 2 weeks ago after diagnosed with COVID and CXR showed PNA. Per PCCM note 03/06/21, there is suspicion for silent reflux/aspiration due to known large hiatal hernia. Pt with SBO, Afib with RVR, and PNA. PMH: GERD, CKD, Afib, hx of renal resection due to [place], HTN  Subjective: denies trouble swallowing  Recommendations for follow up therapy are one component of a multi-disciplinary discharge planning process, led by the attending physician.  Recommendations may be updated based on patient status, additional functional criteria and insurance authorization. Assessment / Plan / Recommendation Clinical Impressions 03/07/2021 Clinical Impression Pt has a mild pharyngoesophageal dysphagia with no aspiration observed throughout the study. Base of tongue retraction is mildly reduced, leading to incomplete epiglottic deflection and as a result, pt has mild valleculae residue and intermittent penetration of thin liquids during the swallow. Penetration clears spontaneously (PAS 2) at least during subsequent swallows. He has the appearance of a prominent CP as wella s reduced UES relaxation with suspected cervical osteophytes that could be contributing, but these do not appear to significantly impact bolus flow. Recommend continuing with regular solids and thin liquids with use of general aspiration precautions. Will f/u briefly for reinforcement and education as he remains inpatient. SLP Visit Diagnosis Dysphagia, pharyngoesophageal phase (R13.14) Attention and concentration deficit following -- Frontal lobe and executive function deficit following -- Impact on safety and function Mild aspiration risk   Treatment Recommendations 03/07/2021 Treatment Recommendations Therapy as outlined in treatment plan below   Prognosis 03/07/2021 Prognosis for Safe Diet Advancement Good Barriers to Reach Goals -- Barriers/Prognosis Comment -- Diet  Recommendations 03/07/2021 SLP Diet Recommendations Regular solids;Thin liquid Liquid Administration via Cup;Straw Medication Administration Whole meds with liquid Compensations Slow rate;Small sips/bites Postural Changes Seated upright at 90 degrees;Remain semi-upright after after feeds/meals (Comment)   Other Recommendations 03/07/2021 Recommended Consults -- Oral Care Recommendations Oral care BID Other Recommendations -- Follow Up Recommendations No SLP follow up Assistance recommended at discharge PRN Functional Status Assessment Patient has not had a recent decline in their functional status Frequency and Duration  03/07/2021 Speech Therapy Frequency (ACUTE ONLY) min 1 x/week Treatment Duration 1 week   Oral Phase 03/07/2021 Oral Phase WFL Oral - Pudding Teaspoon -- Oral - Pudding Cup -- Oral - Honey Teaspoon -- Oral - Honey Cup -- Oral - Nectar Teaspoon -- Oral - Nectar Cup -- Oral - Nectar Straw -- Oral - Thin Teaspoon -- Oral - Thin Cup -- Oral - Thin Straw -- Oral - Puree -- Oral - Mech Soft -- Oral - Regular -- Oral - Multi-Consistency -- Oral - Pill -- Oral Phase - Comment --  Pharyngeal Phase 03/07/2021 Pharyngeal Phase Impaired Pharyngeal- Pudding Teaspoon -- Pharyngeal -- Pharyngeal- Pudding Cup -- Pharyngeal -- Pharyngeal- Honey Teaspoon -- Pharyngeal -- Pharyngeal- Honey Cup -- Pharyngeal -- Pharyngeal- Nectar Teaspoon -- Pharyngeal -- Pharyngeal- Nectar Cup -- Pharyngeal -- Pharyngeal- Nectar Straw -- Pharyngeal -- Pharyngeal- Thin Teaspoon -- Pharyngeal -- Pharyngeal- Thin Cup Reduced tongue base retraction;Reduced epiglottic inversion;Pharyngeal residue - valleculae;Penetration/Aspiration during swallow Pharyngeal Material enters airway, remains ABOVE vocal cords then ejected out Pharyngeal- Thin Straw Reduced tongue base retraction;Reduced epiglottic inversion;Pharyngeal residue - valleculae;Penetration/Aspiration during swallow  Pharyngeal Material enters airway, remains ABOVE vocal cords then ejected out  Pharyngeal- Puree Reduced tongue base retraction;Reduced epiglottic inversion;Pharyngeal residue - valleculae Pharyngeal -- Pharyngeal- Mechanical Soft -- Pharyngeal -- Pharyngeal- Regular Reduced tongue base retraction;Reduced epiglottic inversion;Pharyngeal residue - valleculae Pharyngeal -- Pharyngeal- Multi-consistency -- Pharyngeal -- Pharyngeal- Pill Reduced tongue base retraction;Reduced epiglottic inversion Pharyngeal -- Pharyngeal Comment --  Cervical Esophageal Phase  03/07/2021 Cervical Esophageal Phase Impaired Pudding Teaspoon -- Pudding Cup -- Honey Teaspoon -- Honey Cup -- Nectar Teaspoon -- Nectar Cup -- Nectar Straw -- Thin Teaspoon -- Thin Cup Reduced cricopharyngeal relaxation;Prominent cricopharyngeal segment Thin Straw Reduced cricopharyngeal relaxation;Prominent cricopharyngeal segment Puree Reduced cricopharyngeal relaxation;Prominent cricopharyngeal segment Mechanical Soft -- Regular Reduced cricopharyngeal relaxation;Prominent cricopharyngeal segment Multi-consistency -- Pill Reduced cricopharyngeal relaxation;Prominent cricopharyngeal segment Cervical Esophageal Comment -- Osie Bond., M.A. CCC-SLP Acute Rehabilitation Services Pager [phone] Office [phone] 03/07/2021, 2:07 PM                     VAS [place] LOWER EXTREMITY VENOUS (DVT)  Result Date: 03/07/2021  Lower Venous DVT Study Patient Name:  MARTI ACEBO  Date of Exam:   03/07/2021 Medical Rec #: [id number]          Accession #:    [id number] Date of Birth: [birth year]           Patient Gender: M Patient Age:   61 years Exam Location:  [facility] Procedure:      VAS [place] LOWER EXTREMITY VENOUS (DVT) Referring Phys: Nevin Bloodgood SIMPSON --------------------------------------------------------------------------------  Indications: Hypoxia.  Risk Factors: None identified. Limitations: Body habitus. Comparison Study: No prior studies. Performing Technologist: Oliver Hum RVT  Examination Guidelines: A complete evaluation includes  B-mode imaging, spectral Doppler, color Doppler, and power Doppler as needed of all accessible portions of each vessel. Bilateral testing is considered an integral part of a complete examination. Limited examinations for reoccurring indications may be performed as noted. The reflux portion of the exam is performed with the patient in reverse Trendelenburg.  +---------+---------------+---------+-----------+----------+--------------+  RIGHT     Compressibility Phasicity Spontaneity Properties Thrombus Aging  +---------+---------------+---------+-----------+----------+--------------+  CFV       Full            Yes       No                                     +---------+---------------+---------+-----------+----------+--------------+  SFJ       Full                                                             +---------+---------------+---------+-----------+----------+--------------+  FV Prox   Full                                                             +---------+---------------+---------+-----------+----------+--------------+  FV Mid    Full                                                             +---------+---------------+---------+-----------+----------+--------------+  FV Distal Full                                                             +---------+---------------+---------+-----------+----------+--------------+  PFV       Full                                                             +---------+---------------+---------+-----------+----------+--------------+  POP       Full            Yes       No                                     +---------+---------------+---------+-----------+----------+--------------+  PTV       Full                                                             +---------+---------------+---------+-----------+----------+--------------+  PERO      Full                                                              +---------+---------------+---------+-----------+----------+--------------+   +---------+---------------+---------+-----------+----------+--------------+  LEFT      Compressibility Phasicity Spontaneity Properties Thrombus Aging  +---------+---------------+---------+-----------+----------+--------------+  CFV       Full            Yes       No                                     +---------+---------------+---------+-----------+----------+--------------+  SFJ       Full                                                             +---------+---------------+---------+-----------+----------+--------------+  FV Prox   Full                                                             +---------+---------------+---------+-----------+----------+--------------+  FV Mid    Full                                                             +---------+---------------+---------+-----------+----------+--------------+  FV Distal Full                                                             +---------+---------------+---------+-----------+----------+--------------+  PFV       Full                                                             +---------+---------------+---------+-----------+----------+--------------+  POP       Full            Yes       No                                     +---------+---------------+---------+-----------+----------+--------------+  PTV       Full                                                             +---------+---------------+---------+-----------+----------+--------------+  PERO      Full                                                             +---------+---------------+---------+-----------+----------+--------------+    Summary: RIGHT: - There is no evidence of deep vein thrombosis in the lower extremity.  - No cystic structure found in the popliteal fossa.  LEFT: - There is no evidence of deep vein thrombosis in the lower extremity.  - No cystic structure found in the popliteal fossa.   *See table(s) above for measurements and observations.    Preliminary         Scheduled Meds:  allopurinol  50 mg Oral Daily   colchicine  0.3 mg Oral Daily   diltiazem  60 mg Oral Q12H   docusate sodium  100 mg Oral BID   enoxaparin (LOVENOX) injection  60 mg Subcutaneous Q24H   feeding supplement  237 mL Oral TID BM   furosemide  80 mg Oral Daily   mouth rinse  15 mL Mouth Rinse BID   multivitamin  1 tablet Oral QHS   pantoprazole  40 mg Oral Q1200   polyethylene glycol  17 g Oral Daily   Continuous Infusions:     LOS: 6 days        Phillips Climes, MD [facility]   To contact the attending provider between 7A-7P or the covering provider during after hours 7P-7A, please log into the web site [email or web address] and access using universal [place] password for that web site. If you do not have the password, please call the hospital operator.  03/07/2021, 3:16 PM

## 2021-03-07 NOTE — Progress Notes (Signed)
Nutrition Follow-up  DOCUMENTATION CODES:   Severe malnutrition in context of chronic illness  INTERVENTION:   Continue Ensure Enlive po TID, each supplement provides 350 kcal and 20 grams of protein. Continue Renal Multivitamin w/ minerals daily  NUTRITION DIAGNOSIS:   Severe Malnutrition related to chronic illness (CHF) as evidenced by severe muscle depletion, severe fat depletion. - Ongoing  GOAL:   Patient will meet greater than or equal to 90% of their needs - Ongoing  MONITOR:   PO intake, Supplement acceptance, Labs, Weight trends  REASON FOR ASSESSMENT:   Malnutrition Screening Tool    ASSESSMENT:   88 y.o. male presented to the ED with abdominal pain. PMH includes CAD, CHF, GERD, CKD IIIB, HTN, and ureteral cancer. Pt admitted with a partial small bowel obstruction and pneumonia.   2/04 - NGT placed to LIWS 2/06 - NGT clamped/removed; diet advanced to Full Liquids 2/07 - diet advanced to SOFT 2/08 - Thoracentesis - removed 600 cc's  Pt reports that he is feeling much better today, that yesterday was a really bad day for him. Pt states that he is happy to finally be up in a chair and that this was the first time since he had been there he has been out of bed in the chair. Pt reports that his appetite has improved some and that after the thoracentesis he is able to eat better. Pt reports that his breakfast was a lot of food but he ate some of everything on the tray. Pt denies any nausea or vomiting.  Pt reports that he has been drinking the Ensure's occassionally but not a whole lot, discussed that they are there to support him and drink them as often or as much as he would like.   Pt with no other questions or concerns at this time.   Medications reviewed and include: Colace, Lasix, Rena-Vit, Protonix, Miralax Labs reviewed: Potassium 3.4, BUN 59, Creatinine 1.74  Diet Order:   Diet Order             DIET SOFT Room service appropriate? Yes; Fluid consistency:  Thin  Diet effective now                   EDUCATION NEEDS:   No education needs have been identified at this time  Skin:  Skin Assessment: Reviewed RN Assessment  Last BM:  2/8  Height:   Ht Readings from Last 1 Encounters:  03/01/21 5\' 4"  (1.626 m)    Weight:   Wt Readings from Last 1 Encounters:  03/06/21 63.7 kg    Ideal Body Weight:  59.1 kg  BMI:  Body mass index is 24.11 kg/m.  Estimated Nutritional Needs:   Kcal:  1800-2000  Protein:  90-105 grams  Fluid:  >/= 1.8 L    Alayla Dethlefs Louie Casa, RD, LDN Clinical Dietitian See [facility] for contact information.

## 2021-03-07 NOTE — Care Management Important Message (Signed)
Important Message  Patient Details  Name: Angel Cooper MRN: [id number] Date of Birth: [birth year]   Medicare Important Message Given:  Yes     Orbie Pyo 03/07/2021, 4:25 PM

## 2021-03-07 NOTE — Progress Notes (Signed)
Bilateral lower extremity venous duplex has been completed. Preliminary results can be found in CV Proc through chart review.   03/07/21 10:20 AM Carlos Levering RVT

## 2021-03-07 NOTE — TOC Progression Note (Addendum)
Transition of Care [facility]) - Progression Note    Patient Details  Name: Angel Cooper MRN: [id number] Date of Birth: 06-08-1930  Transition of Care [facility]) CM/SW [place], LCSW Phone Number: 03/07/2021, 2:14 PM  Clinical Narrative:    2:14pm-CSW spoke with patient's son, Aaron Edelman, regarding discharge plan. He reported agreement with SNF placement for patient. Patient has decided on either [facility] or [facility]. CSW checking to see which has a bed available when patient is ready. [facility] bed [facility] and [facility] is checking.   3pm-CSW spoke with patient's son and let him know that both facilities have beds available for Sun/Mon. Son stated to go with [facility]. [facility] will start insurance authorization once updated therapy notes in.    Expected Discharge Plan: [place] Barriers to Discharge: Continued Medical Work up, Ship broker  Expected Discharge Plan and Services Expected Discharge Plan: [place] In-house Referral: Clinical Social Work   Post Acute Care Choice: [place] Living arrangements for the past 2 months: Single Family Home                                       Social Determinants of Health (SDOH) Interventions    Readmission Risk Interventions No flowsheet data found.

## 2021-03-07 NOTE — Evaluation (Signed)
Clinical/Bedside Swallow Evaluation Patient Details  Name: Angel Cooper MRN: [id number] Date of Birth: 1930/11/02  Today's Date: 03/07/2021 Time: SLP Start Time (ACUTE ONLY): 0840 SLP Stop Time (ACUTE ONLY): 0908 SLP Time Calculation (min) (ACUTE ONLY): 28 min  Past Medical History:  Past Medical History:  Diagnosis Date   Atrial fibrillation ([place])    2015  W/ RVR   Bladder tumor    CHF (congestive heart failure) ([place]) 2015   CKD (chronic kidney disease), stage III ([place])    Coronary artery disease    NO LONGER SEES CARDIOLOGIST - LAST SAW DR. [name] 2 OR MORE YRS AGO-PT SEES PCP DR [name]   GERD (gastroesophageal reflux disease)    Hiatal hernia    History of colon polyps    History of gastric ulcer    1989  peptic and duodenal   History of MI (myocardial infarction)    1995   History of non-ST elevation myocardial infarction (NSTEMI)    07-05-1999  s/p cabg   Hyperlipidemia    Hypertension    OA (osteoarthritis)    Osteomyelitis ([place]) 12/07/2018   S/P CABG (coronary artery bypass graft)    06/ 2001   Ureteral carcinoma, right [facility]) urologist-  dr herrick/  oncologist- dr [name]   dx 2014  s/p  right nephroureterectomy 01-24-2013 (T3, N0) invasive high grade urothelial carcinoma--    Past Surgical History:  Past Surgical History:  Procedure Laterality Date   AMPUTATION FINGER Left 12/07/2018   Procedure: Amputation Left Long Finger;  Surgeon: Roseanne Kaufman, MD;  Location: [place];  Service: Orthopedics;  Laterality: Left;   CARDIAC CATHETERIZATION  07/05/1999   non-q MI;  3vessel CAD w/ high grade pLAD and mLAD, subtotal ramus branch,  OM1 70-80%,  ostialCFX 50-60%,  dCFx 80%,  RI 99%,  total occluded pRCA and fills last by right-to-right collaterals,  pLM  20% narrowing;  severe inferoapical hypokinesis   CATARACT EXTRACTION W/ INTRAOCULAR LENS  IMPLANT, BILATERAL     CORONARY ARTERY BYPASS GRAFT  06/ 2001  dr [name] tright   CYSTOSCOPY W/ RETROGRADES Left  01/31/2016   Procedure: CYSTOSCOPY WITH RETROGRADE PYELOGRAM;  Surgeon: [name], MD;  Location: [facility];  Service: Urology;  Laterality: Left;   CYSTOSCOPY WITH RETROGRADE PYELOGRAM, URETEROSCOPY AND STENT PLACEMENT Right 12/03/2012   Procedure: CYSTOSCOPY WITH RIGHT RETROGRADE PYELOGRAM,RIGHT URETERAL WASHINGS, RIGHT URETERAL BIOPSY, RIGHT URETEROSCOPY AND STENT PLACEMENT;  Surgeon: [name], MD;  Location: [facility];  Service: Urology;  Laterality: Right;   CYSTOSCOPY WITH RETROGRADE PYELOGRAM, URETEROSCOPY AND STENT PLACEMENT Right 12/15/2012   Procedure: CYSTOSCOPY WITH RIGHT RETROGRADE PYELOGRAM, POSSIBLE RIGHT URETEROSCOPY AND STENT PLACEMENT AND REMOVAL OF LEFT STENT;  Surgeon: [name], MD;  Location: [facility];  Service: Urology;  Laterality: Right;   CYSTOSCOPY WITH STENT PLACEMENT Right 01/24/2013   Procedure: CYSTOSCOPY WITH STENT PLACEMENT;  Surgeon: [name], MD;  Location: [facility];  Service: Urology;  Laterality: Right;   I & D EXTREMITY Left 12/07/2018   Procedure: IRRIGATION AND DEBRIDEMENT EXTREMITY;  Surgeon: Roseanne Kaufman, MD;  Location: [place];  Service: Orthopedics;  Laterality: Left;   INCISION AND DRAINAGE ABSCESS Left 06/02/2016   Procedure: INCISION AND DRAINAGE LEFT SMALL FINGER;  Surgeon: [name], MD;  Location: [place];  Service: Orthopedics;  Laterality: Left;   ROBOT ASSITED LAPAROSCOPIC NEPHROURETERECTOMY Right 01/24/2013   Procedure: ROBOT ASSISTED LAPAROSCOPIC NEPHROURETERECTOMY WITH BILATERAL LYMPH NODE DISSECTION ;  Surgeon: [name], MD;  Location: [facility];  Service: Urology;  Laterality: Right;  right kidney   TRANSTHORACIC ECHOCARDIOGRAM  01/28/2013   mild LVH,  ef 45-50%, diffuse hypokinesis,  grade 1 diastolic dysfunction/  moderate LAE/  trivial PR/  mild TR, PASP 50-73mmHg   TRANSURETHRAL RESECTION OF BLADDER TUMOR N/A 01/31/2016   Procedure: TRANSURETHRAL RESECTION OF BLADDER  TUMOR (TURBT);  Surgeon: [name], MD;  [facility];  Service: Urology;  Laterality: N/A;   HPI:  88 y/o male admitted 03/01/21 for Abd pain and constipation x 3 days. Recently seen PCP 2 weeks ago after diagnosed with COVID and CXR showed PNA. Per PCCM note 03/06/21, there is suspicion for silent reflux/aspiration due to known large hiatal hernia. Pt with SBO, Afib with RVR, and PNA. PMH: GERD, CKD, Afib, hx of renal resection due to RCC, HTN    Assessment / Plan / Recommendation  Clinical Impression  Pt has occasional coughing that is noted a few seconds after sips of thin liquids. He sometimes uses two swallows per bolus, but denies feeling like he is having any trouble or acute changes wiht his swallowing. He also reports minimal symptoms of reflux lately and denies any prior h/o PNA. Will leave on current diet with aspiration and esophageal precautions reviewed with pt and son, but will plan to proceed with MBS, tentatively scheduled for later this morning, to better evaluate oropharyngeal swallowing given recent PNA and respiratory issues in pt with esophageal history. SLP Visit Diagnosis: Dysphagia, unspecified (R13.10)    Aspiration Risk  Mild aspiration risk    Diet Recommendation Regular;Thin liquid   Liquid Administration via: Cup;Straw Medication Administration: Whole meds with puree Compensations: Slow rate;Small sips/bites Postural Changes: Seated upright at 90 degrees;Remain upright for at least 30 minutes after po intake    Other  Recommendations Recommended Consults: Consider esophageal assessment Oral Care Recommendations: Oral care BID    Recommendations for follow up therapy are one component of a multi-disciplinary discharge planning process, led by the attending physician.  Recommendations may be updated based on patient status, additional functional criteria and insurance authorization.  Follow up Recommendations  (tba)       Assistance Recommended at Discharge    Functional Status Assessment    Frequency and Duration            Prognosis Prognosis for Safe Diet Advancement: Good      Swallow Study   General HPI: 88 y/o male admitted 03/01/21 for Abd pain and constipation x 3 days. Recently seen PCP 2 weeks ago after diagnosed with COVID and CXR showed PNA. Per PCCM note 03/06/21, there is suspicion for silent reflux/aspiration due to known large hiatal hernia. Pt with SBO, Afib with RVR, and PNA. PMH: GERD, CKD, Afib, hx of renal resection due to RCC, HTN Type of Study: Bedside Swallow Evaluation Previous Swallow Assessment: none in chart Diet Prior to this Study: Dysphagia 3 (soft);Thin liquids Temperature Spikes Noted: No Respiratory Status: Nasal cannula History of Recent Intubation: No Behavior/Cognition: Alert;Cooperative;Pleasant mood Oral Cavity Assessment: Within Functional Limits Oral Care Completed by SLP: No Oral Cavity - Dentition: Adequate natural dentition Vision: Functional for self-feeding Self-Feeding Abilities: Able to feed self;Needs set up Patient Positioning: Upright in bed Baseline Vocal Quality: Normal Volitional Swallow: Able to elicit    Oral/Motor/Sensory Function Overall Oral Motor/Sensory Function: Within functional limits   Ice Chips Ice chips: Not tested   Thin Liquid Thin Liquid: Impaired Presentation: Self Fed;Straw  Pharyngeal  Phase Impairments: Multiple swallows;Cough - Delayed    Nectar Thick Nectar Thick Liquid: Not tested   Honey Thick Honey Thick Liquid: Not tested   Puree Puree: Within functional limits Presentation: Spoon;Self Fed   Solid     Solid: Within functional limits Presentation: Self Fed      Osie Bond., M.A. [place] Acute Rehabilitation Services Pager [phone] Office [phone]  03/07/2021,9:15 AM

## 2021-03-07 NOTE — Progress Notes (Signed)
O2 needs improved, continue to push mobility. Available PRN.  Erskine Emery MD PCCM

## 2021-03-07 NOTE — Progress Notes (Signed)
Modified Barium Swallow Progress Note  Patient Details  Name: Angel Cooper MRN: [id number] Date of Birth: Jul 05, 1930  Today's Date: 03/07/2021  Modified Barium Swallow completed.  Full report located under Chart Review in the Imaging Section.  Brief recommendations include the following:  Clinical Impression  Pt has a mild pharyngoesophageal dysphagia with no aspiration observed throughout [name]. Base of tongue retraction is mildly reduced, leading to incomplete epiglottic deflection and as a result, pt has mild valleculae residue and intermittent penetration of thin liquids during the swallow. Penetration clears spontaneously (PAS 2) at least during subsequent swallows. He has the appearance of a prominent CP as wella s reduced UES relaxation with suspected cervical osteophytes that could be contributing, but these do not appear to significantly impact bolus flow. Recommend continuing with regular solids and thin liquids with use of general aspiration precautions. Will f/u briefly for reinforcement and education as he remains inpatient.   Swallow Evaluation Recommendations       SLP Diet Recommendations: Regular solids;Thin liquid   Liquid Administration via: Cup;Straw   Medication Administration: Whole meds with liquid   Supervision: Patient able to self feed;Intermittent supervision to cue for compensatory strategies   Compensations: Slow rate;Small sips/bites   Postural Changes: Seated upright at 90 degrees;Remain semi-upright after after feeds/meals (Comment)   Oral Care Recommendations: Oral care BID        Osie Bond., M.A. [place] Acute Rehabilitation Services Pager [phone] Office [phone]  03/07/2021,2:02 PM

## 2021-03-07 NOTE — Progress Notes (Addendum)
Occupational Therapy Treatment Patient Details Name: Angel Cooper MRN: [id number] DOB: [birth year] Today's Date: 03/07/2021   History of present illness 88 y/o male admitted 03/01/21 for Abd pain and constipation x 3 days. Recently seen PCP 2 weeks ago after diagnosed with COVID and CXR showed PNA. Pt with SBO, Afib with RVR, and PNA. PMH: CKD, Afib, hx of renal resection due to RCC, HTN   OT comments  Pt making good progress with functional goals. Pt eager to work with therapy. Session focused on sitting EOB, sit - stand, SPTs, grooming, UB dressing and R UE ROM exercises 2 sets x 10 reps elbow and shoulder flexion. Pt pleasant and very appreciative of therapy services. OT will continue to follow acutely to maximize level of function and safety   Recommendations for follow up therapy are one component of a multi-disciplinary discharge planning process, led by the attending physician.  Recommendations may be updated based on patient status, additional functional criteria and insurance authorization.    Follow Up Recommendations  Skilled nursing-short term rehab (<3 hours/day)    Assistance Recommended at Discharge Frequent or constant Supervision/Assistance  Patient can return home with the following  A lot of help with bathing/dressing/bathroom;Two people to help with walking and/or transfers;Assist for transportation;Direct supervision/assist for medications management;Help with stairs or ramp for entrance   Equipment Recommendations  Other (comment) (TBD at SNF)    Recommendations for Other Services      Precautions / Restrictions Precautions Precautions: Fall;Other (comment) Precaution Comments: R elbow/handpain- gout, watch sats Restrictions Weight Bearing Restrictions: No       Mobility Bed Mobility Overal bed mobility: Needs Assistance Bed Mobility: Supine to Sit     Supine to sit: Min assist, HOB elevated     General bed mobility comments: increased time and effort,  min A to elevate trunk    Transfers Overall transfer level: Needs assistance Equipment used: 2 person hand held assist Transfers: Sit to/from Stand Sit to Stand: Mod assist, +2 physical assistance                 Balance Overall balance assessment: Needs assistance Sitting-balance support: No upper extremity supported, Feet supported   Sitting balance - Comments: static sitting once EOB with guarding   Standing balance support: During functional activity, Bilateral upper extremity supported Standing balance-Leahy Scale: Poor                             ADL either performed or assessed with clinical judgement   ADL Overall ADL's : Needs assistance/impaired Eating/Feeding: Set up;Supervision/ safety;Sitting;Bed level   Grooming: Wash/dry hands;Wash/dry face;Min guard;Sitting           Upper Body Dressing : Minimal assistance;Sitting       Toilet Transfer: Moderate assistance;+2 for safety/equipment;Stand-pivot;Cueing for safety;Cueing for sequencing Toilet Transfer Details (indicate cue type and reason): simulated to chair Toileting- Clothing Manipulation and Hygiene: Maximal assistance;Sitting/lateral lean Toileting - Clothing Manipulation Details (indicate cue type and reason): simulated seated in chair     Functional mobility during ADLs: Moderate assistance;+2 for physical assistance;Cueing for safety;Cueing for sequencing General ADL Comments: pt limted by generalized weakness and Poor activity tolerance    Extremity/Trunk Assessment Upper Extremity Assessment Upper Extremity Assessment: Generalized weakness   Lower Extremity Assessment Lower Extremity Assessment: Defer to PT evaluation   Cervical / Trunk Assessment Cervical / Trunk Assessment: Kyphotic    Vision Baseline Vision/History: 1 Wears glasses Ability to See  in Adequate Light: 0 Adequate Patient Visual Report: No change from baseline     Perception     Praxis       Cognition Arousal/Alertness: Awake/alert Behavior During Therapy: WFL for tasks assessed/performed Overall Cognitive Status: Impaired/Different from baseline                           Safety/Judgement: Decreased awareness of deficits, Decreased awareness of safety   Problem Solving: Requires verbal cues, Requires tactile cues          Exercises  L UE ROM exercises 2 sets x 10 reps elbow and shoulder flexion    Shoulder Instructions       General Comments      Pertinent Vitals/ Pain       Pain Assessment Pain Assessment: Faces Faces Pain Scale: Hurts a little bit Pain Location: R hand/elbow Pain Descriptors / Indicators: Aching, Discomfort, Grimacing, Tender Pain Intervention(s): Monitored during session, Repositioned  Home Living                                          Prior Functioning/Environment              Frequency  Min 2X/week        Progress Toward Goals  OT Goals(current goals can now be found in the care plan section)  Progress towards OT goals: Progressing toward goals     Plan Discharge plan remains appropriate    Co-evaluation                 AM-PAC OT "6 Clicks" Daily Activity     Outcome Measure   Help from another person eating meals?: None Help from another person taking care of personal grooming?: A Little Help from another person toileting, which includes using toliet, bedpan, or urinal?: Total Help from another person bathing (including washing, rinsing, drying)?: A Lot Help from another person to put on and taking off regular upper body clothing?: A Little Help from another person to put on and taking off regular lower body clothing?: Total 6 Click Score: 14    End of Session Equipment Utilized During Treatment: Gait belt  OT Visit Diagnosis: Unsteadiness on feet (R26.81);Other abnormalities of gait and mobility (R26.89);Muscle weakness (generalized) (M62.81);Pain;Other symptoms and  signs involving cognitive function Pain - Right/Left: Right Pain - part of body: Arm;Hand   Activity Tolerance Patient tolerated treatment well   Patient Left in bed;with call bell/phone within reach   Nurse Communication Mobility status        Time: 4680-3212 OT Time Calculation (min): 26 min  Charges: OT General Charges $OT Visit: 1 Visit OT Treatments $Self Care/Home Management : 8-22 mins    Britt Bottom 03/07/2021, 3:20 PM

## 2021-03-07 NOTE — Progress Notes (Signed)
ANTICOAGULATION CONSULT NOTE   Pharmacy Consult for  apixaban  Indication: atrial fibrillation  Patient Measurements: Height: 5\' 4"  (162.6 cm) Weight: 63.7 kg (140 lb 6.9 oz) IBW/kg (Calculated) : 59.2  Vital Signs: Temp: 97.4 F (36.3 C) (02/09 1554) Temp Source: Oral (02/09 1554) BP: 127/106 (02/09 1554) Pulse Rate: 91 (02/09 1554)  Labs: Recent Labs    03/05/21 0049 03/05/21 0801 03/06/21 0057 03/06/21 0402 03/07/21 0407  HGB 9.5*  --  9.2*  --  8.4*  HCT 29.5*  --  29.1*  --  25.3*  PLT 492*  --  518*  --  370  HEPARINUNFRC  --  0.48  --   --   --   CREATININE 1.69*  --  2.14*  --  1.74*  TROPONINIHS  --   --  277* 243*  --      Estimated Creatinine Clearance: 24.1 mL/min (A) (by C-G formula based on SCr of 1.74 mg/dL (H)).  Assessment: 88 yo M presents with small bowel obstruction. PMH significant for Afib. No anticoagulation PTA.  Transitioned from IV heparin to  Apixaban  (new) started 2/8/2. Later this was switched to Lovenox due to need for thoracentesis.  S/p thoracentesis done 2/8 PM.   No other procedures planned.  Pharmacy consulted 2/9  to resume Apixaban  (new med for patient).  First and last dose of Lovenox was given at 21:06 03/06/21.  AKI on CKDCKD3b: SCr 1.69>2.14>1.74,  baseline SCr~ 1.5.   Anemia of chronic disease: Hgb 9.5>9.2>8.4, stable pltc 518>370,  no bleeding.  Goal of Therapy:  Monitor platelets by anticoagulation protocol: Yes   Plan:  Discontinue Lovenox  now. Give Apixaban 2.5 mg BID, start tonight @2200 .  Monitor for s/sx of bleeding   Thank you for involving pharmacy in this patient's care.  Nicole Cella, RPh Clinical Pharmacist [phone] Clinical phone for 03/07/2021 until 10p is [phone] 03/07/2021 4:19 PM  **Pharmacist phone directory can be found on [email or web address] listed under [place]**

## 2021-03-07 NOTE — Progress Notes (Signed)
ANTICOAGULATION CONSULT NOTE   Pharmacy Consult for  apixaban  Indication: atrial fibrillation  Patient Measurements: Height: 5\' 4"  (162.6 cm) Weight: 63.7 kg (140 lb 6.9 oz) IBW/kg (Calculated) : 59.2  Vital Signs: Temp: 97.4 F (36.3 C) (02/09 1554) Temp Source: Oral (02/09 1554) BP: 127/106 (02/09 1554) Pulse Rate: 91 (02/09 1554)  Labs: Recent Labs    03/05/21 0049 03/05/21 0801 03/06/21 0057 03/06/21 0402 03/07/21 0407  HGB 9.5*  --  9.2*  --  8.4*  HCT 29.5*  --  29.1*  --  25.3*  PLT 492*  --  518*  --  370  HEPARINUNFRC  --  0.48  --   --   --   CREATININE 1.69*  --  2.14*  --  1.74*  TROPONINIHS  --   --  277* 243*  --      Estimated Creatinine Clearance: 24.1 mL/min (A) (by C-G formula based on SCr of 1.74 mg/dL (H)).  Assessment: 88 yo M presents with small bowel obstruction. PMH significant for Afib. No anticoagulation PTA.  Transitioned from IV heparin to  Apixaban  (new) started 2/8/2. Later this was switched to Lovenox due to need for thoracentesis.  S/p thoracentesis done 2/8 PM.   No other procedures planned.  Pharmacy consulted 2/9  to resume Apixaban  (new med for patient).  First and last dose of Lovenox was given at 21:06 03/06/21.  AKI on CKDCKD3b: SCr 1.69>2.14>1.74,  baseline SCr~ 1.5.   Anemia of chronic disease: Hgb 9.5>9.2>8.4, stable pltc 518>370,  no bleeding.  Age > 80,  Scr >1.5, thus appropriate apixaban dose is 2.5 mg BID.  Goal of Therapy:  Monitor platelets by anticoagulation protocol: Yes   Plan:  Discontinue Lovenox  now. Give Apixaban 2.5 mg BID, start tonight @2200 .  Monitor for s/sx of bleeding   Thank you for involving pharmacy in this patient's care.  Nicole Cella, RPh Clinical Pharmacist [phone] Clinical phone for 03/07/2021 until 10p is [phone] 03/07/2021 4:32 PM  **Pharmacist phone directory can be found on [email or web address] listed under [place]**

## 2021-03-08 LAB — CBC
HCT: 26.2 % — ABNORMAL LOW (ref 39.0–52.0)
Hemoglobin: 8.7 g/dL — ABNORMAL LOW (ref 13.0–17.0)
MCH: 29.2 pg (ref 26.0–34.0)
MCHC: 33.2 g/dL (ref 30.0–36.0)
MCV: 87.9 fL (ref 80.0–100.0)
Platelets: 343 10*3/uL (ref 150–400)
RBC: 2.98 MIL/uL — ABNORMAL LOW (ref 4.22–5.81)
RDW: 17.2 % — ABNORMAL HIGH (ref 11.5–15.5)
WBC: 18.8 10*3/uL — ABNORMAL HIGH (ref 4.0–10.5)
nRBC: 0 % (ref 0.0–0.2)

## 2021-03-08 LAB — BASIC METABOLIC PANEL
Anion gap: 11 (ref 5–15)
BUN: 53 mg/dL — ABNORMAL HIGH (ref 8–23)
CO2: 24 mmol/L (ref 22–32)
Calcium: 8 mg/dL — ABNORMAL LOW (ref 8.9–10.3)
Chloride: 102 mmol/L (ref 98–111)
Creatinine, Ser: 1.61 mg/dL — ABNORMAL HIGH (ref 0.61–1.24)
GFR, Estimated: 41 mL/min — ABNORMAL LOW (ref >60)
Glucose, Bld: 136 mg/dL — ABNORMAL HIGH (ref 70–99)
Potassium: 3.5 mmol/L (ref 3.5–5.1)
Sodium: 137 mmol/L (ref 135–145)

## 2021-03-08 LAB — PROCALCITONIN: Procalcitonin: 0.72 ng/mL

## 2021-03-08 MED ORDER — POTASSIUM CHLORIDE CRYS ER 20 MEQ PO TBCR
40.0000 meq | EXTENDED_RELEASE_TABLET | Freq: Once | ORAL | Status: AC
  Administered 2021-03-08: 40 meq via ORAL
  Filled 2021-03-08: qty 2

## 2021-03-08 MED ORDER — FUROSEMIDE 10 MG/ML IJ SOLN
40.0000 mg | Freq: Once | INTRAMUSCULAR | Status: AC
  Administered 2021-03-08: 40 mg via INTRAVENOUS
  Filled 2021-03-08: qty 4

## 2021-03-08 NOTE — Progress Notes (Signed)
Pt was seen with son in attendance to work on standing stabiltiy and LE strengthening.  Pt is quite weak, generally unable to control his balance without both dense cues for sequence and safety, as well as physical assist.  Follow along with him to get into gait as he can tolerate, progressing with AD as is appropriate.  Used Rollator today due to locking feature of brakes but this is not going to be a walking device with his instability standing.  Follow for goals of acute PT as are outlined.  03/08/21 1342  PT Visit Information  Last PT Received On 03/08/21  Assistance Needed +2  History of Present Illness 88 y/o male admitted 03/01/21 for Abd pain and constipation x 3 days. Recently seen PCP 2 weeks ago after diagnosed with COVID and CXR showed PNA. Pt with SBO, Afib with RVR, and PNA. PMH: CKD, Afib, hx of renal resection due to RCC, HTN  Subjective Data  Subjective reports he is motivated to stand, but not very strong  Patient Stated Goal to get stronger  Precautions  Precautions Fall;Other (comment)  Precaution Comments R elbow/handpain- gout, watch sats  Required Braces or Orthoses  (no brace)  Restrictions  Weight Bearing Restrictions No  Pain Assessment  Pain Assessment Faces  Faces Pain Scale 4  Breathing 0  Pain Location R hand/elbow  Pain Descriptors / Indicators Sore  Pain Intervention(s) Limited activity within patient's tolerance;Monitored during session;Premedicated before session;Repositioned  Cognition  Arousal/Alertness Awake/alert  Behavior During Therapy WFL for tasks assessed/performed  Overall Cognitive Status Impaired/Different from baseline  Area of Impairment Awareness;Safety/judgement  Safety/Judgement Decreased awareness of safety  Awareness Intellectual  Problem Solving Requires verbal cues;Requires tactile cues  General Comments pt is willing to use RUE as tolerated but hinders taking a step  Bed Mobility  Overal bed mobility Needs Assistance  General bed  mobility comments up in chair when PT arrived  Transfers  Overall transfer level Needs assistance  Equipment used Rollator (4 wheels);1 person hand held assist  Transfers Sit to/from Stand  Sit to Stand Mod assist  General transfer comment support on arms of chair are more helpful to stand, mod assist to power up with cues for transition of hands  Balance  Overall balance assessment Needs assistance  Sitting-balance support Feet supported  Sitting balance-Leahy Scale Fair  Standing balance support Bilateral upper extremity supported;During functional activity  Standing balance-Leahy Scale Poor  General Comments  General comments (skin integrity, edema, etc.) pt was assisted to stand and work on standing as well as sitting strengthening, met with his son who is involved and a caregiver  Exercises  Exercises General Lower Extremity  General Exercises - Lower Extremity  Toe Raises Strengthening;10 reps  Mini-Sqauts Strengthening;10 reps  Ankle Circles/Pumps AAROM;5 reps  Quad Sets AROM;10 reps  Gluteal Sets AROM;10 reps  Heel Slides Strengthening;10 reps  Hip ABduction/ADduction AROM;10 reps  Long Arc Quad Strengthening;10 reps  PT - End of Session  Equipment Utilized During Treatment Gait belt;Oxygen  Activity Tolerance Patient tolerated treatment well  Patient left in chair;with call bell/phone within reach  Nurse Communication Mobility status   PT - Assessment/Plan  PT Plan Current plan remains appropriate  PT Visit Diagnosis Unsteadiness on feet (R26.81);Muscle weakness (generalized) (M62.81);Difficulty in walking, not elsewhere classified (R26.2)  PT Frequency (ACUTE ONLY) Min 3X/week  Follow Up Recommendations Skilled nursing-short term rehab (<3 hours/day)  Assistance recommended at discharge Frequent or constant Supervision/Assistance  Patient can return home with the following Two  people to help with walking and/or transfers;Two people to help with  bathing/dressing/bathroom;Help with stairs or ramp for entrance;Assist for transportation  PT equipment Wheelchair (measurements PT);Wheelchair cushion (measurements PT);Hospital bed  AM-PAC PT "6 Clicks" Mobility Outcome Measure (Version 2)  Help needed turning from your back to your side while in a flat bed without using bedrails? 2  Help needed moving from lying on your back to sitting on the side of a flat bed without using bedrails? 2  Help needed moving to and from a bed to a chair (including a wheelchair)? 2  Help needed standing up from a chair using your arms (e.g., wheelchair or bedside chair)? 2  Help needed to walk in hospital room? 1  Help needed climbing 3-5 steps with a railing?  1  6 Click Score 10  Consider Recommendation of Discharge To: CIR/SNF/LTACH  Progressive Mobility  What is the highest level of mobility based on the progressive mobility assessment? Level 3 (Stands with assist) - Balance while standing  and cannot march in place  Activity Transferred from bed to chair  PT Goal Progression  Progress towards PT goals Progressing toward goals  PT Time Calculation  PT Start Time (ACUTE ONLY) 1203  PT Stop Time (ACUTE ONLY) 1231  PT Time Calculation (min) (ACUTE ONLY) 28 min  PT General Charges  $$ ACUTE PT VISIT 1 Visit  PT Treatments  $Therapeutic Exercise 8-22 mins  $Therapeutic Activity 8-22 mins    Mee Hives, PT PhD Acute Rehab Dept. Number: [place] and [place]

## 2021-03-08 NOTE — Progress Notes (Signed)
Speech Language Pathology Treatment: Dysphagia  °Patient Details °Name: Angel Cooper °MRN: [id number] °DOB: 07/04/1930 °Today's Date: 03/08/2021 °Time: 0854-0908 °SLP Time Calculation (min) (ACUTE ONLY): 14 min ° °Assessment / Plan / Recommendation °Clinical Impression ° Pt has minimal baseline coughing this morning, noted once after initial sips of orange juice but then not observed again in correlation with PO intake. He remembers participating in MBS on previous date, but needs assistance in recalling results and recommendations. SLP provided education about precautions in light of decreased respiratory status, esophageal hx, and current deconditioning. Pt verbalized his understanding and demonstrated use of precautions well during breakfast meal. Will s/o for now. Please reorder with any acute changes.  °  °HPI HPI: 88 y/o male admitted 03/01/21 for Abd pain and constipation x 3 days. Recently seen PCP 2 weeks ago after diagnosed with COVID and CXR showed PNA. Per PCCM note 03/06/21, there is suspicion for silent reflux/aspiration due to known large hiatal hernia. Pt with SBO, Afib with RVR, and PNA. PMH: GERD, CKD, Afib, hx of renal resection due to RCC, HTN °  °   °SLP Plan ° All goals met ° °  °  °Recommendations for follow up therapy are one component of a multi-disciplinary discharge planning process, led by the attending physician.  Recommendations may be updated based on patient status, additional functional criteria and insurance authorization. °  ° °Recommendations  °Diet recommendations: Regular;Thin liquid °Liquids provided via: Cup;Straw °Medication Administration: Whole meds with liquid °Supervision: Patient able to self feed;Intermittent supervision to cue for compensatory strategies °Compensations: Slow rate;Small sips/bites °Postural Changes and/or Swallow Maneuvers: Seated upright 90 degrees;Upright 30-60 min after meal  °   °    °   ° ° ° ° Oral Care Recommendations: Oral care BID °Follow Up  Recommendations: No SLP follow up °Assistance recommended at discharge: PRN °SLP Visit Diagnosis: Dysphagia, pharyngoesophageal phase (R13.14) °Plan: All goals met ° ° ° ° °  °  ° ° °Laura N., M.A. CCC-SLP °Acute Rehabilitation Services °Pager [phone] °Office [phone] ° °03/08/2021, 10:12 AM °

## 2021-03-08 NOTE — TOC Progression Note (Signed)
Transition of Care [facility]) - Progression Note    Patient Details  Name: Angel Cooper MRN: [id number] Date of Birth: Dec 14, 1930  Transition of Care [facility]) CM/SW [place], LCSW Phone Number: 03/08/2021, 4:43 PM  Clinical Narrative:    CSW spoke with patient's son and made him aware that [facility] will likely need him for paperwork on Monday pending insurance approval and medical stability.    Expected Discharge Plan: [place] Barriers to Discharge: Continued Medical Work up, Ship broker  Expected Discharge Plan and Services Expected Discharge Plan: [place] In-house Referral: Clinical Social Work   Post Acute Care Choice: [place] Living arrangements for the past 2 months: Single Family Home                                       Social Determinants of Health (SDOH) Interventions    Readmission Risk Interventions No flowsheet data found.

## 2021-03-08 NOTE — Progress Notes (Signed)
PROGRESS NOTE    Angel Cooper  TDV:[id number] DOB: [birth year] DOA: 03/01/2021 PCP: [name], MD   Chief Complaint  Patient presents with   Abdominal Pain    Brief Narrative:    88 year old male with CKD 3B, paroxysmal A. fib, history of renal resection due to [place], HTN, HLD comes into the hospital with abdominal pain for the past 3 days.  Apparently he was diagnosed with COVID as an outpatient about 2 weeks ago, was prescribed medication but never took them.  He saw his PCP a few days ago, had some respiratory symptoms and a chest x-ray showed pneumonia.  He then developed abdominal discomfort and came to the hospital.  He was found to have a small bowel obstruction, A. fib with RVR as well as left lobar pneumonia.  General surgery consulted.  He improved with conservative management and his NG tube was discontinued 2/6.  PT recommends SNF - as of 2/7, patient with progressive dyspnea, increased work of breathing, he did require NRB, transition during the day to 15 L high flow nasal cannula, work-up significant for volume overload, with large left> right pleural effusion.  Assessment & Plan:   Principal Problem:   Partial small bowel obstruction (HCC) Active Problems:   Acute respiratory failure with hypoxia (HCC)   Atrial fibrillation with RVR (HCC)   CAD (coronary artery disease) of artery bypass graft   Chronic kidney disease, stage 3b (HCC)   Leukocytosis   Essential hypertension   Anemia of chronic disease   Personal history of other malignant neoplasm of kidney   CAP (community acquired pneumonia)   History of COVID-19   Right shoulder pain due to gout   Protein-calorie malnutrition, severe   Acute on chronic combined systolic and diastolic CHF (congestive heart failure) (HCC)   Pleural effusion  Acute hypoxic respiratory failure -2/7 patient developed significant dyspnea, hypoxia with oxygen requirement up to 15 L high flow nasal cannula. -This is exacerbated by  volume overload and pleural effusion, with underlying  atelectasis and severe deconditioning -Improved after thoracentesis 2/8 -Continue to taper oxygen as tolerated, lowered to 4 L oxygen today, continue with diuresis as needed.  Pleural effusion -CT chest this morning significant for bilateral pleural effusion,. -Input greatly appreciated, status post thoracentesis with 500 cc pleural fluid out of right pleural space  -Was encouraged to use incentive spirometer, flutter valve .   Partial small bowel obstruction ([place])- (present on admission) - General surgery consulted and followed patient while hospitalized.  He was treated with conservative management with NG tube, IV fluids, n.p.o.  He improved, NG tube was discontinued 2/6 and his diet is slowly advanced.  He is doing better this morning -Tolerating soft diet, no nausea, no vomiting  Atrial fibrillation with RVR (HCC)- (present on admission) - Patient was started on Cardizem infusion while strict n.p.o.,, once his diet was advanced 2/6 he was placed on oral Cardizem 30 every 6.  He is off IV infusion, heart rate much better controlled.  Will consolidate and switch to 60 mg Q2 and potentially on discharge could go to 120 every 24 extended release.  He was on heparin infusion while n.p.o. and converted to Eliquis.  He was not on anticoagulation prior to admission.  I discussed with patient's son over the phone, they are not sure why he is not on anticoagulation but believe he may have not "felt good" in the past but they do not remember exactly.  He has not had any falls  and is otherwise active, somewhat limited by his knees.  Start Eliquis   CAP (community acquired pneumonia) -Started on antibiotics with ceftriaxone and azithromycin, continue for a total of 5 days.     AKI on Chronic kidney disease, stage 3b ([place])- (present on admission) - Baseline creatinine around 1.5.  Currently at baseline.   -Continued up to 2.1 as he received IV Lasix  overnight .   Essential hypertension- (present on admission) - Currently on furosemide, diltiazem.  Stable   Anemia of chronic disease- (present on admission) -Hemoglobin stable, no bleeding   Acute on Chronic combined systolic and diastolic heart failure ([place]) - 2D echo done this hospitalization shows an EF of 35-40%.  Patient has a history of coronary artery disease, and CABG in 1999.  His most recent 2D echo in the system is in 2015 and EF at that time was 45 to 50% with diffuse hypokinesis.   -Continue with diuresis as tolerated, I have started on low-dose midodrine to allow more room in blood pressure for diuresis.  He is on 80 mg oral daily of Lasix and being dosed as needed IV Lasix, most recent was 40 mg IV yesterday, will hold on further IV Lasix today given soft blood pressure.     Right shoulder pain due to gout - Started on 2/5, patient reports history of gouty attack at various sites of the body, this reminds him of one.  Received 125 Solu-Medrol 2/6, started on colchicine, significantly better today, he can move his arm now.  Switch to prednisone do a short 4-day course   History of COVID-19 -Son at bedside, reports father tested positive for 1 /13 / 2023, he was prescribed oral antiviral by PCP but he did not take.  . -Patient is 21 days currently from his initial infection, no need for COVID isolation, isolation has been discontinued . will discontinue.  .  Personal history of other malignant neoplasm of kidney - Noted   Leukocytosis - Due to pneumonia as well as steroid use   CAD (coronary artery disease) of artery bypass graft- (present on admission) - No chest pain, history of CABG in 1999  DVT prophylaxis: Eliquis>> lovenox Code Status: Full Family Communication: D/W son at bedside  Disposition:   Status is: Inpatient            Consultants:  PCCM   Subjective:  Patient reports he is feeling better, dyspnea has improved, appetite has improved as  well.  Objective: Vitals:   03/08/21 0400 03/08/21 0500 03/08/21 0758 03/08/21 1156  BP: (!) 99/54  103/79 (!) 115/53  Pulse: 66  76 88  Resp: 20  (!) 25 (!) 25  Temp: 97.9 F (36.6 C)  97.9 F (36.6 C) 97.6 F (36.4 C)  TempSrc: Axillary  Oral Oral  SpO2: 98%  96% 94%  Weight:  57.3 kg    Height:        Intake/Output Summary (Last 24 hours) at 03/08/2021 1204 Last data filed at 03/08/2021 0800 Gross per 24 hour  Intake --  Output 1175 ml  Net -1175 ml   Filed Weights   03/05/21 0433 03/06/21 0500 03/08/21 0500  Weight: 63 kg 63.7 kg 57.3 kg    Examination:   Awake Alert, Oriented X 3, frail and deconditioned Symmetrical Chest wall movement, diminished air entry at the bases RRR,No Gallops,Rubs or new Murmurs, No Parasternal Heave +ve B.Sounds, Abd Soft, No tenderness, No rebound - guarding or rigidity. No Cyanosis, Clubbing or edema,  No new Rash or bruise       Data Reviewed: I have personally reviewed following labs and imaging studies  CBC: Recent Labs  Lab 03/01/21 1753 03/02/21 0227 03/04/21 0208 03/05/21 0049 03/06/21 0057 03/07/21 0407 03/08/21 0308  WBC 15.5*   < > 17.8* 23.1* 25.2* 18.1* 18.8*  NEUTROABS 13.1*  --   --   --   --   --   --   HGB 11.3*   < > 8.6* 9.5* 9.2* 8.4* 8.7*  HCT 35.4*   < > 27.3* 29.5* 29.1* 25.3* 26.2*  MCV 88.5   < > 90.4 90.5 89.3 88.2 87.9  PLT 686*   < > 486* 492* 518* 370 343   < > = values in this interval not displayed.    Basic Metabolic Panel: Recent Labs  Lab 03/01/21 1753 03/02/21 0226 03/03/21 1147 03/04/21 0208 03/05/21 0049 03/06/21 0057 03/07/21 0407 03/08/21 0308  NA 133* 135   < > 137 136 134* 136 137  K 3.8 4.1   < > 3.5 4.5 3.9 3.4* 3.5  CL 97* 99   < > 104 102 100 104 102  CO2 24 24   < > 22 21* 19* 23 24  GLUCOSE 118* 111*   < > 71 218* 228* 122* 136*  BUN 31* 27*   < > 26* 40* 58* 59* 53*  CREATININE 1.51* 1.38*   < > 1.58* 1.69* 2.14* 1.74* 1.61*  CALCIUM 8.7* 8.2*   < > 8.0* 8.4*  8.2* 8.0* 8.0*  MG 1.6* 2.0  --   --   --   --   --   --    < > = values in this interval not displayed.    GFR: Estimated Creatinine Clearance: 25.2 mL/min (A) (by C-G formula based on SCr of 1.61 mg/dL (H)).  Liver Function Tests: Recent Labs  Lab 03/01/21 1753 03/02/21 0226 03/06/21 0057  AST 20 15 201*  ALT 6 7 52*  ALKPHOS 71 61 79  BILITOT 0.8 0.4 0.2*  PROT 6.6 5.7* 6.0*  ALBUMIN 3.1* 2.6* 2.6*    CBG: No results for input(s): GLUCAP in the last 168 hours.   Recent Results (from the past 240 hour(s))  Blood culture (routine x 2)     Status: None   Collection Time: 03/01/21  7:00 PM   Specimen: BLOOD RIGHT ARM  Result Value Ref Range Status   Specimen Description BLOOD RIGHT ARM  Final   Special Requests   Final    BOTTLES DRAWN AEROBIC AND ANAEROBIC Blood Culture results may not be optimal due to an excessive volume of blood received in culture bottles   Culture   Final    NO GROWTH 5 DAYS Performed at [facility], [place] [address] [place], [place] [address]    Report Status 03/06/2021 FINAL  Final  Blood culture (routine x 2)     Status: None   Collection Time: 03/01/21  7:05 PM   Specimen: BLOOD LEFT ARM  Result Value Ref Range Status   Specimen Description BLOOD LEFT ARM  Final   Special Requests   Final    BOTTLES DRAWN AEROBIC AND ANAEROBIC Blood Culture adequate volume   Culture   Final    NO GROWTH 5 DAYS Performed at [facility], [place] [address], [place], [place] [address]    Report Status 03/06/2021 FINAL  Final  Respiratory (~20 pathogens) panel by PCR  Status: None   Collection Time: 03/01/21  8:11 PM   Specimen: Nasopharyngeal Swab; Respiratory  Result Value Ref Range Status   Adenovirus NOT DETECTED NOT DETECTED Final   Coronavirus 229E NOT DETECTED NOT DETECTED Final    Comment: (NOTE) The Coronavirus on the Respiratory Panel, DOES NOT test for the novel  Coronavirus (2019 nCoV)    Coronavirus HKU1 NOT DETECTED NOT  DETECTED Final   Coronavirus NL63 NOT DETECTED NOT DETECTED Final   Coronavirus OC43 NOT DETECTED NOT DETECTED Final   Metapneumovirus NOT DETECTED NOT DETECTED Final   Rhinovirus / Enterovirus NOT DETECTED NOT DETECTED Final   Influenza A NOT DETECTED NOT DETECTED Final   Influenza B NOT DETECTED NOT DETECTED Final   Parainfluenza Virus 1 NOT DETECTED NOT DETECTED Final   Parainfluenza Virus 2 NOT DETECTED NOT DETECTED Final   Parainfluenza Virus 3 NOT DETECTED NOT DETECTED Final   Parainfluenza Virus 4 NOT DETECTED NOT DETECTED Final   Respiratory Syncytial Virus NOT DETECTED NOT DETECTED Final   Bordetella pertussis NOT DETECTED NOT DETECTED Final   Bordetella Parapertussis NOT DETECTED NOT DETECTED Final   Chlamydophila pneumoniae NOT DETECTED NOT DETECTED Final   Mycoplasma pneumoniae NOT DETECTED NOT DETECTED Final    Comment: Performed at [facility], [place]. [address] [place], [place] [address]  Resp Panel by RT-PCR (Flu A&B, Covid) Nasopharyngeal Swab     Status: Abnormal   Collection Time: 03/02/21  3:12 AM   Specimen: Nasopharyngeal Swab; Nasopharyngeal(NP) swabs in vial transport medium  Result Value Ref Range Status   SARS Coronavirus 2 by RT PCR POSITIVE (A) NEGATIVE Final    Comment: (NOTE) SARS-CoV-2 target nucleic acids are DETECTED.  The SARS-CoV-2 RNA is generally detectable in upper respiratory specimens during the acute phase of infection. Positive results are indicative of the presence of the identified virus, but do not rule out bacterial infection or co-infection with other pathogens not detected by the test. Clinical correlation with patient history and other diagnostic information is necessary to determine patient infection status. The expected result is Negative.  Fact Sheet for Patients: EntrepreneurPulse.com.au  Fact Sheet for Healthcare Providers: IncredibleEmployment.be  This test is not yet approved or  cleared by the [place] FDA and  has been authorized for detection and/or diagnosis of SARS-CoV-2 by FDA under an Emergency Use Authorization (EUA).  This EUA will remain in effect (meaning this test can be used) for the duration of  the COVID-19 declaration under Section 564(b)(1) of the A ct, 21 U.S.C. section 360bbb-3(b)(1), unless the authorization is terminated or revoked sooner.     Influenza A by PCR NEGATIVE NEGATIVE Final   Influenza B by PCR NEGATIVE NEGATIVE Final    Comment: (NOTE) The Xpert Xpress SARS-CoV-2/FLU/RSV plus assay is intended as an aid in the diagnosis of influenza from Nasopharyngeal swab specimens and should not be used as a sole basis for treatment. Nasal washings and aspirates are unacceptable for Xpert Xpress SARS-CoV-2/FLU/RSV testing.  Fact Sheet for Patients: EntrepreneurPulse.com.au  Fact Sheet for Healthcare Providers: IncredibleEmployment.be  This test is not yet approved or cleared by the [place] FDA and has been authorized for detection and/or diagnosis of SARS-CoV-2 by FDA under an Emergency Use Authorization (EUA). This EUA will remain in effect (meaning this test can be used) for the duration of the COVID-19 declaration under Section 564(b)(1) of the Act, 21 U.S.C. section 360bbb-3(b)(1), unless the authorization is terminated or revoked.  Performed at [facility],  [address] [place], [place] [address]   Body fluid culture w Gram Stain     Status: None (Preliminary result)   Collection Time: 03/06/21  3:35 PM   Specimen: Pleural Fluid  Result Value Ref Range Status   Specimen Description FLUID PLEURAL  Final   Special Requests NONE  Final   Gram Stain   Final    RARE WBC PRESENT,BOTH PMN AND MONONUCLEAR NO ORGANISMS SEEN    Culture   Final    NO GROWTH 2 DAYS Performed at [facility], [place] [address], [place], [place] [address]    Report Status PENDING  Incomplete          Radiology Studies: DG Chest 1 View  Result Date: 03/06/2021 CLINICAL DATA:  Follow-up pleural effusion. EXAM: CHEST  1 VIEW COMPARISON:  Prior today FINDINGS: Small left pleural effusion shows decreased size since prior study. No pneumothorax visualized. Elevated left hemidiaphragm again seen. Atelectasis or infiltrate in left lung base is unchanged. Right upper lobe airspace disease is also stable. Gas again seen within moderate to large hiatal hernia. Stable cardiomegaly.  Prior CABG again noted. IMPRESSION: Decreased size of small left pleural effusion. No pneumothorax visualized. Stable left lower lobe atelectasis versus infiltrate. Stable right upper lobe airspace disease. Electronically Signed   By: Marlaine Hind M.D.   On: 03/06/2021 15:57   DG Swallowing Func-Speech Pathology  Result Date: 03/07/2021 Table formatting from the original result was not included. Objective Swallowing Evaluation: Type of Study: MBS-Modified Barium Swallow Study  Patient Details Name: TIRSO LAWS MRN: [id number] Date of Birth: 01/31/1930 Today's Date: 03/07/2021 Time: SLP Start Time (ACUTE ONLY): 9417 -SLP Stop Time (ACUTE ONLY): 1218 SLP Time Calculation (min) (ACUTE ONLY): 19 min Past Medical History: Past Medical History: Diagnosis Date  Atrial fibrillation ([place])   2015  W/ RVR  Bladder tumor   CHF (congestive heart failure) ([place]) 2015  CKD (chronic kidney disease), stage III ([place])   Coronary artery disease   NO LONGER SEES CARDIOLOGIST - LAST SAW DR. 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[facility]) urologist-  dr herrick/  oncologist- dr [name]  dx 2014  s/p  right nephroureterectomy 01-24-2013 (T3, N0) invasive high grade urothelial carcinoma--  Past Surgical History: Past Surgical History: Procedure Laterality Date  AMPUTATION FINGER Left 12/07/2018  Procedure: Amputation Left Long Finger;  Surgeon: Roseanne Kaufman, MD;  Location: [place];  Service: Orthopedics;  Laterality: Left;  CARDIAC CATHETERIZATION  07/05/1999  non-q MI;  3vessel CAD w/ high grade pLAD and mLAD, subtotal ramus branch,  OM1 70-80%,  ostialCFX 50-60%,  dCFx 80%,  RI 99%,  total occluded pRCA and fills last by right-to-right collaterals,  pLM  20% narrowing;  severe inferoapical hypokinesis  CATARACT EXTRACTION W/ INTRAOCULAR LENS  IMPLANT, BILATERAL    CORONARY ARTERY BYPASS GRAFT  06/ 2001  dr [name] tright  CYSTOSCOPY W/ RETROGRADES Left 01/31/2016  Procedure: CYSTOSCOPY WITH RETROGRADE PYELOGRAM;  Surgeon: [name], MD;  Location: [place];  Service: Urology;  Laterality: Left;  CYSTOSCOPY WITH RETROGRADE PYELOGRAM, URETEROSCOPY AND STENT PLACEMENT Right 12/03/2012  Procedure: CYSTOSCOPY WITH RIGHT RETROGRADE PYELOGRAM,RIGHT URETERAL WASHINGS, RIGHT URETERAL BIOPSY, RIGHT URETEROSCOPY AND STENT PLACEMENT;  Surgeon: [name], MD;  Location: [facility];  Service: Urology;  Laterality: Right;  CYSTOSCOPY WITH RETROGRADE PYELOGRAM, URETEROSCOPY AND STENT PLACEMENT Right 12/15/2012  Procedure: CYSTOSCOPY WITH RIGHT RETROGRADE PYELOGRAM, POSSIBLE RIGHT URETEROSCOPY AND STENT PLACEMENT AND REMOVAL OF LEFT STENT;  Surgeon: [name], MD;  Location[facility];  Service: Urology;  Laterality: Right;  CYSTOSCOPY WITH STENT PLACEMENT Right 01/24/2013  Procedure: CYSTOSCOPY WITH STENT PLACEMENT;  Surgeon: [name], MD;  Location[facility];  Service: Urology;  Laterality: Right;  I & D EXTREMITY Left 12/07/2018  Procedure: IRRIGATION AND DEBRIDEMENT EXTREMITY;  Surgeon:  Roseanne Kaufman, MD;  Location: [place];  Service: Orthopedics;  Laterality: Left;  INCISION AND DRAINAGE ABSCESS Left 06/02/2016  Procedure: INCISION AND DRAINAGE LEFT SMALL FINGER;  Surgeon: [name], MD;  Location: [place];  Service: Orthopedics;  Laterality: Left;  ROBOT ASSITED LAPAROSCOPIC NEPHROURETERECTOMY Right 01/24/2013  Procedure: ROBOT ASSISTED LAPAROSCOPIC NEPHROURETERECTOMY WITH BILATERAL LYMPH NODE DISSECTION ;  Surgeon: [name], MD;  Location: [facility];  Service: Urology;  Laterality: Right;  right kidney  TRANSTHORACIC ECHOCARDIOGRAM  01/28/2013  mild LVH,  ef 45-50%, diffuse hypokinesis,  grade 1 diastolic dysfunction/  moderate LAE/  trivial PR/  mild TR, PASP 50-23mmHg  TRANSURETHRAL RESECTION OF BLADDER TUMOR N/A 01/31/2016  Procedure: TRANSURETHRAL RESECTION OF BLADDER TUMOR (TURBT);  Surgeon: [name], MD;  [facility];  Service: Urology;  Laterality: N/A; HPI: 88 y/o male admitted 03/01/21 for Abd pain and constipation x 3 days. Recently seen PCP 2 weeks ago after diagnosed with COVID and CXR showed PNA. Per PCCM note 03/06/21, there is suspicion for silent reflux/aspiration due to known large hiatal hernia. Pt with SBO, Afib with RVR, and PNA. PMH: GERD, CKD, Afib, hx of renal resection due to [place], HTN  Subjective: denies trouble swallowing  Recommendations for follow up therapy are one component of a multi-disciplinary discharge planning process, led by the attending physician.  Recommendations may be updated based on patient status, additional functional criteria and insurance authorization. Assessment / Plan / Recommendation Clinical Impressions 03/07/2021 Clinical Impression Pt has a mild pharyngoesophageal dysphagia with no aspiration observed throughout the study. Base of tongue retraction is mildly reduced, leading to incomplete epiglottic deflection and as a result, pt has mild valleculae residue and intermittent penetration of  thin liquids during the swallow. Penetration clears spontaneously (PAS 2) at least during subsequent swallows. He has the appearance of a prominent CP as wella s reduced UES relaxation with suspected cervical osteophytes that could be contributing, but these do not appear to significantly impact bolus flow. Recommend continuing with regular solids and thin liquids with use of general aspiration precautions. Will f/u briefly for reinforcement and education as he remains inpatient. SLP Visit Diagnosis Dysphagia, pharyngoesophageal phase (R13.14) Attention and concentration deficit following -- Frontal lobe and executive function deficit following -- Impact on safety and function Mild aspiration risk   Treatment Recommendations 03/07/2021 Treatment Recommendations Therapy as outlined in treatment plan below   Prognosis 03/07/2021 Prognosis for Safe Diet Advancement Good Barriers to Reach Goals -- Barriers/Prognosis Comment -- Diet Recommendations 03/07/2021 SLP Diet Recommendations Regular solids;Thin liquid Liquid Administration via Cup;Straw Medication Administration Whole meds with liquid Compensations Slow rate;Small sips/bites  Postural Changes Seated upright at 90 degrees;Remain semi-upright after after feeds/meals (Comment)   Other Recommendations 03/07/2021 Recommended Consults -- Oral Care Recommendations Oral care BID Other Recommendations -- Follow Up Recommendations No SLP follow up Assistance recommended at discharge PRN Functional Status Assessment Patient has not had a recent decline in their functional status Frequency and Duration  03/07/2021 Speech Therapy Frequency (ACUTE ONLY) min 1 x/week Treatment Duration 1 week   Oral Phase 03/07/2021 Oral Phase WFL Oral - Pudding Teaspoon -- Oral - Pudding Cup -- Oral - Honey Teaspoon -- Oral - Honey Cup -- Oral - Nectar Teaspoon -- Oral - Nectar Cup -- Oral - Nectar Straw -- Oral - Thin Teaspoon -- Oral - Thin Cup -- Oral - Thin Straw -- Oral - Puree -- Oral - Mech Soft --  Oral - Regular -- Oral - Multi-Consistency -- Oral - Pill -- Oral Phase - Comment --  Pharyngeal Phase 03/07/2021 Pharyngeal Phase Impaired Pharyngeal- Pudding Teaspoon -- Pharyngeal -- Pharyngeal- Pudding Cup -- Pharyngeal -- Pharyngeal- Honey Teaspoon -- Pharyngeal -- Pharyngeal- Honey Cup -- Pharyngeal -- Pharyngeal- Nectar Teaspoon -- Pharyngeal -- Pharyngeal- Nectar Cup -- Pharyngeal -- Pharyngeal- Nectar Straw -- Pharyngeal -- Pharyngeal- Thin Teaspoon -- Pharyngeal -- Pharyngeal- Thin Cup Reduced tongue base retraction;Reduced epiglottic inversion;Pharyngeal residue - valleculae;Penetration/Aspiration during swallow Pharyngeal Material enters airway, remains ABOVE vocal cords then ejected out Pharyngeal- Thin Straw Reduced tongue base retraction;Reduced epiglottic inversion;Pharyngeal residue - valleculae;Penetration/Aspiration during swallow Pharyngeal Material enters airway, remains ABOVE vocal cords then ejected out Pharyngeal- Puree Reduced tongue base retraction;Reduced epiglottic inversion;Pharyngeal residue - valleculae Pharyngeal -- Pharyngeal- Mechanical Soft -- Pharyngeal -- Pharyngeal- Regular Reduced tongue base retraction;Reduced epiglottic inversion;Pharyngeal residue - valleculae Pharyngeal -- Pharyngeal- Multi-consistency -- Pharyngeal -- Pharyngeal- Pill Reduced tongue base retraction;Reduced epiglottic inversion Pharyngeal -- Pharyngeal Comment --  Cervical Esophageal Phase  03/07/2021 Cervical Esophageal Phase Impaired Pudding Teaspoon -- Pudding Cup -- Honey Teaspoon -- Honey Cup -- Nectar Teaspoon -- Nectar Cup -- Nectar Straw -- Thin Teaspoon -- Thin Cup Reduced cricopharyngeal relaxation;Prominent cricopharyngeal segment Thin Straw Reduced cricopharyngeal relaxation;Prominent cricopharyngeal segment Puree Reduced cricopharyngeal relaxation;Prominent cricopharyngeal segment Mechanical Soft -- Regular Reduced cricopharyngeal relaxation;Prominent cricopharyngeal segment Multi-consistency --  Pill Reduced cricopharyngeal relaxation;Prominent cricopharyngeal segment Cervical Esophageal Comment -- Osie Bond., M.A. CCC-SLP Acute Rehabilitation Services Pager [phone] Office [phone] 03/07/2021, 2:07 PM                     VAS [place] LOWER EXTREMITY VENOUS (DVT)  Result Date: 03/07/2021  Lower Venous DVT Study Patient Name:  LUAN MABERRY  Date of Exam:   03/07/2021 Medical Rec #: [id number]          Accession #:    [id number] Date of Birth: Sep 11, 1930           Patient Gender: M Patient Age:   27 years Exam Location:  [facility] Procedure:      VAS [place] LOWER EXTREMITY VENOUS (DVT) Referring Phys: Nevin Bloodgood SIMPSON --------------------------------------------------------------------------------  Indications: Hypoxia.  Risk Factors: None identified. Limitations: Body habitus. Comparison Study: No prior studies. Performing Technologist: Oliver Hum RVT  Examination Guidelines: A complete evaluation includes B-mode imaging, spectral Doppler, color Doppler, and power Doppler as needed of all accessible portions of each vessel. Bilateral testing is considered an integral part of a complete examination. Limited examinations for reoccurring indications may be performed as noted. The reflux portion of the exam is performed with the patient in reverse Trendelenburg.  +---------+---------------+---------+-----------+----------+--------------+  RIGHT  Compressibility Phasicity Spontaneity Properties Thrombus Aging  +---------+---------------+---------+-----------+----------+--------------+  CFV       Full            Yes       No                                     +---------+---------------+---------+-----------+----------+--------------+  SFJ       Full                                                             +---------+---------------+---------+-----------+----------+--------------+  FV Prox   Full                                                              +---------+---------------+---------+-----------+----------+--------------+  FV Mid    Full                                                             +---------+---------------+---------+-----------+----------+--------------+  FV Distal Full                                                             +---------+---------------+---------+-----------+----------+--------------+  PFV       Full                                                             +---------+---------------+---------+-----------+----------+--------------+  POP       Full            Yes       No                                     +---------+---------------+---------+-----------+----------+--------------+  PTV       Full                                                             +---------+---------------+---------+-----------+----------+--------------+  PERO      Full                                                             +---------+---------------+---------+-----------+----------+--------------+   +---------+---------------+---------+-----------+----------+--------------+  LEFT      Compressibility Phasicity Spontaneity Properties Thrombus Aging  +---------+---------------+---------+-----------+----------+--------------+  CFV       Full            Yes       No                                     +---------+---------------+---------+-----------+----------+--------------+  SFJ       Full                                                             +---------+---------------+---------+-----------+----------+--------------+  FV Prox   Full                                                             +---------+---------------+---------+-----------+----------+--------------+  FV Mid    Full                                                             +---------+---------------+---------+-----------+----------+--------------+  FV Distal Full                                                              +---------+---------------+---------+-----------+----------+--------------+  PFV       Full                                                             +---------+---------------+---------+-----------+----------+--------------+  POP       Full            Yes       No                                     +---------+---------------+---------+-----------+----------+--------------+  PTV       Full                                                             +---------+---------------+---------+-----------+----------+--------------+  PERO      Full                                                             +---------+---------------+---------+-----------+----------+--------------+  Summary: RIGHT: - There is no evidence of deep vein thrombosis in the lower extremity.  - No cystic structure found in the popliteal fossa.  LEFT: - There is no evidence of deep vein thrombosis in the lower extremity.  - No cystic structure found in the popliteal fossa.  *See table(s) above for measurements and observations. Electronically signed by Monica Martinez MD on 03/07/2021 at 6:40:18 PM.    Final         Scheduled Meds:  allopurinol  50 mg Oral Daily   apixaban  2.5 mg Oral BID   diltiazem  60 mg Oral Q12H   docusate sodium  100 mg Oral BID   feeding supplement  237 mL Oral TID BM   furosemide  80 mg Oral Daily   mouth rinse  15 mL Mouth Rinse BID   midodrine  5 mg Oral BID WC   multivitamin  1 tablet Oral QHS   pantoprazole  40 mg Oral Q1200   polyethylene glycol  17 g Oral Daily   Continuous Infusions:     LOS: 7 days        Phillips Climes, MD [facility]   To contact the attending provider between 7A-7P or the covering provider during after hours 7P-7A, please log into the web site [email or web address] and access using universal [place] password for that web site. If you do not have the password, please call the hospital operator.  03/08/2021, 12:04 PM

## 2021-03-08 NOTE — Progress Notes (Signed)
Occupational Therapy Treatment Patient Details Name: Angel Cooper MRN: [id number] DOB: 17-Nov-1930 Today's Date: 03/08/2021   History of present illness 88 y/o male admitted 03/01/21 for Abd pain and constipation x 3 days. Recently seen PCP 2 weeks ago after diagnosed with COVID and CXR showed PNA. Pt with SBO, Afib with RVR, and PNA. PMH: CKD, Afib, hx of renal resection due to RCC, HTN   OT comments  Pt continues to make progress with functional goals, appears to be tired today and states that he feels down, family member present. Session focused on sitting EOB, sit - stand , SPTs, UB dressing, grooming and R UE AROM exercises. Pt seemed to be in better spirits up after sitting in chair. OT will continue to follow acutely to maximize level of function and safety   Recommendations for follow up therapy are one component of a multi-disciplinary discharge planning process, led by the attending physician.  Recommendations may be updated based on patient status, additional functional criteria and insurance authorization.    Follow Up Recommendations  Skilled nursing-short term rehab (<3 hours/day)    Assistance Recommended at Discharge    Patient can return home with the following  A lot of help with bathing/dressing/bathroom;Two people to help with walking and/or transfers;Assist for transportation;Direct supervision/assist for medications management;Help with stairs or ramp for entrance   Equipment Recommendations  Other (comment) (TBD at SNF)    Recommendations for Other Services      Precautions / Restrictions Precautions Precautions: Fall;Other (comment) Precaution Comments: R elbow/handpain- gout, watch sats Restrictions Weight Bearing Restrictions: No       Mobility Bed Mobility Overal bed mobility: Needs Assistance Bed Mobility: Supine to Sit     Supine to sit: Min assist, HOB elevated     General bed mobility comments: increased time and effort, min A to elevate  trunk    Transfers Overall transfer level: Needs assistance   Transfers: Sit to/from Stand, Bed to chair/wheelchair/BSC Sit to Stand: Max assist Stand pivot transfers: Max assist   Step pivot transfers: Max assist           Balance Overall balance assessment: Needs assistance Sitting-balance support: No upper extremity supported, Feet supported Sitting balance-Leahy Scale: Fair     Standing balance support: During functional activity, Bilateral upper extremity supported Standing balance-Leahy Scale: Poor                             ADL either performed or assessed with clinical judgement   ADL Overall ADL's : Needs assistance/impaired     Grooming: Wash/dry hands;Wash/dry face;Min guard;Sitting           Upper Body Dressing : Min guard;Sitting       Toilet Transfer: Maximal assistance;Moderate assistance;Stand-pivot;Cueing for safety;Cueing for sequencing Toilet Transfer Details (indicate cue type and reason): simulated to chair Toileting- Clothing Manipulation and Hygiene: Maximal assistance;Sitting/lateral lean       Functional mobility during ADLs: Maximal assistance;Moderate assistance;Cueing for safety;Cueing for sequencing      Extremity/Trunk Assessment Upper Extremity Assessment Upper Extremity Assessment: Generalized weakness   Lower Extremity Assessment Lower Extremity Assessment: Defer to PT evaluation   Cervical / Trunk Assessment Cervical / Trunk Assessment: Kyphotic    Vision Baseline Vision/History: 1 Wears glasses Ability to See in Adequate Light: 0 Adequate Patient Visual Report: No change from baseline     Perception     Praxis      Cognition Arousal/Alertness: Awake/alert  Behavior During Therapy: WFL for tasks assessed/performed Overall Cognitive Status: Impaired/Different from baseline Area of Impairment: Safety/judgement                         Safety/Judgement: Decreased awareness of deficits,  Decreased awareness of safety   Problem Solving: Requires verbal cues, Requires tactile cues          Exercises      Shoulder Instructions       General Comments      Pertinent Vitals/ Pain       Pain Assessment Pain Assessment: Faces Faces Pain Scale: Hurts a little bit Pain Location: R hand/elbow Pain Descriptors / Indicators: Tender Pain Intervention(s): Monitored during session, Repositioned  Home Living                                          Prior Functioning/Environment              Frequency  Min 2X/week        Progress Toward Goals  OT Goals(current goals can now be found in the care plan section)  Progress towards OT goals: Progressing toward goals     Plan Discharge plan remains appropriate;Frequency remains appropriate    Co-evaluation                 AM-PAC OT "6 Clicks" Daily Activity     Outcome Measure   Help from another person eating meals?: None Help from another person taking care of personal grooming?: A Little Help from another person toileting, which includes using toliet, bedpan, or urinal?: A Lot Help from another person bathing (including washing, rinsing, drying)?: A Lot Help from another person to put on and taking off regular upper body clothing?: A Little Help from another person to put on and taking off regular lower body clothing?: Total 6 Click Score: 15    End of Session Equipment Utilized During Treatment: Gait belt  OT Visit Diagnosis: Unsteadiness on feet (R26.81);Other abnormalities of gait and mobility (R26.89);Muscle weakness (generalized) (M62.81);Pain;Other symptoms and signs involving cognitive function Pain - Right/Left: Right Pain - part of body: Arm;Hand   Activity Tolerance Patient tolerated treatment well   Patient Left with call bell/phone within reach;in chair;with chair alarm set;with family/visitor present   Nurse Communication          Time: 4163-8453 OT Time  Calculation (min): 18 min  Charges: OT General Charges $OT Visit: 1 Visit OT Treatments $Self Care/Home Management : 8-22 mins    Britt Bottom 03/08/2021, 1:43 PM

## 2021-03-09 MED ORDER — FUROSEMIDE 10 MG/ML IJ SOLN
40.0000 mg | Freq: Once | INTRAMUSCULAR | Status: AC
  Administered 2021-03-09: 40 mg via INTRAVENOUS
  Filled 2021-03-09: qty 4

## 2021-03-09 NOTE — Progress Notes (Signed)
PROGRESS NOTE    Angel Cooper  FHQ:[id number] DOB: 05-Dec-1930 DOA: 03/01/2021 PCP: [name], MD   Chief Complaint  Patient presents with   Abdominal Pain    Brief Narrative:    88 year old male with CKD 3B, paroxysmal A. fib, history of renal resection due to [place], HTN, HLD comes into the hospital with abdominal pain for the past 3 days.  Apparently he was diagnosed with COVID as an outpatient about 2 weeks ago, was prescribed medication but never took them.  He saw his PCP a few days ago, had some respiratory symptoms and a chest x-ray showed pneumonia.  He then developed abdominal discomfort and came to the hospital.  He was found to have a small bowel obstruction, A. fib with RVR as well as left lobar pneumonia.  General surgery consulted.  He improved with conservative management and his NG tube was discontinued 2/6.  PT recommends SNF - as of 2/7, patient with progressive dyspnea, increased work of breathing, he did require NRB, transition during the day to 15 L high flow nasal cannula, work-up significant for volume overload, with large left> right pleural effusion.  Assessment & Plan:   Principal Problem:   Partial small bowel obstruction (HCC) Active Problems:   Acute respiratory failure with hypoxia (HCC)   Atrial fibrillation with RVR (HCC)   CAD (coronary artery disease) of artery bypass graft   Chronic kidney disease, stage 3b (HCC)   Leukocytosis   Essential hypertension   Anemia of chronic disease   Personal history of other malignant neoplasm of kidney   CAP (community acquired pneumonia)   History of COVID-19   Right shoulder pain due to gout   Protein-calorie malnutrition, severe   Acute on chronic combined systolic and diastolic CHF (congestive heart failure) (HCC)   Pleural effusion  Acute hypoxic respiratory failure -2/7 patient developed significant dyspnea, hypoxia with oxygen requirement up to 15 L high flow nasal cannula. -This is exacerbated by  volume overload and pleural effusion, with underlying  atelectasis and severe deconditioning -Improved after thoracentesis 2/8 -Continue to taper oxygen as tolerated, he is tolerating 2 L high flow nasal cannula today . -Continue with diuresis as needed, will give an extra 40 mg of IV Lasix today.    Pleural effusion -CT chest this morning significant for bilateral pleural effusion,. -Input greatly appreciated, status post thoracentesis with 500 cc pleural fluid out of right pleural space  -Was encouraged to use incentive spirometer, flutter valve .   Partial small bowel obstruction ([place])- (present on admission) - General surgery consulted and followed patient while hospitalized.  He was treated with conservative management with NG tube, IV fluids, n.p.o.  He improved, NG tube was discontinued 2/6 and his diet is slowly advanced.  He is doing better this morning -Tolerating soft diet, no nausea, no vomiting  Atrial fibrillation with RVR (HCC)- (present on admission) - Patient was started on Cardizem infusion while strict n.p.o.,, once his diet was advanced 2/6 he was placed on oral Cardizem 30 every 6.  He is off IV infusion, heart rate much better controlled.  Will consolidate and switch to 60 mg Q2 and potentially on discharge could go to 120 every 24 extended release.  He was on heparin infusion while n.p.o. and converted to Eliquis.  He was not on anticoagulation prior to admission.  I discussed with patient's son over the phone, they are not sure why he is not on anticoagulation but believe he may have not "felt  good" in the past but they do not remember exactly.  He has not had any falls and is otherwise active, somewhat limited by his knees.  Start Eliquis   CAP (community acquired pneumonia) -Started on antibiotics with ceftriaxone and azithromycin, treat for 5 days   AKI on Chronic kidney disease, stage 3b ([place])- (present on admission) - Baseline creatinine around 1.5.  Currently at  baseline.   -Continued up to 2.1 as he received IV Lasix overnight .   Essential hypertension- (present on admission) - Currently on furosemide, diltiazem.  Stable   Anemia of chronic disease- (present on admission) -Hemoglobin stable, no bleeding   Acute on Chronic combined systolic and diastolic heart failure ([place]) - 2D echo done this hospitalization shows an EF of 35-40%.  Patient has a history of coronary artery disease, and CABG in 1999.  His most recent 2D echo in the system is in 2015 and EF at that time was 45 to 50% with diffuse hypokinesis.   -Continue with diuresis as tolerated, I have started on low-dose midodrine to allow more room in blood pressure for diuresis.   -Continue with home dose Lasix of 80 mg oral daily . -As well his receiving IV Lasix on as needed daily basis pending blood pressure and renal function, now blood pressure has improved with midodrine and kidney function has improved we will give another 40 mg of IV Lasix this evening on top of the 80 mg p.o. earlier today .    Right shoulder pain due to gout - Started on 2/5, patient reports history of gouty attack at various sites of the body, this reminds him of one.  Received 125 Solu-Medrol 2/6, started on colchicine, significantly better today, he can move his arm now.  Switch to prednisone do a short 4-day course   History of COVID-19 -Son at bedside, reports father tested positive for 1 /13 / 2023, he was prescribed oral antiviral by PCP but he did not take.  . -Patient is 21 days currently from his initial infection, no need for COVID isolation, isolation has been discontinued . will discontinue.  .  Personal history of other malignant neoplasm of kidney - Noted   Leukocytosis - Due to pneumonia as well as steroid use   CAD (coronary artery disease) of artery bypass graft- (present on admission) - No chest pain, history of CABG in 1999  DVT prophylaxis: Eliquis>> lovenox Code Status: Full Family  Communication: D/W son at bedside DAILY, family friend at bedside today.  Disposition:   Status is: Inpatient            Consultants:  PCCM   Subjective:  Patient reports no fever, no chills, no chest pain, cough has improved, dyspnea has improved as well.  Objective: Vitals:   03/09/21 0000 03/09/21 0400 03/09/21 0800 03/09/21 1208  BP: 124/87 (!) 131/56 117/66 112/60  Pulse: 92 94 95 87  Resp: 20 20 20 17   Temp: 97.9 F (36.6 C) 98.6 F (37 C) (!) 97.3 F (36.3 C) 97.7 F (36.5 C)  TempSrc: Oral Oral Oral Oral  SpO2: 100% 95% 100% 97%  Weight:      Height:        Intake/Output Summary (Last 24 hours) at 03/09/2021 1313 Last data filed at 03/09/2021 0530 Gross per 24 hour  Intake --  Output 1851 ml  Net -1851 ml   Filed Weights   03/05/21 0433 03/06/21 0500 03/08/21 0500  Weight: 63 kg 63.7 kg 57.3 kg  Examination:   Awake Alert, Oriented X 3, frail, thin appearing and deconditioned Symmetrical Chest wall movement, diminished air entry at the bases RRR,No Gallops,Rubs or new Murmurs, No Parasternal Heave +ve B.Sounds, Abd Soft, No tenderness, No rebound - guarding or rigidity. No Cyanosis, Clubbing or edema, No new Rash or bruise        Data Reviewed: I have personally reviewed following labs and imaging studies  CBC: Recent Labs  Lab 03/04/21 0208 03/05/21 0049 03/06/21 0057 03/07/21 0407 03/08/21 0308  WBC 17.8* 23.1* 25.2* 18.1* 18.8*  HGB 8.6* 9.5* 9.2* 8.4* 8.7*  HCT 27.3* 29.5* 29.1* 25.3* 26.2*  MCV 90.4 90.5 89.3 88.2 87.9  PLT 486* 492* 518* 370 878    Basic Metabolic Panel: Recent Labs  Lab 03/04/21 0208 03/05/21 0049 03/06/21 0057 03/07/21 0407 03/08/21 0308  NA 137 136 134* 136 137  K 3.5 4.5 3.9 3.4* 3.5  CL 104 102 100 104 102  CO2 22 21* 19* 23 24  GLUCOSE 71 218* 228* 122* 136*  BUN 26* 40* 58* 59* 53*  CREATININE 1.58* 1.69* 2.14* 1.74* 1.61*  CALCIUM 8.0* 8.4* 8.2* 8.0* 8.0*    GFR: Estimated  Creatinine Clearance: 25.2 mL/min (A) (by C-G formula based on SCr of 1.61 mg/dL (H)).  Liver Function Tests: Recent Labs  Lab 03/06/21 0057  AST 201*  ALT 52*  ALKPHOS 79  BILITOT 0.2*  PROT 6.0*  ALBUMIN 2.6*    CBG: No results for input(s): GLUCAP in the last 168 hours.   Recent Results (from the past 240 hour(s))  Blood culture (routine x 2)     Status: None   Collection Time: 03/01/21  7:00 PM   Specimen: BLOOD RIGHT ARM  Result Value Ref Range Status   Specimen Description BLOOD RIGHT ARM  Final   Special Requests   Final    BOTTLES DRAWN AEROBIC AND ANAEROBIC Blood Culture results may not be optimal due to an excessive volume of blood received in culture bottles   Culture   Final    NO GROWTH 5 DAYS Performed at [facility], [place] [address], [place], [place] [address]    Report Status 03/06/2021 FINAL  Final  Blood culture (routine x 2)     Status: None   Collection Time: 03/01/21  7:05 PM   Specimen: BLOOD LEFT ARM  Result Value Ref Range Status   Specimen Description BLOOD LEFT ARM  Final   Special Requests   Final    BOTTLES DRAWN AEROBIC AND ANAEROBIC Blood Culture adequate volume   Culture   Final    NO GROWTH 5 DAYS Performed at [facility], [place] [address], [place],  [address]    Report Status 03/06/2021 FINAL  Final  Respiratory (~20 pathogens) panel by PCR     Status: None   Collection Time: 03/01/21  8:11 PM   Specimen: Nasopharyngeal Swab; Respiratory  Result Value Ref Range Status   Adenovirus NOT DETECTED NOT DETECTED Final   Coronavirus 229E NOT DETECTED NOT DETECTED Final    Comment: (NOTE) The Coronavirus on the Respiratory Panel, DOES NOT test for the novel  Coronavirus (2019 nCoV)    Coronavirus HKU1 NOT DETECTED NOT DETECTED Final   Coronavirus NL63 NOT DETECTED NOT DETECTED Final   Coronavirus OC43 NOT DETECTED NOT DETECTED Final   Metapneumovirus NOT DETECTED NOT DETECTED Final   Rhinovirus / Enterovirus NOT  DETECTED NOT DETECTED Final   Influenza A NOT DETECTED NOT DETECTED  Final   Influenza B NOT DETECTED NOT DETECTED Final   Parainfluenza Virus 1 NOT DETECTED NOT DETECTED Final   Parainfluenza Virus 2 NOT DETECTED NOT DETECTED Final   Parainfluenza Virus 3 NOT DETECTED NOT DETECTED Final   Parainfluenza Virus 4 NOT DETECTED NOT DETECTED Final   Respiratory Syncytial Virus NOT DETECTED NOT DETECTED Final   Bordetella pertussis NOT DETECTED NOT DETECTED Final   Bordetella Parapertussis NOT DETECTED NOT DETECTED Final   Chlamydophila pneumoniae NOT DETECTED NOT DETECTED Final   Mycoplasma pneumoniae NOT DETECTED NOT DETECTED Final    Comment: Performed at [facility], [place] [address] [place], [place] [address]  Resp Panel by RT-PCR (Flu A&B, Covid) Nasopharyngeal Swab     Status: Abnormal   Collection Time: 03/02/21  3:12 AM   Specimen: Nasopharyngeal Swab; Nasopharyngeal(NP) swabs in vial transport medium  Result Value Ref Range Status   SARS Coronavirus 2 by RT PCR POSITIVE (A) NEGATIVE Final    Comment: (NOTE) SARS-CoV-2 target nucleic acids are DETECTED.  The SARS-CoV-2 RNA is generally detectable in upper respiratory specimens during the acute phase of infection. Positive results are indicative of the presence of the identified virus, but do not rule out bacterial infection or co-infection with other pathogens not detected by the test. Clinical correlation with patient history and other diagnostic information is necessary to determine patient infection status. The expected result is Negative.  Fact Sheet for Patients: EntrepreneurPulse.com.au  Fact Sheet for Healthcare Providers: IncredibleEmployment.be  This test is not yet approved or cleared by the [place] FDA and  has been authorized for detection and/or diagnosis of SARS-CoV-2 by FDA under an Emergency Use Authorization (EUA).  This EUA will remain in effect (meaning this  test can be used) for the duration of  the COVID-19 declaration under Section 564(b)(1) of the A ct, 21 U.S.C. section 360bbb-3(b)(1), unless the authorization is terminated or revoked sooner.     Influenza A by PCR NEGATIVE NEGATIVE Final   Influenza B by PCR NEGATIVE NEGATIVE Final    Comment: (NOTE) The Xpert Xpress SARS-CoV-2/FLU/RSV plus assay is intended as an aid in the diagnosis of influenza from Nasopharyngeal swab specimens and should not be used as a sole basis for treatment. Nasal washings and aspirates are unacceptable for Xpert Xpress SARS-CoV-2/FLU/RSV testing.  Fact Sheet for Patients: EntrepreneurPulse.com.au  Fact Sheet for Healthcare Providers: IncredibleEmployment.be  This test is not yet approved or cleared by the [place] FDA and has been authorized for detection and/or diagnosis of SARS-CoV-2 by FDA under an Emergency Use Authorization (EUA). This EUA will remain in effect (meaning this test can be used) for the duration of the COVID-19 declaration under Section 564(b)(1) of the Act, 21 U.S.C. section 360bbb-3(b)(1), unless the authorization is terminated or revoked.  Performed at [facility], [place] [address], [place], [place] [address]   Body fluid culture w Gram Stain     Status: None (Preliminary result)   Collection Time: 03/06/21  3:35 PM   Specimen: Pleural Fluid  Result Value Ref Range Status   Specimen Description FLUID PLEURAL  Final   Special Requests NONE  Final   Gram Stain   Final    RARE WBC PRESENT,BOTH PMN AND MONONUCLEAR NO ORGANISMS SEEN    Culture   Final    NO GROWTH 3 DAYS Performed at [facility], [place] [address], [place],  [address]    Report Status PENDING  Incomplete  Radiology Studies: No results found.      Scheduled Meds:  allopurinol  50 mg Oral Daily   apixaban  2.5 mg Oral BID   diltiazem  60 mg Oral Q12H   docusate sodium  100 mg Oral  BID   feeding supplement  237 mL Oral TID BM   furosemide  40 mg Intravenous Once   furosemide  80 mg Oral Daily   mouth rinse  15 mL Mouth Rinse BID   midodrine  5 mg Oral BID WC   multivitamin  1 tablet Oral QHS   pantoprazole  40 mg Oral Q1200   polyethylene glycol  17 g Oral Daily   Continuous Infusions:     LOS: 8 days        Phillips Climes, MD [facility]   To contact the attending provider between 7A-7P or the covering provider during after hours 7P-7A, please log into the web site [email or web address] and access using universal [place] password for that web site. If you do not have the password, please call the hospital operator.  03/09/2021, 1:13 PM

## 2021-03-10 LAB — CBC
HCT: 33.9 % — ABNORMAL LOW (ref 39.0–52.0)
Hemoglobin: 10.6 g/dL — ABNORMAL LOW (ref 13.0–17.0)
MCH: 28.1 pg (ref 26.0–34.0)
MCHC: 31.3 g/dL (ref 30.0–36.0)
MCV: 89.9 fL (ref 80.0–100.0)
Platelets: 417 10*3/uL — ABNORMAL HIGH (ref 150–400)
RBC: 3.77 MIL/uL — ABNORMAL LOW (ref 4.22–5.81)
RDW: 17.5 % — ABNORMAL HIGH (ref 11.5–15.5)
WBC: 20.3 10*3/uL — ABNORMAL HIGH (ref 4.0–10.5)
nRBC: 0 % (ref 0.0–0.2)

## 2021-03-10 LAB — BODY FLUID CULTURE W GRAM STAIN: Culture: NO GROWTH

## 2021-03-10 LAB — BASIC METABOLIC PANEL
Anion gap: 14 (ref 5–15)
BUN: 37 mg/dL — ABNORMAL HIGH (ref 8–23)
CO2: 27 mmol/L (ref 22–32)
Calcium: 8.2 mg/dL — ABNORMAL LOW (ref 8.9–10.3)
Chloride: 96 mmol/L — ABNORMAL LOW (ref 98–111)
Creatinine, Ser: 1.32 mg/dL — ABNORMAL HIGH (ref 0.61–1.24)
GFR, Estimated: 52 mL/min — ABNORMAL LOW (ref >60)
Glucose, Bld: 121 mg/dL — ABNORMAL HIGH (ref 70–99)
Potassium: 3.6 mmol/L (ref 3.5–5.1)
Sodium: 137 mmol/L (ref 135–145)

## 2021-03-10 MED ORDER — FUROSEMIDE 10 MG/ML IJ SOLN
40.0000 mg | Freq: Once | INTRAMUSCULAR | Status: AC
Start: 2021-03-10 — End: 2021-03-10
  Administered 2021-03-10: 40 mg via INTRAVENOUS
  Filled 2021-03-10: qty 4

## 2021-03-10 MED ORDER — FUROSEMIDE 40 MG PO TABS
80.0000 mg | ORAL_TABLET | Freq: Every day | ORAL | Status: DC
  Administered 2021-03-11: 80 mg via ORAL
  Filled 2021-03-10: qty 2

## 2021-03-10 NOTE — Progress Notes (Signed)
PROGRESS NOTE    Angel Cooper  UKG:[id number] DOB: 1930/05/10 DOA: 03/01/2021 PCP: [name], MD   Chief Complaint  Patient presents with   Abdominal Pain    Brief Narrative:    88 year old male with CKD 3B, paroxysmal A. fib, history of renal resection due to [place], HTN, HLD comes into the hospital with abdominal pain for the past 3 days.  Apparently he was diagnosed with COVID as an outpatient about 2 weeks ago, was prescribed medication but never took them.  He saw his PCP a few days ago, had some respiratory symptoms and a chest x-ray showed pneumonia.  He then developed abdominal discomfort and came to the hospital.  He was found to have a small bowel obstruction, A. fib with RVR as well as left lobar pneumonia.  General surgery consulted.  He improved with conservative management and his NG tube was discontinued 2/6.  PT recommends SNF - as of 2/7, patient with progressive dyspnea, increased work of breathing, he did require NRB, transition during the day to 15 L high flow nasal cannula, work-up significant for volume overload, with large left> right pleural effusion.  Had thoracentesis done by PCCM, respiratory status continues to improve with diuresis.  Assessment & Plan:   Principal Problem:   Partial small bowel obstruction (HCC) Active Problems:   Acute respiratory failure with hypoxia (HCC)   Atrial fibrillation with RVR (HCC)   CAD (coronary artery disease) of artery bypass graft   Chronic kidney disease, stage 3b (HCC)   Leukocytosis   Essential hypertension   Anemia of chronic disease   Personal history of other malignant neoplasm of kidney   CAP (community acquired pneumonia)   History of COVID-19   Right shoulder pain due to gout   Protein-calorie malnutrition, severe   Acute on chronic combined systolic and diastolic CHF (congestive heart failure) (HCC)   Pleural effusion  Acute hypoxic respiratory failure -2/7 patient developed significant dyspnea,  hypoxia with oxygen requirement up to 15 L high flow nasal cannula. -This is exacerbated by volume overload and pleural effusion, with underlying  atelectasis and severe deconditioning -Improved after thoracentesis 2/8 -Continue to taper oxygen as tolerated, he is on 3 L nasal cannula today, he was encouraged to keep using incentive spirometry and flutter valve -Continues to improve with diuresis, will give 40 mg of IV Lasix today.  Pleural effusion -CT chest this morning significant for bilateral pleural effusion,. -Input greatly appreciated, status post thoracentesis with 500 cc pleural fluid out of right pleural space  -Was encouraged to use incentive spirometer, flutter valve .   Partial small bowel obstruction ([place])- (present on admission) - General surgery consulted and followed patient while hospitalized.  He was treated with conservative management with NG tube, IV fluids, n.p.o.  He improved, NG tube was discontinued 2/6 and his diet is slowly advanced.  He is doing better this morning -Tolerating soft diet, no nausea, no vomiting  Atrial fibrillation with RVR (HCC)- (present on admission) - Patient was started on Cardizem infusion while strict n.p.o.,, once his diet was advanced 2/6 he was placed on oral Cardizem 30 every 6.  He is off IV infusion, heart rate much better controlled.  Will consolidate and switch to 60 mg Q2 and potentially on discharge could go to 120 every 24 extended release.  He was on heparin infusion while n.p.o. and converted to Eliquis.  He was not on anticoagulation prior to admission.  I discussed with patient's son over the phone,  they are not sure why he is not on anticoagulation but believe he may have not "felt good" in the past but they do not remember exactly.  He has not had any falls and is otherwise active, somewhat limited by his knees.  Start Eliquis   CAP (community acquired pneumonia) -Started on antibiotics with ceftriaxone and azithromycin, treat  for 5 days   AKI on Chronic kidney disease, stage 3b ([place])- (present on admission) - Baseline creatinine around 1.5.  Currently at baseline.   -Creatinine has peaked at 2.1, most likely due to cardiorenal syndrome, now improving with IV diuresis.   Essential hypertension- (present on admission) - Currently on furosemide, diltiazem.  Stable   Anemia of chronic disease- (present on admission) -Hemoglobin stable, no bleeding   Acute on Chronic combined systolic and diastolic heart failure ([place]) - 2D echo done this hospitalization shows an EF of 35-40%.  Patient has a history of coronary artery disease, and CABG in 1999.  His most recent 2D echo in the system is in 2015 and EF at that time was 45 to 50% with diffuse hypokinesis.   -Continue with diuresis as tolerated, I have started on low-dose midodrine to allow more room in blood pressure for diuresis.   -Continue with home dose Lasix of 80 mg oral daily . -As well his receiving IV Lasix on as needed daily basis pending blood pressure and renal function, now blood pressure has improved with midodrine and kidney function has improved as well, creatinine is 1.3 today, blood pressure has improved, will give 40 mg of IV Lasix today.      Right shoulder pain due to gout - Started on 2/5, patient reports history of gouty attack at various sites of the body, this reminds him of one.  Received 125 Solu-Medrol 2/6, started on colchicine, significantly better today, he can move his arm now.  Switch to prednisone do a short 4-day course   History of COVID-19 -Son at bedside, reports father tested positive for 1 /13 / 2023, he was prescribed oral antiviral by PCP but he did not take.  . -Patient is 21 days currently from his initial infection, no need for COVID isolation, isolation has been discontinued . will discontinue.  .  Personal history of other malignant neoplasm of kidney - Noted   Leukocytosis - Due to pneumonia as well as steroid use    CAD (coronary artery disease) of artery bypass graft- (present on admission) - No chest pain, history of CABG in 1999  DVT prophylaxis: Eliquis Code Status: Full Family Communication: None at bedside today  Disposition:   Status is: Inpatient            Consultants:  PCCM   Subjective:  Patient reports good appetite, dyspnea and cough is improving, no fever, no chills, no chest pain.     Objective: Vitals:   03/10/21 0304 03/10/21 0500 03/10/21 0813 03/10/21 1200  BP: (!) 116/59  124/73 118/62  Pulse: 74  100 89  Resp: 20  20 20   Temp: 97.9 F (36.6 C)  (!) 97.4 F (36.3 C) 97.7 F (36.5 C)  TempSrc: Oral  Oral Oral  SpO2: 99%  95% 99%  Weight:  58 kg    Height:        Intake/Output Summary (Last 24 hours) at 03/10/2021 1516 Last data filed at 03/10/2021 8921 Gross per 24 hour  Intake --  Output 875 ml  Net -875 ml   Filed Weights   03/06/21  0500 03/08/21 0500 03/10/21 0500  Weight: 63.7 kg 57.3 kg 58 kg    Examination:   Awake Alert, Oriented X 3, frail, deconditioned Symmetrical Chest wall movement, diminished air entry at the bases RRR,No Gallops,Rubs or new Murmurs, No Parasternal Heave +ve B.Sounds, Abd Soft, No tenderness, No rebound - guarding or rigidity. No Cyanosis, Clubbing or edema, No new Rash or bruise         Data Reviewed: I have personally reviewed following labs and imaging studies  CBC: Recent Labs  Lab 03/05/21 0049 03/06/21 0057 03/07/21 0407 03/08/21 0308 03/10/21 0902  WBC 23.1* 25.2* 18.1* 18.8* 20.3*  HGB 9.5* 9.2* 8.4* 8.7* 10.6*  HCT 29.5* 29.1* 25.3* 26.2* 33.9*  MCV 90.5 89.3 88.2 87.9 89.9  PLT 492* 518* 370 343 417*    Basic Metabolic Panel: Recent Labs  Lab 03/05/21 0049 03/06/21 0057 03/07/21 0407 03/08/21 0308 03/10/21 0902  NA 136 134* 136 137 137  K 4.5 3.9 3.4* 3.5 3.6  CL 102 100 104 102 96*  CO2 21* 19* 23 24 27   GLUCOSE 218* 228* 122* 136* 121*  BUN 40* 58* 59* 53* 37*   CREATININE 1.69* 2.14* 1.74* 1.61* 1.32*  CALCIUM 8.4* 8.2* 8.0* 8.0* 8.2*    GFR: Estimated Creatinine Clearance: 31.1 mL/min (A) (by C-G formula based on SCr of 1.32 mg/dL (H)).  Liver Function Tests: Recent Labs  Lab 03/06/21 0057  AST 201*  ALT 52*  ALKPHOS 79  BILITOT 0.2*  PROT 6.0*  ALBUMIN 2.6*    CBG: No results for input(s): GLUCAP in the last 168 hours.   Recent Results (from the past 240 hour(s))  Blood culture (routine x 2)     Status: None   Collection Time: 03/01/21  7:00 PM   Specimen: BLOOD RIGHT ARM  Result Value Ref Range Status   Specimen Description BLOOD RIGHT ARM  Final   Special Requests   Final    BOTTLES DRAWN AEROBIC AND ANAEROBIC Blood Culture results may not be optimal due to an excessive volume of blood received in culture bottles   Culture   Final    NO GROWTH 5 DAYS Performed at [facility], [place] [address] [place], [place] [address]    Report Status 03/06/2021 FINAL  Final  Blood culture (routine x 2)     Status: None   Collection Time: 03/01/21  7:05 PM   Specimen: BLOOD LEFT ARM  Result Value Ref Range Status   Specimen Description BLOOD LEFT ARM  Final   Special Requests   Final    BOTTLES DRAWN AEROBIC AND ANAEROBIC Blood Culture adequate volume   Culture   Final    NO GROWTH 5 DAYS Performed at [facility], [place] [address] [place], [place] [address]    Report Status 03/06/2021 FINAL  Final  Respiratory (~20 pathogens) panel by PCR     Status: None   Collection Time: 03/01/21  8:11 PM   Specimen: Nasopharyngeal Swab; Respiratory  Result Value Ref Range Status   Adenovirus NOT DETECTED NOT DETECTED Final   Coronavirus 229E NOT DETECTED NOT DETECTED Final    Comment: (NOTE) The Coronavirus on the Respiratory Panel, DOES NOT test for the novel  Coronavirus (2019 nCoV)    Coronavirus HKU1 NOT DETECTED NOT DETECTED Final   Coronavirus NL63 NOT DETECTED NOT DETECTED Final   Coronavirus OC43 NOT DETECTED NOT  DETECTED Final   Metapneumovirus NOT DETECTED NOT DETECTED Final   Rhinovirus /  Enterovirus NOT DETECTED NOT DETECTED Final   Influenza A NOT DETECTED NOT DETECTED Final   Influenza B NOT DETECTED NOT DETECTED Final   Parainfluenza Virus 1 NOT DETECTED NOT DETECTED Final   Parainfluenza Virus 2 NOT DETECTED NOT DETECTED Final   Parainfluenza Virus 3 NOT DETECTED NOT DETECTED Final   Parainfluenza Virus 4 NOT DETECTED NOT DETECTED Final   Respiratory Syncytial Virus NOT DETECTED NOT DETECTED Final   Bordetella pertussis NOT DETECTED NOT DETECTED Final   Bordetella Parapertussis NOT DETECTED NOT DETECTED Final   Chlamydophila pneumoniae NOT DETECTED NOT DETECTED Final   Mycoplasma pneumoniae NOT DETECTED NOT DETECTED Final    Comment: Performed at [facility], [place] [address] [place], [place] [address]  Resp Panel by RT-PCR (Flu A&B, Covid) Nasopharyngeal Swab     Status: Abnormal   Collection Time: 03/02/21  3:12 AM   Specimen: Nasopharyngeal Swab; Nasopharyngeal(NP) swabs in vial transport medium  Result Value Ref Range Status   SARS Coronavirus 2 by RT PCR POSITIVE (A) NEGATIVE Final    Comment: (NOTE) SARS-CoV-2 target nucleic acids are DETECTED.  The SARS-CoV-2 RNA is generally detectable in upper respiratory specimens during the acute phase of infection. Positive results are indicative of the presence of the identified virus, but do not rule out bacterial infection or co-infection with other pathogens not detected by the test. Clinical correlation with patient history and other diagnostic information is necessary to determine patient infection status. The expected result is Negative.  Fact Sheet for Patients: EntrepreneurPulse.com.au  Fact Sheet for Healthcare Providers: IncredibleEmployment.be  This test is not yet approved or cleared by the [place] FDA and  has been authorized for detection and/or diagnosis of SARS-CoV-2  by FDA under an Emergency Use Authorization (EUA).  This EUA will remain in effect (meaning this test can be used) for the duration of  the COVID-19 declaration under Section 564(b)(1) of the A ct, 21 U.S.C. section 360bbb-3(b)(1), unless the authorization is terminated or revoked sooner.     Influenza A by PCR NEGATIVE NEGATIVE Final   Influenza B by PCR NEGATIVE NEGATIVE Final    Comment: (NOTE) The Xpert Xpress SARS-CoV-2/FLU/RSV plus assay is intended as an aid in the diagnosis of influenza from Nasopharyngeal swab specimens and should not be used as a sole basis for treatment. Nasal washings and aspirates are unacceptable for Xpert Xpress SARS-CoV-2/FLU/RSV testing.  Fact Sheet for Patients: EntrepreneurPulse.com.au  Fact Sheet for Healthcare Providers: IncredibleEmployment.be  This test is not yet approved or cleared by the [place] FDA and has been authorized for detection and/or diagnosis of SARS-CoV-2 by FDA under an Emergency Use Authorization (EUA). This EUA will remain in effect (meaning this test can be used) for the duration of the COVID-19 declaration under Section 564(b)(1) of the Act, 21 U.S.C. section 360bbb-3(b)(1), unless the authorization is terminated or revoked.  Performed at [facility], [place] [address], [place], [place] [address]   Body fluid culture w Gram Stain     Status: None   Collection Time: 03/06/21  3:35 PM   Specimen: Pleural Fluid  Result Value Ref Range Status   Specimen Description FLUID PLEURAL  Final   Special Requests NONE  Final   Gram Stain   Final    RARE WBC PRESENT,BOTH PMN AND MONONUCLEAR NO ORGANISMS SEEN    Culture   Final    NO GROWTH 3 DAYS Performed at [facility], [place] [address], [place], [place] [address]  Report Status 03/10/2021 FINAL  Final         Radiology Studies: No results found.      Scheduled Meds:  allopurinol  50 mg Oral Daily    apixaban  2.5 mg Oral BID   diltiazem  60 mg Oral Q12H   docusate sodium  100 mg Oral BID   feeding supplement  237 mL Oral TID BM   [START ON 03/11/2021] furosemide  80 mg Oral Daily   mouth rinse  15 mL Mouth Rinse BID   midodrine  5 mg Oral BID WC   multivitamin  1 tablet Oral QHS   pantoprazole  40 mg Oral Q1200   polyethylene glycol  17 g Oral Daily   Continuous Infusions:     LOS: 9 days        Phillips Climes, MD [facility]   To contact the attending provider between 7A-7P or the covering provider during after hours 7P-7A, please log into the web site [email or web address] and access using universal [place] password for that web site. If you do not have the password, please call the hospital operator.  03/10/2021, 3:16 PM

## 2021-03-10 NOTE — Plan of Care (Signed)
°  Problem: Education: Goal: Knowledge of General Education information will improve Description: Including pain rating scale, medication(s)/side effects and non-pharmacologic comfort measures Outcome: Progressing   Problem: Health Behavior/Discharge Planning: Goal: Ability to manage health-related needs will improve Outcome: Progressing   Problem: Clinical Measurements: Goal: Ability to maintain clinical measurements within normal limits will improve Outcome: Progressing Goal: Will remain free from infection Outcome: Progressing Goal: Diagnostic test results will improve Outcome: Progressing Goal: Respiratory complications will improve Outcome: Progressing Goal: Cardiovascular complication will be avoided Outcome: Progressing   Problem: Activity: Goal: Risk for activity intolerance will decrease Outcome: Progressing   Problem: Coping: Goal: Level of anxiety will decrease Outcome: Progressing   Problem: Elimination: Goal: Will not experience complications related to bowel motility Outcome: Progressing Goal: Will not experience complications related to urinary retention Outcome: Progressing   Problem: Pain Managment: Goal: General experience of comfort will improve Outcome: Progressing   Problem: Safety: Goal: Ability to remain free from injury will improve Outcome: Progressing   Problem: Nutrition: Goal: Adequate nutrition will be maintained Outcome: Not Progressing

## 2021-03-11 DIAGNOSIS — L89610 Pressure ulcer of right heel, unstageable: Secondary | ICD-10-CM | POA: Diagnosis not present

## 2021-03-11 DIAGNOSIS — I480 Paroxysmal atrial fibrillation: Secondary | ICD-10-CM | POA: Diagnosis not present

## 2021-03-11 DIAGNOSIS — E785 Hyperlipidemia, unspecified: Secondary | ICD-10-CM | POA: Diagnosis not present

## 2021-03-11 DIAGNOSIS — E559 Vitamin D deficiency, unspecified: Secondary | ICD-10-CM | POA: Diagnosis not present

## 2021-03-11 DIAGNOSIS — L89619 Pressure ulcer of right heel, unspecified stage: Secondary | ICD-10-CM | POA: Diagnosis not present

## 2021-03-11 DIAGNOSIS — R6884 Jaw pain: Secondary | ICD-10-CM | POA: Diagnosis not present

## 2021-03-11 DIAGNOSIS — R7309 Other abnormal glucose: Secondary | ICD-10-CM | POA: Diagnosis not present

## 2021-03-11 DIAGNOSIS — K5669 Other partial intestinal obstruction: Secondary | ICD-10-CM | POA: Diagnosis not present

## 2021-03-11 DIAGNOSIS — N1832 Chronic kidney disease, stage 3b: Secondary | ICD-10-CM | POA: Diagnosis not present

## 2021-03-11 DIAGNOSIS — I10 Essential (primary) hypertension: Secondary | ICD-10-CM | POA: Diagnosis not present

## 2021-03-11 DIAGNOSIS — K5900 Constipation, unspecified: Secondary | ICD-10-CM | POA: Diagnosis not present

## 2021-03-11 DIAGNOSIS — E43 Unspecified severe protein-calorie malnutrition: Secondary | ICD-10-CM | POA: Diagnosis not present

## 2021-03-11 DIAGNOSIS — D72829 Elevated white blood cell count, unspecified: Secondary | ICD-10-CM | POA: Diagnosis not present

## 2021-03-11 DIAGNOSIS — M1990 Unspecified osteoarthritis, unspecified site: Secondary | ICD-10-CM | POA: Diagnosis not present

## 2021-03-11 DIAGNOSIS — K56609 Unspecified intestinal obstruction, unspecified as to partial versus complete obstruction: Secondary | ICD-10-CM | POA: Diagnosis not present

## 2021-03-11 DIAGNOSIS — D638 Anemia in other chronic diseases classified elsewhere: Secondary | ICD-10-CM | POA: Diagnosis not present

## 2021-03-11 DIAGNOSIS — D649 Anemia, unspecified: Secondary | ICD-10-CM | POA: Diagnosis not present

## 2021-03-11 DIAGNOSIS — I509 Heart failure, unspecified: Secondary | ICD-10-CM | POA: Diagnosis not present

## 2021-03-11 DIAGNOSIS — J9601 Acute respiratory failure with hypoxia: Secondary | ICD-10-CM | POA: Diagnosis not present

## 2021-03-11 DIAGNOSIS — I959 Hypotension, unspecified: Secondary | ICD-10-CM | POA: Diagnosis not present

## 2021-03-11 DIAGNOSIS — I25810 Atherosclerosis of coronary artery bypass graft(s) without angina pectoris: Secondary | ICD-10-CM | POA: Diagnosis not present

## 2021-03-11 DIAGNOSIS — G479 Sleep disorder, unspecified: Secondary | ICD-10-CM | POA: Diagnosis not present

## 2021-03-11 DIAGNOSIS — K56600 Partial intestinal obstruction, unspecified as to cause: Secondary | ICD-10-CM | POA: Diagnosis not present

## 2021-03-11 DIAGNOSIS — M109 Gout, unspecified: Secondary | ICD-10-CM | POA: Diagnosis not present

## 2021-03-11 DIAGNOSIS — I4891 Unspecified atrial fibrillation: Secondary | ICD-10-CM | POA: Diagnosis not present

## 2021-03-11 DIAGNOSIS — K219 Gastro-esophageal reflux disease without esophagitis: Secondary | ICD-10-CM | POA: Diagnosis not present

## 2021-03-11 DIAGNOSIS — J189 Pneumonia, unspecified organism: Secondary | ICD-10-CM | POA: Diagnosis not present

## 2021-03-11 DIAGNOSIS — Z8616 Personal history of COVID-19: Secondary | ICD-10-CM | POA: Diagnosis not present

## 2021-03-11 DIAGNOSIS — R918 Other nonspecific abnormal finding of lung field: Secondary | ICD-10-CM | POA: Diagnosis not present

## 2021-03-11 DIAGNOSIS — Z7401 Bed confinement status: Secondary | ICD-10-CM | POA: Diagnosis not present

## 2021-03-11 DIAGNOSIS — I5043 Acute on chronic combined systolic (congestive) and diastolic (congestive) heart failure: Secondary | ICD-10-CM | POA: Diagnosis not present

## 2021-03-11 DIAGNOSIS — R140 Abdominal distension (gaseous): Secondary | ICD-10-CM | POA: Diagnosis not present

## 2021-03-11 DIAGNOSIS — I5040 Unspecified combined systolic (congestive) and diastolic (congestive) heart failure: Secondary | ICD-10-CM | POA: Diagnosis not present

## 2021-03-11 MED ORDER — PANTOPRAZOLE SODIUM 40 MG PO TBEC: 40.0000 mg | DELAYED_RELEASE_TABLET | Freq: Every day | ORAL | Status: AC

## 2021-03-11 MED ORDER — POLYETHYLENE GLYCOL 3350 17 G PO PACK: 17.0000 g | PACK | Freq: Every day | ORAL | 0 refills | Status: AC | PRN

## 2021-03-11 MED ORDER — ALBUTEROL SULFATE (2.5 MG/3ML) 0.083% IN NEBU: 2.5000 mg | INHALATION_SOLUTION | RESPIRATORY_TRACT | 12 refills | Status: DC | PRN

## 2021-03-11 MED ORDER — MIDODRINE HCL 5 MG PO TABS: 2.5000 mg | ORAL_TABLET | Freq: Two times a day (BID) | ORAL | Status: DC

## 2021-03-11 MED ORDER — RENA-VITE PO TABS: 1.0000 | ORAL_TABLET | Freq: Every day | ORAL | 0 refills | Status: AC

## 2021-03-11 MED ORDER — HYDROCODONE-ACETAMINOPHEN 5-325 MG PO TABS: 1.0000 | ORAL_TABLET | Freq: Four times a day (QID) | ORAL | 0 refills | Status: AC | PRN

## 2021-03-11 MED ORDER — FUROSEMIDE 40 MG PO TABS: 60.0000 mg | ORAL_TABLET | Freq: Two times a day (BID) | ORAL | Status: DC

## 2021-03-11 MED ORDER — ENSURE ENLIVE PO LIQD: 237.0000 mL | Freq: Three times a day (TID) | ORAL | 12 refills | Status: AC

## 2021-03-11 MED ORDER — DILTIAZEM HCL ER 60 MG PO CP12: 60.0000 mg | ORAL_CAPSULE | Freq: Two times a day (BID) | ORAL | 0 refills | Status: DC

## 2021-03-11 MED ORDER — ACETAMINOPHEN 500 MG PO TABS: 500.0000 mg | ORAL_TABLET | ORAL | 0 refills | Status: AC | PRN

## 2021-03-11 MED ORDER — APIXABAN 2.5 MG PO TABS: 2.5000 mg | ORAL_TABLET | Freq: Two times a day (BID) | ORAL | Status: DC

## 2021-03-11 MED ORDER — ALPRAZOLAM 0.25 MG PO TABS: 0.2500 mg | ORAL_TABLET | Freq: Every evening | ORAL | 0 refills | Status: AC | PRN

## 2021-03-11 NOTE — Progress Notes (Signed)
PT Cancellation Note  Patient Details Name: TURKI TAPANES MRN: [id number] DOB: 1930/12/03   Cancelled Treatment:    Reason Eval/Treat Not Completed: Patient awaiting PTAR transport to SNF. Will defer treatment.  Mabeline Caras, PT, DPT Acute Rehabilitation Services  Pager [phone] Office [place] 03/11/2021, 1:06 PM

## 2021-03-11 NOTE — TOC Progression Note (Signed)
Transition of Care [facility]) - Progression Note    Patient Details  Name: Angel Cooper MRN: [id number] Date of Birth: 04/18/1930  Transition of Care [facility]) CM/SW [place], LCSW Phone Number: 03/11/2021, 11:29 AM  Clinical Narrative:    [facility] has received insurance approval for patient. CSW updated patient's son and he will complete paperwork at 1pm. CSW will arrange PTAR for 1:30pm per [facility].   Expected Discharge Plan: [place] Barriers to Discharge: Continued Medical Work up, Ship broker  Expected Discharge Plan and Services Expected Discharge Plan: [place] In-house Referral: Clinical Social Work   Post Acute Care Choice: [place] Living arrangements for the past 2 months: Single Family Home Expected Discharge Date: 03/11/21                                     Social Determinants of Health (SDOH) Interventions    Readmission Risk Interventions No flowsheet data found.

## 2021-03-11 NOTE — TOC Transition Note (Addendum)
Transition of Care [facility]) - CM/SW Discharge Note   Patient Details  Name: Angel Cooper MRN: [id number] Date of Birth: [birth year]  Transition of Care [facility]) CM/SW Contact:  Benard Halsted, LCSW Phone Number: 03/11/2021, 12:01 PM   Clinical Narrative:    Patient will DC to: [facility] Anticipated DC date: 03/11/21 Family notified: Son, Aaron Edelman Transport by: Veleta Miners   Per MD patient ready for DC to [facility]. RN to call report prior to discharge [phone] room [id number]). RN, patient, patient's family, and facility notified of DC. Discharge Summary and FL2 sent to facility. DC packet on chart. Ambulance transport requested for patient.   CSW will sign off for now as social work intervention is no longer needed. Please consult [place] again if new needs arise.       Barriers to Discharge: Continued Medical Work up, Ship broker   Patient Goals and CMS Choice Patient states their goals for this hospitalization and ongoing recovery are:: Rehab CMS Medicare.gov Compare Post Acute Care list provided to:: Patient Represenative (must comment) Choice offered to / list presented to : Adult Children  Discharge Placement                       Discharge Plan and Services In-house Referral: Clinical Social Work   Post Acute Care Choice: [place]                               Social Determinants of Health (SDOH) Interventions     Readmission Risk Interventions No flowsheet data found.

## 2021-03-11 NOTE — Discharge Summary (Signed)
Physician Discharge Summary  Angel Cooper:[id number] DOB: 11-08-1930 DOA: 03/01/2021  PCP: Angel Orn, MD  Admit date: 03/01/2021 Discharge date: 03/11/2021  Admitted From: Home Disposition:  SNF   Recommendations for Outpatient Follow-up:  Please check CBC, BMP in 3 days, and adjust diuresis as needed. Please keep encouraging to use incentive spirometer.   Discharge Condition:Stable, But patient is high risk for readmission given his advanced age and multiple comorbidities and frailty CODE STATUS:FULL Diet recommendation: Heart Healthy  Brief/Interim Summary:  88 year old male with CKD 3B, paroxysmal A. fib, history of renal resection due to Angel Cooper, HTN, HLD comes into the hospital with abdominal pain for the past 3 days.  Apparently he was diagnosed with COVID as an outpatient about 2 weeks ago, was prescribed medication but never took them.  He saw his PCP a few days ago, had some respiratory symptoms and a chest x-ray showed pneumonia.  He then developed abdominal discomfort and came to the hospital.  He was found to have a small bowel obstruction, A. fib with RVR as well as left lobar pneumonia.  General surgery consulted.  He improved with conservative management and his NG tube was discontinued 2/6.  PT recommends SNF - as of 2/7, patient with progressive dyspnea, increased work of breathing, he did require NRB, transition during the day to 15 L high flow nasal cannula, work-up significant for volume overload, with large left> right pleural effusion.  Had thoracentesis done by PCCM, respiratory status continues to improve with diuresis.He is currently on 2 L Angel Cooper.    Acute hypoxic respiratory failure -2/7 patient developed significant dyspnea, hypoxia with oxygen requirement up to 15 L high flow nasal cannula.This is exacerbated by volume overload and pleural effusion, with underlying  atelectasis and severe deconditioning, this has significantly improved, he is currently on 2 L nasal  cannula, -Improved after thoracentesis 2/8 -Please encourage use incentive spirometer all the time. -See discussion below under CHF.  Pleural effusion -CT chest this morning significant for bilateral pleural effusion,. -Input greatly appreciated, status post thoracentesis with 500 cc pleural fluid out of right pleural space  -Was encouraged to use incentive spirometer, flutter valve .     Partial small bowel obstruction (Angel Cooper)- (present on admission) - General surgery consulted and followed patient while hospitalized.  He was treated with conservative management with NG tube, IV fluids, n.p.o.  He improved, NG tube was discontinued 2/6 and his diet is slowly advanced.  He is doing better this morning -Tolerating soft diet, no nausea, no vomiting   Atrial fibrillation with RVR (Angel Cooper)- (present on admission) - Patient was started on Cardizem infusion while strict n.p.o.,, once his diet was advanced 2/6 he was placed on oral Cardizem 30 every 6.  He is off IV infusion, heart rate much better controlled.  Will consolidate and switch to 60 mg Q2 and potentially on discharge could go to 120 every 24 extended release.  He was on heparin infusion while n.p.o. and converted to Eliquis.  He was not on anticoagulation prior to admission.  I discussed with patient's son over the phone, they are not sure why he is not on anticoagulation but believe he may have not "felt good" in the past but they do not remember exactly.  He has not had any falls and is otherwise active, somewhat limited by his knees.  Start Eliquis   CAP (community acquired pneumonia) -Started on antibiotics with ceftriaxone and azithromycin, treat for 5 days   AKI on Chronic kidney  disease, stage 3b (Angel Cooper)- (present on admission) - Baseline creatinine around 1.5.  Currently at baseline.   -Creatinine has peaked at 2.1, most likely due to cardiorenal syndrome, now improving with IV diuresis.  Most recent creatinine is 1.3.   Essential  hypertension- (present on admission) - Currently on furosemide, diltiazem.  Stable   Anemia of chronic disease- (present on admission) -Hemoglobin stable, no bleeding   Acute on Chronic combined systolic and diastolic heart failure (Angel Cooper) - 2D echo done this hospitalization shows an EF of 35-40%.  Patient has a history of coronary artery disease, and CABG in 1999.  His most recent 2D echo in the system is in 2015 and EF at that time was 45 to 50% with diffuse hypokinesis.   -Continue with diuresis as tolerated, I have started on low-dose midodrine to allow more room in blood pressure for diuresis.   -Patient was on his home dose Lasix 80 mg oral daily which he tolerated well, and he did require some IV diuresis as well, so he was almost receiving 80 mg of oral Lasix daily with 40 of milligram IV evening time, so his dose has been adjusted to 60 mg oral twice daily.   Right shoulder pain due to gout - Started on 2/5, patient reports history of gouty attack at various sites of the body, this reminds him of one.  Received 125 Solu-Medrol 2/6, started on colchicine, and oral prednisone, symptoms has totally resolved.   History of COVID-19 -Son at bedside, reports father tested positive for 1 /13 / 2023, he was prescribed oral antiviral by PCP but he did not take.  . -Patient is 21 days currently from his initial infection, no need for COVID isolation, isolation has been discontinued .   Personal history of other malignant neoplasm of kidney - Noted   Leukocytosis - Due to pneumonia as well as steroid use   CAD (coronary artery disease) of artery bypass graft- (present on admission) - No chest pain, history of CABG in 1999  Discharge Diagnoses:  Principal Problem:   Partial small bowel obstruction (Angel Cooper) Active Problems:   Acute respiratory failure with hypoxia (Angel Cooper)   Atrial fibrillation with RVR (Angel Cooper)   CAD (coronary artery disease) of artery bypass graft   Chronic kidney disease, stage  3b (Angel Cooper)   Leukocytosis   Essential hypertension   Anemia of chronic disease   Personal history of other malignant neoplasm of kidney   CAP (community acquired pneumonia)   History of COVID-19   Right shoulder pain due to gout   Protein-calorie malnutrition, severe   Acute on chronic combined systolic and diastolic CHF (congestive heart failure) (Angel Cooper)   Pleural effusion    Discharge Instructions  Discharge Instructions     Diet - low sodium heart healthy   Complete by: As directed    Discharge instructions   Complete by: As directed    Follow with Primary MD Angel Orn, MD / SNF Physician  Get CBC, CMP,  checked  by Primary MD next visit.    Activity: As tolerated with Full fall precautions use walker/cane & assistance as needed   Disposition SNF   Diet: Heart Healthy , with feeding assistance and aspiration precautions.  For Heart failure patients - Check your Weight same time everyday, if you gain over 2 pounds, or you develop in leg swelling, experience more shortness of breath or chest pain, call your Primary MD immediately. Follow Cardiac Low Salt Diet and 1.5 lit/day fluid restriction.  On your next visit with your primary care physician please Get Medicines reviewed and adjusted.   Please request your Prim.MD to go over all Hospital Tests and Procedure/Radiological results at the follow up, please get all Hospital records sent to your Prim MD by signing hospital release before you go home.   If you experience worsening of your admission symptoms, develop shortness of breath, life threatening emergency, suicidal or homicidal thoughts you must seek medical attention immediately by calling 911 or calling your MD immediately  if symptoms less severe.  You Must read complete instructions/literature along with all the possible adverse reactions/side effects for all the Medicines you take and that have been prescribed to you. Take any new Medicines after you have  completely understood and accpet all the possible adverse reactions/side effects.   Do not drive, operating heavy machinery, perform activities at heights, swimming or participation in water activities or provide baby sitting services if your were admitted for syncope or siezures until you have seen by Primary MD or a Neurologist and advised to do so again.  Do not drive when taking Pain medications.    Do not take more than prescribed Pain, Sleep and Anxiety Medications  Special Instructions: If you have smoked or chewed Tobacco  in the last 2 yrs please stop smoking, stop any regular Alcohol  and or any Recreational drug use.  Wear Seat belts while driving.   Please note  You were cared for by a hospitalist during your hospital stay. If you have any questions about your discharge medications or the care you received while you were in the hospital after you are discharged, you can call the unit and asked to speak with the hospitalist on call if the hospitalist that took care of you is not available. Once you are discharged, your primary care physician will handle any further medical issues. Please note that NO REFILLS for any discharge medications will be authorized once you are discharged, as it is imperative that you return to your primary care physician (or establish a relationship with a primary care physician if you do not have one) for your aftercare needs so that they can reassess your need for medications and monitor your lab values.   Increase activity slowly   Complete by: As directed       Allergies as of 03/11/2021       Reactions   Aspirin Other (See Comments)   Stomach pain    Chlorhexidine Other (See Comments)   CAUSED SKIN IRRITATION AND BURNING SENSATION   Enalapril Other (See Comments)   Cannot take due to kidney compromise   Gabapentin Other (See Comments)   Caused nightmares   Statins Other (See Comments)   Myalgia        Medication List     STOP taking  these medications    amLODipine 10 MG tablet Commonly known as: NORVASC   amoxicillin 500 MG capsule Commonly known as: AMOXIL   bisacodyl 10 MG/30ML Enem Commonly known as: FLEET   doxycycline 100 MG tablet Commonly known as: VIBRA-TABS   traMADol 50 MG tablet Commonly known as: ULTRAM       TAKE these medications    acetaminophen 500 MG tablet Commonly known as: TYLENOL Take 1 tablet (500 mg total) by mouth every 4 (four) hours as needed for mild pain or headache. What changed:  how much to take when to take this   albuterol (2.5 MG/3ML) 0.083% nebulizer solution Commonly known as: PROVENTIL Take  3 mLs (2.5 mg total) by nebulization every 4 (four) hours as needed for shortness of breath or wheezing.   allopurinol 100 MG tablet Commonly known as: ZYLOPRIM Take 0.5 tablets (50 mg total) by mouth daily.   ALPRAZolam 0.25 MG tablet Commonly known as: XANAX Take 1 tablet (0.25 mg total) by mouth at bedtime as needed for sleep.   apixaban 2.5 MG Tabs tablet Commonly known as: ELIQUIS Take 1 tablet (2.5 mg total) by mouth 2 (two) times daily.   B-12 Compliance Injection 1000 MCG/ML Kit Generic drug: Cyanocobalamin Inject 1,000 mcg as directed every 30 (thirty) days.   diltiazem 60 MG 12 hr capsule Commonly known as: CARDIZEM SR Take 1 capsule (60 mg total) by mouth every 12 (twelve) hours.   feeding supplement Liqd Take 237 mLs by mouth 3 (three) times daily between meals.   furosemide 40 MG tablet Commonly known as: LASIX Take 1.5 tablets (60 mg total) by mouth 2 (two) times daily. What changed:  how much to take when to take this   HYDROcodone-acetaminophen 5-325 MG tablet Commonly known as: NORCO/VICODIN Take 1 tablet by mouth every 6 (six) hours as needed for severe pain.   midodrine 5 MG tablet Commonly known as: PROAMATINE Take 0.5 tablets (2.5 mg total) by mouth 2 (two) times daily with a meal.   multivitamin Tabs tablet Take 1 tablet by mouth  at bedtime.   pantoprazole 40 MG tablet Commonly known as: PROTONIX Take 1 tablet (40 mg total) by mouth daily at 12 noon.   polyethylene glycol 17 g packet Commonly known as: MIRALAX / GLYCOLAX Take 17 g by mouth daily as needed.   sennosides-docusate sodium 8.6-50 MG tablet Commonly known as: SENOKOT-S Take 2 tablets by mouth daily.        Contact information for after-discharge care     [facility] .   Service: Skilled Nursing Contact information: [place] [phone]                    Allergies  Allergen Reactions   Aspirin Other (See Comments)    Stomach pain    Chlorhexidine Other (See Comments)    CAUSED SKIN IRRITATION AND BURNING SENSATION   Enalapril Other (See Comments)    Cannot take due to kidney compromise   Gabapentin Other (See Comments)    Caused nightmares   Statins Other (See Comments)    Myalgia    Consultations: PCCM   Procedures/Studies: CT ABDOMEN PELVIS WO CONTRAST  Result Date: 03/01/2021 CLINICAL DATA:  Abdominal pain and constipation for 3-4 days. EXAM: CT ABDOMEN AND PELVIS WITHOUT CONTRAST TECHNIQUE: Multidetector CT imaging of the abdomen and pelvis was performed following the standard protocol without IV contrast. RADIATION DOSE REDUCTION: This exam was performed according to the departmental dose-optimization program which includes automated exposure control, adjustment of the mA and/or kV according to patient size and/or use of iterative reconstruction technique. COMPARISON:  04/17/2018 FINDINGS: Lower chest: Bilateral peripheral predominant multifocal peribronchovascular ground-glass and airspace densities are noted compatible with multifocal infection. Atypical viral infection is not excluded. Small left pleural effusion. Hepatobiliary: There is no focal liver abnormality. Gallbladder unremarkable. Pancreas: Unremarkable. No  pancreatic ductal dilatation or surrounding inflammatory changes. Spleen: Normal in size without focal abnormality. Adrenals/Urinary Tract: Normal adrenal glands. Status post right nephrectomy. Left kidney is unremarkable. Bladder is unremarkable. Stomach/Bowel: Moderate to large hiatal hernia is identified. Stomach appears nondistended.  The proximal and mid small bowel loops are increased in caliber with multiple air-fluid levels measuring up to 3.1 cm. The distal small bowel loops are decreased in caliber. There is an edematous appearing loop of small bowel within the left lower quadrant of the abdomen which is difficult to definitively characterize due to lack of IV and enteric contrast material, but may represent the transition point. The colon appears largely decompressed. Scattered colonic diverticula are identified. Vascular/Lymphatic: Aortic atherosclerosis. No abdominopelvic adenopathy. Signs of venous congestion noted within the lower abdominal mesentery, image 61/3. Reproductive: Prostate is unremarkable. Other: Small volume of ascites identified within the abdomen and pelvis. No signs of pneumoperitoneum. Musculoskeletal: No acute or significant osseous findings. Chronic bilateral L5 pars defects noted with 7 mm anterolisthesis of L5 on S1. Multilevel disc space narrowing and endplate spurring identified IMPRESSION: 1. Examination is positive for small bowel obstruction. The transition point is difficult to identify larger reflecting lack of IV and oral contrast material. 2. There is an edematous appearing loop of small bowel within the left lower quadrant of the abdomen which is difficult to definitively characterize due to lack of IV and enteric contrast material. This may represent a focal segmental area of inflammatory or infectious enteritis. 3. Small volume of ascites identified within the abdomen and pelvis. 4. Bilateral peripheral predominant multifocal peribronchovascular ground-glass and  airspace densities are noted compatible with multifocal infection. Atypical viral infection is not excluded. 5. Small left pleural effusion. 6. Hiatal hernia. 7. Chronic bilateral L5 pars defects with 7 mm anterolisthesis of L5 on S1. 8. Aortic Atherosclerosis (ICD10-I70.0). Electronically Signed   By: Kerby Moors M.D.   On: 03/01/2021 18:45   DG Chest 1 View  Result Date: 03/06/2021 CLINICAL DATA:  Follow-up pleural effusion. EXAM: CHEST  1 VIEW COMPARISON:  Prior today FINDINGS: Small left pleural effusion shows decreased size since prior study. No pneumothorax visualized. Elevated left hemidiaphragm again seen. Atelectasis or infiltrate in left lung base is unchanged. Right upper lobe airspace disease is also stable. Gas again seen within moderate to large hiatal hernia. Stable cardiomegaly.  Prior CABG again noted. IMPRESSION: Decreased size of small left pleural effusion. No pneumothorax visualized. Stable left lower lobe atelectasis versus infiltrate. Stable right upper lobe airspace disease. Electronically Signed   By: Marlaine Hind M.D.   On: 03/06/2021 15:57   DG Chest 2 View  Result Date: 02/28/2021 CLINICAL DATA:  Chest tightness EXAM: CHEST - 2 VIEW COMPARISON:  02/01/2021. FINDINGS: Low lung volumes. New patchy airspace and interstitial opacities within bilateral mid lungs and the right lung base. No visible pleural effusions or pneumothorax. Cardiomediastinal silhouette is similar. Median sternotomy. Polyarticular degenerative change. IMPRESSION: 1. New patchy airspace and interstitial opacities within bilateral mid lungs and the right lung base, primarily concerning for multifocal pneumonia. An element of mild edema is possible. 2. Given somewhat masslike appearance of the left midlung opacity, recommend followup PA and lateral chest X-ray in 3-4 weeks following trial of antibiotic therapy to ensure resolution and exclude underlying malignancy. These results will be called to the ordering  clinician or representative by the Radiologist Assistant, and communication documented in the PACS or [employer]. Electronically Signed   By: Margaretha Sheffield M.D.   On: 02/28/2021 11:24   DG Abd 1 View  Result Date: 03/03/2021 CLINICAL DATA:  Abdominal pain EXAM: ABDOMEN - 1 VIEW COMPARISON:  03/02/2021 FINDINGS: There is dilation of small-bowel loops measuring up to 3.9 cm in diameter. Stomach is not distended.  There is contrast in the right lower chest, possibly within fixed hiatal hernia. Gas is present in colon and rectum. Tip enteric tube is seen in the region of antrum of the stomach. Evaluation for pneumoperitoneum is limited in this supine radiograph. IMPRESSION: There is dilation of small-bowel loops suggesting partial small bowel obstruction. There is contrast within fixed hiatal hernia. Tip of enteric tube is seen in the region of antrum of the stomach in the right upper quadrant of abdomen. Electronically Signed   By: Elmer Picker M.D.   On: 03/03/2021 11:57   DG Abdomen 1 View  Result Date: 03/02/2021 CLINICAL DATA:  NG tube placement. EXAM: ABDOMEN - 1 VIEW COMPARISON:  03/01/2021 FINDINGS: Low volume film with cardiomegaly and patchy bilateral airspace disease. NG tube tip projects over the lower right mediastinum. The tube does not follow the course of the airway in tip position is compatible with the patient's known large right-sided hiatal hernia (CT 03/01/2021). Diffuse gaseous bowel distention noted right upper quadrant. IMPRESSION: 1. NG tube tip projects over the lower right mediastinum, compatible with placement in the known large right-sided hiatal hernia. 2. Patchy bilateral airspace disease. Electronically Signed   By: Misty Stanley M.D.   On: 03/02/2021 04:58   CT CHEST WO CONTRAST  Result Date: 03/06/2021 CLINICAL DATA:  Hypoxia, COVID versus bacterial pneumonia, volume overload. EXAM: CT CHEST WITHOUT CONTRAST TECHNIQUE: Multidetector CT imaging of the chest was  performed following the standard protocol without IV contrast. RADIATION DOSE REDUCTION: This exam was performed according to the departmental dose-optimization program which includes automated exposure control, adjustment of the mA and/or kV according to patient size and/or use of iterative reconstruction technique. COMPARISON:  CT abdomen pelvis 03/01/2021 and CT chest 05/09/2016. FINDINGS: Cardiovascular: Atherosclerotic calcification of the aorta with a probable surgical plaque in the transverse portion. Aortic valvular calcification. Pulmonic trunk and heart are enlarged. No pericardial effusion. Mediastinum/Nodes: Mediastinal lymph nodes are not enlarged by CT size criteria. Hilar regions are difficult to evaluate without IV contrast. No axillary adenopathy. Moderate to large hiatal hernia. Esophagus is otherwise grossly unremarkable. Lungs/Pleura: Patchy bilateral ground-glass and septal thickening with some associated consolidation. Large bilateral pleural effusions with collapse/consolidation in the left lower lobe. Mild central bronchiectasis. Airway is unremarkable. Upper Abdomen: Visualized portions of the liver, adrenal glands, left kidney, spleen and pancreas are unremarkable. Moderate to large hiatal hernia. Minimally distended fluid-filled loops of proximal small bowel. Small ascites. Musculoskeletal: Degenerative changes in the spine no worrisome lytic or sclerotic lesions. IMPRESSION: 1. Diffuse patchy ground-glass, septal thickening and scattered consolidation, findings suggestive of COVID-19 pneumonia. 2. Large bilateral pleural effusions with left lower lobe collapse/consolidation, possibly a combination of atelectasis and pneumonia. 3. Mild central bronchiectasis. 4. Moderate to large hiatal hernia. 5. Mildly distended fluid-filled loops of small bowel in the left abdomen, compatible with the history of small-bowel obstruction on 03/01/2021. 6. Small ascites. 7.  Aortic atherosclerosis  (ICD10-I70.0). 8. Enlarged pulmonic trunk, indicative of pulmonary arterial hypertension. Electronically Signed   By: Lorin Picket M.D.   On: 03/06/2021 10:55   DG CHEST PORT 1 VIEW  Result Date: 03/06/2021 CLINICAL DATA:  Dyspnea EXAM: PORTABLE CHEST 1 VIEW COMPARISON:  03/01/2021 FINDINGS: Lung volumes are slightly small though pulmonary insufflation remain stable since prior examination. Superimposed multifocal pulmonary consolidation has progressed in the interval since prior examination, particular within the right upper and lower lung zones. Small to moderate left and small right pleural effusions have developed. No pneumothorax. Coronary artery bypass  grafting has been performed. Mild cardiomegaly is stable. Moderate hiatal hernia again noted. IMPRESSION: Stable pulmonary hypoinflation. Progressive multifocal pulmonary consolidation in keeping with multifocal pneumonia in the appropriate clinical setting. Interval development of bilateral pleural effusions, left greater than right. Stable cardiomegaly. Moderate hiatal hernia. Electronically Signed   By: Fidela Salisbury M.D.   On: 03/06/2021 01:25   DG Chest Portable 1 View  Result Date: 03/01/2021 CLINICAL DATA:  Pneumonia EXAM: PORTABLE CHEST 1 VIEW COMPARISON:  Previous studies including the examination of 02/28/2021 FINDINGS: Transverse diameter of heart is increased. There is possible hiatal hernia in the retrocardiac region. There is evidence of previous coronary bypass surgery. Left hemidiaphragm is elevated. There is poor inspiration. There is patchy infiltrate in the left mid and left lower lung fields with worsening of infiltrate in the medial left lower lung fields. There are small linear densities in the right parahilar region and right lower lung fields with no significant change. There is blunting of left lateral CP angle. There is no pneumothorax. IMPRESSION: There are patchy infiltrates in the left parahilar region and left lower lung  fields with interval worsening suggesting worsening pneumonia. There is slight improvement in aeration of right parahilar region suggesting decrease in infiltrate. There is residual prominence of interstitial markings in the right parahilar region and right lower lung fields suggesting possible underlying scarring and superimposed interstitial pneumonia. Possible small left pleural effusion. Electronically Signed   By: Elmer Picker M.D.   On: 03/01/2021 18:38   DG Shoulder Right Port  Result Date: 03/03/2021 CLINICAL DATA:  Pain right shoulder EXAM: RIGHT SHOULDER - 1 VIEW COMPARISON:  None. FINDINGS: No fracture or dislocation is seen. There is decreased distance between humeral head and acromion. Degenerative changes are noted with bony spurs in the shoulder and AC joints. Osteopenia is seen in bony structures. There is evidence of previous coronary bypass surgery. Enteric tube is noted traversing the esophagus with its distal portion in the hiatal hernia. IMPRESSION: No recent fracture or dislocation is seen. Decreased distance between humeral head and acromion may suggest chronic tear of rotator cuff. Degenerative changes are noted in right shoulder and right AC joints with bony spurs. Electronically Signed   By: Elmer Picker M.D.   On: 03/03/2021 11:52   DG Abd Portable 1V  Result Date: 03/04/2021 CLINICAL DATA:  Small bowel obstruction EXAM: PORTABLE ABDOMEN - 1 VIEW COMPARISON:  03/03/2021 FINDINGS: Nasogastric tube tip antropyloric. Contrast medium in the bottom of the hiatal hernia noted. There is also some focally concentrated contrast medium in the left upper quadrant. There is also some dilute contrast medium currently visible in the transverse and descending colon. Most of the small bowel is relatively gasless. There is a mildly dilated left upper quadrant loop of jejunum measuring 3.1 cm in diameter, previous loops in this vicinity up to 4.2 cm. Dextroconvex thoracolumbar scoliosis  noted with evidence of underlying spondylosis and degenerative disc disease. Questionable airspace opacity at the right lung base. IMPRESSION: 1. Left upper quadrant loop of jejunum is mildly dilated at 3.1 cm. Previous loops in this vicinity measured up to 4.2 cm. 2. There is dilute contrast medium in the transverse and descending colon. Small amount of concentrated contrast medium in the expected vicinity of the hiatal hernia, and in the left upper quadrant. Electronically Signed   By: Van Clines M.D.   On: 03/04/2021 08:20   DG Abd Portable 1V-Small Bowel Obstruction Protocol-initial, 8 hr delay  Result Date: 03/02/2021 CLINICAL DATA:  Small-bowel obstruction. EXAM: PORTABLE ABDOMEN - 1 VIEW COMPARISON:  Comparison studies from earlier same day. FINDINGS: Current study performed at 6 p.m. Contrast is seen within the stomach. No contrast seen within the more distal small bowel. Distended gas-filled small bowel loops throughout the abdomen and upper pelvis, consistent with a distal small-bowel obstruction. IMPRESSION: Distal small bowel obstruction. Contrast is seen within the stomach. No contrast passage into the more distal small bowel. Electronically Signed   By: Franki Cabot M.D.   On: 03/02/2021 19:07   DG Abd Portable 1V-Small Bowel Protocol-Position Verification  Result Date: 03/02/2021 CLINICAL DATA:  88 year old male NG tube advancement. Known large hiatal hernia. EXAM: PORTABLE ABDOMEN - 1 VIEW COMPARISON:  0425 hours. FINDINGS: Portable AP upright view at 0435 hours. Enteric tube has been advanced further in the known gastric hiatal hernia. Side hole now projects near the level of the tube tip on the prior exam. Tube tip now in the midline, but still above the diaphragm. Otherwise stable chest and abdomen. IMPRESSION: Enteric tube advanced further into the known gastric hiatal hernia, remaining above the diaphragm. Electronically Signed   By: Genevie Ann M.D.   On: 03/02/2021 05:24   DG Abd  Portable 1V-Small Bowel Protocol-Position Verification  Result Date: 03/01/2021 CLINICAL DATA:  NG tube placement. EXAM: PORTABLE ABDOMEN - 1 VIEW COMPARISON:  03/01/2021. FINDINGS: Mildly distended loops of small bowel are seen in the abdomen measuring up to 3 cm. There is a moderate to large hiatal hernia to the right of midline. The NG tube terminates in the region of a hiatal hernia to the right of midline. Sternotomy wires are noted over the midline. Strandy opacities are present in the lungs. Degenerative changes are noted in the thoracolumbar spine. IMPRESSION: 1. The NG tube terminates in the region of a moderate to large hiatal hernia and repositioning is recommended. 2. Mildly distended loops of small bowel in the abdomen measuring up to 3.0 cm, possible small bowel obstruction. 3. Strandy opacities at the lung bases, possible atelectasis, edema, or infiltrate. Electronically Signed   By: Brett Fairy M.D.   On: 03/01/2021 21:24   DG Swallowing Func-Speech Pathology  Result Date: 03/07/2021 Table formatting from the original result was not included. Objective Swallowing Evaluation: Type of Study: MBS-Modified Barium Swallow Study  Patient Details Name: SAYVON ARTERBERRY MRN: [id number] Date of Birth: 1930-11-12 Today's Date: 03/07/2021 Time: SLP Start Time (ACUTE ONLY): 2330 -SLP Stop Time (ACUTE ONLY): 1218 SLP Time Calculation (min) (ACUTE ONLY): 19 min Past Medical History: Past Medical History: Diagnosis Date  Atrial fibrillation ([place])   2015  W/ RVR  Bladder tumor   CHF (congestive heart failure) ([place]) 2015  CKD (chronic kidney disease), stage III ([place])   Coronary artery disease   NO LONGER SEES CARDIOLOGIST - LAST SAW DR. 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) urologist-  dr herrick/  oncologist- dr [name]  dx 2014  s/p  right nephroureterectomy 01-24-2013 (T3, N0) invasive high grade  urothelial carcinoma--  Past Surgical History: Past Surgical History: Procedure Laterality Date  AMPUTATION FINGER Left 12/07/2018  Procedure: Amputation Left Long Finger;  Surgeon: Roseanne Kaufman, MD;  Location: [place];  Service: Orthopedics;  Laterality: Left;  CARDIAC CATHETERIZATION  07/05/1999  non-q MI;  3vessel CAD w/ high grade pLAD and mLAD, subtotal ramus branch,  OM1 70-80%,  ostialCFX 50-60%,  dCFx 80%,  RI 99%,  total occluded pRCA and fills last by right-to-right collaterals,  pLM  20% narrowing;  severe inferoapical hypokinesis  CATARACT EXTRACTION W/ INTRAOCULAR LENS  IMPLANT, BILATERAL    CORONARY ARTERY BYPASS GRAFT  06/ 2001  dr [name] tright  CYSTOSCOPY W/ RETROGRADES Left 01/31/2016  Procedure: CYSTOSCOPY WITH RETROGRADE PYELOGRAM;  Surgeon: [name], MD;  Location: [facility];  Service: Urology;  Laterality: Left;  CYSTOSCOPY WITH RETROGRADE PYELOGRAM, URETEROSCOPY AND STENT PLACEMENT Right 12/03/2012  Procedure: CYSTOSCOPY WITH RIGHT RETROGRADE PYELOGRAM,RIGHT URETERAL WASHINGS, RIGHT URETERAL BIOPSY, RIGHT URETEROSCOPY AND STENT PLACEMENT;  Surgeon: [name], MD;  Location: [facility];  Service: Urology;  Laterality: Right;  CYSTOSCOPY WITH RETROGRADE PYELOGRAM, URETEROSCOPY AND STENT PLACEMENT Right 12/15/2012  Procedure: CYSTOSCOPY WITH RIGHT RETROGRADE PYELOGRAM, POSSIBLE RIGHT URETEROSCOPY AND STENT PLACEMENT AND REMOVAL OF LEFT STENT;  Surgeon: [name], MD;  Location: [facility];  Service: Urology;  Laterality: Right;  CYSTOSCOPY WITH STENT PLACEMENT Right 01/24/2013  Procedure: CYSTOSCOPY WITH STENT PLACEMENT;  Surgeon:  [name], MD;  Location: [facility];  Service: Urology;  Laterality: Right;  I & D EXTREMITY Left 12/07/2018  Procedure: IRRIGATION AND DEBRIDEMENT EXTREMITY;  Surgeon: Roseanne Kaufman, MD;  Location: [place];  Service: Orthopedics;  Laterality: Left;  INCISION AND DRAINAGE ABSCESS Left 06/02/2016  Procedure: INCISION AND DRAINAGE LEFT SMALL FINGER;  Surgeon: [name], MD;  Location: [place];  Service: Orthopedics;  Laterality: Left;  ROBOT ASSITED LAPAROSCOPIC NEPHROURETERECTOMY Right 01/24/2013  Procedure: ROBOT ASSISTED LAPAROSCOPIC NEPHROURETERECTOMY WITH BILATERAL LYMPH NODE DISSECTION ;  Surgeon: [name], MD;  Location: [facility];  Service: Urology;  Laterality: Right;  right kidney  TRANSTHORACIC ECHOCARDIOGRAM  01/28/2013  mild LVH,  ef 45-50%, diffuse hypokinesis,  grade 1 diastolic dysfunction/  moderate LAE/  trivial PR/  mild TR, PASP 50-88mHg  TRANSURETHRAL RESECTION OF BLADDER TUMOR N/A 01/31/2016  Procedure: TRANSURETHRAL RESECTION OF BLADDER TUMOR (TURBT);  Surgeon: BArdis Hughs MD;  [facility]  Service: Urology;  Laterality: N/A; HPI: 88y/o male admitted 03/01/21 for Abd pain and constipation x 3 days. Recently seen PCP 2 weeks ago after diagnosed with COVID and CXR showed PNA. Per PCCM note 03/06/21, there is suspicion for silent reflux/aspiration due to known large hiatal hernia. Pt with SBO, Afib with RVR, and PNA. PMH: GERD, CKD, Afib, hx of renal resection due to [place] HTN  Subjective: denies trouble swallowing  Recommendations for follow up therapy are one component of a multi-disciplinary discharge planning process, led by the attending physician.  Recommendations may be updated based on patient status, additional functional criteria and insurance authorization. Assessment / Plan / Recommendation Clinical Impressions 03/07/2021 Clinical Impression Pt has a mild pharyngoesophageal dysphagia with no aspiration observed throughout the  study. Base of tongue retraction is mildly reduced, leading to incomplete epiglottic deflection and as a result, pt has mild valleculae residue and intermittent penetration of thin liquids during the swallow. Penetration clears spontaneously (PAS 2) at least during subsequent swallows. He has the appearance of a prominent CP as wella  s reduced UES relaxation with suspected cervical osteophytes that could be contributing, but these do not appear to significantly impact bolus flow. Recommend continuing with regular solids and thin liquids with use of general aspiration precautions. Will f/u briefly for reinforcement and education as he remains inpatient. SLP Visit Diagnosis Dysphagia, pharyngoesophageal phase (R13.14) Attention and concentration deficit following -- Frontal lobe and executive function deficit following -- Impact on safety and function Mild aspiration risk   Treatment Recommendations 03/07/2021 Treatment Recommendations Therapy as outlined in treatment plan below   Prognosis 03/07/2021 Prognosis for Safe Diet Advancement Good Barriers to Reach Goals -- Barriers/Prognosis Comment -- Diet Recommendations 03/07/2021 SLP Diet Recommendations Regular solids;Thin liquid Liquid Administration via Cup;Straw Medication Administration Whole meds with liquid Compensations Slow rate;Small sips/bites Postural Changes Seated upright at 90 degrees;Remain semi-upright after after feeds/meals (Comment)   Other Recommendations 03/07/2021 Recommended Consults -- Oral Care Recommendations Oral care BID Other Recommendations -- Follow Up Recommendations No SLP follow up Assistance recommended at discharge PRN Functional Status Assessment Patient has not had a recent decline in their functional status Frequency and Duration  03/07/2021 Speech Therapy Frequency (ACUTE ONLY) min 1 x/week Treatment Duration 1 week   Oral Phase 03/07/2021 Oral Phase WFL Oral - Pudding Teaspoon -- Oral - Pudding Cup -- Oral - Honey Teaspoon -- Oral - Honey  Cup -- Oral - Nectar Teaspoon -- Oral - Nectar Cup -- Oral - Nectar Straw -- Oral - Thin Teaspoon -- Oral - Thin Cup -- Oral - Thin Straw -- Oral - Puree -- Oral - Mech Soft -- Oral - Regular -- Oral - Multi-Consistency -- Oral - Pill -- Oral Phase - Comment --  Pharyngeal Phase 03/07/2021 Pharyngeal Phase Impaired Pharyngeal- Pudding Teaspoon -- Pharyngeal -- Pharyngeal- Pudding Cup -- Pharyngeal -- Pharyngeal- Honey Teaspoon -- Pharyngeal -- Pharyngeal- Honey Cup -- Pharyngeal -- Pharyngeal- Nectar Teaspoon -- Pharyngeal -- Pharyngeal- Nectar Cup -- Pharyngeal -- Pharyngeal- Nectar Straw -- Pharyngeal -- Pharyngeal- Thin Teaspoon -- Pharyngeal -- Pharyngeal- Thin Cup Reduced tongue base retraction;Reduced epiglottic inversion;Pharyngeal residue - valleculae;Penetration/Aspiration during swallow Pharyngeal Material enters airway, remains ABOVE vocal cords then ejected out Pharyngeal- Thin Straw Reduced tongue base retraction;Reduced epiglottic inversion;Pharyngeal residue - valleculae;Penetration/Aspiration during swallow Pharyngeal Material enters airway, remains ABOVE vocal cords then ejected out Pharyngeal- Puree Reduced tongue base retraction;Reduced epiglottic inversion;Pharyngeal residue - valleculae Pharyngeal -- Pharyngeal- Mechanical Soft -- Pharyngeal -- Pharyngeal- Regular Reduced tongue base retraction;Reduced epiglottic inversion;Pharyngeal residue - valleculae Pharyngeal -- Pharyngeal- Multi-consistency -- Pharyngeal -- Pharyngeal- Pill Reduced tongue base retraction;Reduced epiglottic inversion Pharyngeal -- Pharyngeal Comment --  Cervical Esophageal Phase  03/07/2021 Cervical Esophageal Phase Impaired Pudding Teaspoon -- Pudding Cup -- Honey Teaspoon -- Honey Cup -- Nectar Teaspoon -- Nectar Cup -- Nectar Straw -- Thin Teaspoon -- Thin Cup Reduced cricopharyngeal relaxation;Prominent cricopharyngeal segment Thin Straw Reduced cricopharyngeal relaxation;Prominent cricopharyngeal segment Puree Reduced  cricopharyngeal relaxation;Prominent cricopharyngeal segment Mechanical Soft -- Regular Reduced cricopharyngeal relaxation;Prominent cricopharyngeal segment Multi-consistency -- Pill Reduced cricopharyngeal relaxation;Prominent cricopharyngeal segment Cervical Esophageal Comment -- Osie Bond., M.A. [facility] Pager [phone] Office [phone] 03/07/2021, 2:07 PM                     ECHOCARDIOGRAM COMPLETE  Result Date: 03/03/2021    ECHOCARDIOGRAM REPORT   Patient Name:   Angel Cooper Date of Exam: 03/03/2021 Medical Rec #:  [id number]         Height:       64.0 in Accession #:  [id number]        Weight:       128.3 lb Date of Birth:  08/01/1930          BSA:          1.620 m Patient Age:    43 years          BP:           129/63 mmHg Patient Gender: M                 HR:           77 bpm. Exam Location:  Inpatient Procedure: 2D Echo, Cardiac Doppler and Color Doppler Indications:    Atrial Fibrillation  History:        Patient has prior history of Echocardiogram examinations, most                 recent 01/28/2013. CAD, Prior CABG, Arrythmias:Atrial                 Fibrillation; Risk Factors:Hypertension.  Sonographer:    Wenda Low Referring Phys: [place] Comments: [place]  1. Global moderate hypokinesis of the LV. Akinetic anteroseptal/inferoseptal wall from base to the apex. Consistent with prior CABG. Left ventricular ejection fraction, by estimation, is 35 to 40%. The left ventricle has moderately decreased function. The left ventricle demonstrates global hypokinesis. Left ventricular diastolic parameters are consistent with Grade I diastolic dysfunction (impaired relaxation).  2. IVC not well visualized to estimate RVSP. Right ventricular systolic function is normal. The right ventricular size is normal.  3. Left atrial size was severely dilated.  4. Right atrial size was moderately dilated.  5. Mild mitral valve regurgitation.  6. The  aortic valve was not well visualized. Aortic valve regurgitation is mild to moderate. Aortic valve sclerosis/calcification is present, without any evidence of aortic stenosis.  7. The inferior vena cava IVC not well visualized. Conclusion(s)/Recommendation(s): No prior imagees to compare. FINDINGS  Left Ventricle: Global moderate hypokinesis of the LV. Akinetic anteroseptal/inferoseptal wall from base to the apex. Consistent with prior CABG. Left ventricular ejection fraction, by estimation, is 35 to 40%. The left ventricle has moderately decreased function. The left ventricle demonstrates global hypokinesis. The left ventricular internal cavity size was normal in size. There is no left ventricular hypertrophy. Abnormal (paradoxical) septal motion consistent with post-operative status. Left ventricular diastolic parameters are consistent with Grade I diastolic dysfunction (impaired relaxation). Right Ventricle: IVC not well visualized to estimate RVSP. The right ventricular size is normal. No increase in right ventricular wall thickness. Right ventricular systolic function is normal. Left Atrium: Left atrial size was severely dilated. Right Atrium: Right atrial size was moderately dilated. Pericardium: There is no evidence of pericardial effusion. Mitral Valve: There is moderate calcification of the mitral valve leaflet(s). Mild mitral valve regurgitation. MV peak gradient, 5.3 mmHg. The mean mitral valve gradient is 2.0 mmHg. Tricuspid Valve: The tricuspid valve is grossly normal. Tricuspid valve regurgitation is mild. Aortic Valve: The aortic valve was not well visualized. Aortic valve regurgitation is mild to moderate. Aortic regurgitation PHT measures 263 msec. Aortic valve sclerosis/calcification is present, without any evidence of aortic stenosis. Aortic valve mean gradient measures 7.0 mmHg. Aortic valve peak gradient measures 13.5 mmHg. Aortic valve area, by VTI measures 1.99 cm. Pulmonic Valve: The  pulmonic valve was normal in structure. Pulmonic valve regurgitation is not visualized. Aorta: The aortic root is normal in size and structure. Venous: The inferior  vena cava IVC not well visualized. IAS/Shunts: No atrial level shunt detected by color flow Doppler.  LEFT VENTRICLE PLAX 2D LVIDd:         5.10 cm      Diastology LVIDs:         4.20 cm      LV e' medial:    6.53 cm/s LV PW:         0.80 cm      LV E/e' medial:  15.0 LV IVS:        0.80 cm      LV e' lateral:   10.60 cm/s LVOT diam:     1.90 cm      LV E/e' lateral: 9.3 LV SV:         74 LV SV Index:   46 LVOT Area:     2.84 cm  LV Volumes (MOD) LV vol d, MOD A2C: 108.0 ml LV vol d, MOD A4C: 89.6 ml LV vol s, MOD A2C: 39.9 ml LV vol s, MOD A4C: 54.1 ml LV SV MOD A2C:     68.1 ml LV SV MOD A4C:     89.6 ml LV SV MOD BP:      52.2 ml RIGHT VENTRICLE RV Basal diam:  4.20 cm RV Mid diam:    2.80 cm RV S prime:     12.70 cm/s TAPSE (M-mode): 2.2 cm LEFT ATRIUM              Index        RIGHT ATRIUM           Index LA diam:        4.70 cm  2.90 cm/m   RA Area:     18.30 cm LA Vol (A2C):   85.8 ml  52.96 ml/m  RA Volume:   48.30 ml  29.81 ml/m LA Vol (A4C):   109.0 ml 67.28 ml/m LA Biplane Vol: 99.9 ml  61.67 ml/m  AORTIC VALVE                     PULMONIC VALVE AV Area (Vmax):    1.91 cm      PV Vmax:       1.20 m/s AV Area (Vmean):   1.74 cm      PV Peak grad:  5.8 mmHg AV Area (VTI):     1.99 cm AV Vmax:           184.00 cm/s AV Vmean:          121.000 cm/s AV VTI:            0.373 m AV Peak Grad:      13.5 mmHg AV Mean Grad:      7.0 mmHg LVOT Vmax:         124.00 cm/s LVOT Vmean:        74.100 cm/s LVOT VTI:          0.262 m LVOT/AV VTI ratio: 0.70 AI PHT:            263 msec  AORTA Ao Root diam: 3.20 cm MITRAL VALVE                TRICUSPID VALVE MV Area (PHT): 4.71 cm     TR Peak grad:   38.7 mmHg MV Area VTI:   3.01 cm     TR Vmax:        311.00 cm/s MV Peak grad:  5.3 mmHg  MV Mean grad:  2.0 mmHg     SHUNTS MV Vmax:       1.15 m/s      Systemic VTI:  0.26 m MV Vmean:      68.5 cm/s    Systemic Diam: 1.90 cm MV Decel Time: 161 msec MV E velocity: 98.10 cm/s MV A velocity: 105.00 cm/s MV E/A ratio:  0.93 Phineas Inches Electronically signed by Phineas Inches Signature Date/Time: 03/03/2021/3:14:28 PM    Final    VAS [place] LOWER EXTREMITY VENOUS (DVT)  Result Date: 03/07/2021  Lower Venous DVT Study Patient Name:  Angel Cooper  Date of Exam:   03/07/2021 Medical Rec #: [id number]          Accession #:    [id number] Date of Birth: [birth year]           Patient Gender: M Patient Age:   44 years Exam Location:  [facility] Procedure:      VAS [place] LOWER EXTREMITY VENOUS (DVT) Referring Phys: Nevin Bloodgood SIMPSON --------------------------------------------------------------------------------  Indications: Hypoxia.  Risk Factors: None identified. Limitations: Body habitus. Comparison Study: No prior studies. Performing Technologist: Oliver Hum RVT  Examination Guidelines: A complete evaluation includes B-mode imaging, spectral Doppler, color Doppler, and power Doppler as needed of all accessible portions of each vessel. Bilateral testing is considered an integral part of a complete examination. Limited examinations for reoccurring indications may be performed as noted. The reflux portion of the exam is performed with the patient in reverse Trendelenburg.  +---------+---------------+---------+-----------+----------+--------------+  RIGHT     Compressibility Phasicity Spontaneity Properties Thrombus Aging  +---------+---------------+---------+-----------+----------+--------------+  CFV       Full            Yes       No                                     +---------+---------------+---------+-----------+----------+--------------+  SFJ       Full                                                             +---------+---------------+---------+-----------+----------+--------------+  FV Prox   Full                                                              +---------+---------------+---------+-----------+----------+--------------+  FV Mid    Full                                                             +---------+---------------+---------+-----------+----------+--------------+  FV Distal Full                                                             +---------+---------------+---------+-----------+----------+--------------+  PFV       Full                                                             +---------+---------------+---------+-----------+----------+--------------+  POP       Full            Yes       No                                     +---------+---------------+---------+-----------+----------+--------------+  PTV       Full                                                             +---------+---------------+---------+-----------+----------+--------------+  PERO      Full                                                             +---------+---------------+---------+-----------+----------+--------------+   +---------+---------------+---------+-----------+----------+--------------+  LEFT      Compressibility Phasicity Spontaneity Properties Thrombus Aging  +---------+---------------+---------+-----------+----------+--------------+  CFV       Full            Yes       No                                     +---------+---------------+---------+-----------+----------+--------------+  SFJ       Full                                                             +---------+---------------+---------+-----------+----------+--------------+  FV Prox   Full                                                             +---------+---------------+---------+-----------+----------+--------------+  FV Mid    Full                                                             +---------+---------------+---------+-----------+----------+--------------+  FV Distal Full                                                              +---------+---------------+---------+-----------+----------+--------------+  PFV       Full                                                             +---------+---------------+---------+-----------+----------+--------------+  POP       Full            Yes       No                                     +---------+---------------+---------+-----------+----------+--------------+  PTV       Full                                                             +---------+---------------+---------+-----------+----------+--------------+  PERO      Full                                                             +---------+---------------+---------+-----------+----------+--------------+     Summary: RIGHT: - There is no evidence of deep vein thrombosis in the lower extremity.  - No cystic structure found in the popliteal fossa.  LEFT: - There is no evidence of deep vein thrombosis in the lower extremity.  - No cystic structure found in the popliteal fossa.  *See table(s) above for measurements and observations. Electronically signed by Monica Martinez MD on 03/07/2021 at 6:40:18 PM.    Final       Subjective:  Patient reports cough has improved, dyspnea has improved as well, he still reports lysed weakness and fatigue. Discharge Exam: Vitals:   03/11/21 0755 03/11/21 0800  BP: 126/71 121/72  Pulse: 95   Resp: (!) 26   Temp: 97.7 F (36.5 C)   SpO2: 96%    Vitals:   03/11/21 0334 03/11/21 0500 03/11/21 0755 03/11/21 0800  BP: (!) 116/55  126/71 121/72  Pulse: 75  95   Resp: 20  (!) 26   Temp: 98 F (36.7 C)  97.7 F (36.5 C)   TempSrc: Oral  Oral   SpO2: 96%  96%   Weight:  57.8 kg    Height:        Awake Alert, Oriented X 3, he is extremely frail, deconditioned.   Symmetrical Chest wall movement, diminished air entry at the bases. RRR,No Gallops,Rubs or new Murmurs, No Parasternal Heave +ve B.Sounds, Abd Soft, No tenderness, No rebound - guarding or rigidity. No Cyanosis, Clubbing or edema, No  new Rash or bruise       The results of significant diagnostics from this hospitalization (including imaging, microbiology, ancillary and laboratory) are listed below for reference.     Microbiology: Recent Results (from the past 240 hour(s))  Blood culture (routine x 2)     Status: None   Collection Time: 03/01/21  7:00 PM   Specimen: BLOOD RIGHT ARM  Result Value  Ref Range Status   Specimen Description BLOOD RIGHT ARM  Final   Special Requests   Final    BOTTLES DRAWN AEROBIC AND ANAEROBIC Blood Culture results may not be optimal due to an excessive volume of blood received in culture bottles   Culture   Final    NO GROWTH 5 DAYS Performed at [facility], [place] [address] [place], [place] [address]    Report Status 03/06/2021 FINAL  Final  Blood culture (routine x 2)     Status: None   Collection Time: 03/01/21  7:05 PM   Specimen: BLOOD LEFT ARM  Result Value Ref Range Status   Specimen Description BLOOD LEFT ARM  Final   Special Requests   Final    BOTTLES DRAWN AEROBIC AND ANAEROBIC Blood Culture adequate volume   Culture   Final    NO GROWTH 5 DAYS Performed at [facility], [place] [address], [place], [place] [address]    Report Status 03/06/2021 FINAL  Final  Respiratory (~20 pathogens) panel by PCR     Status: None   Collection Time: 03/01/21  8:11 PM   Specimen: Nasopharyngeal Swab; Respiratory  Result Value Ref Range Status   Adenovirus NOT DETECTED NOT DETECTED Final   Coronavirus 229E NOT DETECTED NOT DETECTED Final    Comment: (NOTE) The Coronavirus on the Respiratory Panel, DOES NOT test for the novel  Coronavirus (2019 nCoV)    Coronavirus HKU1 NOT DETECTED NOT DETECTED Final   Coronavirus NL63 NOT DETECTED NOT DETECTED Final   Coronavirus OC43 NOT DETECTED NOT DETECTED Final   Metapneumovirus NOT DETECTED NOT DETECTED Final   Rhinovirus / Enterovirus NOT DETECTED NOT DETECTED Final   Influenza A NOT DETECTED NOT DETECTED Final   Influenza B  NOT DETECTED NOT DETECTED Final   Parainfluenza Virus 1 NOT DETECTED NOT DETECTED Final   Parainfluenza Virus 2 NOT DETECTED NOT DETECTED Final   Parainfluenza Virus 3 NOT DETECTED NOT DETECTED Final   Parainfluenza Virus 4 NOT DETECTED NOT DETECTED Final   Respiratory Syncytial Virus NOT DETECTED NOT DETECTED Final   Bordetella pertussis NOT DETECTED NOT DETECTED Final   Bordetella Parapertussis NOT DETECTED NOT DETECTED Final   Chlamydophila pneumoniae NOT DETECTED NOT DETECTED Final   Mycoplasma pneumoniae NOT DETECTED NOT DETECTED Final    Comment: Performed at [facility], [place] [address] [place], [place] [address]  Resp Panel by RT-PCR (Flu A&B, Covid) Nasopharyngeal Swab     Status: Abnormal   Collection Time: 03/02/21  3:12 AM   Specimen: Nasopharyngeal Swab; Nasopharyngeal(NP) swabs in vial transport medium  Result Value Ref Range Status   SARS Coronavirus 2 by RT PCR POSITIVE (A) NEGATIVE Final    Comment: (NOTE) SARS-CoV-2 target nucleic acids are DETECTED.  The SARS-CoV-2 RNA is generally detectable in upper respiratory specimens during the acute phase of infection. Positive results are indicative of the presence of the identified virus, but do not rule out bacterial infection or co-infection with other pathogens not detected by the test. Clinical correlation with patient history and other diagnostic information is necessary to determine patient infection status. The expected result is Negative.  Fact Sheet for Patients: EntrepreneurPulse.com.au  Fact Sheet for Healthcare Providers: IncredibleEmployment.be  This test is not yet approved or cleared by the [place] FDA and  has been authorized for detection and/or diagnosis of SARS-CoV-2 by FDA under an Emergency Use Authorization (EUA).  This EUA will remain in effect (meaning this test  can be used) for the duration of  the COVID-19 declaration under Section 564(b)(1) of  the A ct, 21 U.S.C. section 360bbb-3(b)(1), unless the authorization is terminated or revoked sooner.     Influenza A by PCR NEGATIVE NEGATIVE Final   Influenza B by PCR NEGATIVE NEGATIVE Final    Comment: (NOTE) The Xpert Xpress SARS-CoV-2/FLU/RSV plus assay is intended as an aid in the diagnosis of influenza from Nasopharyngeal swab specimens and should not be used as a sole basis for treatment. Nasal washings and aspirates are unacceptable for Xpert Xpress SARS-CoV-2/FLU/RSV testing.  Fact Sheet for Patients: EntrepreneurPulse.com.au  Fact Sheet for Healthcare Providers: IncredibleEmployment.be  This test is not yet approved or cleared by the [place] FDA and has been authorized for detection and/or diagnosis of SARS-CoV-2 by FDA under an Emergency Use Authorization (EUA). This EUA will remain in effect (meaning this test can be used) for the duration of the COVID-19 declaration under Section 564(b)(1) of the Act, 21 U.S.C. section 360bbb-3(b)(1), unless the authorization is terminated or revoked.  Performed at [facility], [place] [address], [place], [place] [address]   Body fluid culture w Gram Stain     Status: None   Collection Time: 03/06/21  3:35 PM   Specimen: Pleural Fluid  Result Value Ref Range Status   Specimen Description FLUID PLEURAL  Final   Special Requests NONE  Final   Gram Stain   Final    RARE WBC PRESENT,BOTH PMN AND MONONUCLEAR NO ORGANISMS SEEN    Culture   Final    NO GROWTH 3 DAYS Performed at [facility], [place] [address] [place], [place] [address]    Report Status 03/10/2021 FINAL  Final     Labs: BNP (last 3 results) Recent Labs    03/06/21 0057  BNP 9,702.6*   Basic Metabolic Panel: Recent Labs  Lab 03/05/21 0049 03/06/21 0057 03/07/21 0407 03/08/21 0308 03/10/21 0902  NA 136 134* 136 137 137  K 4.5 3.9 3.4* 3.5 3.6  CL 102 100 104 102 96*  CO2 21* 19* _0 GLUCOSE  218* 228* 122* 136* 121*  BUN 40* 58* 59* 53* 37*  CREATININE 1.69* 2.14* 1.74* 1.61* 1.32*  CALCIUM 8.4* 8.2* 8.0* 8.0* 8.2*   Liver Function Tests: Recent Labs  Lab 03/06/21 0057  AST 201*  ALT 52*  ALKPHOS 79  BILITOT 0.2*  PROT 6.0*  ALBUMIN 2.6*   No results for input(s): LIPASE, AMYLASE in the last 168 hours. No results for input(s): AMMONIA in the last 168 hours. CBC: Recent Labs  Lab 03/05/21 0049 03/06/21 0057 03/07/21 0407 03/08/21 0308 03/10/21 0902  WBC 23.1* 25.2* 18.1* 18.8* 20.3*  HGB 9.5* 9.2* 8.4* 8.7* 10.6*  HCT 29.5* 29.1* 25.3* 26.2* 33.9*  MCV 90.5 89.3 88.2 87.9 89.9  PLT 492* 518* 370 343 417*   Cardiac Enzymes: No results for input(s): CKTOTAL, CKMB, CKMBINDEX, TROPONINI in the last 168 hours. BNP: Invalid input(s): POCBNP CBG: No results for input(s): GLUCAP in the last 168 hours. D-Dimer No results for input(s): DDIMER in the last 72 hours. Hgb A1c No results for input(s): HGBA1C in the last 72 hours. Lipid Profile No results for input(s): CHOL, HDL, LDLCALC, TRIG, CHOLHDL, LDLDIRECT in the last 72 hours. Thyroid function studies No results for input(s): TSH, T4TOTAL, T3FREE, THYROIDAB in the last 72 hours.  Invalid input(s): FREET3 Anemia work up No results for input(s): VITAMINB12, FOLATE, FERRITIN, TIBC, IRON, RETICCTPCT in the  last 72 hours. Urinalysis    Component Value Date/Time   COLORURINE YELLOW 03/02/2021 0300   APPEARANCEUR CLEAR 03/02/2021 0300   LABSPEC 1.025 03/02/2021 0300   PHURINE 5.5 03/02/2021 0300   GLUCOSEU NEGATIVE 03/02/2021 0300   HGBUR NEGATIVE 03/02/2021 0300   BILIRUBINUR NEGATIVE 03/02/2021 0300   KETONESUR NEGATIVE 03/02/2021 0300   PROTEINUR NEGATIVE 03/02/2021 0300   UROBILINOGEN 0.2 01/28/2013 1204   NITRITE NEGATIVE 03/02/2021 0300   LEUKOCYTESUR NEGATIVE 03/02/2021 0300   Sepsis Labs Invalid input(s): PROCALCITONIN,  WBC,  LACTICIDVEN Microbiology Recent Results (from the past 240 hour(s))   Blood culture (routine x 2)     Status: None   Collection Time: 03/01/21  7:00 PM   Specimen: BLOOD RIGHT ARM  Result Value Ref Range Status   Specimen Description BLOOD RIGHT ARM  Final   Special Requests   Final    BOTTLES DRAWN AEROBIC AND ANAEROBIC Blood Culture results may not be optimal due to an excessive volume of blood received in culture bottles   Culture   Final    NO GROWTH 5 DAYS Performed at [facility], [place] [address] [place], [place] [address]    Report Status 03/06/2021 FINAL  Final  Blood culture (routine x 2)     Status: None   Collection Time: 03/01/21  7:05 PM   Specimen: BLOOD LEFT ARM  Result Value Ref Range Status   Specimen Description BLOOD LEFT ARM  Final   Special Requests   Final    BOTTLES DRAWN AEROBIC AND ANAEROBIC Blood Culture adequate volume   Culture   Final    NO GROWTH 5 DAYS Performed at [facility], [place] [address], [place], [place] [address]    Report Status 03/06/2021 FINAL  Final  Respiratory (~20 pathogens) panel by PCR     Status: None   Collection Time: 03/01/21  8:11 PM   Specimen: Nasopharyngeal Swab; Respiratory  Result Value Ref Range Status   Adenovirus NOT DETECTED NOT DETECTED Final   Coronavirus 229E NOT DETECTED NOT DETECTED Final    Comment: (NOTE) The Coronavirus on the Respiratory Panel, DOES NOT test for the novel  Coronavirus (2019 nCoV)    Coronavirus HKU1 NOT DETECTED NOT DETECTED Final   Coronavirus NL63 NOT DETECTED NOT DETECTED Final   Coronavirus OC43 NOT DETECTED NOT DETECTED Final   Metapneumovirus NOT DETECTED NOT DETECTED Final   Rhinovirus / Enterovirus NOT DETECTED NOT DETECTED Final   Influenza A NOT DETECTED NOT DETECTED Final   Influenza B NOT DETECTED NOT DETECTED Final   Parainfluenza Virus 1 NOT DETECTED NOT DETECTED Final   Parainfluenza Virus 2 NOT DETECTED NOT DETECTED Final   Parainfluenza Virus 3 NOT DETECTED NOT DETECTED Final   Parainfluenza Virus 4 NOT DETECTED NOT  DETECTED Final   Respiratory Syncytial Virus NOT DETECTED NOT DETECTED Final   Bordetella pertussis NOT DETECTED NOT DETECTED Final   Bordetella Parapertussis NOT DETECTED NOT DETECTED Final   Chlamydophila pneumoniae NOT DETECTED NOT DETECTED Final   Mycoplasma pneumoniae NOT DETECTED NOT DETECTED Final    Comment: Performed at [facility], [place] [address], [place], [place] [address]  Resp Panel by RT-PCR (Flu A&B, Covid) Nasopharyngeal Swab     Status: Abnormal   Collection Time: 03/02/21  3:12 AM   Specimen: Nasopharyngeal Swab; Nasopharyngeal(NP) swabs in vial transport medium  Result Value Ref Range Status   SARS Coronavirus 2 by RT PCR POSITIVE (A) NEGATIVE Final  Comment: (NOTE) SARS-CoV-2 target nucleic acids are DETECTED.  The SARS-CoV-2 RNA is generally detectable in upper respiratory specimens during the acute phase of infection. Positive results are indicative of the presence of the identified virus, but do not rule out bacterial infection or co-infection with other pathogens not detected by the test. Clinical correlation with patient history and other diagnostic information is necessary to determine patient infection status. The expected result is Negative.  Fact Sheet for Patients: EntrepreneurPulse.com.au  Fact Sheet for Healthcare Providers: IncredibleEmployment.be  This test is not yet approved or cleared by the [place] FDA and  has been authorized for detection and/or diagnosis of SARS-CoV-2 by FDA under an Emergency Use Authorization (EUA).  This EUA will remain in effect (meaning this test can be used) for the duration of  the COVID-19 declaration under Section 564(b)(1) of the A ct, 21 U.S.C. section 360bbb-3(b)(1), unless the authorization is terminated or revoked sooner.     Influenza A by PCR NEGATIVE NEGATIVE Final   Influenza B by PCR NEGATIVE NEGATIVE Final    Comment: (NOTE) The Xpert Xpress  SARS-CoV-2/FLU/RSV plus assay is intended as an aid in the diagnosis of influenza from Nasopharyngeal swab specimens and should not be used as a sole basis for treatment. Nasal washings and aspirates are unacceptable for Xpert Xpress SARS-CoV-2/FLU/RSV testing.  Fact Sheet for Patients: EntrepreneurPulse.com.au  Fact Sheet for Healthcare Providers: IncredibleEmployment.be  This test is not yet approved or cleared by the [place] FDA and has been authorized for detection and/or diagnosis of SARS-CoV-2 by FDA under an Emergency Use Authorization (EUA). This EUA will remain in effect (meaning this test can be used) for the duration of the COVID-19 declaration under Section 564(b)(1) of the Act, 21 U.S.C. section 360bbb-3(b)(1), unless the authorization is terminated or revoked.  Performed at [facility], [place] [address], [place], [place] [address]   Body fluid culture w Gram Stain     Status: None   Collection Time: 03/06/21  3:35 PM   Specimen: Pleural Fluid  Result Value Ref Range Status   Specimen Description FLUID PLEURAL  Final   Special Requests NONE  Final   Gram Stain   Final    RARE WBC PRESENT,BOTH PMN AND MONONUCLEAR NO ORGANISMS SEEN    Culture   Final    NO GROWTH 3 DAYS Performed at [facility], [place] [address] [place], [place] [address]    Report Status 03/10/2021 FINAL  Final     Time coordinating discharge: Over 30 minutes  SIGNED:   Phillips Climes, MD  [facility] 03/11/2021, 10:52 AM Pager   If 7PM-7AM, please contact night-coverage [email or web address] Password [email or web address]

## 2021-03-12 DIAGNOSIS — I10 Essential (primary) hypertension: Secondary | ICD-10-CM | POA: Diagnosis not present

## 2021-03-12 DIAGNOSIS — M109 Gout, unspecified: Secondary | ICD-10-CM | POA: Diagnosis not present

## 2021-03-12 DIAGNOSIS — I25810 Atherosclerosis of coronary artery bypass graft(s) without angina pectoris: Secondary | ICD-10-CM | POA: Diagnosis not present

## 2021-03-12 DIAGNOSIS — J189 Pneumonia, unspecified organism: Secondary | ICD-10-CM | POA: Diagnosis not present

## 2021-03-12 DIAGNOSIS — I4891 Unspecified atrial fibrillation: Secondary | ICD-10-CM | POA: Diagnosis not present

## 2021-03-12 DIAGNOSIS — K5900 Constipation, unspecified: Secondary | ICD-10-CM | POA: Diagnosis not present

## 2021-03-12 DIAGNOSIS — I5040 Unspecified combined systolic (congestive) and diastolic (congestive) heart failure: Secondary | ICD-10-CM | POA: Diagnosis not present

## 2021-03-12 DIAGNOSIS — K5669 Other partial intestinal obstruction: Secondary | ICD-10-CM | POA: Diagnosis not present

## 2021-03-12 DIAGNOSIS — K219 Gastro-esophageal reflux disease without esophagitis: Secondary | ICD-10-CM | POA: Diagnosis not present

## 2021-03-15 DIAGNOSIS — I4891 Unspecified atrial fibrillation: Secondary | ICD-10-CM | POA: Diagnosis not present

## 2021-03-15 DIAGNOSIS — I5040 Unspecified combined systolic (congestive) and diastolic (congestive) heart failure: Secondary | ICD-10-CM | POA: Diagnosis not present

## 2021-03-15 DIAGNOSIS — E785 Hyperlipidemia, unspecified: Secondary | ICD-10-CM | POA: Diagnosis not present

## 2021-03-15 DIAGNOSIS — R6884 Jaw pain: Secondary | ICD-10-CM | POA: Diagnosis not present

## 2021-03-15 DIAGNOSIS — J9601 Acute respiratory failure with hypoxia: Secondary | ICD-10-CM | POA: Diagnosis not present

## 2021-03-15 DIAGNOSIS — I10 Essential (primary) hypertension: Secondary | ICD-10-CM | POA: Diagnosis not present

## 2021-03-15 DIAGNOSIS — J189 Pneumonia, unspecified organism: Secondary | ICD-10-CM | POA: Diagnosis not present

## 2021-03-15 DIAGNOSIS — N1832 Chronic kidney disease, stage 3b: Secondary | ICD-10-CM | POA: Diagnosis not present

## 2021-03-15 DIAGNOSIS — D649 Anemia, unspecified: Secondary | ICD-10-CM | POA: Diagnosis not present

## 2021-03-15 DIAGNOSIS — I509 Heart failure, unspecified: Secondary | ICD-10-CM | POA: Diagnosis not present

## 2021-03-15 DIAGNOSIS — D72829 Elevated white blood cell count, unspecified: Secondary | ICD-10-CM | POA: Diagnosis not present

## 2021-03-18 DIAGNOSIS — I5040 Unspecified combined systolic (congestive) and diastolic (congestive) heart failure: Secondary | ICD-10-CM | POA: Diagnosis not present

## 2021-03-18 DIAGNOSIS — M1990 Unspecified osteoarthritis, unspecified site: Secondary | ICD-10-CM | POA: Diagnosis not present

## 2021-03-18 DIAGNOSIS — J189 Pneumonia, unspecified organism: Secondary | ICD-10-CM | POA: Diagnosis not present

## 2021-03-18 DIAGNOSIS — I4891 Unspecified atrial fibrillation: Secondary | ICD-10-CM | POA: Diagnosis not present

## 2021-03-18 DIAGNOSIS — G479 Sleep disorder, unspecified: Secondary | ICD-10-CM | POA: Diagnosis not present

## 2021-03-18 DIAGNOSIS — I10 Essential (primary) hypertension: Secondary | ICD-10-CM | POA: Diagnosis not present

## 2021-03-20 DIAGNOSIS — I10 Essential (primary) hypertension: Secondary | ICD-10-CM | POA: Diagnosis not present

## 2021-03-20 DIAGNOSIS — I5040 Unspecified combined systolic (congestive) and diastolic (congestive) heart failure: Secondary | ICD-10-CM | POA: Diagnosis not present

## 2021-03-20 DIAGNOSIS — N1832 Chronic kidney disease, stage 3b: Secondary | ICD-10-CM | POA: Diagnosis not present

## 2021-03-20 DIAGNOSIS — J189 Pneumonia, unspecified organism: Secondary | ICD-10-CM | POA: Diagnosis not present

## 2021-03-20 DIAGNOSIS — I4891 Unspecified atrial fibrillation: Secondary | ICD-10-CM | POA: Diagnosis not present

## 2021-03-20 DIAGNOSIS — M109 Gout, unspecified: Secondary | ICD-10-CM | POA: Diagnosis not present

## 2021-03-21 DIAGNOSIS — I4891 Unspecified atrial fibrillation: Secondary | ICD-10-CM | POA: Diagnosis not present

## 2021-03-21 DIAGNOSIS — L89610 Pressure ulcer of right heel, unstageable: Secondary | ICD-10-CM | POA: Diagnosis not present

## 2021-03-21 DIAGNOSIS — J189 Pneumonia, unspecified organism: Secondary | ICD-10-CM | POA: Diagnosis not present

## 2021-03-21 DIAGNOSIS — G479 Sleep disorder, unspecified: Secondary | ICD-10-CM | POA: Diagnosis not present

## 2021-03-21 DIAGNOSIS — I5040 Unspecified combined systolic (congestive) and diastolic (congestive) heart failure: Secondary | ICD-10-CM | POA: Diagnosis not present

## 2021-03-21 DIAGNOSIS — L89619 Pressure ulcer of right heel, unspecified stage: Secondary | ICD-10-CM | POA: Diagnosis not present

## 2021-03-21 DIAGNOSIS — M109 Gout, unspecified: Secondary | ICD-10-CM | POA: Diagnosis not present

## 2021-03-21 DIAGNOSIS — I10 Essential (primary) hypertension: Secondary | ICD-10-CM | POA: Diagnosis not present

## 2021-03-28 DIAGNOSIS — L89610 Pressure ulcer of right heel, unstageable: Secondary | ICD-10-CM | POA: Diagnosis not present

## 2021-03-29 DIAGNOSIS — I10 Essential (primary) hypertension: Secondary | ICD-10-CM | POA: Diagnosis not present

## 2021-03-29 DIAGNOSIS — M109 Gout, unspecified: Secondary | ICD-10-CM | POA: Diagnosis not present

## 2021-03-29 DIAGNOSIS — I5040 Unspecified combined systolic (congestive) and diastolic (congestive) heart failure: Secondary | ICD-10-CM | POA: Diagnosis not present

## 2021-03-29 DIAGNOSIS — J189 Pneumonia, unspecified organism: Secondary | ICD-10-CM | POA: Diagnosis not present

## 2021-03-29 DIAGNOSIS — I4891 Unspecified atrial fibrillation: Secondary | ICD-10-CM | POA: Diagnosis not present

## 2021-03-29 DIAGNOSIS — L89619 Pressure ulcer of right heel, unspecified stage: Secondary | ICD-10-CM | POA: Diagnosis not present

## 2021-04-01 ENCOUNTER — Ambulatory Visit
Admission: RE | Admit: 2021-04-01 | Discharge: 2021-04-01 | Disposition: A | Discharge status: Home / Self Care | Payer: Medicare HMO | Source: Ambulatory Visit | Attending: Internal Medicine | Admitting: Internal Medicine

## 2021-04-01 ENCOUNTER — Other Ambulatory Visit: Payer: Self-pay | Admitting: Internal Medicine

## 2021-04-01 DIAGNOSIS — R918 Other nonspecific abnormal finding of lung field: Secondary | ICD-10-CM | POA: Diagnosis not present

## 2021-04-01 DIAGNOSIS — I5042 Chronic combined systolic (congestive) and diastolic (congestive) heart failure: Secondary | ICD-10-CM | POA: Diagnosis not present

## 2021-04-01 DIAGNOSIS — J9601 Acute respiratory failure with hypoxia: Secondary | ICD-10-CM

## 2021-04-01 DIAGNOSIS — I517 Cardiomegaly: Secondary | ICD-10-CM | POA: Diagnosis not present

## 2021-04-01 DIAGNOSIS — K56600 Partial intestinal obstruction, unspecified as to cause: Secondary | ICD-10-CM | POA: Diagnosis not present

## 2021-04-01 DIAGNOSIS — I4891 Unspecified atrial fibrillation: Secondary | ICD-10-CM | POA: Diagnosis not present

## 2021-04-01 DIAGNOSIS — M419 Scoliosis, unspecified: Secondary | ICD-10-CM | POA: Diagnosis not present

## 2021-04-01 DIAGNOSIS — J9600 Acute respiratory failure, unspecified whether with hypoxia or hypercapnia: Secondary | ICD-10-CM | POA: Diagnosis not present

## 2021-04-04 ENCOUNTER — Other Ambulatory Visit: Payer: Self-pay | Admitting: Internal Medicine

## 2021-04-04 DIAGNOSIS — R9389 Abnormal findings on diagnostic imaging of other specified body structures: Secondary | ICD-10-CM

## 2021-04-10 DIAGNOSIS — N1832 Chronic kidney disease, stage 3b: Secondary | ICD-10-CM | POA: Diagnosis not present

## 2021-04-10 DIAGNOSIS — M25569 Pain in unspecified knee: Secondary | ICD-10-CM | POA: Diagnosis not present

## 2021-04-10 DIAGNOSIS — Z9981 Dependence on supplemental oxygen: Secondary | ICD-10-CM | POA: Diagnosis not present

## 2021-04-10 DIAGNOSIS — I5042 Chronic combined systolic (congestive) and diastolic (congestive) heart failure: Secondary | ICD-10-CM | POA: Diagnosis not present

## 2021-04-18 ENCOUNTER — Ambulatory Visit: Payer: Medicare HMO | Admitting: Cardiovascular Disease

## 2021-04-18 ENCOUNTER — Encounter: Payer: Self-pay | Admitting: Cardiovascular Disease

## 2021-04-18 ENCOUNTER — Other Ambulatory Visit: Payer: Self-pay

## 2021-04-18 VITALS — BP 130/60 | HR 96 | Ht 67.0 in | Wt 113.6 lb

## 2021-04-18 DIAGNOSIS — I25810 Atherosclerosis of coronary artery bypass graft(s) without angina pectoris: Secondary | ICD-10-CM

## 2021-04-18 DIAGNOSIS — I4891 Unspecified atrial fibrillation: Secondary | ICD-10-CM | POA: Diagnosis not present

## 2021-04-18 DIAGNOSIS — I519 Heart disease, unspecified: Secondary | ICD-10-CM | POA: Diagnosis not present

## 2021-04-18 DIAGNOSIS — I10 Essential (primary) hypertension: Secondary | ICD-10-CM

## 2021-04-18 MED ORDER — DILTIAZEM HCL ER COATED BEADS 120 MG PO CP24: 120.0000 mg | ORAL_CAPSULE | Freq: Every day | ORAL | 3 refills | Status: DC

## 2021-04-18 NOTE — Assessment & Plan Note (Signed)
History of CAD status post CABG in 1999 by Dr. Darcey Nora.  He has been asymptomatic from a heart point of view since. ?

## 2021-04-18 NOTE — Assessment & Plan Note (Signed)
Recent hospitalization in early February for COVID-pneumonia and heart failure.  A 2D echo performed while in the hospital on 03/03/2021 revealed EF of 35 to 40% with grade 1 diastolic dysfunction and no valvular abnormalities. ?

## 2021-04-18 NOTE — Patient Instructions (Signed)
Medication Instructions:  ? ?-Stop cardizem SR. ? ?-Start cardizem CD '120mg'$  once daily. ? ?*If you need a refill on your cardiac medications before your next appointment, please call your pharmacy* ? ? ?Follow-Up: ?At [facility], you and your health needs are our priority.  As part of our continuing mission to provide you with exceptional heart care, we have created designated Provider Care Teams.  These Care Teams include your primary Cardiologist (physician) and Advanced Practice Providers (APPs -  Physician Assistants and Nurse Practitioners) who all work together to provide you with the care you need, when you need it. ? ?We recommend signing up for the patient portal called "MyChart".  Sign up information is provided on this After Visit Summary.  MyChart is used to connect with patients for Virtual Visits (Telemedicine).  Patients are able to view lab/test results, encounter notes, upcoming appointments, etc.  Non-urgent messages can be sent to your provider as well.   ?To learn more about what you can do with MyChart, go to [email or web address].   ? ?Your next appointment:   ?6 month(s) ? ?The format for your next appointment:   ?In Person ? ?Provider:   ?Coletta Memos, FNP, Fabian Sharp, PA-C, Sande Rives, PA-C, Caron Presume, PA-C, Jory Sims, DNP, ANP, or Almyra Deforest, PA-C     ? ? ?Then, Quay Burow, MD will plan to see you again in 12 month(s). ?

## 2021-04-18 NOTE — Assessment & Plan Note (Signed)
History of essential hypertension blood pressure measured today at 130/60.  He is on diltiazem. ?

## 2021-04-18 NOTE — Assessment & Plan Note (Signed)
History of persistent atrial fibrillation on low-dose Eliquis and Cardizem twice daily.  Will consolidate to CD120 daily. ?

## 2021-04-18 NOTE — Progress Notes (Signed)
? ? ? ?04/18/2021 ?Angel Cooper   ?Dec 11, 1930  ?[id number] ? ?Primary Physician [name], MD ?Primary Cardiologist: Lorretta Harp MD Angel Cooper, [place] ? ?HPI:  Angel Cooper is a 88 y.o. thin, frail and chronically ill-appearing married Caucasian male, father of 2 and grandfather of 4 grandchildren who was referred back to me by his PCP, Dr. Koleen Nimrod, to be reestablished.  He is a retired Arboriculturist for [employer].  His cardiac risk factors are notable for treated hypertension hyperlipidemia.  He never smoked.  He had CABG by Dr. Darcey Nora in 1999 and apparently I was his cardiologist at that time but have not seen him since.  He has persistent A-fib currently on Eliquis.  He was hospitalized from 03/01/2021 for 10 days and then was in rehab facility for 20 days thereafter with COVID-pneumonia, hypoxic respiratory failure, acute on chronic combined systolic and diastolic heart failure, AKI.  He lives with his wife.  He denies chest pain or shortness of breath. ? ? ?Current Meds  ?Medication Sig  ? acetaminophen (TYLENOL) 500 MG tablet Take 1 tablet (500 mg total) by mouth every 4 (four) hours as needed for mild pain or headache.  ? allopurinol (ZYLOPRIM) 100 MG tablet Take 0.5 tablets (50 mg total) by mouth daily.  ? apixaban (ELIQUIS) 2.5 MG TABS tablet Take 1 tablet (2.5 mg total) by mouth 2 (two) times daily.  ? Cyanocobalamin (B-12 COMPLIANCE INJECTION) 1000 MCG/ML KIT Inject 1,000 mcg as directed every 30 (thirty) days.  ? diltiazem (CARDIZEM CD) 120 MG 24 hr capsule Take 1 capsule (120 mg total) by mouth daily.  ? feeding supplement (ENSURE ENLIVE / ENSURE PLUS) LIQD Take 237 mLs by mouth 3 (three) times daily between meals.  ? furosemide (LASIX) 40 MG tablet Take 1.5 tablets (60 mg total) by mouth 2 (two) times daily. (Patient taking differently: Take 60 mg by mouth daily.)  ? HYDROcodone-acetaminophen (NORCO/VICODIN) 5-325 MG tablet Take 1 tablet by mouth every 6 (six) hours as  needed for severe pain.  ? multivitamin (RENA-VIT) TABS tablet Take 1 tablet by mouth at bedtime.  ? pantoprazole (PROTONIX) 40 MG tablet Take 1 tablet (40 mg total) by mouth daily at 12 noon.  ? polyethylene glycol (MIRALAX / GLYCOLAX) 17 g packet Take 17 g by mouth daily as needed.  ? sennosides-docusate sodium (SENOKOT-S) 8.6-50 MG tablet Take 2 tablets by mouth daily.  ? [DISCONTINUED] diltiazem (CARDIZEM SR) 60 MG 12 hr capsule Take 1 capsule (60 mg total) by mouth every 12 (twelve) hours.  ?  ? ?Allergies  ?Allergen Reactions  ? Aspirin Other (See Comments)  ?  Stomach pain   ? Chlorhexidine Other (See Comments)  ?  CAUSED SKIN IRRITATION AND BURNING SENSATION  ? Enalapril Other (See Comments)  ?  Cannot take due to kidney compromise  ? Gabapentin Other (See Comments)  ?  Caused nightmares  ? Statins Other (See Comments)  ?  Myalgia  ? ? ?Social History  ? ?Socioeconomic History  ? Marital status: Married  ?  Spouse name: Not on file  ? Number of children: Not on file  ? Years of education: Not on file  ? Highest education level: Not on file  ?Occupational History  ? Not on file  ?Tobacco Use  ? Smoking status: Former  ?  Types: Cigarettes  ?  Quit date: 01/27/1958  ?  Years since quitting: 63.2  ? Smokeless tobacco: Never  ?Substance and Sexual Activity  ?  Alcohol use: Not Currently  ?  Comment: OCCASIONAL  ? Drug use: No  ? Sexual activity: Not Currently  ?Other Topics Concern  ? Not on file  ?Social History Narrative  ? Not on file  ? ?Social Determinants of Health  ? ?Financial Resource Strain: Not on file  ?Food Insecurity: Not on file  ?Transportation Needs: Not on file  ?Physical Activity: Not on file  ?Stress: Not on file  ?Social Connections: Not on file  ?Intimate Partner Violence: Not on file  ?  ? ?Review of Systems: ?General: negative for chills, fever, night sweats or weight changes.  ?Cardiovascular: negative for chest pain, dyspnea on exertion, edema, orthopnea, palpitations, paroxysmal nocturnal  dyspnea or shortness of breath ?Dermatological: negative for rash ?Respiratory: negative for cough or wheezing ?Urologic: negative for hematuria ?Abdominal: negative for nausea, vomiting, diarrhea, bright red blood per rectum, melena, or hematemesis ?Neurologic: negative for visual changes, syncope, or dizziness ?All other systems reviewed and are otherwise negative except as noted above. ? ? ? ?Blood pressure 130/60, pulse 96, height 5' 7"  (1.702 m), weight 113 lb 9.6 oz (51.5 kg), SpO2 97 %.  ?General appearance: alert and no distress ?Neck: no adenopathy, no carotid bruit, no JVD, supple, symmetrical, trachea midline, and thyroid not enlarged, symmetric, no tenderness/mass/nodules ?Lungs: clear to auscultation bilaterally ?Heart: irregularly irregular rhythm ?Extremities: extremities normal, atraumatic, no cyanosis or edema ?Pulses: 2+ and symmetric ?Skin: Skin color, texture, turgor normal. No rashes or lesions ?Neurologic: Grossly normal ? ?EKG atrial fibrillation with controlled ventricular response and poor R wave progression.  I personally reviewed this EKG. ? ?ASSESSMENT AND PLAN:  ? ?Atrial fibrillation with RVR ([place]) ?History of persistent atrial fibrillation on low-dose Eliquis and Cardizem twice daily.  Will consolidate to CD120 daily. ? ?CAD (coronary artery disease) of artery bypass graft ?History of CAD status post CABG in 1999 by Dr. Darcey Nora.  He has been asymptomatic from a heart point of view since. ? ?Essential hypertension ?History of essential hypertension blood pressure measured today at 130/60.  He is on diltiazem. ? ?Left ventricular dysfunction ?Recent hospitalization in early February for COVID-pneumonia and heart failure.  A 2D echo performed while in the hospital on 03/03/2021 revealed EF of 35 to 40% with grade 1 diastolic dysfunction and no valvular abnormalities. ? ? ? ? ?Lorretta Harp MD FACP,FACC,FAHA, FSCAI ?04/18/2021 ?3:23 PM ?

## 2021-04-26 ENCOUNTER — Other Ambulatory Visit: Payer: Medicare HMO

## 2021-04-26 DIAGNOSIS — E7800 Pure hypercholesterolemia, unspecified: Secondary | ICD-10-CM | POA: Diagnosis not present

## 2021-04-26 DIAGNOSIS — N1832 Chronic kidney disease, stage 3b: Secondary | ICD-10-CM | POA: Diagnosis not present

## 2021-04-26 DIAGNOSIS — I4891 Unspecified atrial fibrillation: Secondary | ICD-10-CM | POA: Diagnosis not present

## 2021-04-26 DIAGNOSIS — I10 Essential (primary) hypertension: Secondary | ICD-10-CM | POA: Diagnosis not present

## 2021-05-08 DIAGNOSIS — M170 Bilateral primary osteoarthritis of knee: Secondary | ICD-10-CM | POA: Diagnosis not present

## 2021-05-08 DIAGNOSIS — I4891 Unspecified atrial fibrillation: Secondary | ICD-10-CM | POA: Diagnosis not present

## 2021-05-08 DIAGNOSIS — R634 Abnormal weight loss: Secondary | ICD-10-CM | POA: Diagnosis not present

## 2021-05-13 ENCOUNTER — Other Ambulatory Visit: Payer: Self-pay | Admitting: Cardiovascular Disease

## 2021-05-14 ENCOUNTER — Telehealth: Payer: Self-pay | Admitting: Cardiovascular Disease

## 2021-05-14 NOTE — Telephone Encounter (Signed)
?*  STAT* If patient is at the pharmacy, call can be transferred to refill team. ? ? ?1. Which medications need to be refilled? (please list name of each medication and dose if known) diltiazem (CARDIZEM CD) 120 MG 24 hr capsule ? ?2. Which pharmacy/location (including street and city if local pharmacy) is medication to be sent to?  ?[place], [place] - [address] ?3. Do they need a 30 day or 90 day supply? 90 day ? ?

## 2021-05-16 NOTE — Telephone Encounter (Signed)
Called patient to advise refill sent to pharmacy. °

## 2021-06-12 ENCOUNTER — Other Ambulatory Visit: Payer: Self-pay | Admitting: Cardiovascular Disease

## 2021-06-13 NOTE — Telephone Encounter (Signed)
Prescription refill request for Eliquis received. Indication:Afib Last office visit:3/23 Scr:1.3 Age: 88 Weight:51.5 kg  Prescription refilled

## 2021-07-10 ENCOUNTER — Other Ambulatory Visit: Payer: Self-pay | Admitting: Cardiovascular Disease

## 2021-07-10 DIAGNOSIS — I5042 Chronic combined systolic (congestive) and diastolic (congestive) heart failure: Secondary | ICD-10-CM | POA: Diagnosis not present

## 2021-07-10 DIAGNOSIS — I10 Essential (primary) hypertension: Secondary | ICD-10-CM | POA: Diagnosis not present

## 2021-07-10 DIAGNOSIS — N183 Chronic kidney disease, stage 3 unspecified: Secondary | ICD-10-CM | POA: Diagnosis not present

## 2021-07-10 DIAGNOSIS — E7800 Pure hypercholesterolemia, unspecified: Secondary | ICD-10-CM | POA: Diagnosis not present

## 2021-07-10 DIAGNOSIS — K219 Gastro-esophageal reflux disease without esophagitis: Secondary | ICD-10-CM | POA: Diagnosis not present

## 2021-07-10 DIAGNOSIS — I4891 Unspecified atrial fibrillation: Secondary | ICD-10-CM | POA: Diagnosis not present

## 2021-08-02 ENCOUNTER — Other Ambulatory Visit: Payer: Self-pay | Admitting: Cardiovascular Disease

## 2021-08-14 ENCOUNTER — Other Ambulatory Visit: Payer: Self-pay | Admitting: Cardiovascular Disease

## 2021-08-27 DIAGNOSIS — R1030 Lower abdominal pain, unspecified: Secondary | ICD-10-CM | POA: Diagnosis not present

## 2021-08-27 DIAGNOSIS — R630 Anorexia: Secondary | ICD-10-CM | POA: Diagnosis not present

## 2021-09-12 ENCOUNTER — Other Ambulatory Visit: Payer: Self-pay | Admitting: Cardiovascular Disease

## 2021-09-25 ENCOUNTER — Other Ambulatory Visit: Payer: Self-pay | Admitting: Cardiovascular Disease

## 2021-10-15 DIAGNOSIS — N1832 Chronic kidney disease, stage 3b: Secondary | ICD-10-CM | POA: Diagnosis not present

## 2021-10-15 DIAGNOSIS — I4891 Unspecified atrial fibrillation: Secondary | ICD-10-CM | POA: Diagnosis not present

## 2021-10-15 DIAGNOSIS — K219 Gastro-esophageal reflux disease without esophagitis: Secondary | ICD-10-CM | POA: Diagnosis not present

## 2021-10-15 DIAGNOSIS — I5042 Chronic combined systolic (congestive) and diastolic (congestive) heart failure: Secondary | ICD-10-CM | POA: Diagnosis not present

## 2021-10-15 DIAGNOSIS — E7800 Pure hypercholesterolemia, unspecified: Secondary | ICD-10-CM | POA: Diagnosis not present

## 2021-10-15 DIAGNOSIS — F329 Major depressive disorder, single episode, unspecified: Secondary | ICD-10-CM | POA: Diagnosis not present

## 2021-10-15 DIAGNOSIS — I10 Essential (primary) hypertension: Secondary | ICD-10-CM | POA: Diagnosis not present

## 2021-10-17 DIAGNOSIS — D510 Vitamin B12 deficiency anemia due to intrinsic factor deficiency: Secondary | ICD-10-CM | POA: Diagnosis not present

## 2021-10-17 DIAGNOSIS — R6889 Other general symptoms and signs: Secondary | ICD-10-CM | POA: Diagnosis not present

## 2022-01-31 DIAGNOSIS — E7800 Pure hypercholesterolemia, unspecified: Secondary | ICD-10-CM | POA: Diagnosis not present

## 2022-01-31 DIAGNOSIS — K219 Gastro-esophageal reflux disease without esophagitis: Secondary | ICD-10-CM | POA: Diagnosis not present

## 2022-01-31 DIAGNOSIS — I4891 Unspecified atrial fibrillation: Secondary | ICD-10-CM | POA: Diagnosis not present

## 2022-01-31 DIAGNOSIS — M170 Bilateral primary osteoarthritis of knee: Secondary | ICD-10-CM | POA: Diagnosis not present

## 2022-01-31 DIAGNOSIS — D510 Vitamin B12 deficiency anemia due to intrinsic factor deficiency: Secondary | ICD-10-CM | POA: Diagnosis not present

## 2022-01-31 DIAGNOSIS — N1832 Chronic kidney disease, stage 3b: Secondary | ICD-10-CM | POA: Diagnosis not present

## 2022-01-31 DIAGNOSIS — I5042 Chronic combined systolic (congestive) and diastolic (congestive) heart failure: Secondary | ICD-10-CM | POA: Diagnosis not present

## 2022-01-31 DIAGNOSIS — I25810 Atherosclerosis of coronary artery bypass graft(s) without angina pectoris: Secondary | ICD-10-CM | POA: Diagnosis not present

## 2022-01-31 DIAGNOSIS — Z23 Encounter for immunization: Secondary | ICD-10-CM | POA: Diagnosis not present

## 2022-02-05 DIAGNOSIS — R61 Generalized hyperhidrosis: Secondary | ICD-10-CM | POA: Diagnosis not present

## 2022-02-05 DIAGNOSIS — I5042 Chronic combined systolic (congestive) and diastolic (congestive) heart failure: Secondary | ICD-10-CM | POA: Diagnosis not present

## 2022-02-05 DIAGNOSIS — D510 Vitamin B12 deficiency anemia due to intrinsic factor deficiency: Secondary | ICD-10-CM | POA: Diagnosis not present

## 2022-04-24 ENCOUNTER — Other Ambulatory Visit: Payer: Self-pay | Admitting: Cardiovascular Disease

## 2022-05-26 ENCOUNTER — Other Ambulatory Visit: Payer: Self-pay | Admitting: Cardiovascular Disease

## 2022-06-09 ENCOUNTER — Telehealth: Payer: Self-pay | Admitting: Cardiovascular Disease

## 2022-06-09 ENCOUNTER — Other Ambulatory Visit: Payer: Self-pay

## 2022-06-09 MED ORDER — DILTIAZEM HCL ER 60 MG PO CP12: 60.0000 mg | ORAL_CAPSULE | Freq: Two times a day (BID) | ORAL | 1 refills | Status: DC

## 2022-06-09 NOTE — Telephone Encounter (Signed)
*  STAT* If patient is at the pharmacy, call can be transferred to refill team.   1. Which medications need to be refilled? (please list name of each medication and dose if known) diltiazem (CARDIZEM SR) 60 MG 12 hr capsule   2. Which pharmacy/location (including street and city if local pharmacy) is medication to be sent to? [facility] - [place], [place] - [address]   3. Do they need a 30 day or 90 day supply? 90   Patient has appt on 5/24

## 2022-06-09 NOTE — Telephone Encounter (Signed)
Called patient to advise refill sent to pharmacy. °

## 2022-06-19 NOTE — Progress Notes (Signed)
Cardiology Clinic Note   Date: 06/20/2022 ID: LEBRON HELFERICH, DOB December 11, 1930, MRN [id number]  Primary Cardiologist:  Nanetta Batty, MD  Patient Profile    Angel Cooper is a 89 y.o. male who presents to the clinic today for 1 year follow up.   Past medical history significant for: CAD. LHC 07/05/1999 (non-Q wave MI): Severe three-vessel CAD with high-grade proximal and mid LAD disease, subtotal occlusion of ramus, significant OM1 and mid Cx stenosis, and total occlusion of RCA.  CTS consult. S/p CABG. Chronic combined systolic and diastolic heart failure. Echo 03/03/2021: EF 35 to 40%.  Global hypokinesis.  Grade I DD.  Normal RV function.  Severe LAE.  Moderate RAE.  Mild MR.  Mild to moderate AI.  Aortic valve sclerosis/calcification without stenosis. PAF. Hypertension. Hyperlipidemia. CKD stage IIIb.   History of Present Illness    [facility] DOCHERTY has remote history of MI and CABG.  He was seen by Dr. Allyson Sabal on 04/18/2021 to establish care as requested by Dr. Orson Aloe.  He had no cardiac complaints at that time.  Today, patient is accompanied by his son. He denies shortness of breath or dyspnea on exertion. No chest pain, pressure, or tightness. Denies lower extremity edema, orthopnea, or PND. No palpitations. He has not taken Eliquis since getting out of rehab in March 2023. Discussed stroke risk with patient and son. Patient states he understands the risks but does not want to take the medication. He states he is happy with his current medication regimen and is not interested in making any changes.    ROS: All other systems reviewed and are otherwise negative except as noted in History of Present Illness.  Studies Reviewed    ECG personally reviewed by me today: NSR, 67 bpm. Previously in afib 04/18/2021.  Risk Assessment/Calculations     CHA2DS2-VASc Score = 5   This indicates a 7.2% annual risk of stroke. The patient's score is based upon: CHF History: 1 HTN  History: 1 Diabetes History: 0 Stroke History: 0 Vascular Disease History: 1 Age Score: 2 Gender Score: 0             Physical Exam    VS:  BP (!) 152/59   Pulse 67   Ht 5\' 7"  (1.702 m)   Wt 119 lb (54 kg)   BMI 18.64 kg/m  , BMI Body mass index is 18.64 kg/m.  GEN: Well nourished, well developed, in no acute distress. Neck: No JVD or carotid bruits. Cardiac: RRR. No murmurs. No rubs or gallops.   Respiratory:  Respirations regular and unlabored. Clear to auscultation without rales, wheezing or rhonchi. GI: Soft, nontender, nondistended. Extremities: Radials/DP/PT 2+ and equal bilaterally. No clubbing or cyanosis. No edema.  Skin: Warm and dry, no rash. Neuro: Strength intact.  Assessment & Plan    CAD.  S/p CABG ~1999.  Patient denies chest pain, pressure, tightness. EKG shows NSR.  PAF.  Patient denies palpitations. He stopped Eliquis after getting out of rehab in March 2023. He is not interested in restarting it. Discussed stroke risk and patient voiced understanding. EKG shows NSR, 67 bpm. Continue Cardizem.   Chronic combined systolic and diastolic heart failure.  Echo February 2023 with EF 35 to 40%, Grade I DD, BAE, mild to moderate AI.  Patient denies lower extremity edema, DOE, orthopnea or PND. Euvolemic and well compensated on exam.  Continue furosemide. Hypertension. BP today 152/59 on intake and 120/60 on recheck.  Patient denies headaches, dizziness or  vision changes. Continue Cardizem.  Disposition: Return in 1 year or sooner as needed.          Signed, Etta Grandchild. Dazha Kempa, DNP, NP-C

## 2022-06-20 ENCOUNTER — Ambulatory Visit: Payer: Medicare HMO | Attending: Student | Admitting: Student

## 2022-06-20 ENCOUNTER — Encounter: Payer: Self-pay | Admitting: Student

## 2022-06-20 VITALS — BP 120/60 | HR 67 | Ht 67.0 in | Wt 119.0 lb

## 2022-06-20 DIAGNOSIS — I25810 Atherosclerosis of coronary artery bypass graft(s) without angina pectoris: Secondary | ICD-10-CM

## 2022-06-20 DIAGNOSIS — I5042 Chronic combined systolic (congestive) and diastolic (congestive) heart failure: Secondary | ICD-10-CM | POA: Diagnosis not present

## 2022-06-20 DIAGNOSIS — I10 Essential (primary) hypertension: Secondary | ICD-10-CM | POA: Diagnosis not present

## 2022-06-20 DIAGNOSIS — I480 Paroxysmal atrial fibrillation: Secondary | ICD-10-CM | POA: Diagnosis not present

## 2022-06-20 NOTE — Patient Instructions (Signed)
Medication Instructions:  Your physician recommends that you continue on your current medications as directed. Please refer to the Current Medication list given to you today.  *If you need a refill on your cardiac medications before your next appointment, please call your pharmacy*   Lab Work: NONE If you have labs (blood work) drawn today and your tests are completely normal, you will receive your results only by: MyChart Message (if you have MyChart) OR A paper copy in the mail If you have any lab test that is abnormal or we need to change your treatment, we will call you to review the results.   Testing/Procedures: NONE   Follow-Up: At  [facility], you and your health needs are our priority.  As part of our continuing mission to provide you with exceptional heart care, we have created designated Provider Care Teams.  These Care Teams include your primary Cardiologist (physician) and Advanced Practice Providers (APPs -  Physician Assistants and Nurse Practitioners) who all work together to provide you with the care you need, when you need it.  We recommend signing up for the patient portal called "MyChart".  Sign up information is provided on this After Visit Summary.  MyChart is used to connect with patients for Virtual Visits (Telemedicine).  Patients are able to view lab/test results, encounter notes, upcoming appointments, etc.  Non-urgent messages can be sent to your provider as well.   To learn more about what you can do with MyChart, go to [email or web address].    Your next appointment:   1 year(s)  Provider:   Jonathan Berry, MD    

## 2022-07-15 DIAGNOSIS — E790 Hyperuricemia without signs of inflammatory arthritis and tophaceous disease: Secondary | ICD-10-CM | POA: Diagnosis not present

## 2022-07-15 DIAGNOSIS — I10 Essential (primary) hypertension: Secondary | ICD-10-CM | POA: Diagnosis not present

## 2022-07-15 DIAGNOSIS — H3530 Unspecified macular degeneration: Secondary | ICD-10-CM | POA: Diagnosis not present

## 2022-07-15 DIAGNOSIS — I4891 Unspecified atrial fibrillation: Secondary | ICD-10-CM | POA: Diagnosis not present

## 2022-07-15 DIAGNOSIS — M1000 Idiopathic gout, unspecified site: Secondary | ICD-10-CM | POA: Diagnosis not present

## 2022-08-13 DIAGNOSIS — I130 Hypertensive heart and chronic kidney disease with heart failure and stage 1 through stage 4 chronic kidney disease, or unspecified chronic kidney disease: Secondary | ICD-10-CM | POA: Diagnosis not present

## 2022-08-13 DIAGNOSIS — I5042 Chronic combined systolic (congestive) and diastolic (congestive) heart failure: Secondary | ICD-10-CM | POA: Diagnosis not present

## 2022-08-13 DIAGNOSIS — I4891 Unspecified atrial fibrillation: Secondary | ICD-10-CM | POA: Diagnosis not present

## 2022-08-13 DIAGNOSIS — D6869 Other thrombophilia: Secondary | ICD-10-CM | POA: Diagnosis not present

## 2022-08-13 DIAGNOSIS — M109 Gout, unspecified: Secondary | ICD-10-CM | POA: Diagnosis not present

## 2022-08-13 DIAGNOSIS — N1832 Chronic kidney disease, stage 3b: Secondary | ICD-10-CM | POA: Diagnosis not present

## 2022-08-13 DIAGNOSIS — I700 Atherosclerosis of aorta: Secondary | ICD-10-CM | POA: Diagnosis not present

## 2022-08-13 DIAGNOSIS — D510 Vitamin B12 deficiency anemia due to intrinsic factor deficiency: Secondary | ICD-10-CM | POA: Diagnosis not present

## 2022-08-13 DIAGNOSIS — I25810 Atherosclerosis of coronary artery bypass graft(s) without angina pectoris: Secondary | ICD-10-CM | POA: Diagnosis not present

## 2022-08-13 DIAGNOSIS — J8410 Pulmonary fibrosis, unspecified: Secondary | ICD-10-CM | POA: Diagnosis not present

## 2022-10-16 DIAGNOSIS — X32XXXD Exposure to sunlight, subsequent encounter: Secondary | ICD-10-CM | POA: Diagnosis not present

## 2022-10-16 DIAGNOSIS — C44629 Squamous cell carcinoma of skin of left upper limb, including shoulder: Secondary | ICD-10-CM | POA: Diagnosis not present

## 2022-10-16 DIAGNOSIS — L570 Actinic keratosis: Secondary | ICD-10-CM | POA: Diagnosis not present

## 2023-06-16 ENCOUNTER — Ambulatory Visit: Admitting: Cardiovascular Disease

## 2023-08-14 ENCOUNTER — Encounter: Payer: Self-pay | Admitting: Advanced Practice Midwife

## 2023-09-02 DIAGNOSIS — Z79899 Other long term (current) drug therapy: Secondary | ICD-10-CM | POA: Diagnosis not present

## 2023-09-02 DIAGNOSIS — K219 Gastro-esophageal reflux disease without esophagitis: Secondary | ICD-10-CM | POA: Diagnosis not present

## 2023-09-02 DIAGNOSIS — R634 Abnormal weight loss: Secondary | ICD-10-CM | POA: Diagnosis not present

## 2023-09-02 DIAGNOSIS — N1832 Chronic kidney disease, stage 3b: Secondary | ICD-10-CM | POA: Diagnosis not present

## 2023-09-02 DIAGNOSIS — B369 Superficial mycosis, unspecified: Secondary | ICD-10-CM | POA: Diagnosis not present

## 2023-09-02 DIAGNOSIS — I4891 Unspecified atrial fibrillation: Secondary | ICD-10-CM | POA: Diagnosis not present

## 2023-09-02 DIAGNOSIS — D649 Anemia, unspecified: Secondary | ICD-10-CM | POA: Diagnosis not present

## 2023-09-02 DIAGNOSIS — I10 Essential (primary) hypertension: Secondary | ICD-10-CM | POA: Diagnosis not present

## 2023-09-02 DIAGNOSIS — I5042 Chronic combined systolic (congestive) and diastolic (congestive) heart failure: Secondary | ICD-10-CM | POA: Diagnosis not present

## 2023-09-02 DIAGNOSIS — D510 Vitamin B12 deficiency anemia due to intrinsic factor deficiency: Secondary | ICD-10-CM | POA: Diagnosis not present

## 2023-09-03 DIAGNOSIS — R0989 Other specified symptoms and signs involving the circulatory and respiratory systems: Secondary | ICD-10-CM | POA: Diagnosis not present

## 2023-09-17 DIAGNOSIS — I4891 Unspecified atrial fibrillation: Secondary | ICD-10-CM | POA: Diagnosis not present

## 2023-09-17 DIAGNOSIS — M170 Bilateral primary osteoarthritis of knee: Secondary | ICD-10-CM | POA: Diagnosis not present

## 2023-09-17 DIAGNOSIS — I5042 Chronic combined systolic (congestive) and diastolic (congestive) heart failure: Secondary | ICD-10-CM | POA: Diagnosis not present

## 2023-09-17 DIAGNOSIS — H3530 Unspecified macular degeneration: Secondary | ICD-10-CM | POA: Diagnosis not present

## 2023-09-17 DIAGNOSIS — M1A00X0 Idiopathic chronic gout, unspecified site, without tophus (tophi): Secondary | ICD-10-CM | POA: Diagnosis not present

## 2023-09-17 DIAGNOSIS — D510 Vitamin B12 deficiency anemia due to intrinsic factor deficiency: Secondary | ICD-10-CM | POA: Diagnosis not present

## 2023-09-17 DIAGNOSIS — I2510 Atherosclerotic heart disease of native coronary artery without angina pectoris: Secondary | ICD-10-CM | POA: Diagnosis not present

## 2023-09-17 DIAGNOSIS — I130 Hypertensive heart and chronic kidney disease with heart failure and stage 1 through stage 4 chronic kidney disease, or unspecified chronic kidney disease: Secondary | ICD-10-CM | POA: Diagnosis not present

## 2023-09-17 DIAGNOSIS — N1832 Chronic kidney disease, stage 3b: Secondary | ICD-10-CM | POA: Diagnosis not present

## 2023-09-22 DIAGNOSIS — M1A00X0 Idiopathic chronic gout, unspecified site, without tophus (tophi): Secondary | ICD-10-CM | POA: Diagnosis not present

## 2023-09-22 DIAGNOSIS — D510 Vitamin B12 deficiency anemia due to intrinsic factor deficiency: Secondary | ICD-10-CM | POA: Diagnosis not present

## 2023-09-22 DIAGNOSIS — I2510 Atherosclerotic heart disease of native coronary artery without angina pectoris: Secondary | ICD-10-CM | POA: Diagnosis not present

## 2023-09-22 DIAGNOSIS — N1832 Chronic kidney disease, stage 3b: Secondary | ICD-10-CM | POA: Diagnosis not present

## 2023-09-22 DIAGNOSIS — M170 Bilateral primary osteoarthritis of knee: Secondary | ICD-10-CM | POA: Diagnosis not present

## 2023-09-22 DIAGNOSIS — H3530 Unspecified macular degeneration: Secondary | ICD-10-CM | POA: Diagnosis not present

## 2023-09-22 DIAGNOSIS — I130 Hypertensive heart and chronic kidney disease with heart failure and stage 1 through stage 4 chronic kidney disease, or unspecified chronic kidney disease: Secondary | ICD-10-CM | POA: Diagnosis not present

## 2023-09-22 DIAGNOSIS — I4891 Unspecified atrial fibrillation: Secondary | ICD-10-CM | POA: Diagnosis not present

## 2023-09-22 DIAGNOSIS — I5042 Chronic combined systolic (congestive) and diastolic (congestive) heart failure: Secondary | ICD-10-CM | POA: Diagnosis not present

## 2023-09-24 DIAGNOSIS — N1832 Chronic kidney disease, stage 3b: Secondary | ICD-10-CM | POA: Diagnosis not present

## 2023-09-24 DIAGNOSIS — D510 Vitamin B12 deficiency anemia due to intrinsic factor deficiency: Secondary | ICD-10-CM | POA: Diagnosis not present

## 2023-09-24 DIAGNOSIS — H3530 Unspecified macular degeneration: Secondary | ICD-10-CM | POA: Diagnosis not present

## 2023-09-24 DIAGNOSIS — M170 Bilateral primary osteoarthritis of knee: Secondary | ICD-10-CM | POA: Diagnosis not present

## 2023-09-24 DIAGNOSIS — I2510 Atherosclerotic heart disease of native coronary artery without angina pectoris: Secondary | ICD-10-CM | POA: Diagnosis not present

## 2023-09-24 DIAGNOSIS — I4891 Unspecified atrial fibrillation: Secondary | ICD-10-CM | POA: Diagnosis not present

## 2023-09-24 DIAGNOSIS — M1A00X0 Idiopathic chronic gout, unspecified site, without tophus (tophi): Secondary | ICD-10-CM | POA: Diagnosis not present

## 2023-09-24 DIAGNOSIS — I5042 Chronic combined systolic (congestive) and diastolic (congestive) heart failure: Secondary | ICD-10-CM | POA: Diagnosis not present

## 2023-09-24 DIAGNOSIS — I130 Hypertensive heart and chronic kidney disease with heart failure and stage 1 through stage 4 chronic kidney disease, or unspecified chronic kidney disease: Secondary | ICD-10-CM | POA: Diagnosis not present

## 2023-09-30 DIAGNOSIS — I4891 Unspecified atrial fibrillation: Secondary | ICD-10-CM | POA: Diagnosis not present

## 2023-09-30 DIAGNOSIS — M1A00X0 Idiopathic chronic gout, unspecified site, without tophus (tophi): Secondary | ICD-10-CM | POA: Diagnosis not present

## 2023-09-30 DIAGNOSIS — M170 Bilateral primary osteoarthritis of knee: Secondary | ICD-10-CM | POA: Diagnosis not present

## 2023-09-30 DIAGNOSIS — D510 Vitamin B12 deficiency anemia due to intrinsic factor deficiency: Secondary | ICD-10-CM | POA: Diagnosis not present

## 2023-09-30 DIAGNOSIS — I5042 Chronic combined systolic (congestive) and diastolic (congestive) heart failure: Secondary | ICD-10-CM | POA: Diagnosis not present

## 2023-09-30 DIAGNOSIS — N1832 Chronic kidney disease, stage 3b: Secondary | ICD-10-CM | POA: Diagnosis not present

## 2023-09-30 DIAGNOSIS — I2510 Atherosclerotic heart disease of native coronary artery without angina pectoris: Secondary | ICD-10-CM | POA: Diagnosis not present

## 2023-09-30 DIAGNOSIS — H3530 Unspecified macular degeneration: Secondary | ICD-10-CM | POA: Diagnosis not present

## 2023-10-02 DIAGNOSIS — H3530 Unspecified macular degeneration: Secondary | ICD-10-CM | POA: Diagnosis not present

## 2023-10-02 DIAGNOSIS — I2510 Atherosclerotic heart disease of native coronary artery without angina pectoris: Secondary | ICD-10-CM | POA: Diagnosis not present

## 2023-10-02 DIAGNOSIS — I130 Hypertensive heart and chronic kidney disease with heart failure and stage 1 through stage 4 chronic kidney disease, or unspecified chronic kidney disease: Secondary | ICD-10-CM | POA: Diagnosis not present

## 2023-10-02 DIAGNOSIS — I5042 Chronic combined systolic (congestive) and diastolic (congestive) heart failure: Secondary | ICD-10-CM | POA: Diagnosis not present

## 2023-10-02 DIAGNOSIS — I4891 Unspecified atrial fibrillation: Secondary | ICD-10-CM | POA: Diagnosis not present

## 2023-10-02 DIAGNOSIS — D510 Vitamin B12 deficiency anemia due to intrinsic factor deficiency: Secondary | ICD-10-CM | POA: Diagnosis not present

## 2023-10-02 DIAGNOSIS — M1A00X0 Idiopathic chronic gout, unspecified site, without tophus (tophi): Secondary | ICD-10-CM | POA: Diagnosis not present

## 2023-10-02 DIAGNOSIS — N1832 Chronic kidney disease, stage 3b: Secondary | ICD-10-CM | POA: Diagnosis not present

## 2023-10-02 DIAGNOSIS — M170 Bilateral primary osteoarthritis of knee: Secondary | ICD-10-CM | POA: Diagnosis not present

## 2023-10-06 DIAGNOSIS — I130 Hypertensive heart and chronic kidney disease with heart failure and stage 1 through stage 4 chronic kidney disease, or unspecified chronic kidney disease: Secondary | ICD-10-CM | POA: Diagnosis not present

## 2023-10-06 DIAGNOSIS — I5042 Chronic combined systolic (congestive) and diastolic (congestive) heart failure: Secondary | ICD-10-CM | POA: Diagnosis not present

## 2023-10-06 DIAGNOSIS — M1A00X0 Idiopathic chronic gout, unspecified site, without tophus (tophi): Secondary | ICD-10-CM | POA: Diagnosis not present

## 2023-10-06 DIAGNOSIS — H3530 Unspecified macular degeneration: Secondary | ICD-10-CM | POA: Diagnosis not present

## 2023-10-06 DIAGNOSIS — N1832 Chronic kidney disease, stage 3b: Secondary | ICD-10-CM | POA: Diagnosis not present

## 2023-10-06 DIAGNOSIS — I4891 Unspecified atrial fibrillation: Secondary | ICD-10-CM | POA: Diagnosis not present

## 2023-10-06 DIAGNOSIS — D510 Vitamin B12 deficiency anemia due to intrinsic factor deficiency: Secondary | ICD-10-CM | POA: Diagnosis not present

## 2023-10-06 DIAGNOSIS — I2510 Atherosclerotic heart disease of native coronary artery without angina pectoris: Secondary | ICD-10-CM | POA: Diagnosis not present

## 2023-10-12 DIAGNOSIS — I5042 Chronic combined systolic (congestive) and diastolic (congestive) heart failure: Secondary | ICD-10-CM | POA: Diagnosis not present

## 2023-10-12 DIAGNOSIS — M1A00X0 Idiopathic chronic gout, unspecified site, without tophus (tophi): Secondary | ICD-10-CM | POA: Diagnosis not present

## 2023-10-12 DIAGNOSIS — D510 Vitamin B12 deficiency anemia due to intrinsic factor deficiency: Secondary | ICD-10-CM | POA: Diagnosis not present

## 2023-10-14 DIAGNOSIS — N1832 Chronic kidney disease, stage 3b: Secondary | ICD-10-CM | POA: Diagnosis not present

## 2023-10-14 DIAGNOSIS — H3530 Unspecified macular degeneration: Secondary | ICD-10-CM | POA: Diagnosis not present

## 2023-10-14 DIAGNOSIS — M170 Bilateral primary osteoarthritis of knee: Secondary | ICD-10-CM | POA: Diagnosis not present

## 2023-10-14 DIAGNOSIS — I130 Hypertensive heart and chronic kidney disease with heart failure and stage 1 through stage 4 chronic kidney disease, or unspecified chronic kidney disease: Secondary | ICD-10-CM | POA: Diagnosis not present

## 2023-10-14 DIAGNOSIS — I4891 Unspecified atrial fibrillation: Secondary | ICD-10-CM | POA: Diagnosis not present

## 2023-10-14 DIAGNOSIS — I5042 Chronic combined systolic (congestive) and diastolic (congestive) heart failure: Secondary | ICD-10-CM | POA: Diagnosis not present

## 2023-10-14 DIAGNOSIS — I2510 Atherosclerotic heart disease of native coronary artery without angina pectoris: Secondary | ICD-10-CM | POA: Diagnosis not present

## 2023-10-14 DIAGNOSIS — M1A00X0 Idiopathic chronic gout, unspecified site, without tophus (tophi): Secondary | ICD-10-CM | POA: Diagnosis not present

## 2023-10-14 DIAGNOSIS — D510 Vitamin B12 deficiency anemia due to intrinsic factor deficiency: Secondary | ICD-10-CM | POA: Diagnosis not present

## 2023-10-16 DIAGNOSIS — N1832 Chronic kidney disease, stage 3b: Secondary | ICD-10-CM | POA: Diagnosis not present

## 2023-10-19 DIAGNOSIS — M1A00X0 Idiopathic chronic gout, unspecified site, without tophus (tophi): Secondary | ICD-10-CM | POA: Diagnosis not present

## 2023-10-21 DIAGNOSIS — M170 Bilateral primary osteoarthritis of knee: Secondary | ICD-10-CM | POA: Diagnosis not present

## 2023-10-21 DIAGNOSIS — I130 Hypertensive heart and chronic kidney disease with heart failure and stage 1 through stage 4 chronic kidney disease, or unspecified chronic kidney disease: Secondary | ICD-10-CM | POA: Diagnosis not present

## 2023-10-21 DIAGNOSIS — I4891 Unspecified atrial fibrillation: Secondary | ICD-10-CM | POA: Diagnosis not present

## 2023-10-21 DIAGNOSIS — H3530 Unspecified macular degeneration: Secondary | ICD-10-CM | POA: Diagnosis not present

## 2023-10-21 DIAGNOSIS — D510 Vitamin B12 deficiency anemia due to intrinsic factor deficiency: Secondary | ICD-10-CM | POA: Diagnosis not present

## 2023-10-21 DIAGNOSIS — I2510 Atherosclerotic heart disease of native coronary artery without angina pectoris: Secondary | ICD-10-CM | POA: Diagnosis not present

## 2023-10-21 DIAGNOSIS — N1832 Chronic kidney disease, stage 3b: Secondary | ICD-10-CM | POA: Diagnosis not present

## 2023-10-21 DIAGNOSIS — I5042 Chronic combined systolic (congestive) and diastolic (congestive) heart failure: Secondary | ICD-10-CM | POA: Diagnosis not present

## 2023-10-27 DIAGNOSIS — I130 Hypertensive heart and chronic kidney disease with heart failure and stage 1 through stage 4 chronic kidney disease, or unspecified chronic kidney disease: Secondary | ICD-10-CM | POA: Diagnosis not present

## 2023-10-27 DIAGNOSIS — I5042 Chronic combined systolic (congestive) and diastolic (congestive) heart failure: Secondary | ICD-10-CM | POA: Diagnosis not present

## 2023-10-27 DIAGNOSIS — I4891 Unspecified atrial fibrillation: Secondary | ICD-10-CM | POA: Diagnosis not present

## 2023-10-27 DIAGNOSIS — I2510 Atherosclerotic heart disease of native coronary artery without angina pectoris: Secondary | ICD-10-CM | POA: Diagnosis not present

## 2023-10-27 DIAGNOSIS — D510 Vitamin B12 deficiency anemia due to intrinsic factor deficiency: Secondary | ICD-10-CM | POA: Diagnosis not present

## 2023-10-27 DIAGNOSIS — M1A00X0 Idiopathic chronic gout, unspecified site, without tophus (tophi): Secondary | ICD-10-CM | POA: Diagnosis not present

## 2023-10-27 DIAGNOSIS — H3530 Unspecified macular degeneration: Secondary | ICD-10-CM | POA: Diagnosis not present

## 2023-10-27 DIAGNOSIS — N1832 Chronic kidney disease, stage 3b: Secondary | ICD-10-CM | POA: Diagnosis not present

## 2023-11-03 DIAGNOSIS — I4891 Unspecified atrial fibrillation: Secondary | ICD-10-CM | POA: Diagnosis not present

## 2023-11-03 DIAGNOSIS — I2510 Atherosclerotic heart disease of native coronary artery without angina pectoris: Secondary | ICD-10-CM | POA: Diagnosis not present

## 2023-11-03 DIAGNOSIS — D510 Vitamin B12 deficiency anemia due to intrinsic factor deficiency: Secondary | ICD-10-CM | POA: Diagnosis not present

## 2023-11-03 DIAGNOSIS — I5042 Chronic combined systolic (congestive) and diastolic (congestive) heart failure: Secondary | ICD-10-CM | POA: Diagnosis not present

## 2023-11-03 DIAGNOSIS — N1832 Chronic kidney disease, stage 3b: Secondary | ICD-10-CM | POA: Diagnosis not present

## 2023-11-03 DIAGNOSIS — H3530 Unspecified macular degeneration: Secondary | ICD-10-CM | POA: Diagnosis not present

## 2023-11-03 DIAGNOSIS — I130 Hypertensive heart and chronic kidney disease with heart failure and stage 1 through stage 4 chronic kidney disease, or unspecified chronic kidney disease: Secondary | ICD-10-CM | POA: Diagnosis not present

## 2023-11-03 DIAGNOSIS — M170 Bilateral primary osteoarthritis of knee: Secondary | ICD-10-CM | POA: Diagnosis not present

## 2023-11-03 DIAGNOSIS — M1A00X0 Idiopathic chronic gout, unspecified site, without tophus (tophi): Secondary | ICD-10-CM | POA: Diagnosis not present

## 2023-11-11 DIAGNOSIS — N1832 Chronic kidney disease, stage 3b: Secondary | ICD-10-CM | POA: Diagnosis not present

## 2023-11-11 DIAGNOSIS — I130 Hypertensive heart and chronic kidney disease with heart failure and stage 1 through stage 4 chronic kidney disease, or unspecified chronic kidney disease: Secondary | ICD-10-CM | POA: Diagnosis not present

## 2023-11-11 DIAGNOSIS — M1A00X0 Idiopathic chronic gout, unspecified site, without tophus (tophi): Secondary | ICD-10-CM | POA: Diagnosis not present

## 2023-11-11 DIAGNOSIS — D510 Vitamin B12 deficiency anemia due to intrinsic factor deficiency: Secondary | ICD-10-CM | POA: Diagnosis not present

## 2023-11-11 DIAGNOSIS — I2510 Atherosclerotic heart disease of native coronary artery without angina pectoris: Secondary | ICD-10-CM | POA: Diagnosis not present

## 2023-11-11 DIAGNOSIS — H3530 Unspecified macular degeneration: Secondary | ICD-10-CM | POA: Diagnosis not present

## 2023-11-11 DIAGNOSIS — I4891 Unspecified atrial fibrillation: Secondary | ICD-10-CM | POA: Diagnosis not present

## 2023-11-11 DIAGNOSIS — I5042 Chronic combined systolic (congestive) and diastolic (congestive) heart failure: Secondary | ICD-10-CM | POA: Diagnosis not present

## 2023-11-18 DIAGNOSIS — R0609 Other forms of dyspnea: Secondary | ICD-10-CM | POA: Diagnosis not present

## 2023-11-18 DIAGNOSIS — R0602 Shortness of breath: Secondary | ICD-10-CM | POA: Diagnosis not present

## 2023-11-18 DIAGNOSIS — Z23 Encounter for immunization: Secondary | ICD-10-CM | POA: Diagnosis not present

## 2023-11-18 DIAGNOSIS — R059 Cough, unspecified: Secondary | ICD-10-CM | POA: Diagnosis not present

## 2023-11-18 DIAGNOSIS — J8410 Pulmonary fibrosis, unspecified: Secondary | ICD-10-CM | POA: Diagnosis not present

## 2023-11-25 DIAGNOSIS — I4891 Unspecified atrial fibrillation: Secondary | ICD-10-CM | POA: Diagnosis not present

## 2023-11-25 DIAGNOSIS — I5042 Chronic combined systolic (congestive) and diastolic (congestive) heart failure: Secondary | ICD-10-CM | POA: Diagnosis not present

## 2023-11-25 DIAGNOSIS — I130 Hypertensive heart and chronic kidney disease with heart failure and stage 1 through stage 4 chronic kidney disease, or unspecified chronic kidney disease: Secondary | ICD-10-CM | POA: Diagnosis not present

## 2023-11-25 DIAGNOSIS — M1A00X0 Idiopathic chronic gout, unspecified site, without tophus (tophi): Secondary | ICD-10-CM | POA: Diagnosis not present

## 2023-11-25 DIAGNOSIS — H3530 Unspecified macular degeneration: Secondary | ICD-10-CM | POA: Diagnosis not present

## 2023-11-25 DIAGNOSIS — N1832 Chronic kidney disease, stage 3b: Secondary | ICD-10-CM | POA: Diagnosis not present

## 2023-11-25 DIAGNOSIS — D510 Vitamin B12 deficiency anemia due to intrinsic factor deficiency: Secondary | ICD-10-CM | POA: Diagnosis not present

## 2023-11-25 DIAGNOSIS — M170 Bilateral primary osteoarthritis of knee: Secondary | ICD-10-CM | POA: Diagnosis not present

## 2023-11-25 DIAGNOSIS — I2510 Atherosclerotic heart disease of native coronary artery without angina pectoris: Secondary | ICD-10-CM | POA: Diagnosis not present

## 2023-12-01 DIAGNOSIS — I5042 Chronic combined systolic (congestive) and diastolic (congestive) heart failure: Secondary | ICD-10-CM | POA: Diagnosis not present

## 2023-12-01 DIAGNOSIS — D510 Vitamin B12 deficiency anemia due to intrinsic factor deficiency: Secondary | ICD-10-CM | POA: Diagnosis not present

## 2023-12-01 DIAGNOSIS — H3530 Unspecified macular degeneration: Secondary | ICD-10-CM | POA: Diagnosis not present

## 2023-12-01 DIAGNOSIS — M1A00X0 Idiopathic chronic gout, unspecified site, without tophus (tophi): Secondary | ICD-10-CM | POA: Diagnosis not present

## 2023-12-01 DIAGNOSIS — I4891 Unspecified atrial fibrillation: Secondary | ICD-10-CM | POA: Diagnosis not present

## 2023-12-01 DIAGNOSIS — I130 Hypertensive heart and chronic kidney disease with heart failure and stage 1 through stage 4 chronic kidney disease, or unspecified chronic kidney disease: Secondary | ICD-10-CM | POA: Diagnosis not present

## 2023-12-01 DIAGNOSIS — M170 Bilateral primary osteoarthritis of knee: Secondary | ICD-10-CM | POA: Diagnosis not present

## 2023-12-01 DIAGNOSIS — N1832 Chronic kidney disease, stage 3b: Secondary | ICD-10-CM | POA: Diagnosis not present

## 2023-12-01 DIAGNOSIS — I2510 Atherosclerotic heart disease of native coronary artery without angina pectoris: Secondary | ICD-10-CM | POA: Diagnosis not present

## 2023-12-03 ENCOUNTER — Emergency Department (HOSPITAL_COMMUNITY)

## 2023-12-03 ENCOUNTER — Encounter (HOSPITAL_COMMUNITY): Payer: Self-pay

## 2023-12-03 ENCOUNTER — Other Ambulatory Visit: Payer: Self-pay

## 2023-12-03 ENCOUNTER — Inpatient Hospital Stay (HOSPITAL_COMMUNITY)
Admission: EM | Admit: 2023-12-03 | Discharge: 2023-12-07 | DRG: 193 | Disposition: A | Discharge status: Hospice | Attending: Family Medicine | Admitting: Family Medicine

## 2023-12-03 DIAGNOSIS — R54 Age-related physical debility: Secondary | ICD-10-CM | POA: Diagnosis present

## 2023-12-03 DIAGNOSIS — I252 Old myocardial infarction: Secondary | ICD-10-CM

## 2023-12-03 DIAGNOSIS — Z79899 Other long term (current) drug therapy: Secondary | ICD-10-CM

## 2023-12-03 DIAGNOSIS — N179 Acute kidney failure, unspecified: Secondary | ICD-10-CM | POA: Diagnosis present

## 2023-12-03 DIAGNOSIS — R64 Cachexia: Secondary | ICD-10-CM | POA: Diagnosis present

## 2023-12-03 DIAGNOSIS — I480 Paroxysmal atrial fibrillation: Secondary | ICD-10-CM | POA: Diagnosis present

## 2023-12-03 DIAGNOSIS — I214 Non-ST elevation (NSTEMI) myocardial infarction: Secondary | ICD-10-CM | POA: Diagnosis present

## 2023-12-03 DIAGNOSIS — Z886 Allergy status to analgesic agent status: Secondary | ICD-10-CM

## 2023-12-03 DIAGNOSIS — K5730 Diverticulosis of large intestine without perforation or abscess without bleeding: Secondary | ICD-10-CM | POA: Diagnosis not present

## 2023-12-03 DIAGNOSIS — I130 Hypertensive heart and chronic kidney disease with heart failure and stage 1 through stage 4 chronic kidney disease, or unspecified chronic kidney disease: Secondary | ICD-10-CM | POA: Diagnosis present

## 2023-12-03 DIAGNOSIS — G9341 Metabolic encephalopathy: Secondary | ICD-10-CM | POA: Diagnosis present

## 2023-12-03 DIAGNOSIS — Z8711 Personal history of peptic ulcer disease: Secondary | ICD-10-CM

## 2023-12-03 DIAGNOSIS — R7989 Other specified abnormal findings of blood chemistry: Secondary | ICD-10-CM

## 2023-12-03 DIAGNOSIS — K219 Gastro-esophageal reflux disease without esophagitis: Secondary | ICD-10-CM | POA: Diagnosis present

## 2023-12-03 DIAGNOSIS — N1832 Chronic kidney disease, stage 3b: Secondary | ICD-10-CM | POA: Diagnosis present

## 2023-12-03 DIAGNOSIS — G3184 Mild cognitive impairment, so stated: Secondary | ICD-10-CM | POA: Diagnosis present

## 2023-12-03 DIAGNOSIS — R4182 Altered mental status, unspecified: Secondary | ICD-10-CM

## 2023-12-03 DIAGNOSIS — Z681 Body mass index (BMI) 19 or less, adult: Secondary | ICD-10-CM | POA: Diagnosis not present

## 2023-12-03 DIAGNOSIS — E785 Hyperlipidemia, unspecified: Secondary | ICD-10-CM | POA: Diagnosis present

## 2023-12-03 DIAGNOSIS — Z7982 Long term (current) use of aspirin: Secondary | ICD-10-CM

## 2023-12-03 DIAGNOSIS — Z89022 Acquired absence of left finger(s): Secondary | ICD-10-CM

## 2023-12-03 DIAGNOSIS — J984 Other disorders of lung: Secondary | ICD-10-CM | POA: Diagnosis not present

## 2023-12-03 DIAGNOSIS — I2510 Atherosclerotic heart disease of native coronary artery without angina pectoris: Secondary | ICD-10-CM | POA: Diagnosis present

## 2023-12-03 DIAGNOSIS — Z951 Presence of aortocoronary bypass graft: Secondary | ICD-10-CM

## 2023-12-03 DIAGNOSIS — Z1152 Encounter for screening for COVID-19: Secondary | ICD-10-CM

## 2023-12-03 DIAGNOSIS — K449 Diaphragmatic hernia without obstruction or gangrene: Secondary | ICD-10-CM | POA: Diagnosis not present

## 2023-12-03 DIAGNOSIS — Z7901 Long term (current) use of anticoagulants: Secondary | ICD-10-CM

## 2023-12-03 DIAGNOSIS — E43 Unspecified severe protein-calorie malnutrition: Secondary | ICD-10-CM | POA: Diagnosis present

## 2023-12-03 DIAGNOSIS — Z8551 Personal history of malignant neoplasm of bladder: Secondary | ICD-10-CM

## 2023-12-03 DIAGNOSIS — Z515 Encounter for palliative care: Secondary | ICD-10-CM | POA: Diagnosis not present

## 2023-12-03 DIAGNOSIS — Z7401 Bed confinement status: Secondary | ICD-10-CM | POA: Diagnosis not present

## 2023-12-03 DIAGNOSIS — G8929 Other chronic pain: Secondary | ICD-10-CM | POA: Diagnosis present

## 2023-12-03 DIAGNOSIS — I4891 Unspecified atrial fibrillation: Secondary | ICD-10-CM | POA: Diagnosis not present

## 2023-12-03 DIAGNOSIS — J189 Pneumonia, unspecified organism: Principal | ICD-10-CM | POA: Diagnosis present

## 2023-12-03 DIAGNOSIS — R627 Adult failure to thrive: Secondary | ICD-10-CM | POA: Diagnosis present

## 2023-12-03 DIAGNOSIS — E875 Hyperkalemia: Secondary | ICD-10-CM | POA: Diagnosis present

## 2023-12-03 DIAGNOSIS — Z7189 Other specified counseling: Secondary | ICD-10-CM | POA: Diagnosis not present

## 2023-12-03 DIAGNOSIS — M109 Gout, unspecified: Secondary | ICD-10-CM | POA: Diagnosis present

## 2023-12-03 DIAGNOSIS — I499 Cardiac arrhythmia, unspecified: Secondary | ICD-10-CM | POA: Diagnosis not present

## 2023-12-03 DIAGNOSIS — Z961 Presence of intraocular lens: Secondary | ICD-10-CM | POA: Diagnosis present

## 2023-12-03 DIAGNOSIS — R569 Unspecified convulsions: Secondary | ICD-10-CM | POA: Diagnosis not present

## 2023-12-03 DIAGNOSIS — J9601 Acute respiratory failure with hypoxia: Secondary | ICD-10-CM | POA: Diagnosis present

## 2023-12-03 DIAGNOSIS — Z66 Do not resuscitate: Secondary | ICD-10-CM | POA: Diagnosis present

## 2023-12-03 DIAGNOSIS — J9602 Acute respiratory failure with hypercapnia: Secondary | ICD-10-CM | POA: Diagnosis present

## 2023-12-03 DIAGNOSIS — Z85528 Personal history of other malignant neoplasm of kidney: Secondary | ICD-10-CM

## 2023-12-03 DIAGNOSIS — I959 Hypotension, unspecified: Secondary | ICD-10-CM | POA: Diagnosis not present

## 2023-12-03 DIAGNOSIS — Z8554 Personal history of malignant neoplasm of ureter: Secondary | ICD-10-CM

## 2023-12-03 DIAGNOSIS — Z9841 Cataract extraction status, right eye: Secondary | ICD-10-CM

## 2023-12-03 DIAGNOSIS — I5042 Chronic combined systolic (congestive) and diastolic (congestive) heart failure: Secondary | ICD-10-CM | POA: Diagnosis present

## 2023-12-03 DIAGNOSIS — Z888 Allergy status to other drugs, medicaments and biological substances status: Secondary | ICD-10-CM

## 2023-12-03 DIAGNOSIS — Z87891 Personal history of nicotine dependence: Secondary | ICD-10-CM

## 2023-12-03 DIAGNOSIS — Z9842 Cataract extraction status, left eye: Secondary | ICD-10-CM

## 2023-12-03 DIAGNOSIS — R404 Transient alteration of awareness: Secondary | ICD-10-CM | POA: Diagnosis not present

## 2023-12-03 DIAGNOSIS — Z711 Person with feared health complaint in whom no diagnosis is made: Secondary | ICD-10-CM | POA: Diagnosis not present

## 2023-12-03 DIAGNOSIS — R402411 Glasgow coma scale score 13-15, in the field [EMT or ambulance]: Secondary | ICD-10-CM | POA: Diagnosis not present

## 2023-12-03 DIAGNOSIS — R531 Weakness: Secondary | ICD-10-CM

## 2023-12-03 LAB — I-STAT CHEM 8, ED
BUN: 52 mg/dL — ABNORMAL HIGH (ref 8–23)
Calcium, Ion: 1.06 mmol/L — ABNORMAL LOW (ref 1.15–1.40)
Chloride: 100 mmol/L (ref 98–111)
Creatinine, Ser: 1.6 mg/dL — ABNORMAL HIGH (ref 0.61–1.24)
Glucose, Bld: 114 mg/dL — ABNORMAL HIGH (ref 70–99)
HCT: 49 % (ref 39.0–52.0)
Hemoglobin: 16.7 g/dL (ref 13.0–17.0)
Potassium: 5.5 mmol/L — ABNORMAL HIGH (ref 3.5–5.1)
Sodium: 138 mmol/L (ref 135–145)
TCO2: 32 mmol/L (ref 22–32)

## 2023-12-03 LAB — CBC WITH DIFFERENTIAL/PLATELET
Abs Immature Granulocytes: 0.04 K/uL (ref 0.00–0.07)
Basophils Absolute: 0 K/uL (ref 0.0–0.1)
Basophils Relative: 0 %
Eosinophils Absolute: 0 K/uL (ref 0.0–0.5)
Eosinophils Relative: 0 %
HCT: 53.8 % — ABNORMAL HIGH (ref 39.0–52.0)
Hemoglobin: 16.2 g/dL (ref 13.0–17.0)
Immature Granulocytes: 0 %
Lymphocytes Relative: 9 %
Lymphs Abs: 0.9 K/uL (ref 0.7–4.0)
MCH: 28.6 pg (ref 26.0–34.0)
MCHC: 30.1 g/dL (ref 30.0–36.0)
MCV: 94.9 fL (ref 80.0–100.0)
Monocytes Absolute: 0.8 K/uL (ref 0.1–1.0)
Monocytes Relative: 8 %
Neutro Abs: 8.4 K/uL — ABNORMAL HIGH (ref 1.7–7.7)
Neutrophils Relative %: 83 %
Platelets: 262 K/uL (ref 150–400)
RBC: 5.67 MIL/uL (ref 4.22–5.81)
RDW: 16.1 % — ABNORMAL HIGH (ref 11.5–15.5)
WBC: 10.2 K/uL (ref 4.0–10.5)
nRBC: 0 % (ref 0.0–0.2)

## 2023-12-03 LAB — I-STAT VENOUS BLOOD GAS, ED
Acid-Base Excess: 5 mmol/L — ABNORMAL HIGH (ref 0.0–2.0)
Bicarbonate: 34.4 mmol/L — ABNORMAL HIGH (ref 20.0–28.0)
Calcium, Ion: 1.06 mmol/L — ABNORMAL LOW (ref 1.15–1.40)
HCT: 48 % (ref 39.0–52.0)
Hemoglobin: 16.3 g/dL (ref 13.0–17.0)
O2 Saturation: 55 %
Potassium: 5.6 mmol/L — ABNORMAL HIGH (ref 3.5–5.1)
Sodium: 138 mmol/L (ref 135–145)
TCO2: 36 mmol/L — ABNORMAL HIGH (ref 22–32)
pCO2, Ven: 68.9 mmHg — ABNORMAL HIGH (ref 44–60)
pH, Ven: 7.306 (ref 7.25–7.43)
pO2, Ven: 33 mmHg (ref 32–45)

## 2023-12-03 LAB — COMPREHENSIVE METABOLIC PANEL WITH GFR
ALT: 74 U/L — ABNORMAL HIGH (ref 0–44)
AST: 131 U/L — ABNORMAL HIGH (ref 15–41)
Albumin: 4.2 g/dL (ref 3.5–5.0)
Alkaline Phosphatase: 62 U/L (ref 38–126)
Anion gap: 14 (ref 5–15)
BUN: 44 mg/dL — ABNORMAL HIGH (ref 8–23)
CO2: 29 mmol/L (ref 22–32)
Calcium: 9 mg/dL (ref 8.9–10.3)
Chloride: 98 mmol/L (ref 98–111)
Creatinine, Ser: 1.51 mg/dL — ABNORMAL HIGH (ref 0.61–1.24)
GFR, Estimated: 43 mL/min — ABNORMAL LOW (ref >60)
Glucose, Bld: 113 mg/dL — ABNORMAL HIGH (ref 70–99)
Potassium: 5.9 mmol/L — ABNORMAL HIGH (ref 3.5–5.1)
Sodium: 141 mmol/L (ref 135–145)
Total Bilirubin: 0.8 mg/dL (ref 0.0–1.2)
Total Protein: 7.4 g/dL (ref 6.5–8.1)

## 2023-12-03 LAB — TROPONIN I (HIGH SENSITIVITY): Troponin I (High Sensitivity): 917 ng/L (ref <18)

## 2023-12-03 MED ORDER — SODIUM CHLORIDE 0.9 % IV SOLN
500.0000 mg | Freq: Once | INTRAVENOUS | Status: AC
  Administered 2023-12-03: 500 mg via INTRAVENOUS
  Filled 2023-12-03: qty 5

## 2023-12-03 MED ORDER — SODIUM CHLORIDE 0.9 % IV SOLN
2.0000 g | Freq: Once | INTRAVENOUS | Status: AC
  Administered 2023-12-03: 2 g via INTRAVENOUS
  Filled 2023-12-03: qty 20

## 2023-12-03 NOTE — ED Notes (Signed)
 Attempted to straight cath pt, resistance met, no urine return. Private msg sent to ER MD

## 2023-12-03 NOTE — ED Triage Notes (Signed)
 Pt bib ems coming from home for fatigue, altered mental and hypoxic, 59 O2 on  ra/afib lvl on arrival

## 2023-12-03 NOTE — ED Notes (Signed)
 Critical lab private message sent to ER MD

## 2023-12-03 NOTE — ED Provider Notes (Signed)
 [place] EMERGENCY DEPARTMENT AT [facility] Provider Note   CSN: [id number] Arrival date & time: 12/03/23  2108     Patient presents with: Altered Mental Status   Angel Cooper is a 89 y.o. male.  {Add pertinent medical, surgical, social history, OB history to HPI:7330} 89 year old male with a history of bladder cancer, kidney cancer, CAD status post CABG, CHF, CKD, and atrial fibrillation who presents emergency department with altered mental status. Per son [name], his father was very out of it when he got home today. Hasn't been eating for a few days and has gradually been getting weaker. Couldn't get him out of bed this morning but was coherent and said that he just felt too weak to walk. Typically is conversant but isn't responding now.  Reports that the patient's wife is also in the hospital with pneumonia and he started getting sick at approximately the same time.  No fevers or chills or cough or shortness of breath.  No nausea or vomiting or diarrhea.  Son reports that he lives at home by himself and is occasionally confused but typically is conversant and coherent. Typically gets around with a rolater and can dress himself. Will also eat it.        Prior to Admission medications   Medication Sig Start Date End Date Taking? Authorizing Provider  acetaminophen  (TYLENOL ) 500 MG tablet Take 1 tablet (500 mg total) by mouth every 4 (four) hours as needed for mild pain or headache. 03/11/21   Elgergawy, Brayton RAMAN, MD  albuterol  (PROVENTIL ) (2.5 MG/3ML) 0.083% nebulizer solution Take 3 mLs (2.5 mg total) by nebulization every 4 (four) hours as needed for shortness of breath or wheezing. Patient not taking: Reported on 04/18/2021 03/11/21   Elgergawy, Brayton RAMAN, MD  allopurinol  (ZYLOPRIM ) 100 MG tablet Take 0.5 tablets (50 mg total) by mouth daily. Patient not taking: Reported on 06/20/2022 02/01/21   Patsy Lenis, MD  ALPRAZolam  (XANAX ) 0.25 MG tablet Take 1 tablet (0.25 mg  total) by mouth at bedtime as needed for sleep. Patient not taking: Reported on 04/18/2021 03/11/21   Elgergawy, Brayton RAMAN, MD  Cyanocobalamin  (B-12 COMPLIANCE INJECTION) 1000 MCG/ML KIT Inject 1,000 mcg as directed every 30 (thirty) days.    [provider]  diltiazem  (CARDIZEM  CD) 120 MG 24 hr capsule Take 1 capsule (120 mg total) by mouth daily. Patient not taking: Reported on 06/20/2022 04/18/21   Court Dorn PARAS, MD  diltiazem  (CARDIZEM  SR) 60 MG 12 hr capsule Take 1 capsule (60 mg total) by mouth 2 (two) times daily. 06/09/22   Court Dorn PARAS, MD  ELIQUIS  2.5 MG TABS tablet TAKE ONE TABLET TWICE DAILY Patient not taking: Reported on 06/20/2022 06/13/21   Court Dorn PARAS, MD  feeding supplement (ENSURE ENLIVE / ENSURE PLUS) LIQD Take 237 mLs by mouth 3 (three) times daily between meals. Patient not taking: Reported on 06/20/2022 03/11/21   Elgergawy, Brayton RAMAN, MD  furosemide  (LASIX ) 20 MG tablet TAKE THREE TABLETS BY MOUTH TWICE DAILY 09/25/21   Court Dorn PARAS, MD  furosemide  (LASIX ) 40 MG tablet Take 1.5 tablets (60 mg total) by mouth 2 (two) times daily. Patient not taking: Reported on 06/20/2022 03/11/21   Elgergawy, Brayton RAMAN, MD  HYDROcodone -acetaminophen  (NORCO/VICODIN) 5-325 MG tablet Take 1 tablet by mouth every 6 (six) hours as needed for severe pain. 03/11/21   Elgergawy, Brayton RAMAN, MD  midodrine  (PROAMATINE ) 5 MG tablet Take 0.5 tablets (2.5 mg total) by mouth 2 (two) times  daily with a meal. Patient not taking: Reported on 04/18/2021 03/11/21   Elgergawy, Dawood S, MD  multivitamin (RENA-VIT) TABS tablet Take 1 tablet by mouth at bedtime. 03/11/21   Elgergawy, Brayton RAMAN, MD  pantoprazole  (PROTONIX ) 40 MG tablet Take 1 tablet (40 mg total) by mouth daily at 12 noon. 03/11/21   Elgergawy, Dawood S, MD  polyethylene glycol (MIRALAX  / GLYCOLAX ) 17 g packet Take 17 g by mouth daily as needed. 03/11/21   Elgergawy, Brayton RAMAN, MD  sennosides-docusate sodium  (SENOKOT-S) 8.6-50 MG tablet Take  2 tablets by mouth daily. Patient not taking: Reported on 06/20/2022    [provider]    Allergies: Aspirin, Chlorhexidine, Enalapril, Gabapentin, and Statins    Review of Systems  Updated Vital Signs BP (!) 153/73 (BP Location: Right Arm)   Pulse (!) 108   Temp 98.3 F (36.8 C) (Axillary)   Resp 17   Ht 5' 7 (1.702 m)   Wt 54 kg   SpO2 93%   BMI 18.65 kg/m   Physical Exam Vitals and nursing note reviewed.  Constitutional:      General: He is in acute distress.     Appearance: He is well-developed. He is ill-appearing.     Comments: Drowsy but occasionally responds to voice  HENT:     Head: Normocephalic and atraumatic.     Right Ear: External ear normal.     Left Ear: External ear normal.     Nose: Nose normal.  Eyes:     Conjunctiva/sclera: Conjunctivae normal.     Pupils: Pupils are equal, round, and reactive to light.     Comments: Strabismus noted.  Pupils 2 mm bilaterally  Cardiovascular:     Rate and Rhythm: Normal rate and regular rhythm.     Heart sounds: Normal heart sounds.  Pulmonary:     Effort: Pulmonary effort is normal. No respiratory distress.     Breath sounds: Normal breath sounds.  Abdominal:     General: There is no distension.     Palpations: Abdomen is soft. There is no mass.     Tenderness: There is no abdominal tenderness. There is no guarding.  Musculoskeletal:     Cervical back: Normal range of motion and neck supple.     Right lower leg: No edema.     Left lower leg: No edema.  Skin:    General: Skin is warm and dry.  Neurological:     Mental Status: He is disoriented.     Comments: Cranial nerves II through XII intact aside from the strabismus.  Globally weak but no drift in bilateral upper and lower extremities.  Intact sensation to light touch in all 4 extremities.  Psychiatric:        Mood and Affect: Mood normal.        Behavior: Behavior normal.     (all labs ordered are listed, but only abnormal results are  displayed) Labs Reviewed - No data to display  EKG: None  Radiology: No results found.  {Document cardiac monitor, telemetry assessment procedure when appropriate:32947} Procedures   Medications Ordered in the ED - No data to display  Clinical Course as of 12/04/23 0001  Thu Dec 03, 2023  2305 Creatinine(!): 1.51 At baseline [RP]    Clinical Course User Index [RP] Yolande Lamar BROCKS, MD   {Click here for ABCD2, HEART and other calculators REFRESH Note before signing:1}  Medical Decision Making Amount and/or Complexity of Data Reviewed Labs: ordered. Decision-making details documented in ED Course. Radiology: ordered.  Risk Decision regarding hospitalization.   ***  {Document critical care time when appropriate  Document review of labs and clinical decision tools ie CHADS2VASC2, etc  Document your independent review of radiology images and any outside records  Document your discussion with family members, caretakers and with consultants  Document social determinants of health affecting pt's care  Document your decision making why or why not admission, treatments were needed:32947:::1}   Final diagnoses:  None    ED Discharge Orders     None

## 2023-12-03 NOTE — ED Notes (Signed)
 Per ems pt was found altered by friends on a check in today, lives with wife, wife is currently in hospital unknown last time well.

## 2023-12-04 ENCOUNTER — Observation Stay (HOSPITAL_COMMUNITY)

## 2023-12-04 ENCOUNTER — Encounter (HOSPITAL_COMMUNITY): Payer: Self-pay | Admitting: Internal Medicine

## 2023-12-04 DIAGNOSIS — R4182 Altered mental status, unspecified: Secondary | ICD-10-CM | POA: Diagnosis not present

## 2023-12-04 DIAGNOSIS — Z711 Person with feared health complaint in whom no diagnosis is made: Secondary | ICD-10-CM | POA: Diagnosis not present

## 2023-12-04 DIAGNOSIS — R7989 Other specified abnormal findings of blood chemistry: Secondary | ICD-10-CM

## 2023-12-04 DIAGNOSIS — E43 Unspecified severe protein-calorie malnutrition: Secondary | ICD-10-CM | POA: Diagnosis present

## 2023-12-04 DIAGNOSIS — N1832 Chronic kidney disease, stage 3b: Secondary | ICD-10-CM | POA: Diagnosis present

## 2023-12-04 DIAGNOSIS — R64 Cachexia: Secondary | ICD-10-CM | POA: Diagnosis present

## 2023-12-04 DIAGNOSIS — N179 Acute kidney failure, unspecified: Secondary | ICD-10-CM | POA: Diagnosis present

## 2023-12-04 DIAGNOSIS — R569 Unspecified convulsions: Secondary | ICD-10-CM | POA: Diagnosis not present

## 2023-12-04 DIAGNOSIS — E785 Hyperlipidemia, unspecified: Secondary | ICD-10-CM | POA: Diagnosis present

## 2023-12-04 DIAGNOSIS — Z1152 Encounter for screening for COVID-19: Secondary | ICD-10-CM | POA: Diagnosis not present

## 2023-12-04 DIAGNOSIS — K5730 Diverticulosis of large intestine without perforation or abscess without bleeding: Secondary | ICD-10-CM | POA: Diagnosis not present

## 2023-12-04 DIAGNOSIS — G9341 Metabolic encephalopathy: Secondary | ICD-10-CM

## 2023-12-04 DIAGNOSIS — Z66 Do not resuscitate: Secondary | ICD-10-CM | POA: Diagnosis present

## 2023-12-04 DIAGNOSIS — I5042 Chronic combined systolic (congestive) and diastolic (congestive) heart failure: Secondary | ICD-10-CM | POA: Diagnosis present

## 2023-12-04 DIAGNOSIS — Z7189 Other specified counseling: Secondary | ICD-10-CM | POA: Diagnosis not present

## 2023-12-04 DIAGNOSIS — I214 Non-ST elevation (NSTEMI) myocardial infarction: Principal | ICD-10-CM

## 2023-12-04 DIAGNOSIS — G8929 Other chronic pain: Secondary | ICD-10-CM | POA: Diagnosis present

## 2023-12-04 DIAGNOSIS — G3184 Mild cognitive impairment, so stated: Secondary | ICD-10-CM | POA: Diagnosis present

## 2023-12-04 DIAGNOSIS — K449 Diaphragmatic hernia without obstruction or gangrene: Secondary | ICD-10-CM | POA: Diagnosis not present

## 2023-12-04 DIAGNOSIS — I2510 Atherosclerotic heart disease of native coronary artery without angina pectoris: Secondary | ICD-10-CM | POA: Diagnosis present

## 2023-12-04 DIAGNOSIS — Z7901 Long term (current) use of anticoagulants: Secondary | ICD-10-CM | POA: Diagnosis not present

## 2023-12-04 DIAGNOSIS — K219 Gastro-esophageal reflux disease without esophagitis: Secondary | ICD-10-CM | POA: Diagnosis present

## 2023-12-04 DIAGNOSIS — Z681 Body mass index (BMI) 19 or less, adult: Secondary | ICD-10-CM | POA: Diagnosis not present

## 2023-12-04 DIAGNOSIS — I480 Paroxysmal atrial fibrillation: Secondary | ICD-10-CM | POA: Diagnosis present

## 2023-12-04 DIAGNOSIS — J9601 Acute respiratory failure with hypoxia: Secondary | ICD-10-CM | POA: Diagnosis present

## 2023-12-04 DIAGNOSIS — R627 Adult failure to thrive: Secondary | ICD-10-CM | POA: Diagnosis present

## 2023-12-04 DIAGNOSIS — I130 Hypertensive heart and chronic kidney disease with heart failure and stage 1 through stage 4 chronic kidney disease, or unspecified chronic kidney disease: Secondary | ICD-10-CM | POA: Diagnosis present

## 2023-12-04 DIAGNOSIS — J189 Pneumonia, unspecified organism: Secondary | ICD-10-CM | POA: Diagnosis present

## 2023-12-04 DIAGNOSIS — J9602 Acute respiratory failure with hypercapnia: Secondary | ICD-10-CM | POA: Diagnosis present

## 2023-12-04 DIAGNOSIS — Z515 Encounter for palliative care: Secondary | ICD-10-CM | POA: Diagnosis not present

## 2023-12-04 DIAGNOSIS — E875 Hyperkalemia: Secondary | ICD-10-CM | POA: Diagnosis present

## 2023-12-04 LAB — URINALYSIS, ROUTINE W REFLEX MICROSCOPIC
Bacteria, UA: NONE SEEN
Bilirubin Urine: NEGATIVE
Glucose, UA: NEGATIVE mg/dL
Ketones, ur: NEGATIVE mg/dL
Nitrite: NEGATIVE
Protein, ur: >=300 mg/dL — AB
RBC / HPF: >50 RBC/hpf (ref 0–5)
Specific Gravity, Urine: 1.022 (ref 1.005–1.030)
pH: 5 (ref 5.0–8.0)

## 2023-12-04 LAB — RESPIRATORY PANEL BY PCR

## 2023-12-04 LAB — COMPREHENSIVE METABOLIC PANEL WITH GFR
ALT: 57 U/L — ABNORMAL HIGH (ref 0–44)
AST: 106 U/L — ABNORMAL HIGH (ref 15–41)
Albumin: 3.5 g/dL (ref 3.5–5.0)
Alkaline Phosphatase: 54 U/L (ref 38–126)
Anion gap: 12 (ref 5–15)
BUN: 47 mg/dL — ABNORMAL HIGH (ref 8–23)
CO2: 29 mmol/L (ref 22–32)
Calcium: 8.5 mg/dL — ABNORMAL LOW (ref 8.9–10.3)
Chloride: 100 mmol/L (ref 98–111)
Creatinine, Ser: 1.8 mg/dL — ABNORMAL HIGH (ref 0.61–1.24)
GFR, Estimated: 35 mL/min — ABNORMAL LOW (ref >60)
Glucose, Bld: 81 mg/dL (ref 70–99)
Potassium: 5.8 mmol/L — ABNORMAL HIGH (ref 3.5–5.1)
Sodium: 141 mmol/L (ref 135–145)
Total Bilirubin: 0.9 mg/dL (ref 0.0–1.2)
Total Protein: 6.2 g/dL — ABNORMAL LOW (ref 6.5–8.1)

## 2023-12-04 LAB — AMMONIA
Ammonia: 29 umol/L (ref 9–35)
Ammonia: 32 umol/L (ref 9–35)

## 2023-12-04 LAB — BLOOD GAS, VENOUS
Acid-Base Excess: 6.1 mmol/L — ABNORMAL HIGH (ref 0.0–2.0)
Bicarbonate: 37.2 mmol/L — ABNORMAL HIGH (ref 20.0–28.0)
Drawn by: 67773
O2 Saturation: 59.8 %
Patient temperature: 37
pCO2, Ven: 91 mmHg (ref 44–60)
pH, Ven: 7.22 — ABNORMAL LOW (ref 7.25–7.43)
pO2, Ven: 34 mmHg (ref 32–45)

## 2023-12-04 LAB — RAPID URINE DRUG SCREEN, HOSP PERFORMED
Amphetamines: NOT DETECTED
Barbiturates: NOT DETECTED
Benzodiazepines: NOT DETECTED
Cocaine: NOT DETECTED
Opiates: POSITIVE — AB
Tetrahydrocannabinol: NOT DETECTED

## 2023-12-04 LAB — POTASSIUM: Potassium: 5.1 mmol/L (ref 3.5–5.1)

## 2023-12-04 LAB — ECHOCARDIOGRAM COMPLETE
Height: 67 in
S' Lateral: 3 cm
Single Plane A2C EF: 46.9 %
Weight: 1904.77 [oz_av]

## 2023-12-04 LAB — RESP PANEL BY RT-PCR (RSV, FLU A&B, COVID)  RVPGX2
Influenza A by PCR: NEGATIVE
Influenza B by PCR: NEGATIVE
Resp Syncytial Virus by PCR: NEGATIVE
SARS Coronavirus 2 by RT PCR: NEGATIVE

## 2023-12-04 LAB — TSH: TSH: 4.592 u[IU]/mL — ABNORMAL HIGH (ref 0.350–4.500)

## 2023-12-04 LAB — PROCALCITONIN: Procalcitonin: <0.1 ng/mL

## 2023-12-04 LAB — BRAIN NATRIURETIC PEPTIDE: B Natriuretic Peptide: 1207 pg/mL — ABNORMAL HIGH (ref 0.0–100.0)

## 2023-12-04 LAB — HEPARIN LEVEL (UNFRACTIONATED)
Heparin Unfractionated: 0.11 [IU]/mL — ABNORMAL LOW (ref 0.30–0.70)
Heparin Unfractionated: 0.3 [IU]/mL (ref 0.30–0.70)

## 2023-12-04 LAB — VITAMIN B12: Vitamin B-12: 759 pg/mL (ref 180–914)

## 2023-12-04 LAB — ETHANOL: Alcohol, Ethyl (B): <15 mg/dL (ref <15)

## 2023-12-04 LAB — TROPONIN I (HIGH SENSITIVITY): Troponin I (High Sensitivity): 4552 ng/L (ref <18)

## 2023-12-04 MED ORDER — ASPIRIN 300 MG RE SUPP
300.0000 mg | Freq: Once | RECTAL | Status: AC
  Administered 2023-12-04: 300 mg via RECTAL
  Filled 2023-12-04: qty 1

## 2023-12-04 MED ORDER — SODIUM ZIRCONIUM CYCLOSILICATE 10 G PO PACK: 10.0000 g | PACK | Freq: Once | ORAL | Status: DC

## 2023-12-04 MED ORDER — HEPARIN BOLUS VIA INFUSION
1500.0000 [IU] | Freq: Once | INTRAVENOUS | Status: AC
  Administered 2023-12-04: 1500 [IU] via INTRAVENOUS
  Filled 2023-12-04: qty 1500

## 2023-12-04 MED ORDER — ASPIRIN 300 MG RE SUPP
300.0000 mg | Freq: Every day | RECTAL | Status: DC
  Administered 2023-12-04: 300 mg via RECTAL
  Filled 2023-12-04 (×2): qty 1

## 2023-12-04 MED ORDER — SODIUM CHLORIDE 0.9 % IV SOLN
500.0000 mg | INTRAVENOUS | Status: DC
  Administered 2023-12-05 – 2023-12-07 (×3): 500 mg via INTRAVENOUS
  Filled 2023-12-04 (×3): qty 5

## 2023-12-04 MED ORDER — SODIUM CHLORIDE 0.9 % IV SOLN
1.0000 g | INTRAVENOUS | Status: DC
  Administered 2023-12-04 – 2023-12-06 (×3): 1 g via INTRAVENOUS
  Filled 2023-12-04 (×3): qty 10

## 2023-12-04 MED ORDER — SODIUM CHLORIDE 0.9 % IV SOLN: INTRAVENOUS | Status: AC

## 2023-12-04 MED ORDER — HEPARIN BOLUS VIA INFUSION
2000.0000 [IU] | Freq: Once | INTRAVENOUS | Status: AC
  Administered 2023-12-04: 2000 [IU] via INTRAVENOUS
  Filled 2023-12-04: qty 2000

## 2023-12-04 MED ORDER — SODIUM CHLORIDE 0.9 % IV BOLUS
500.0000 mL | Freq: Once | INTRAVENOUS | Status: AC
  Administered 2023-12-04: 500 mL via INTRAVENOUS

## 2023-12-04 MED ORDER — HEPARIN (PORCINE) 25000 UT/250ML-% IV SOLN
850.0000 [IU]/h | INTRAVENOUS | Status: DC
  Administered 2023-12-04: 650 [IU]/h via INTRAVENOUS
  Administered 2023-12-05: 850 [IU]/h via INTRAVENOUS
  Filled 2023-12-04 (×2): qty 250

## 2023-12-04 NOTE — Progress Notes (Signed)
 RN and RT transported pt on BIPAP without complication.

## 2023-12-04 NOTE — Progress Notes (Signed)
   12/03/23 2350  BiPAP/CPAP/SIPAP  BiPAP/CPAP/SIPAP Pt Type Adult  BiPAP/CPAP/SIPAP SERVO  Mask Type Full face mask  Mask Size Medium  Set Rate 22 breaths/min  Respiratory Rate 23 breaths/min  IPAP 14 cmH20  EPAP 5 cmH2O  FiO2 (%) 40 %  Minute Ventilation 8  Leak 85  Peak Inspiratory Pressure (PIP) 13  Tidal Volume (Vt) 311  Patient Home Machine No  Patient Home Mask No  Patient Home Tubing No  Auto Titrate No  Device Plugged into RED Power Outlet Yes  BiPAP/CPAP /SiPAP Vitals  Pulse Rate (!) 113  Resp (!) 23  Bilateral Breath Sounds Diminished

## 2023-12-04 NOTE — Progress Notes (Signed)
 Patient states he is not ready to be placed on Bipap, will be monitoring.

## 2023-12-04 NOTE — Plan of Care (Signed)
 ?  Problem: Clinical Measurements: ?Goal: Will remain free from infection ?Outcome: Progressing ?  ?

## 2023-12-04 NOTE — H&P (Signed)
 History and Physical    Angel Cooper FMW:[id number] DOB: [birth year] DOA: 12/03/2023  PCP: Charlott Dorn LABOR, MD  Patient coming from: Home  Chief Complaint: AMS  HPI: Angel Cooper is a 89 y.o. male with medical history significant of with history of CKD stage IIIb, A-fib on Eliquis , HFrEF, CAD status post CABG, hypertension, hyperlipidemia, gout, GERD, PUD, ureteral carcinoma status post right nephroureterectomy in 2014 presented to the ED via EMS for evaluation of altered mental status and generalized weakness.  Patient arrived to the ED on 15 L oxygen via nonrebreather.  Tachycardic on arrival but afebrile and not hypotensive.  CBC showing no leukocytosis.  CMP showing potassium 5.9 (sample possibly hemolyzed), glucose 113, creatinine 1.5 (previously 1.3-1.6 on labs done in February 2023), AST 131, ALT 74 (transaminases elevated on previous labs done in February 2023 as well and no significant change), alk phos and T. bili normal.  I-STAT chemistry showing potassium 5.5.  UDS positive for opiates.  UA not suggestive of infection.  VBG with pH 7.30 and pCO2 68.9.  COVID/influenza/RSV PCR negative.  Ethanol level <15.  Ammonia level normal.  Troponin 917.  EKG showing sinus tachycardia and no acute ischemic changes.  Chest x-ray showing patchy bilateral airspace disease superimposed on background scarring.  Negative right upper quadrant abdominal ultrasound.  CT abdomen pelvis showing no definite acute findings in the abdomen or pelvis, small right pleural effusion, and moderate hiatal hernia.  CT head showing no acute intracranial abnormality.  Patient was given aspirin, ceftriaxone , azithromycin , 500 mL IV fluids, and started on heparin  drip.  He was placed on bipap. ED physician confirmed CODE STATUS with the patient's sons and patient is DNR/DNI.  Cardiology evaluated the patient and felt that troponin elevation is likely secondary to demand ischemia rather than ACS.  Recommended  continuing medical management for now and felt that the risk of cardiac catheterization at his age outweighed any benefits at this time.   Patient is currently on BiPAP and very somnolent.  He opens his eyes but otherwise not able to answer any questions and does not follow commands.  History provided by his son [name] at bedside who states patient has been very somnolent and lethargic for the past 2 days and staying mostly in bed but was still coherent.  Since yesterday family has noticed confusion.  No focal weakness reported.  No history of previous stroke or seizures reported.  Son states patient's wife is currently in the hospital with pneumonia and he started getting sick at approximately the same time.  He has not had any fevers at home.  Son states patient has chronic abdominal pain and history of bowel obstruction in the past.  He has not had any bowel movements for the past 5 days but has not vomited.  He does take hydrocodone  on occasion for chronic pain.  Son states patient has not complained of any chest pain.  States normally patient is oriented x 4 with only occasional mild confusion and is able to ambulate with a walker and feeds himself.  Review of Systems:  Review of Systems  Reason unable to perform ROS: AMS.    Past Medical History:  Diagnosis Date   Atrial fibrillation (HCC)    2015  W/ RVR   Bladder tumor    CHF (congestive heart failure) (HCC) 2015   CKD (chronic kidney disease), stage III (HCC)    Coronary artery disease    NO LONGER SEES CARDIOLOGIST - LAST SAW DR.  H. SMITH 2 OR MORE YRS AGO-PT SEES PCP DR NORLEEN GRIFFIN   GERD (gastroesophageal reflux disease)    Hiatal hernia    History of colon polyps    History of gastric ulcer    1989  peptic and duodenal   History of MI (myocardial infarction)    1995   History of non-ST elevation myocardial infarction (NSTEMI)    07-05-1999  s/p cabg   Hyperlipidemia    Hypertension    OA (osteoarthritis)    Osteomyelitis  (HCC) 12/07/2018   S/P CABG (coronary artery bypass graft)    06/ 2001   Ureteral carcinoma, right [facility]) urologist-  dr herrick/  oncologist- dr amadeo   dx 2014  s/p  right nephroureterectomy 01-24-2013 (T3, N0) invasive high grade urothelial carcinoma--     Past Surgical History:  Procedure Laterality Date   AMPUTATION FINGER Left 12/07/2018   Procedure: Amputation Left Long Finger;  Surgeon: Camella Fallow, MD;  Location: [facility];  Service: Orthopedics;  Laterality: Left;   CARDIAC CATHETERIZATION  07/05/1999   non-q MI;  3vessel CAD w/ high grade pLAD and mLAD, subtotal ramus branch,  OM1 70-80%,  ostialCFX 50-60%,  dCFx 80%,  RI 99%,  total occluded pRCA and fills last by right-to-right collaterals,  pLM  20% narrowing;  severe inferoapical hypokinesis   CATARACT EXTRACTION W/ INTRAOCULAR LENS  IMPLANT, BILATERAL     CORONARY ARTERY BYPASS GRAFT  06/ 2001  dr fleeta tright   CYSTOSCOPY W/ RETROGRADES Left 01/31/2016   Procedure: CYSTOSCOPY WITH RETROGRADE PYELOGRAM;  Surgeon: Morene LELON Salines, MD;  Location: [facility];  Service: Urology;  Laterality: Left;   CYSTOSCOPY WITH RETROGRADE PYELOGRAM, URETEROSCOPY AND STENT PLACEMENT Right 12/03/2012   Procedure: CYSTOSCOPY WITH RIGHT RETROGRADE PYELOGRAM,RIGHT URETERAL WASHINGS, RIGHT URETERAL BIOPSY, RIGHT URETEROSCOPY AND STENT PLACEMENT;  Surgeon: Morene LELON Salines, MD;  Location: [facility];  Service: Urology;  Laterality: Right;   CYSTOSCOPY WITH RETROGRADE PYELOGRAM, URETEROSCOPY AND STENT PLACEMENT Right 12/15/2012   Procedure: CYSTOSCOPY WITH RIGHT RETROGRADE PYELOGRAM, POSSIBLE RIGHT URETEROSCOPY AND STENT PLACEMENT AND REMOVAL OF LEFT STENT;  Surgeon: Morene LELON Salines, MD;  Location: [facility];  Service: Urology;  Laterality: Right;   CYSTOSCOPY WITH STENT PLACEMENT Right 01/24/2013   Procedure: CYSTOSCOPY WITH STENT PLACEMENT;  Surgeon: Morene LELON Salines, MD;  Location: [facility];  Service: Urology;  Laterality: Right;   I & D  EXTREMITY Left 12/07/2018   Procedure: IRRIGATION AND DEBRIDEMENT EXTREMITY;  Surgeon: Camella Fallow, MD;  Location: [facility];  Service: Orthopedics;  Laterality: Left;   INCISION AND DRAINAGE ABSCESS Left 06/02/2016   Procedure: INCISION AND DRAINAGE LEFT SMALL FINGER;  Surgeon: Murrell Kuba, MD;  Location: [facility];  Service: Orthopedics;  Laterality: Left;   ROBOT ASSITED LAPAROSCOPIC NEPHROURETERECTOMY Right 01/24/2013   Procedure: ROBOT ASSISTED LAPAROSCOPIC NEPHROURETERECTOMY WITH BILATERAL LYMPH NODE DISSECTION ;  Surgeon: Morene LELON Salines, MD;  Location: [facility];  Service: Urology;  Laterality: Right;  right kidney   TRANSTHORACIC ECHOCARDIOGRAM  01/28/2013   mild LVH,  ef 45-50%, diffuse hypokinesis,  grade 1 diastolic dysfunction/  moderate LAE/  trivial PR/  mild TR, PASP 50-56mmHg   TRANSURETHRAL RESECTION OF BLADDER TUMOR N/A 01/31/2016   Procedure: TRANSURETHRAL RESECTION OF BLADDER TUMOR (TURBT);  Surgeon: Morene LELON Salines, MD;  [facility];  Service: Urology;  Laterality: N/A;     reports that he quit smoking about 65 years ago. His smoking use included cigarettes. He has  never used smokeless tobacco. He reports that he does not currently use alcohol. He reports that he does not use drugs.  Allergies  Allergen Reactions   Aspirin Other (See Comments)    Stomach pain    Chlorhexidine Other (See Comments)    CAUSED SKIN IRRITATION AND BURNING SENSATION   Enalapril Other (See Comments)    Cannot take due to kidney compromise   Gabapentin Other (See Comments)    Caused nightmares   Statins Other (See Comments)    Myalgia    History reviewed. No pertinent family history.  Prior to Admission medications   Medication Sig Start Date End Date Taking? Authorizing Provider  acetaminophen  (TYLENOL ) 500 MG tablet Take 1 tablet (500 mg total) by mouth every 4 (four) hours as needed for mild pain or headache. 03/11/21   Elgergawy, Brayton RAMAN, MD   albuterol  (PROVENTIL ) (2.5 MG/3ML) 0.083% nebulizer solution Take 3 mLs (2.5 mg total) by nebulization every 4 (four) hours as needed for shortness of breath or wheezing. Patient not taking: Reported on 04/18/2021 03/11/21   Elgergawy, Brayton RAMAN, MD  allopurinol  (ZYLOPRIM ) 100 MG tablet Take 0.5 tablets (50 mg total) by mouth daily. Patient not taking: Reported on 06/20/2022 02/01/21   Patsy Lenis, MD  ALPRAZolam  (XANAX ) 0.25 MG tablet Take 1 tablet (0.25 mg total) by mouth at bedtime as needed for sleep. Patient not taking: Reported on 04/18/2021 03/11/21   Elgergawy, Brayton RAMAN, MD  Cyanocobalamin  (B-12 COMPLIANCE INJECTION) 1000 MCG/ML KIT Inject 1,000 mcg as directed every 30 (thirty) days.    [provider]  diltiazem  (CARDIZEM  CD) 120 MG 24 hr capsule Take 1 capsule (120 mg total) by mouth daily. Patient not taking: Reported on 06/20/2022 04/18/21   Court Dorn PARAS, MD  diltiazem  (CARDIZEM  SR) 60 MG 12 hr capsule Take 1 capsule (60 mg total) by mouth 2 (two) times daily. 06/09/22   Court Dorn PARAS, MD  ELIQUIS  2.5 MG TABS tablet TAKE ONE TABLET TWICE DAILY Patient not taking: Reported on 06/20/2022 06/13/21   Court Dorn PARAS, MD  feeding supplement (ENSURE ENLIVE / ENSURE PLUS) LIQD Take 237 mLs by mouth 3 (three) times daily between meals. Patient not taking: Reported on 06/20/2022 03/11/21   Elgergawy, Brayton RAMAN, MD  furosemide  (LASIX ) 20 MG tablet TAKE THREE TABLETS BY MOUTH TWICE DAILY 09/25/21   Court Dorn PARAS, MD  furosemide  (LASIX ) 40 MG tablet Take 1.5 tablets (60 mg total) by mouth 2 (two) times daily. Patient not taking: Reported on 06/20/2022 03/11/21   Elgergawy, Brayton RAMAN, MD  HYDROcodone -acetaminophen  (NORCO/VICODIN) 5-325 MG tablet Take 1 tablet by mouth every 6 (six) hours as needed for severe pain. 03/11/21   Elgergawy, Brayton RAMAN, MD  midodrine  (PROAMATINE ) 5 MG tablet Take 0.5 tablets (2.5 mg total) by mouth 2 (two) times daily with a meal. Patient not taking: Reported on  04/18/2021 03/11/21   Elgergawy, Dawood S, MD  multivitamin (RENA-VIT) TABS tablet Take 1 tablet by mouth at bedtime. 03/11/21   Elgergawy, Brayton RAMAN, MD  pantoprazole  (PROTONIX ) 40 MG tablet Take 1 tablet (40 mg total) by mouth daily at 12 noon. 03/11/21   Elgergawy, Dawood S, MD  polyethylene glycol (MIRALAX  / GLYCOLAX ) 17 g packet Take 17 g by mouth daily as needed. 03/11/21   Elgergawy, Brayton RAMAN, MD  sennosides-docusate sodium  (SENOKOT-S) 8.6-50 MG tablet Take 2 tablets by mouth daily. Patient not taking: Reported on 06/20/2022    [provider]    Physical Exam: Vitals:  12/03/23 2119 12/03/23 2233 12/03/23 2350 12/04/23 0015  BP:  (!) 150/70  111/71  Pulse:  (!) 113 (!) 113 (!) 111  Resp:   (!) 23 17  Temp:      TempSrc:      SpO2:  100%  100%  Weight: 54 kg     Height: 5' 7 (1.702 m)       Physical Exam Vitals reviewed.  Constitutional:      General: He is not in acute distress. HENT:     Head: Normocephalic and atraumatic.  Eyes:     Comments: Strabismus No pinpoint pupils  Cardiovascular:     Rate and Rhythm: Normal rate and regular rhythm.  Pulmonary:     Effort: Pulmonary effort is normal. No respiratory distress.     Breath sounds: Normal breath sounds.  Abdominal:     General: Bowel sounds are normal. There is no distension.     Palpations: Abdomen is soft.     Tenderness: There is no abdominal tenderness. There is no guarding.  Musculoskeletal:     Cervical back: Normal range of motion.     Right lower leg: No edema.     Left lower leg: No edema.  Skin:    General: Skin is warm and dry.  Neurological:     Mental Status: He is alert.     Comments: Very somnolent.  Opens his eyes but does not speak and does not follow any commands.     Labs on Admission: I have personally reviewed following labs and imaging studies  CBC: Recent Labs  Lab 12/03/23 2156 12/03/23 2159  WBC 10.2  --   NEUTROABS 8.4*  --   HGB 16.2 16.7  16.3  HCT 53.8* 49.0   48.0  MCV 94.9  --   PLT 262  --    Basic Metabolic Panel: Recent Labs  Lab 12/03/23 2156 12/03/23 2159  NA 141 138  138  K 5.9* 5.5*  5.6*  CL 98 100  CO2 29  --   GLUCOSE 113* 114*  BUN 44* 52*  CREATININE 1.51* 1.60*  CALCIUM  9.0  --    GFR: Estimated Creatinine Clearance: 23 mL/min (A) (by C-G formula based on SCr of 1.6 mg/dL (H)). Liver Function Tests: Recent Labs  Lab 12/03/23 2156  AST 131*  ALT 74*  ALKPHOS 62  BILITOT 0.8  PROT 7.4  ALBUMIN  4.2   No results for input(s): LIPASE, AMYLASE in the last 168 hours. Recent Labs  Lab 12/04/23 0012  AMMONIA 29   Coagulation Profile: No results for input(s): INR, PROTIME in the last 168 hours. Cardiac Enzymes: No results for input(s): CKTOTAL, CKMB, CKMBINDEX, TROPONINI in the last 168 hours. BNP (last 3 results) No results for input(s): PROBNP in the last 8760 hours. HbA1C: No results for input(s): HGBA1C in the last 72 hours. CBG: No results for input(s): GLUCAP in the last 168 hours. Lipid Profile: No results for input(s): CHOL, HDL, LDLCALC, TRIG, CHOLHDL, LDLDIRECT in the last 72 hours. Thyroid  Function Tests: No results for input(s): TSH, T4TOTAL, FREET4, T3FREE, THYROIDAB in the last 72 hours. Anemia Panel: No results for input(s): VITAMINB12, FOLATE, FERRITIN, TIBC, IRON, RETICCTPCT in the last 72 hours. Urine analysis:    Component Value Date/Time   COLORURINE YELLOW 03/02/2021 0300   APPEARANCEUR CLEAR 03/02/2021 0300   LABSPEC 1.025 03/02/2021 0300   PHURINE 5.5 03/02/2021 0300   GLUCOSEU NEGATIVE 03/02/2021 0300   HGBUR NEGATIVE 03/02/2021 0300  BILIRUBINUR NEGATIVE 03/02/2021 0300   KETONESUR NEGATIVE 03/02/2021 0300   PROTEINUR NEGATIVE 03/02/2021 0300   UROBILINOGEN 0.2 01/28/2013 1204   NITRITE NEGATIVE 03/02/2021 0300   LEUKOCYTESUR NEGATIVE 03/02/2021 0300    Radiological Exams on Admission: CT ABDOMEN PELVIS WO  CONTRAST Result Date: 12/04/2023 EXAM: CT ABDOMEN AND PELVIS WITHOUT CONTRAST 12/04/2023 12:18:19 AM TECHNIQUE: CT of the abdomen and pelvis was performed without the administration of intravenous contrast. Multiplanar reformatted images are provided for review. Automated exposure control, iterative reconstruction, and/or weight-based adjustment of the mA/kV was utilized to reduce the radiation dose to as low as reasonably achievable. COMPARISON: CT 03/01/2021. CLINICAL HISTORY: elevated LFTs fatigue FINDINGS: LIMITATIONS/ARTIFACTS: Lack of IV or oral contrast and paucity of intraabdominal fat limits assessment. LOWER CHEST: Chronic interstitial lung disease in the lower lungs. Small right pleural effusion. LIVER: The liver is unremarkable. GALLBLADDER AND BILE DUCTS: Gallbladder is unremarkable. No biliary ductal dilatation. SPLEEN: No acute abnormality. PANCREAS: No acute abnormality. ADRENAL GLANDS: No acute abnormality. KIDNEYS, URETERS AND BLADDER: No stones in the kidneys or ureters. No hydronephrosis. No perinephric or periureteral stranding. Urinary bladder is unremarkable. GI AND BOWEL: Moderate hiatal hernia. Colonic diverticulosis without diverticulitis. There is no bowel obstruction. PERITONEUM AND RETROPERITONEUM: No ascites. No free air. VASCULATURE: Aorta is normal in caliber. Aortic atherosclerotic calcification. LYMPH NODES: No lymphadenopathy. REPRODUCTIVE ORGANS: No acute abnormality. 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 Chronic bilateral L5 pars defects with grade 1 spondylolisthesis of L5. No focal soft tissue abnormality. IMPRESSION: 1. No definite acute findings in the abdomen or pelvis. 2. Small right pleural effusion. 3. Moderate hiatal hernia. Electronically signed by: Norman Gatlin MD 12/04/2023 12:29 AM EST RP Workstation: [id number]   CT HEAD WO CONTRAST Result Date: 12/04/2023 EXAM: CT HEAD WITHOUT CONTRAST 12/04/2023 12:18:19 AM TECHNIQUE: CT of the  head was performed without the administration of intravenous contrast. Automated exposure control, iterative reconstruction, and/or weight based adjustment of the mA/kV was utilized to reduce the radiation dose to as low as reasonably achievable. COMPARISON: MRI head 2 / 9 / 21. CLINICAL HISTORY: ams FINDINGS: BRAIN AND VENTRICLES: No acute hemorrhage. No evidence of acute infarct. No hydrocephalus. No extra-axial collection. No mass effect or midline shift. Chronic severe white matter changes and generalized volume loss. Scattered calcified atherosclerosis. ORBITS: Lens extraction. SINUSES: No acute abnormality. SOFT TISSUES AND SKULL: No acute soft tissue abnormality. No skull fracture. IMPRESSION: 1. No acute intracranial abnormality. Electronically signed by: Norman Gatlin MD 12/04/2023 12:23 AM EST RP Workstation: [id number]   US  Abdomen Limited RUQ (LIVER/GB) Result Date: 12/04/2023 CLINICAL DATA:  Elevated LFT EXAM: ULTRASOUND ABDOMEN LIMITED RIGHT UPPER QUADRANT COMPARISON:  None Available. FINDINGS: Gallbladder: No gallstones or wall thickening visualized. No sonographic Murphy sign noted by sonographer. Common bile duct: Diameter: 2.9 mm Liver: No focal lesion identified. Within normal limits in parenchymal echogenicity. Portal vein is patent on color Doppler imaging with normal direction of blood flow towards the liver. Other: Possible fluid-filled bowel in the left upper quadrant. IMPRESSION: 1. Negative right upper quadrant ultrasound Electronically Signed   By: Luke Bun M.D.   On: 12/04/2023 00:09   DG Chest Port 1 View Result Date: 12/03/2023 CLINICAL DATA:  Altered level of consciousness EXAM: PORTABLE CHEST 1 VIEW COMPARISON:  09/03/2023 FINDINGS: Single frontal view of the chest demonstrates stable postsurgical changes from CABG. The cardiac silhouette is unremarkable. There is chronic background interstitial prominence, with patchy areas of airspace disease throughout the right lung as  well as at the left lung base. No effusion or pneumothorax. No acute bony abnormalities. Colonic interposition right upper quadrant abdomen. IMPRESSION: 1. Patchy bilateral airspace disease superimposed upon background scarring. Electronically Signed   By: Ozell Daring M.D.   On: 12/03/2023 22:18    Assessment and Plan  Acute metabolic encephalopathy Not hypoglycemic.  Ammonia level normal.  Ethanol level <15.  No fever or leukocytosis to suggest meningitis.  CT head showing no acute intracranial abnormality.  Hypercapnia could be contributing but it seems patient has been on BiPAP for the past few hours and still very somnolent.  UDS positive for opiates and he does take hydrocodone  at home but according to his son patient takes opiates only on occasion and his brother might have given the patient one dose yesterday.  Patient does open his eyes on command and does not have pinpoint pupils on exam.  Check TSH and B12 levels.  EEG ordered.  Repeat VBG ordered to check pCO2 level.  UA not suggestive of infection, will add on urine culture to confirm.  Avoid opiates/benzodiazepines and any other sedating medications.  Acute hypoxemic hypercapnic respiratory failure Possible community-acquired pneumonia Patient arrived to the ED on 15 L nonrebreather.  Initial VBG with pH 7.30 and pCO2 68.9.  He was placed on BiPAP in the ED.  Chest x-ray showing patchy bilateral airspace disease superimposed on background scarring.  No fever or leukocytosis.  COVID/influenza/RSV PCR negative.  Continue ceftriaxone  and azithromycin  for now and check procalcitonin level.  Full 20 pathogen respiratory viral panel ordered.  Gentle IV fluid hydration as patient is currently too somnolent to tolerate p.o. intake.  Continue BiPAP and repeat VBG ordered as above.  Elevated troponin History of CAD status post CABG Troponin 917 and EKG without acute ischemic changes.  Cardiology evaluated the patient and felt that troponin  elevation is likely secondary to demand ischemia rather than ACS.  Recommended continuing medical management for now and felt that the risk of cardiac catheterization at his age outweighed any benefits at this time.  Continue heparin  drip for 24 to 48 hours.  Continue aspirin daily.  TTE ordered and trend troponin.  Mild hyperkalemia CMP showing potassium 5.9 but sample possibly hemolyzed.  I-STAT chemistry showing potassium 5.5.  Does not appear to be on any medications which would cause this.  Repeat labs ordered to check potassium level and treat accordingly.  Elevated transaminases AST 131, ALT 74 (transaminases elevated on previous labs done in February 2023 as well and no significant change), alk phos and T. bili normal.  Negative right upper quadrant abdominal ultrasound.  Monitor LFTs.  Chronic HFrEF Last echo done in February 2023 showing EF 35 to 40%, grade 1 diastolic dysfunction, mild mitral regurgitation, and mild to moderate aortic regurgitation.  CT showing small right pleural effusion.  Cardiology recommending holding Lasix  at this time.  Check BNP.  CKD stage IIIb Creatinine at baseline, monitor labs.  Paroxysmal A-fib Currently in sinus rhythm.  Holding home Eliquis  at this time as patient is on heparin  drip.  Hypertension Hyperlipidemia Gout GERD/PUD Patient is currently too somnolent to be able to take p.o. meds.  DVT prophylaxis: IV heparin  gtt Code Status: DNR/DNI (I have confirmed with the patient's son [name] at bedside) Level of care: Progressive Care Unit Admission status: It is my clinical opinion that referral for OBSERVATION is reasonable and necessary in this patient based on the above information provided. The aforementioned taken together are felt to place the patient  at high risk for further clinical deterioration. However, it is anticipated that the patient may be medically stable for discharge from the hospital within 24 to 48 hours.  Editha Ram  MD [facility]  If 7PM-7AM, please contact night-coverage [email or web address]  12/04/2023, 1:05 AM

## 2023-12-04 NOTE — ED Notes (Signed)
 Angel Cooper [phone]. Asking for updates/room change

## 2023-12-04 NOTE — ED Notes (Signed)
 Lab called to report that blue tube collected by previous RN did not requirements. This RN collected a new blue top tube and sent it to lab.

## 2023-12-04 NOTE — Progress Notes (Signed)
 PHARMACY - ANTICOAGULATION CONSULT NOTE  Pharmacy Consult for heparin  Indication: chest pain/ACS  Allergies  Allergen Reactions   Aspirin Other (See Comments)    Stomach pain    Chlorhexidine Other (See Comments)    CAUSED SKIN IRRITATION AND BURNING SENSATION   Enalapril Other (See Comments)    Cannot take due to kidney compromise   Gabapentin Other (See Comments)    Caused nightmares   Statins Other (See Comments)    Myalgia    Patient Measurements: Height: 5' 7 (170.2 cm) Weight: 54 kg (119 lb 0.8 oz) IBW/kg (Calculated) : 66.1 HEPARIN  DW (KG): 54  Vital Signs: Temp: 98.3 F (36.8 C) (11/06 2113) Temp Source: Axillary (11/06 2113) BP: 111/71 (11/07 0015) Pulse Rate: 111 (11/07 0015)  Labs: Recent Labs    12/03/23 2156 12/03/23 2159  HGB 16.2 16.7  16.3  HCT 53.8* 49.0  48.0  PLT 262  --   CREATININE 1.51* 1.60*  TROPONINIHS 917*  --     Estimated Creatinine Clearance: 23 mL/min (A) (by C-G formula based on SCr of 1.6 mg/dL (H)).   Medical History: Past Medical History:  Diagnosis Date   Atrial fibrillation (HCC)    2015  W/ RVR   Bladder tumor    CHF (congestive heart failure) (HCC) 2015   CKD (chronic kidney disease), stage III (HCC)    Coronary artery disease    NO LONGER SEES CARDIOLOGIST - LAST SAW DR. 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[facility]) urologist-  dr herrick/  oncologist- dr amadeo   dx 2014  s/p  right nephroureterectomy 01-24-2013 (T3, N0) invasive high grade  urothelial carcinoma--     Assessment: 89yo male c/o fatigue, found in ED w/ AMS and hypoxia, during w/u troponin found to be elevated >> to begin heparin ; pt has been on DOAC in the past for PAF but stopped in 2023 and expressed to outpt cards that he is not interested in resuming.  Goal of Therapy:  Heparin  level 0.3-0.7 units/ml Monitor platelets by anticoagulation protocol: Yes   Plan:  Heparin  2000 units IV bolus followed by infusion at 650 units/hr. Monitor heparin  levels and CBC.  Marvetta Dauphin, PharmD, BCPS  12/04/2023,12:28 AM

## 2023-12-04 NOTE — ED Provider Notes (Signed)
 I spoke with Dr. Vonda of cardiology service who has seen the patient and recommends heparin  and trending troponin, they will follow but does not feel that any aggressive measures would be warranted.  I have discussed the case with Dr. Alfornia of [facility], who agrees to admit the patient.   Raford Lenis, MD 12/04/23 [phone]

## 2023-12-04 NOTE — Progress Notes (Signed)
  Echocardiogram 2D Echocardiogram has been performed.  Koleen KANDICE Popper, RDCS 12/04/2023, 11:07 AM

## 2023-12-04 NOTE — Progress Notes (Signed)
 EEG complete - results pending

## 2023-12-04 NOTE — ED Notes (Signed)
 CCMD called to report patient transfer to 3M15.

## 2023-12-04 NOTE — Progress Notes (Signed)
 Heart Failure Navigator Progress Note  Assessed for Heart & Vascular TOC clinic readiness.  Patient does not meet criteria due to  per cardiology they do not think in clinical Heart Failure - more infectious or PNA than cardiac. No HF TOC  Navigator will sign off at this time.   Stephane Haddock, BSN, Scientist, Clinical (histocompatibility And Immunogenetics) Only

## 2023-12-04 NOTE — Procedures (Signed)
 Patient Name: Angel Cooper  MRN: [id number]  Epilepsy Attending: Arlin MALVA Krebs  Referring Physician/Provider: Alfornia Madison, MD  Date: 12/04/2023 Duration: 25.34 mins  Patient history: 89yo M with ams. EEG to evaluate for seizure  Level of alertness: Awake  AEDs during EEG study: None  Technical aspects: This EEG study was done with scalp electrodes positioned according to the 10-20 International system of electrode placement. Electrical activity was reviewed with band pass filter of 1-70Hz , sensitivity of 7 uV/mm, display speed of 84mm/sec with a 60Hz  notched filter applied as appropriate. EEG data were recorded continuously and digitally stored.  Video monitoring was available and reviewed as appropriate.  Description: EEG showed continuous generalized 3 to 6 Hz theta-delta slowing. Hyperventilation and photic stimulation were not performed.     ABNORMALITY - Continuous slow, generalized  IMPRESSION: This study is suggestive of generalized cerebral dysfunction  (encephalopathy). No seizures or epileptiform discharges were seen throughout the recording.  Christinna Sprung O Nica Friske

## 2023-12-04 NOTE — Progress Notes (Signed)
   12/04/23 1434  Oxygen Therapy/Pulse Ox  O2 Device (S)  HFNC  SpO2 100 %  O2 Therapy Oxygen humidified  O2 Flow Rate (L/min) 5 L/min    Pt was taken off of BIPAP and placed on 5L [place]. Pt has no respiratory distress and is able to answer questions appropriately. Pt is tolerating well at this time. RT will monitor.

## 2023-12-04 NOTE — Progress Notes (Signed)
 PHARMACY - ANTICOAGULATION CONSULT NOTE  Pharmacy Consult for heparin  Indication: chest pain/ACS  Allergies  Allergen Reactions   Aspirin Other (See Comments)    Stomach pain    Chlorhexidine Other (See Comments)    CAUSED SKIN IRRITATION AND BURNING SENSATION   Enalapril Other (See Comments)    Cannot take due to kidney compromise   Gabapentin Other (See Comments)    Caused nightmares   Statins Other (See Comments)    Myalgia    Patient Measurements: Height: 5' 7 (170.2 cm) Weight: 54 kg (119 lb 0.8 oz) IBW/kg (Calculated) : 66.1 HEPARIN  DW (KG): 54  Vital Signs: Temp: 98.6 F (37 C) (11/07 0625) Temp Source: Rectal (11/07 0625) BP: 139/60 (11/07 0625) Pulse Rate: 90 (11/07 0625)  Labs: Recent Labs    12/03/23 2156 12/03/23 2159 12/04/23 0510 12/04/23 0715 12/04/23 0750  HGB 16.2 16.7  16.3  --   --   --   HCT 53.8* 49.0  48.0  --   --   --   PLT 262  --   --   --   --   HEPARINUNFRC  --   --   --   --  0.11*  CREATININE 1.51* 1.60*  --  1.80*  --   TROPONINIHS 917*  --  4,552*  --   --     Estimated Creatinine Clearance: 20.4 mL/min (A) (by C-G formula based on SCr of 1.8 mg/dL (H)).   Medical History: Past Medical History:  Diagnosis Date   Atrial fibrillation (HCC)    2015  W/ RVR   Bladder tumor    CHF (congestive heart failure) (HCC) 2015   CKD (chronic kidney disease), stage III (HCC)    Coronary artery disease    NO LONGER SEES CARDIOLOGIST - LAST SAW DR. HILARIO SHARPS 2 OR MORE YRS AGO-PT SEES PCP DR NORLEEN LEWIS   GERD (gastroesophageal reflux disease)    Hiatal hernia    History of colon polyps    History of gastric ulcer    1989  peptic and duodenal   History of MI (myocardial infarction)    1995   History of non-ST elevation myocardial infarction (NSTEMI)    07-05-1999  s/p cabg   Hyperlipidemia    Hypertension    OA (osteoarthritis)    Osteomyelitis (HCC) 12/07/2018   S/P CABG (coronary artery bypass graft)    06/ 2001   Ureteral  carcinoma, right [facility]) urologist-  dr herrick/  oncologist- dr amadeo   dx 2014  s/p  right nephroureterectomy 01-24-2013 (T3, N0) invasive high grade urothelial carcinoma--     Assessment: 89yo male c/o fatigue, found in ED w/ AMS and hypoxia, during w/u troponin found to be elevated >> to begin heparin ; pt has been on DOAC in the past for PAF but stopped in 2023 and expressed to outpt cards that he is not interested in resuming.  Heparin  level subtherapeutic at 0.11. CBC stable. Will bolus and increase. Repeat HL in 8h.   Goal of Therapy:  Heparin  level 0.3-0.7 units/ml Monitor platelets by anticoagulation protocol: Yes   Plan:  Heparin  1500 units IV bolus then increase infusion to 850 units/hr. Heparin  level in 8h and daily; CBC daily.  Elma Fail, PharmD PGY1 Clinical Pharmacist [facility]  12/04/2023 9:04 AM

## 2023-12-04 NOTE — ED Notes (Signed)
 Unable to collect urine sample, private message sent to ER MD and Charge RN

## 2023-12-04 NOTE — Progress Notes (Signed)
 PHARMACY - ANTICOAGULATION CONSULT NOTE  Pharmacy Consult for heparin  Indication: chest pain/ACS  Allergies  Allergen Reactions   Aspirin Other (See Comments)    Stomach pain    Chlorhexidine Other (See Comments)    CAUSED SKIN IRRITATION AND BURNING SENSATION   Enalapril Other (See Comments)    Cannot take due to kidney compromise   Gabapentin Other (See Comments)    Caused nightmares   Statins Other (See Comments)    Myalgia    Patient Measurements: Height: 5' 7 (170.2 cm) Weight: 41.6 kg (91 lb 11.4 oz) IBW/kg (Calculated) : 66.1 HEPARIN  DW (KG): 41.6  Vital Signs: Temp: 98 F (36.7 C) (11/07 1950) Temp Source: Oral (11/07 1950) BP: 122/48 (11/07 1958) Pulse Rate: 88 (11/07 1950)  Labs: Recent Labs    12/03/23 2156 12/03/23 2159 12/04/23 0510 12/04/23 0715 12/04/23 0750 12/04/23 2142  HGB 16.2 16.7  16.3  --   --   --   --   HCT 53.8* 49.0  48.0  --   --   --   --   PLT 262  --   --   --   --   --   HEPARINUNFRC  --   --   --   --  0.11* 0.30  CREATININE 1.51* 1.60*  --  1.80*  --   --   TROPONINIHS 917*  --  4,552*  --   --   --     Estimated Creatinine Clearance (by C-G formula based on SCr of 1.8 mg/dL (H)) Male: 86.5 mL/min (A) Male: 15.7 mL/min (A)   Medical History: Past Medical History:  Diagnosis Date   Atrial fibrillation (HCC)    2015  W/ RVR   Bladder tumor    CHF (congestive heart failure) (HCC) 2015   CKD (chronic kidney disease), stage III (HCC)    Coronary artery disease    NO LONGER SEES CARDIOLOGIST - LAST SAW DR. HILARIO SHARPS 2 OR MORE YRS AGO-PT SEES PCP DR NORLEEN GRIFFIN   GERD (gastroesophageal reflux disease)    Hiatal hernia    History of colon polyps    History of gastric ulcer    1989  peptic and duodenal   History of MI (myocardial infarction)    1995   History of non-ST elevation myocardial infarction (NSTEMI)    07-05-1999  s/p cabg   Hyperlipidemia    Hypertension    OA (osteoarthritis)    Osteomyelitis (HCC)  12/07/2018   S/P CABG (coronary artery bypass graft)    06/ 2001   Ureteral carcinoma, right [facility]) urologist-  dr herrick/  oncologist- dr amadeo   dx 2014  s/p  right nephroureterectomy 01-24-2013 (T3, N0) invasive high grade urothelial carcinoma--     Assessment: 89yo male c/o fatigue, found in ED w/ AMS and hypoxia, during w/u troponin found to be elevated >> to begin heparin ; pt has been on DOAC in the past for PAF but stopped in 2023 and expressed to outpt cards that he is not interested in resuming.  Heparin  level therapeutic and low normal; no bleeding noted.  RN reports patient has been bending his arm, causing the pump to stop throughout the night.  RN consulting IV team for a new line.  Goal of Therapy:  Heparin  level 0.3-0.7 units/ml Monitor platelets by anticoagulation protocol: Yes   Plan:  Continue heparin  infusion at 850 units/hr F/U AM labs  Angel Cooper D. [place], PharmD, BCPS, BCCCP 12/04/2023, 10:31 PM

## 2023-12-04 NOTE — Progress Notes (Addendum)
 PROGRESS NOTE    Angel Cooper  FMW:[id number] DOB: 30-May-1930 DOA: 12/03/2023 PCP: Charlott Dorn LABOR, MD   Brief Narrative:  HPI: Angel Cooper is a 89 y.o. male with medical history significant of with history of CKD stage IIIb, A-fib on Eliquis , HFrEF, CAD status post CABG, hypertension, hyperlipidemia, gout, GERD, PUD, ureteral carcinoma status post right nephroureterectomy in 2014 presented to the ED via EMS for evaluation of altered mental status and generalized weakness.  Patient arrived to the ED on 15 L oxygen via nonrebreather.  Tachycardic on arrival but afebrile and not hypotensive.  CBC showing no leukocytosis.  CMP showing potassium 5.9 (sample possibly hemolyzed), glucose 113, creatinine 1.5 (previously 1.3-1.6 on labs done in February 2023), AST 131, ALT 74 (transaminases elevated on previous labs done in February 2023 as well and no significant change), alk phos and T. bili normal.  I-STAT chemistry showing potassium 5.5.  UDS positive for opiates.  UA not suggestive of infection.  VBG with pH 7.30 and pCO2 68.9.  COVID/influenza/RSV PCR negative.  Ethanol level <15.  Ammonia level normal.  Troponin 917.  EKG showing sinus tachycardia and no acute ischemic changes.  Chest x-ray showing patchy bilateral airspace disease superimposed on background scarring.  Negative right upper quadrant abdominal ultrasound.  CT abdomen pelvis showing no definite acute findings in the abdomen or pelvis, small right pleural effusion, and moderate hiatal hernia.  CT head showing no acute intracranial abnormality.  Patient was given aspirin, ceftriaxone , azithromycin , 500 mL IV fluids, and started on heparin  drip.  He was placed on bipap. ED physician confirmed CODE STATUS with the patient's sons and patient is DNR/DNI.  Cardiology evaluated the patient and felt that troponin elevation is likely secondary to demand ischemia rather than ACS.  Recommended continuing medical management for now and felt  that the risk of cardiac catheterization at his age outweighed any benefits at this time.    Patient is currently on BiPAP and very somnolent.  He opens his eyes but otherwise not able to answer any questions and does not follow commands.  History provided by his son Angel Cooper at bedside who states patient has been very somnolent and lethargic for the past 2 days and staying mostly in bed but was still coherent.  Since yesterday family has noticed confusion.  No focal weakness reported.  No history of previous stroke or seizures reported.  Son states patient's wife is currently in the hospital with pneumonia and he started getting sick at approximately the same time.  He has not had any fevers at home.  Son states patient has chronic abdominal pain and history of bowel obstruction in the past.  He has not had any bowel movements for the past 5 days but has not vomited.  He does take hydrocodone  on occasion for chronic pain.  Son states patient has not complained of any chest pain.  States normally patient is oriented x 4 with only occasional mild confusion and is able to ambulate with a walker and feeds himself.  Assessment & Plan:   Principal Problem:   Acute metabolic encephalopathy Active Problems:   Coronary artery disease   Chronic kidney disease, stage 3b (HCC)   Acute respiratory failure with hypoxia and hypercapnia (HCC)   Elevated troponin  Acute metabolic encephalopathy, POA: Not hypoglycemic.  Ammonia level normal.  Ethanol level <15.  No fever or leukocytosis to suggest meningitis.  CT head showing no acute intracranial abnormality.  UDS positive for opiates and he  does take hydrocodone  at home but according to his son patient takes opiates only on occasion and his brother might have given the patient one dose yesterday.  TSH slightly elevated 4.6.  Will check B12 levels.  EEG negative for epileptiform activity.  VBG shows slightly elevated pCO2 yesterday and much more elevated today,  indicating the likely etiology of encephalopathy.     Acute hypoxemic hypercapnic respiratory failure and respiratory failure secondary to presumed possible community-acquired pneumonia, POA: Patient arrived to the ED on 15 L nonrebreather.  Initial VBG with pH 7.30 and pCO2 68.9.  He was placed on BiPAP in the ED.  Chest x-ray showing patchy bilateral airspace disease superimposed on background scarring.  No fever or leukocytosis.  COVID/influenza/RSV PCR negative.  Started on ceftriaxone  and azithromycin , full 20 pathogen respiratory viral panel pending.  Repeat VBG shows worsening CO2 and pH.  Plan to continue BiPAP for few more hours and repeat ABGs again.   History of CAD status post CABG/elevated troponin Troponin 917> 4552 and EKG without acute ischemic changes.  Cardiology evaluated the patient and felt that troponin elevation is likely secondary to demand ischemia rather than ACS.  Recommended continuing medical management for now and felt that the risk of cardiac catheterization at his age outweighed any benefits at this time.  Continue heparin  drip for 24 to 48 hours.  Continue aspirin daily.  TTE pending.   Mild hyperkalemia 5.9 yesterday, did not receive any treatment, 5.8 this morning.  Starting on Lokelma, monitor on telemetry, repeat potassium levels at 5 PM today.   Elevated transaminases AST 131, ALT 74 (transaminases elevated on previous labs done in February 2023 as well and no significant change), alk phos and T. bili normal.  Negative right upper quadrant abdominal ultrasound.  Monitor LFTs which are improving.   Chronic combined systolic and diastolic dysfunction: Last echo done in February 2023 showing EF 35 to 40%, grade 1 diastolic dysfunction, mild mitral regurgitation, and mild to moderate aortic regurgitation.  CT showing small right pleural effusion.  Cardiology recommending holding Lasix  at this time.  Defer management to cardiology.   CKD stage IIIb Baseline  creatinine appears to be around 1.6.  Currently 1.8, close to baseline.    Paroxysmal A-fib Currently in sinus rhythm.  Holding home Eliquis  at this time as patient is on heparin  drip.   Hypertension Hyperlipidemia Gout GERD/PUD Patient is currently too somnolent to be able to take p.o. meds.  Blood pressure stable.  DVT prophylaxis:    Code Status: Limited: Do not attempt resuscitation (DNR) -DNR-LIMITED -Do Not Intubate/DNI   Family Communication: Daughter-in-law present at bedside.  Plan of care discussed with her.  Status is: Observation The patient will require care spanning > 2 midnights and should be moved to inpatient because: Patient encephalopathic.   Estimated body mass index is 18.65 kg/m as calculated from the following:   Height as of this encounter: 5' 7 (1.702 m).   Weight as of this encounter: 54 kg.    Nutritional Assessment: Body mass index is 18.65 kg/m.[name] Seen by dietician.  I agree with the assessment and plan as outlined below: Nutrition Status:        . Skin Assessment: I have examined the patient's skin and I agree with the wound assessment as performed by the wound care RN as outlined below:    Consultants:  Cardiology  Procedures:  As above  Antimicrobials:  Anti-infectives (From admission, onward)    Start  Dose/Rate Route Frequency Ordered Stop   12/05/23 0000  azithromycin  (ZITHROMAX ) 500 mg in sodium chloride  0.9 % 250 mL IVPB        500 mg 250 mL/hr over 60 Minutes Intravenous Every 24 hours 12/04/23 0543     12/04/23 2200  cefTRIAXone  (ROCEPHIN ) 1 g in sodium chloride  0.9 % 100 mL IVPB        1 g 200 mL/hr over 30 Minutes Intravenous Every 24 hours 12/04/23 0543     12/03/23 2245  cefTRIAXone  (ROCEPHIN ) 2 g in sodium chloride  0.9 % 100 mL IVPB        2 g 200 mL/hr over 30 Minutes Intravenous  Once 12/03/23 2232 12/03/23 2329   12/03/23 2245  azithromycin  (ZITHROMAX ) 500 mg in sodium chloride  0.9 % 250 mL IVPB        500  mg 250 mL/hr over 60 Minutes Intravenous  Once 12/03/23 2232 12/04/23 0040         Subjective: Patient seen and examined, daughter-in-law at the bedside.  Patient on BiPAP, obtunded, lethargic and not communicating or having any conversation.  Probably altered as well.  Objective: Vitals:   12/04/23 0327 12/04/23 0400 12/04/23 0625 12/04/23 0720  BP:  122/63 139/60   Pulse:  85 90   Resp: (!) 29 20 20    Temp:   98.6 F (37 C)   TempSrc:   Rectal   SpO2:  100% 100% 100%  Weight:      Height:       No intake or output data in the 24 hours ending 12/04/23 0924 Filed Weights   12/03/23 2119  Weight: 54 kg    Examination:  General exam: Appears attended Respiratory system: Clear to auscultation.  Diminished breath sounds. Cardiovascular system: S1 & S2 heard, RRR. No JVD, murmurs, rubs, gallops or clicks. No pedal edema. Gastrointestinal system: Abdomen is nondistended, soft and nontender. No organomegaly or masses felt. Normal bowel sounds heard. Central nervous system: Obtunded, not oriented.   Data Reviewed: I have personally reviewed following labs and imaging studies  CBC: Recent Labs  Lab 12/03/23 2156 12/03/23 2159  WBC 10.2  --   NEUTROABS 8.4*  --   HGB 16.2 16.7  16.3  HCT 53.8* 49.0  48.0  MCV 94.9  --   PLT 262  --    Basic Metabolic Panel: Recent Labs  Lab 12/03/23 2156 12/03/23 2159 12/04/23 0715  NA 141 138  138 141  K 5.9* 5.5*  5.6* 5.8*  CL 98 100 100  CO2 29  --  29  GLUCOSE 113* 114* 81  BUN 44* 52* 47*  CREATININE 1.51* 1.60* 1.80*  CALCIUM  9.0  --  8.5*   GFR: Estimated Creatinine Clearance: 20.4 mL/min (A) (by C-G formula based on SCr of 1.8 mg/dL (H)). Liver Function Tests: Recent Labs  Lab 12/03/23 2156 12/04/23 0715  AST 131* 106*  ALT 74* 57*  ALKPHOS 62 54  BILITOT 0.8 0.9  PROT 7.4 6.2*  ALBUMIN  4.2 3.5   No results for input(s): LIPASE, AMYLASE in the last 168 hours. Recent Labs  Lab 12/04/23 0012  12/04/23 0713  AMMONIA 29 32   Coagulation Profile: No results for input(s): INR, PROTIME in the last 168 hours. Cardiac Enzymes: No results for input(s): CKTOTAL, CKMB, CKMBINDEX, TROPONINI in the last 168 hours. BNP (last 3 results) No results for input(s): PROBNP in the last 8760 hours. HbA1C: No results for input(s): HGBA1C in the last 72 hours. CBG:  No results for input(s): GLUCAP in the last 168 hours. Lipid Profile: No results for input(s): CHOL, HDL, LDLCALC, TRIG, CHOLHDL, LDLDIRECT in the last 72 hours. Thyroid  Function Tests: Recent Labs    12/04/23 0510  TSH 4.592*   Anemia Panel: No results for input(s): VITAMINB12, FOLATE, FERRITIN, TIBC, IRON, RETICCTPCT in the last 72 hours. Sepsis Labs: No results for input(s): PROCALCITON, LATICACIDVEN in the last 168 hours.  Recent Results (from the past 240 hours)  Blood culture (routine x 2)     Status: None (Preliminary result)   Collection Time: 12/03/23  9:41 PM   Specimen: BLOOD  Result Value Ref Range Status   Specimen Description BLOOD SITE NOT SPECIFIED  Final   Special Requests   Final    BOTTLES DRAWN AEROBIC AND ANAEROBIC Blood Culture results may not be optimal due to an inadequate volume of blood received in culture bottles   Culture   Final    NO GROWTH < 12 HOURS Performed at [facility], [address], [place], [place] [address]    Report Status PENDING  Incomplete  Blood culture (routine x 2)     Status: None (Preliminary result)   Collection Time: 12/03/23  9:51 PM   Specimen: BLOOD  Result Value Ref Range Status   Specimen Description BLOOD SITE NOT SPECIFIED  Final   Special Requests   Final    BOTTLES DRAWN AEROBIC AND ANAEROBIC Blood Culture results may not be optimal due to an inadequate volume of blood received in culture bottles   Culture   Final    NO GROWTH < 12 HOURS Performed at [facility], [address] [place],  [place] [address]    Report Status PENDING  Incomplete  Resp panel by RT-PCR (RSV, Flu A&B, Covid) Anterior Nasal Swab     Status: None   Collection Time: 12/03/23 11:19 PM   Specimen: Anterior Nasal Swab  Result Value Ref Range Status   SARS Coronavirus 2 by RT PCR NEGATIVE NEGATIVE Final   Influenza A by PCR NEGATIVE NEGATIVE Final   Influenza B by PCR NEGATIVE NEGATIVE Final    Comment: (NOTE) The Xpert Xpress SARS-CoV-2/FLU/RSV plus assay is intended as an aid in the diagnosis of influenza from Nasopharyngeal swab specimens and should not be used as a sole basis for treatment. Nasal washings and aspirates are unacceptable for Xpert Xpress SARS-CoV-2/FLU/RSV testing.  Fact Sheet for Patients: bloggercourse.com  Fact Sheet for Healthcare Providers: seriousbroker.it  This test is not yet approved or cleared by the [place]  FDA and has been authorized for detection and/or diagnosis of SARS-CoV-2 by FDA under an Emergency Use Authorization (EUA). This EUA will remain in effect (meaning this test can be used) for the duration of the COVID-19 declaration under Section 564(b)(1) of the Act, 21 U.S.C. section 360bbb-3(b)(1), unless the authorization is terminated or revoked.     Resp Syncytial Virus by PCR NEGATIVE NEGATIVE Final    Comment: (NOTE) Fact Sheet for Patients: bloggercourse.com  Fact Sheet for Healthcare Providers: seriousbroker.it  This test is not yet approved or cleared by the [place]  FDA and has been authorized for detection and/or diagnosis of SARS-CoV-2 by FDA under an Emergency Use Authorization (EUA). This EUA will remain in effect (meaning this test can be used) for the duration of the COVID-19 declaration under Section 564(b)(1) of the Act, 21 U.S.C. section 360bbb-3(b)(1), unless the authorization is terminated or revoked.  Performed at [facility],  [address], [place], [place] [address]      Radiology Studies: EEG adult Result Date: 12/04/2023 Shelton Arlin KIDD, MD     12/04/2023  8:07 AM Patient Name: JAHDEN SCHARA MRN: [id number] Epilepsy Attending: Arlin KIDD Shelton Referring Physician/Provider: Alfornia Madison, MD Date: 12/04/2023 Duration: 25.34 mins Patient history: 89yo M with ams. EEG to evaluate for seizure Level of alertness: Awake AEDs during EEG study: None Technical aspects: This EEG study was done with scalp electrodes positioned according to the 10-20 International system of electrode placement. Electrical activity was reviewed with band pass filter of 1-70Hz , sensitivity of 7 uV/mm, display speed of 38mm/sec with a 60Hz  notched filter applied as appropriate. EEG data were recorded continuously and digitally stored.  Video monitoring was available and reviewed as appropriate. Description: EEG showed continuous generalized 3 to 6 Hz theta-delta slowing. Hyperventilation and photic stimulation were not performed.   ABNORMALITY - Continuous slow, generalized IMPRESSION: This study is suggestive of generalized cerebral dysfunction  (encephalopathy). No seizures or epileptiform discharges were seen throughout the recording. Priyanka KIDD Shelton   CT ABDOMEN PELVIS WO CONTRAST Result Date: 12/04/2023 EXAM: CT ABDOMEN AND PELVIS WITHOUT CONTRAST 12/04/2023 12:18:19 AM TECHNIQUE: CT of the abdomen and pelvis was performed without the administration of intravenous contrast. Multiplanar reformatted images are provided for review. Automated exposure control, iterative reconstruction, and/or weight-based adjustment of the mA/kV was utilized to reduce the radiation dose to as low as reasonably achievable. COMPARISON: CT 03/01/2021. CLINICAL HISTORY: elevated LFTs fatigue FINDINGS: LIMITATIONS/ARTIFACTS: Lack of IV or oral contrast and paucity of intraabdominal fat limits assessment. LOWER CHEST: Chronic interstitial lung disease in the  lower lungs. Small right pleural effusion. LIVER: The liver is unremarkable. GALLBLADDER AND BILE DUCTS: Gallbladder is unremarkable. No biliary ductal dilatation. SPLEEN: No acute abnormality. PANCREAS: No acute abnormality. ADRENAL GLANDS: No acute abnormality. KIDNEYS, URETERS AND BLADDER: No stones in the kidneys or ureters. No hydronephrosis. No perinephric or periureteral stranding. Urinary bladder is unremarkable. GI AND BOWEL: Moderate hiatal hernia. Colonic diverticulosis without diverticulitis. There is no bowel obstruction. PERITONEUM AND RETROPERITONEUM: No ascites. No free air. VASCULATURE: Aorta is normal in caliber. Aortic atherosclerotic calcification. LYMPH NODES: No lymphadenopathy. REPRODUCTIVE ORGANS: No acute abnormality. 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 Chronic bilateral L5 pars defects with grade 1 spondylolisthesis of L5. No focal soft tissue abnormality. IMPRESSION: 1. No definite acute findings in the abdomen or pelvis. 2. Small right pleural effusion. 3. Moderate hiatal hernia. Electronically signed by: Norman Gatlin MD 12/04/2023 12:29 AM EST RP Workstation: [id number]   CT HEAD WO CONTRAST Result Date: 12/04/2023 EXAM: CT HEAD WITHOUT CONTRAST 12/04/2023 12:18:19 AM TECHNIQUE: CT of the head was performed without the administration of intravenous contrast. Automated exposure control, iterative reconstruction, and/or weight based adjustment of the mA/kV was utilized to reduce the radiation dose to as low as reasonably achievable. COMPARISON: MRI head 2 / 9 / 21. CLINICAL HISTORY: ams FINDINGS: BRAIN AND VENTRICLES: No acute hemorrhage. No evidence of acute infarct. No hydrocephalus. No extra-axial collection. No mass effect or midline shift. Chronic severe white matter changes and generalized volume loss. Scattered calcified atherosclerosis. ORBITS: Lens extraction. SINUSES: No acute abnormality. SOFT TISSUES AND SKULL: No acute soft tissue  abnormality. No skull fracture. IMPRESSION: 1. No acute intracranial abnormality. Electronically signed by: Norman Gatlin MD 12/04/2023 12:23 AM EST RP Workstation: [id number]   US  Abdomen Limited RUQ (LIVER/GB) Result Date: 12/04/2023 CLINICAL DATA:  Elevated LFT EXAM: ULTRASOUND ABDOMEN LIMITED RIGHT  UPPER QUADRANT COMPARISON:  None Available. FINDINGS: Gallbladder: No gallstones or wall thickening visualized. No sonographic Murphy sign noted by sonographer. Common bile duct: Diameter: 2.9 mm Liver: No focal lesion identified. Within normal limits in parenchymal echogenicity. Portal vein is patent on color Doppler imaging with normal direction of blood flow towards the liver. Other: Possible fluid-filled bowel in the left upper quadrant. IMPRESSION: 1. Negative right upper quadrant ultrasound Electronically Signed   By: Luke Bun M.D.   On: 12/04/2023 00:09   DG Chest Port 1 View Result Date: 12/03/2023 CLINICAL DATA:  Altered level of consciousness EXAM: PORTABLE CHEST 1 VIEW COMPARISON:  09/03/2023 FINDINGS: Single frontal view of the chest demonstrates stable postsurgical changes from CABG. The cardiac silhouette is unremarkable. There is chronic background interstitial prominence, with patchy areas of airspace disease throughout the right lung as well as at the left lung base. No effusion or pneumothorax. No acute bony abnormalities. Colonic interposition right upper quadrant abdomen. IMPRESSION: 1. Patchy bilateral airspace disease superimposed upon background scarring. Electronically Signed   By: Ozell Daring M.D.   On: 12/03/2023 22:18    Scheduled Meds:  aspirin  300 mg Rectal Daily   sodium zirconium cyclosilicate  10 g Oral Once   Continuous Infusions:  sodium chloride  50 mL/hr at 12/04/23 0624   [START ON 12/05/2023] azithromycin      cefTRIAXone  (ROCEPHIN )  IV     heparin  850 Units/hr (12/04/23 0921)     LOS: 0 days   Fredia Skeeter, MD [facility]  12/04/2023, 9:24 AM    *Please note that this is a verbal dictation therefore any spelling or grammatical errors are due to the Dragon Medical One system interpretation.  Please page via Amion and do not message via secure chat for urgent patient care matters. Secure chat can be used for non urgent patient care matters.  How to contact the [facility] Attending or Consulting provider 7A - 7P or covering provider during after hours 7P -7A, for this patient?  Check the care team in [facility] and look for a) attending/consulting [facility] provider listed and b) the [facility] team listed. Page or secure chat 7A-7P. Log into [email or web address] and use [facility]'s universal password to access. If you do not have the password, please contact the hospital operator. Locate the [facility] provider you are looking for under [facility] and page to a number that you can be directly reached. If you still have difficulty reaching the provider, please page the [facility] (Director on Call) for the Hospitalists listed on amion for assistance.

## 2023-12-04 NOTE — Progress Notes (Signed)
 Cardiology attending note:  89 year old male admitted with altered mental status.  Please see Dr. Julaine complete consult note from earlier this morning.  The patient presents with respiratory failure and altered mental status, felt to have chronic combined systolic and diastolic heart failure.  High-sensitivity troponin trend suggests significant demand ischemia with high-sensitivity troponin 917 now up to 4552.  On my exam, the patient is elderly, cachectic, in no distress on BiPAP.  Heart is regular rate and rhythm with no murmur or gallop, lungs are diminished bilaterally without crackles, abdomen is soft, thin, and nontender, extremities have no edema.  EKG shows sinus tachycardia with possible age-indeterminate inferior infarct and no acute ST/T wave changes.    I spoke with the patient's son who is present at the bedside.  The patient is not a candidate for invasive cardiac evaluation.  Agree with IV heparin  for 48 hours, 2D echocardiogram, and no other cardiac testing/intervention at this point.  Has history of paroxysmal atrial fibrillation on apixaban .  This is held and he will be maintained on IV heparin  for 48 hours.  Otherwise, as per the medicine team.  Angel Cooper 12/04/2023 9:52 AM

## 2023-12-04 NOTE — Progress Notes (Signed)
 Pt transported from ED room 26 to Ultrasound, then CT and back to room 26 on SERVO BIPAP without any complications.

## 2023-12-04 NOTE — Consult Note (Signed)
 Cardiology Consultation   Patient ID: [name] MRN: [id number]; DOB: 1930-12-22  Admit date: 12/03/2023 Date of Consult: 12/04/2023  PCP:  Charlott Dorn LABOR, MD   [facility] Providers Cardiologist:  Dorn Lesches, MD        Patient Profile: Angel Cooper is a 89 y.o. 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  History of Present Illness: [name] presents with 1 day of altered mental status.  His son had taken his wife to the ER for pneumonia earlier this morning and on return noted that he was altered and not responsive.  His normal baseline is alert and oriented, does his ADLs.  Some mild cognitive impairment but otherwise is functional.  He was not able to get out of the bed due to fatigue.  No acute complaints by the patient, maybe some shortness of breath leading up to this.  No obvious chest pain, orthopnea, PND, or lower extremity edema.  Some aspects of failure to thrive and cachexia.  On arrival to the ED he was afebrile, tachycardic to the 100s, tachypneic, requiring 2 L nasal cannula to BiPAP.  Labs notable for VBG pH 7.3, pCO2 69.  K5.5, creatinine 1.6, AST 131, ALT 74, WBC 10.2, hemoglobin 16, platelets 262.  Troponin 917.  EKG with sinus tach, nonspecific ST changes.  Chest x-ray with possible pneumonia.  CT head normal, CT abdomen pelvis normal.  He was given 324 aspirin and heparin  for possible NSTEMI.  Past Medical History:  Diagnosis Date   Atrial fibrillation (HCC)    2015  W/ RVR   Bladder tumor    CHF (congestive heart failure) (HCC) 2015   CKD (chronic kidney disease), stage III (HCC)    Coronary artery disease    NO LONGER SEES CARDIOLOGIST - LAST SAW DR. 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[facility]) urologist-  dr herrick/  oncologist- dr amadeo   dx 2014  s/p  right nephroureterectomy 01-24-2013 (T3, N0) invasive high grade urothelial carcinoma--     Past Surgical History:  Procedure Laterality Date   AMPUTATION FINGER Left 12/07/2018   Procedure: Amputation Left Long Finger;  Surgeon: Camella Fallow, MD;  Location: [facility];  Service: Orthopedics;  Laterality: Left;   CARDIAC CATHETERIZATION  07/05/1999   non-q MI;  3vessel CAD w/ high grade pLAD and mLAD, subtotal ramus branch,  OM1 70-80%,  ostialCFX 50-60%,  dCFx 80%,  RI 99%,  total occluded pRCA and fills last by right-to-right collaterals,  pLM  20% narrowing;  severe inferoapical hypokinesis   CATARACT EXTRACTION W/ INTRAOCULAR LENS  IMPLANT, BILATERAL     CORONARY ARTERY BYPASS  GRAFT  06/ 2001  dr fleeta tright   CYSTOSCOPY W/ RETROGRADES Left 01/31/2016   Procedure: CYSTOSCOPY WITH RETROGRADE PYELOGRAM;  Surgeon: Morene LELON Salines, MD;  Location: [facility];  Service: Urology;  Laterality: Left;   CYSTOSCOPY WITH RETROGRADE PYELOGRAM, URETEROSCOPY AND STENT PLACEMENT Right 12/03/2012   Procedure: CYSTOSCOPY WITH RIGHT RETROGRADE PYELOGRAM,RIGHT URETERAL WASHINGS, RIGHT URETERAL BIOPSY, RIGHT URETEROSCOPY AND STENT PLACEMENT;  Surgeon: Morene LELON Salines, MD;  Location: [facility];  Service: Urology;  Laterality: Right;   CYSTOSCOPY WITH RETROGRADE PYELOGRAM, URETEROSCOPY AND STENT PLACEMENT Right 12/15/2012    Procedure: CYSTOSCOPY WITH RIGHT RETROGRADE PYELOGRAM, POSSIBLE RIGHT URETEROSCOPY AND STENT PLACEMENT AND REMOVAL OF LEFT STENT;  Surgeon: Morene LELON Salines, MD;  Location: [facility];  Service: Urology;  Laterality: Right;   CYSTOSCOPY WITH STENT PLACEMENT Right 01/24/2013   Procedure: CYSTOSCOPY WITH STENT PLACEMENT;  Surgeon: Morene LELON Salines, MD;  Location: [facility];  Service: Urology;  Laterality: Right;   I & D EXTREMITY Left 12/07/2018   Procedure: IRRIGATION AND DEBRIDEMENT EXTREMITY;  Surgeon: Camella Fallow, MD;  Location: [facility];  Service: Orthopedics;  Laterality: Left;   INCISION AND DRAINAGE ABSCESS Left 06/02/2016   Procedure: INCISION AND DRAINAGE LEFT SMALL FINGER;  Surgeon: Murrell Kuba, MD;  Location: [facility];  Service: Orthopedics;  Laterality: Left;   ROBOT ASSITED LAPAROSCOPIC NEPHROURETERECTOMY Right 01/24/2013   Procedure: ROBOT ASSISTED LAPAROSCOPIC NEPHROURETERECTOMY WITH BILATERAL LYMPH NODE DISSECTION ;  Surgeon: Morene LELON Salines, MD;  Location: [facility];  Service: Urology;  Laterality: Right;  right kidney   TRANSTHORACIC ECHOCARDIOGRAM  01/28/2013   mild LVH,  ef 45-50%, diffuse hypokinesis,  grade 1 diastolic dysfunction/  moderate LAE/  trivial PR/  mild TR, PASP 50-21mmHg   TRANSURETHRAL RESECTION OF BLADDER TUMOR N/A 01/31/2016   Procedure: TRANSURETHRAL RESECTION OF BLADDER TUMOR (TURBT);  Surgeon: Morene LELON Salines, MD;  Location: [facility];  Service: Urology;  Laterality: N/A;     Home Medications:  Prior to Admission medications   Medication Sig Start Date End Date Taking? Authorizing Provider  acetaminophen  (TYLENOL ) 500 MG tablet Take 1 tablet (500 mg total) by mouth every 4 (four) hours as needed for mild pain or headache. 03/11/21   Elgergawy, Brayton RAMAN, MD  albuterol  (PROVENTIL ) (2.5 MG/3ML) 0.083% nebulizer solution Take 3 mLs (2.5 mg total) by nebulization every 4 (four) hours as needed for shortness of breath or  wheezing. Patient not taking: Reported on 04/18/2021 03/11/21   Elgergawy, Brayton RAMAN, MD  allopurinol  (ZYLOPRIM ) 100 MG tablet Take 0.5 tablets (50 mg total) by mouth daily. Patient not taking: Reported on 06/20/2022 02/01/21   Patsy Lenis, MD  ALPRAZolam  (XANAX ) 0.25 MG tablet Take 1 tablet (0.25 mg total) by mouth at bedtime as needed for sleep. Patient not taking: Reported on 04/18/2021 03/11/21   Elgergawy, Brayton RAMAN, MD  Cyanocobalamin  (B-12 COMPLIANCE INJECTION) 1000 MCG/ML KIT Inject 1,000 mcg as directed every 30 (thirty) days.    [provider]  diltiazem  (CARDIZEM  CD) 120 MG 24 hr capsule Take 1 capsule (120 mg total) by mouth daily. Patient not taking: Reported on 06/20/2022 04/18/21   Court Dorn PARAS, MD  diltiazem  (CARDIZEM  SR) 60 MG 12 hr capsule Take 1 capsule (60 mg total) by mouth 2 (two) times daily. 06/09/22   Court Dorn PARAS, MD  ELIQUIS  2.5 MG TABS tablet TAKE ONE TABLET TWICE DAILY Patient not taking: Reported on 06/20/2022 06/13/21   Court Dorn PARAS, MD  feeding  supplement (ENSURE ENLIVE / ENSURE PLUS) LIQD Take 237 mLs by mouth 3 (three) times daily between meals. Patient not taking: Reported on 06/20/2022 03/11/21   Elgergawy, Brayton RAMAN, MD  furosemide  (LASIX ) 20 MG tablet TAKE THREE TABLETS BY MOUTH TWICE DAILY 09/25/21   Court Dorn PARAS, MD  furosemide  (LASIX ) 40 MG tablet Take 1.5 tablets (60 mg total) by mouth 2 (two) times daily. Patient not taking: Reported on 06/20/2022 03/11/21   Elgergawy, Brayton RAMAN, MD  HYDROcodone -acetaminophen  (NORCO/VICODIN) 5-325 MG tablet Take 1 tablet by mouth every 6 (six) hours as needed for severe pain. 03/11/21   Elgergawy, Brayton RAMAN, MD  midodrine  (PROAMATINE ) 5 MG tablet Take 0.5 tablets (2.5 mg total) by mouth 2 (two) times daily with a meal. Patient not taking: Reported on 04/18/2021 03/11/21   Elgergawy, Dawood S, MD  multivitamin (RENA-VIT) TABS tablet Take 1 tablet by mouth at bedtime. 03/11/21   Elgergawy, Brayton RAMAN, MD   pantoprazole  (PROTONIX ) 40 MG tablet Take 1 tablet (40 mg total) by mouth daily at 12 noon. 03/11/21   Elgergawy, Dawood S, MD  polyethylene glycol (MIRALAX  / GLYCOLAX ) 17 g packet Take 17 g by mouth daily as needed. 03/11/21   Elgergawy, Brayton RAMAN, MD  sennosides-docusate sodium  (SENOKOT-S) 8.6-50 MG tablet Take 2 tablets by mouth daily. Patient not taking: Reported on 06/20/2022    [provider]    Scheduled Meds:  Continuous Infusions:  heparin  650 Units/hr (12/04/23 0141)   PRN Meds:   Allergies:    Allergies  Allergen Reactions   Aspirin Other (See Comments)    Stomach pain    Chlorhexidine Other (See Comments)    CAUSED SKIN IRRITATION AND BURNING SENSATION   Enalapril Other (See Comments)    Cannot take due to kidney compromise   Gabapentin Other (See Comments)    Caused nightmares   Statins Other (See Comments)    Myalgia    Social History:   Social History   Socioeconomic History   Marital status: Married    Spouse name: Not on file   Number of children: Not on file   Years of education: Not on file   Highest education level: Not on file  Occupational History   Not on file  Tobacco Use   Smoking status: Former    Current packs/day: 0.00    Types: Cigarettes    Quit date: 01/27/1958    Years since quitting: 65.8   Smokeless tobacco: Never  Substance and Sexual Activity   Alcohol use: Not Currently    Comment: OCCASIONAL   Drug use: No   Sexual activity: Not Currently  Other Topics Concern   Not on file  Social History Narrative   Not on file   Social Drivers of Health   Financial Resource Strain: Not on file  Food Insecurity: Not on file  Transportation Needs: Not on file  Physical Activity: Not on file  Stress: Not on file  Social Connections: Not on file  Intimate Partner Violence: Not on file    Family History:   History reviewed. No pertinent family history.   ROS:  Please see the history of present illness.   All other ROS  reviewed and negative.     Physical Exam/Data: Vitals:   12/03/23 2119 12/03/23 2233 12/03/23 2350 12/04/23 0015  BP:  (!) 150/70  111/71  Pulse:  (!) 113 (!) 113 (!) 111  Resp:   (!) 23 17  Temp:      TempSrc:  SpO2:  100%  100%  Weight: 54 kg     Height: 5' 7 (1.702 m)      No intake or output data in the 24 hours ending 12/04/23 0147    12/03/2023    9:19 PM 06/20/2022    2:20 PM 04/18/2021    2:40 PM  Last 3 Weights  Weight (lbs) 119 lb 0.8 oz 119 lb 113 lb 9.6 oz  Weight (kg) 54 kg 53.978 kg 51.529 kg     Body mass index is 18.65 kg/m.  General: Ill-appearing, cachectic, in mild distress HEENT: normal Neck: no JVD Vascular: No carotid bruits; Distal pulses 2+ bilaterally Cardiac:  normal S1, S2; tachycardic; no AI murmur appreciated Lungs: Worse breath sounds throughout Abd: soft, nontender, no hepatomegaly  Ext: no edema Musculoskeletal:  No deformities, BUE and BLE strength normal and equal Skin: warm and dry  Neuro: Altered Psych:  Normal affect   EKG:  The EKG was personally reviewed and demonstrates: Sinus tachycardia Telemetry:  Telemetry was personally reviewed and demonstrates: Same  Relevant CV Studies: TTE 2023  IMPRESSIONS    1. Global moderate hypokinesis of the LV. Akinetic  anteroseptal/inferoseptal wall from base to the apex. Consistent with  prior CABG. Left ventricular ejection fraction, by estimation, is 35 to  40%. The left ventricle has moderately decreased function.  The left ventricle demonstrates global hypokinesis. Left ventricular  diastolic parameters are consistent with Grade I diastolic dysfunction  (impaired relaxation).   2. IVC not well visualized to estimate RVSP. Right ventricular systolic  function is normal. The right ventricular size is normal.   3. Left atrial size was severely dilated.   4. Right atrial size was moderately dilated.   5. Mild mitral valve regurgitation.   6. The aortic valve was not well  visualized. Aortic valve regurgitation  is mild to moderate. Aortic valve sclerosis/calcification is present,  without any evidence of aortic stenosis.   7. The inferior vena cava IVC not well visualized.    Laboratory Data: High Sensitivity Troponin:   Recent Labs  Lab 12/03/23 2156  TROPONINIHS 917*     Chemistry Recent Labs  Lab 12/03/23 2156 12/03/23 2159  NA 141 138  138  K 5.9* 5.5*  5.6*  CL 98 100  CO2 29  --   GLUCOSE 113* 114*  BUN 44* 52*  CREATININE 1.51* 1.60*  CALCIUM  9.0  --   GFRNONAA 43*  --   ANIONGAP 14  --     Recent Labs  Lab 12/03/23 2156  PROT 7.4  ALBUMIN  4.2  AST 131*  ALT 74*  ALKPHOS 62  BILITOT 0.8   Lipids No results for input(s): CHOL, TRIG, HDL, LABVLDL, LDLCALC, CHOLHDL in the last 168 hours.  Hematology Recent Labs  Lab 12/03/23 2156 12/03/23 2159  WBC 10.2  --   RBC 5.67  --   HGB 16.2 16.7  16.3  HCT 53.8* 49.0  48.0  MCV 94.9  --   MCH 28.6  --   MCHC 30.1  --   RDW 16.1*  --   PLT 262  --    Thyroid  No results for input(s): TSH, FREET4 in the last 168 hours.  BNPNo results for input(s): BNP, PROBNP in the last 168 hours.  DDimer No results for input(s): DDIMER in the last 168 hours.  Radiology/Studies:  CT ABDOMEN PELVIS WO CONTRAST Result Date: 12/04/2023 EXAM: CT ABDOMEN AND PELVIS WITHOUT CONTRAST 12/04/2023 12:18:19 AM TECHNIQUE: CT of the abdomen and  pelvis was performed without the administration of intravenous contrast. Multiplanar reformatted images are provided for review. Automated exposure control, iterative reconstruction, and/or weight-based adjustment of the mA/kV was utilized to reduce the radiation dose to as low as reasonably achievable. COMPARISON: CT 03/01/2021. CLINICAL HISTORY: elevated LFTs fatigue FINDINGS: LIMITATIONS/ARTIFACTS: Lack of IV or oral contrast and paucity of intraabdominal fat limits assessment. LOWER CHEST: Chronic interstitial lung disease in the lower  lungs. Small right pleural effusion. LIVER: The liver is unremarkable. GALLBLADDER AND BILE DUCTS: Gallbladder is unremarkable. No biliary ductal dilatation. SPLEEN: No acute abnormality. PANCREAS: No acute abnormality. ADRENAL GLANDS: No acute abnormality. KIDNEYS, URETERS AND BLADDER: No stones in the kidneys or ureters. No hydronephrosis. No perinephric or periureteral stranding. Urinary bladder is unremarkable. GI AND BOWEL: Moderate hiatal hernia. Colonic diverticulosis without diverticulitis. There is no bowel obstruction. PERITONEUM AND RETROPERITONEUM: No ascites. No free air. VASCULATURE: Aorta is normal in caliber. Aortic atherosclerotic calcification. LYMPH NODES: No lymphadenopathy. REPRODUCTIVE ORGANS: No acute abnormality. 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 Chronic bilateral L5 pars defects with grade 1 spondylolisthesis of L5. No focal soft tissue abnormality. IMPRESSION: 1. No definite acute findings in the abdomen or pelvis. 2. Small right pleural effusion. 3. Moderate hiatal hernia. Electronically signed by: Norman Gatlin MD 12/04/2023 12:29 AM EST RP Workstation: [id number]   CT HEAD WO CONTRAST Result Date: 12/04/2023 EXAM: CT HEAD WITHOUT CONTRAST 12/04/2023 12:18:19 AM TECHNIQUE: CT of the head was performed without the administration of intravenous contrast. Automated exposure control, iterative reconstruction, and/or weight based adjustment of the mA/kV was utilized to reduce the radiation dose to as low as reasonably achievable. COMPARISON: MRI head 2 / 9 / 21. CLINICAL HISTORY: ams FINDINGS: BRAIN AND VENTRICLES: No acute hemorrhage. No evidence of acute infarct. No hydrocephalus. No extra-axial collection. No mass effect or midline shift. Chronic severe white matter changes and generalized volume loss. Scattered calcified atherosclerosis. ORBITS: Lens extraction. SINUSES: No acute abnormality. SOFT TISSUES AND SKULL: No acute soft tissue abnormality. No  skull fracture. IMPRESSION: 1. No acute intracranial abnormality. Electronically signed by: Norman Gatlin MD 12/04/2023 12:23 AM EST RP Workstation: [id number]   US  Abdomen Limited RUQ (LIVER/GB) Result Date: 12/04/2023 CLINICAL DATA:  Elevated LFT EXAM: ULTRASOUND ABDOMEN LIMITED RIGHT UPPER QUADRANT COMPARISON:  None Available. FINDINGS: Gallbladder: No gallstones or wall thickening visualized. No sonographic Murphy sign noted by sonographer. Common bile duct: Diameter: 2.9 mm Liver: No focal lesion identified. Within normal limits in parenchymal echogenicity. Portal vein is patent on color Doppler imaging with normal direction of blood flow towards the liver. Other: Possible fluid-filled bowel in the left upper quadrant. IMPRESSION: 1. Negative right upper quadrant ultrasound Electronically Signed   By: Luke Bun M.D.   On: 12/04/2023 00:09   DG Chest Port 1 View Result Date: 12/03/2023 CLINICAL DATA:  Altered level of consciousness EXAM: PORTABLE CHEST 1 VIEW COMPARISON:  09/03/2023 FINDINGS: Single frontal view of the chest demonstrates stable postsurgical changes from CABG. The cardiac silhouette is unremarkable. There is chronic background interstitial prominence, with patchy areas of airspace disease throughout the right lung as well as at the left lung base. No effusion or pneumothorax. No acute bony abnormalities. Colonic interposition right upper quadrant abdomen. IMPRESSION: 1. Patchy bilateral airspace disease superimposed upon background scarring. Electronically Signed   By: Ozell Daring M.D.   On: 12/03/2023 22:18   Assessment and Plan: Elevated troponin secondary demand ischemia CAD status post CABG Chronic systolic and diastolic heart failure heart  failure Patient presents with encephalopathy of unknown clear etiology at this time, favor infectious and possible pneumonia.  No obvious chest pain or heart failure symptoms.  He is euvolemic on exam.  Troponin elevation is likely  secondary to demand ischemia rather than primary acute coronary syndrome.  EKG without ischemic changes.  He has been aspirin loaded and started on heparin  by the ED, we can continue medical management for now as his remaining infectious workup pends; however, I spoke with the son and think the risk of heart catheterization at his age outweighs any benefit at this time. - Status post aspirin, continue daily - heparin , will likely discontinue in 24 to 48 hours - TTE tomorrow - Trend troponin - Hold Lasix  given AKI and likely infection - Infectious workup and management per medicine  4.  Paroxysmal A-fib - Continue heparin  - Hold home Eliquis  2.5 mg twice daily  Risk Assessment/Risk Scores:         CHA2DS2-VASc Score =     This indicates a  % annual risk of stroke. The patient's score is based upon:         For questions or updates, please contact [facility] Please consult [email or web address] for contact info under     Signed, Ozell Bushman, MD  12/04/2023 1:47 AM

## 2023-12-05 DIAGNOSIS — G9341 Metabolic encephalopathy: Secondary | ICD-10-CM | POA: Diagnosis not present

## 2023-12-05 DIAGNOSIS — I214 Non-ST elevation (NSTEMI) myocardial infarction: Secondary | ICD-10-CM | POA: Diagnosis not present

## 2023-12-05 LAB — BLOOD GAS, ARTERIAL
Acid-Base Excess: 7.4 mmol/L — ABNORMAL HIGH (ref 0.0–2.0)
Bicarbonate: 35 mmol/L — ABNORMAL HIGH (ref 20.0–28.0)
O2 Saturation: 99.3 %
Patient temperature: 36.8
pCO2 arterial: 61 mmHg — ABNORMAL HIGH (ref 32–48)
pH, Arterial: 7.36 (ref 7.35–7.45)
pO2, Arterial: 124 mmHg — ABNORMAL HIGH (ref 83–108)

## 2023-12-05 LAB — COMPREHENSIVE METABOLIC PANEL WITH GFR
ALT: 39 U/L (ref 0–44)
AST: 51 U/L — ABNORMAL HIGH (ref 15–41)
Albumin: 3.1 g/dL — ABNORMAL LOW (ref 3.5–5.0)
Alkaline Phosphatase: 51 U/L (ref 38–126)
Anion gap: 12 (ref 5–15)
BUN: 44 mg/dL — ABNORMAL HIGH (ref 8–23)
CO2: 29 mmol/L (ref 22–32)
Calcium: 8.4 mg/dL — ABNORMAL LOW (ref 8.9–10.3)
Chloride: 102 mmol/L (ref 98–111)
Creatinine, Ser: 1.31 mg/dL — ABNORMAL HIGH (ref 0.61–1.24)
GFR, Estimated: 51 mL/min — ABNORMAL LOW (ref >60)
Glucose, Bld: 78 mg/dL (ref 70–99)
Potassium: 5 mmol/L (ref 3.5–5.1)
Sodium: 143 mmol/L (ref 135–145)
Total Bilirubin: 0.4 mg/dL (ref 0.0–1.2)
Total Protein: 5.6 g/dL — ABNORMAL LOW (ref 6.5–8.1)

## 2023-12-05 LAB — CBC
HCT: 40.7 % (ref 39.0–52.0)
Hemoglobin: 12.5 g/dL — ABNORMAL LOW (ref 13.0–17.0)
MCH: 28.7 pg (ref 26.0–34.0)
MCHC: 30.7 g/dL (ref 30.0–36.0)
MCV: 93.3 fL (ref 80.0–100.0)
Platelets: 218 K/uL (ref 150–400)
RBC: 4.36 MIL/uL (ref 4.22–5.81)
RDW: 16 % — ABNORMAL HIGH (ref 11.5–15.5)
WBC: 11.5 K/uL — ABNORMAL HIGH (ref 4.0–10.5)
nRBC: 0 % (ref 0.0–0.2)

## 2023-12-05 LAB — HEPARIN LEVEL (UNFRACTIONATED): Heparin Unfractionated: 0.47 [IU]/mL (ref 0.30–0.70)

## 2023-12-05 LAB — URINE CULTURE: Culture: <10000 — AB

## 2023-12-05 MED ORDER — METOPROLOL SUCCINATE ER 50 MG PO TB24
50.0000 mg | ORAL_TABLET | Freq: Every day | ORAL | Status: DC
  Administered 2023-12-05 – 2023-12-07 (×3): 50 mg via ORAL
  Filled 2023-12-05 (×3): qty 1

## 2023-12-05 MED ORDER — ACETAMINOPHEN 500 MG PO TABS
500.0000 mg | ORAL_TABLET | Freq: Three times a day (TID) | ORAL | Status: DC | PRN
  Administered 2023-12-06: 500 mg via ORAL
  Filled 2023-12-05 (×2): qty 1

## 2023-12-05 MED ORDER — ALBUTEROL SULFATE HFA 108 (90 BASE) MCG/ACT IN AERS: 1.0000 | INHALATION_SPRAY | RESPIRATORY_TRACT | Status: DC | PRN

## 2023-12-05 MED ORDER — ENSURE PLUS HIGH PROTEIN PO LIQD
237.0000 mL | Freq: Two times a day (BID) | ORAL | Status: DC
  Administered 2023-12-06 – 2023-12-07 (×3): 237 mL via ORAL

## 2023-12-05 MED ORDER — ALLOPURINOL 100 MG PO TABS: 50.0000 mg | ORAL_TABLET | Freq: Every day | ORAL | Status: DC

## 2023-12-05 MED ORDER — POLYETHYLENE GLYCOL 3350 17 G PO PACK
17.0000 g | PACK | Freq: Every day | ORAL | Status: DC | PRN
Start: 2023-12-05 — End: 2023-12-08

## 2023-12-05 MED ORDER — DILTIAZEM HCL ER 60 MG PO CP12
60.0000 mg | ORAL_CAPSULE | Freq: Two times a day (BID) | ORAL | Status: DC
Start: 2023-12-05 — End: 2023-12-05
  Filled 2023-12-05 (×2): qty 1

## 2023-12-05 MED ORDER — FUROSEMIDE 40 MG PO TABS: 60.0000 mg | ORAL_TABLET | Freq: Two times a day (BID) | ORAL | Status: DC

## 2023-12-05 MED ORDER — APIXABAN 2.5 MG PO TABS
2.5000 mg | ORAL_TABLET | Freq: Two times a day (BID) | ORAL | Status: DC
  Administered 2023-12-06 – 2023-12-07 (×3): 2.5 mg via ORAL
  Filled 2023-12-05 (×3): qty 1

## 2023-12-05 MED ORDER — ALBUTEROL SULFATE (2.5 MG/3ML) 0.083% IN NEBU: 2.5000 mg | INHALATION_SOLUTION | RESPIRATORY_TRACT | Status: DC | PRN

## 2023-12-05 MED ORDER — CYANOCOBALAMIN 1000 MCG/ML IJ KIT: 1000.0000 ug | PACK | INTRAMUSCULAR | Status: DC

## 2023-12-05 MED ORDER — PANTOPRAZOLE SODIUM 40 MG PO TBEC
40.0000 mg | DELAYED_RELEASE_TABLET | Freq: Every day | ORAL | Status: DC
  Administered 2023-12-06 – 2023-12-07 (×2): 40 mg via ORAL
  Filled 2023-12-05 (×3): qty 1

## 2023-12-05 NOTE — Evaluation (Signed)
 Physical Therapy Evaluation Patient Details Name: Angel Cooper MRN: [id number] DOB: Jan 05, 1931 Today's Date: 12/05/2023  History of Present Illness  Angel Cooper is a 89 y.o. male admitted 11/6 with confusion and hypoxia. Patient arrived to the ED on 15 L oxygen via nonrebreather.  Tachycardic on arrival but afebrile and not hypotensive.  UDS positive for opiates.    Troponin 917.  Chest x-ray showing patchy bilateral airspace disease superimposed on background scarring.   CT abdomen pelvis showing no definite acute findings in the abdomen or pelvis, small right pleural effusion CT head showing no acute intracranial abnormality.  He was placed on bipap. Cardiology evaluated the patient and felt that troponin elevation is likely secondary to demand ischemia rather than ACS.  Recommended continuing medical management for now. Acute metabolic encephalopathy suspected as well as CHF. PMH: CKD stage IIIb, A-fib on Eliquis , HFrEF, CAD status post CABG, hypertension, hyperlipidemia, gout, GERD, PUD, ureteral carcinoma status post right nephroureterectomy in 2014  Clinical Impression  Pt admitted with above diagnosis. Pt was able to sit EOB and stand to RW however needing mod assist. Could not progress ambulation due to poor postural stability.  Pt reports he and wife have been struggling for some time at home and wife is currently in hospital with PNA.  Will likely need post acute rehab < 3 hours day prior to d/c home. Will follow acutely.  Pt currently with functional limitations due to the deficits listed below (see PT Problem List). Pt will benefit from acute skilled PT to increase their independence and safety with mobility to allow discharge.           If plan is discharge home, recommend the following: A little help with walking and/or transfers;A little help with bathing/dressing/bathroom;Assistance with cooking/housework;Assist for transportation;Help with stairs or ramp for entrance   Can  travel by private vehicle   No    Equipment Recommendations None recommended by PT  Recommendations for Other Services       Functional Status Assessment Patient has had a recent decline in their functional status and demonstrates the ability to make significant improvements in function in a reasonable and predictable amount of time.     Precautions / Restrictions Precautions Precautions: Fall Restrictions Weight Bearing Restrictions Per Provider Order: No      Mobility  Bed Mobility Overal bed mobility: Needs Assistance Bed Mobility: Supine to Sit     Supine to sit: Min assist     General bed mobility comments: needed assist for LES and for trunk    Transfers Overall transfer level: Needs assistance Equipment used: Rolling walker (2 wheels) Transfers: Sit to/from Stand Sit to Stand: Mod assist, From elevated surface           General transfer comment: Needed mod assist to power up. Posterior lean noted. Needs constant support for balance with flexed posture and poor stability. Able to take a few steps to [facility].    Ambulation/Gait               General Gait Details: unable  Stairs            Wheelchair Mobility     Tilt Bed    Modified Rankin (Stroke Patients Only)       Balance Overall balance assessment: Needs assistance Sitting-balance support: No upper extremity supported, Feet supported Sitting balance-Leahy Scale: Fair   Postural control: Posterior lean Standing balance support: Bilateral upper extremity supported, During functional activity, Reliant on assistive device for  balance Standing balance-Leahy Scale: Poor Standing balance comment: relies on UE support on RW and external support                             Pertinent Vitals/Pain Pain Assessment Pain Assessment: Faces Faces Pain Scale: Hurts whole lot Pain Location: buttocks Pain Descriptors / Indicators: Aching, Grimacing, Guarding, Discomfort Pain  Intervention(s): Limited activity within patient's tolerance, Monitored during session, Repositioned    Home Living Family/patient expects to be discharged to:: Private residence Living Arrangements: Spouse/significant other Available Help at Discharge: Family;Available 24 hours/day Type of Home: House Home Access: Stairs to enter Entrance Stairs-Rails: Right Entrance Stairs-Number of Steps: 3   Home Layout: One level Home Equipment: [employer] (2 wheels);Rollator (4 wheels);Cane - single point;Shower seat (home O2 2L per pt) Additional Comments: Wife is currently in hospital with PNA    Prior Function Prior Level of Function : Independent/Modified Independent;Needs assist             Mobility Comments: used rollator most of the time per pt ADLs Comments: Ind with all ADLS per pt     Extremity/Trunk Assessment   Upper Extremity Assessment Upper Extremity Assessment: Defer to OT evaluation    Lower Extremity Assessment Lower Extremity Assessment: Generalized weakness    Cervical / Trunk Assessment Cervical / Trunk Assessment: Kyphotic  Communication   Communication Communication: No apparent difficulties    Cognition Arousal: Alert Behavior During Therapy: WFL for tasks assessed/performed   PT - Cognitive impairments: No family/caregiver present to determine baseline                         Following commands: Intact       Cueing       General Comments General comments (skin integrity, edema, etc.): Called nursing to make them aware that pt with reddened area on buttocks as pt c/o sore buttocks. Nurse stated they would come and check buttocks.  Placed buttocks Mepiplex on pts bottom as there wasnt a dressingin place.  Pt stated this felt better once he was back in bed and in chair position (lunch was delivered therefore set pt upright for lunch).  Pt on 5LO2 with VSS.    Exercises General Exercises - Lower Extremity Ankle Circles/Pumps: AROM,  Both, 10 reps, Supine Long Arc Quad: AROM, Both, 10 reps, Seated Heel Slides: AROM, Both, 10 reps, Supine   Assessment/Plan    PT Assessment Patient needs continued PT services  PT Problem List Decreased activity tolerance;Decreased balance;Decreased mobility;Decreased knowledge of use of DME;Decreased safety awareness;Decreased knowledge of precautions;Cardiopulmonary status limiting activity;Decreased skin integrity       PT Treatment Interventions DME instruction;Gait training;Functional mobility training;Therapeutic activities;Therapeutic exercise;Balance training;Patient/family education    PT Goals (Current goals can be found in the Care Plan section)  Acute Rehab PT Goals Patient Stated Goal: to get better PT Goal Formulation: With patient Time For Goal Achievement: 12/19/23 Potential to Achieve Goals: Good    Frequency Min 2X/week     Co-evaluation               AM-PAC PT 6 Clicks Mobility  Outcome Measure Help needed turning from your back to your side while in a flat bed without using bedrails?: A Little Help needed moving from lying on your back to sitting on the side of a flat bed without using bedrails?: A Lot Help needed moving to and from a bed  to a chair (including a wheelchair)?: A Lot Help needed standing up from a chair using your arms (e.g., wheelchair or bedside chair)?: A Lot Help needed to walk in hospital room?: Total Help needed climbing 3-5 steps with a railing? : Total 6 Click Score: 11    End of Session Equipment Utilized During Treatment: Gait belt;Oxygen Activity Tolerance: Patient limited by fatigue Patient left: with call bell/phone within reach;in bed;with bed alarm set (in chair position) Nurse Communication: Mobility status (nurse asked to check buttocks) PT Visit Diagnosis: Muscle weakness (generalized) (M62.81);Unsteadiness on feet (R26.81)    Time: 1137-1200 PT Time Calculation (min) (ACUTE ONLY): 23 min   Charges:   PT  Evaluation $PT Eval Moderate Complexity: 1 Mod PT Treatments $Therapeutic Activity: 8-22 mins PT General Charges $$ ACUTE PT VISIT: 1 Visit         Pierra Skora M,PT Acute Rehab Services [phone]   Stephane JULIANNA Bevel 12/05/2023, 1:50 PM

## 2023-12-05 NOTE — Progress Notes (Signed)
 PROGRESS NOTE    Angel Cooper  FMW:[id number] DOB: December 21, 1930 DOA: 12/03/2023 PCP: Charlott Dorn LABOR, MD   Brief Narrative:  HPI: Angel Cooper is a 89 y.o. male with medical history significant of with history of CKD stage IIIb, A-fib on Eliquis , HFrEF, CAD status post CABG, hypertension, hyperlipidemia, gout, GERD, PUD, ureteral carcinoma status post right nephroureterectomy in 2014 presented to the ED via EMS for evaluation of altered mental status and generalized weakness.  Patient arrived to the ED on 15 L oxygen via nonrebreather.  Tachycardic on arrival but afebrile and not hypotensive.  CBC showing no leukocytosis.  CMP showing potassium 5.9 (sample possibly hemolyzed), glucose 113, creatinine 1.5 (previously 1.3-1.6 on labs done in February 2023), AST 131, ALT 74 (transaminases elevated on previous labs done in February 2023 as well and no significant change), alk phos and T. bili normal.  I-STAT chemistry showing potassium 5.5.  UDS positive for opiates.  UA not suggestive of infection.  VBG with pH 7.30 and pCO2 68.9.  COVID/influenza/RSV PCR negative.  Ethanol level <15.  Ammonia level normal.  Troponin 917.  EKG showing sinus tachycardia and no acute ischemic changes.  Chest x-ray showing patchy bilateral airspace disease superimposed on background scarring.  Negative right upper quadrant abdominal ultrasound.  CT abdomen pelvis showing no definite acute findings in the abdomen or pelvis, small right pleural effusion, and moderate hiatal hernia.  CT head showing no acute intracranial abnormality.  Patient was given aspirin, ceftriaxone , azithromycin , 500 mL IV fluids, and started on heparin  drip.  He was placed on bipap. ED physician confirmed CODE STATUS with the patient's sons and patient is DNR/DNI.  Cardiology evaluated the patient and felt that troponin elevation is likely secondary to demand ischemia rather than ACS.  Recommended continuing medical management for now and felt  that the risk of cardiac catheterization at his age outweighed any benefits at this time.    Patient is currently on BiPAP and very somnolent.  He opens his eyes but otherwise not able to answer any questions and does not follow commands.  History provided by his son [name] at bedside who states patient has been very somnolent and lethargic for the past 2 days and staying mostly in bed but was still coherent.  Since yesterday family has noticed confusion.  No focal weakness reported.  No history of previous stroke or seizures reported.  Son states patient's wife is currently in the hospital with pneumonia and he started getting sick at approximately the same time.  He has not had any fevers at home.  Son states patient has chronic abdominal pain and history of bowel obstruction in the past.  He has not had any bowel movements for the past 5 days but has not vomited.  He does take hydrocodone  on occasion for chronic pain.  Son states patient has not complained of any chest pain.  States normally patient is oriented x 4 with only occasional mild confusion and is able to ambulate with a walker and feeds himself.  Assessment & Plan:   Principal Problem:   Acute metabolic encephalopathy Active Problems:   Coronary artery disease   Chronic kidney disease, stage 3b (HCC)   Acute respiratory failure with hypoxia and hypercapnia (HCC)   Elevated troponin   NSTEMI (non-ST elevated myocardial infarction) (HCC)  Acute metabolic encephalopathy, POA: Not hypoglycemic.  Ammonia level normal.  Ethanol level <15.  No fever or leukocytosis to suggest meningitis.  CT head showing no acute intracranial  abnormality.  UDS positive for opiates and he does take hydrocodone  at home but according to his son patient takes opiates only on occasion and his brother might have given the patient one dose yesterday.  TSH slightly elevated 4.6.  B12 normal.   EEG negative for epileptiform activity.  VBG shows slightly elevated pCO2  indicating hypercapnia is likely source of acute encephalopathy.  Patient has been weaned to room air, currently on 5 L, fully alert and oriented.  Son at the bedside.   Acute hypoxemic hypercapnic respiratory failure and respiratory failure secondary to presumed possible community-acquired pneumonia, POA: Patient arrived to the ED on 15 L nonrebreather.  Initial VBG with pH 7.30 and pCO2 68.9.  He was placed on BiPAP in the ED.  Chest x-ray showing patchy bilateral airspace disease superimposed on background scarring.  No fever or leukocytosis.  COVID/influenza/RSV PCR negative.  Started on ceftriaxone  and azithromycin , full 20 pathogen respiratory viral panel negative.  Doing much better, weaned from BiPAP to 5 L currently.  Continue antibiotics.  [facility], educated to use every hour, nurse has been instructed to wean oxygen as able to.   History of CAD status post CABG/elevated troponin/NSTEMI Troponin 917> 4552 and EKG without acute ischemic changes.  Initially cardiology evaluated the patient and felt that troponin elevation is likely secondary to demand ischemia rather than ACS however based on significant jump on troponin and echo showing left ventricular global hypokinesis, he qualified for NSTEMI however due to patient's overall comorbidities and cachectic state, they recommended continuing medical management with heparin  for 48 hours and then transitioning back to Eliquis , as patient is deemed to be not a candidate for any cardiac catheterization.   Mild hyperkalemia Resolved with Lokelma.   Elevated transaminases AST 131, ALT 74 (transaminases elevated on previous labs done in February 2023 as well and no significant change), alk phos and T. bili normal.  Negative right upper quadrant abdominal ultrasound.  Likely due to acute stress.  LFTs improving.   Chronic combined systolic and diastolic dysfunction: Last echo done in February 2023 showing EF 35 to 40%, grade 1  diastolic dysfunction, mild mitral regurgitation, and mild to moderate aortic regurgitation.  CT showing small right pleural effusion.  Repeat echo shows improved ejection fraction 45 to 50%.  Resume Lasix .   CKD stage IIIb Baseline creatinine appears to be around 1.6.  Currently improved and down to 1.3.   Paroxysmal A-fib Currently in sinus rhythm.  Holding home Eliquis  at this time as patient is on heparin  drip.   Hypertension: Blood pressure controlled, resuming home diltiazem .  Gout: Does not take allopurinol  at home.  GERD/PUD: PPI.  Moderate to severe protein calorie malnutrition: Have ordered protein shakes supplements and consulted dietitian.  DVT prophylaxis: apixaban  (ELIQUIS ) tablet 2.5 mg Start: 12/06/23 1000   Code Status: Limited: Do not attempt resuscitation (DNR) -DNR-LIMITED -Do Not Intubate/DNI   Family Communication: Son at the bedside.  Status is: Inpatient Remains inpatient appropriate because: Still hypoxic and requiring oxygen.  Estimated body mass index is 14.28 kg/m as calculated from the following:   Height as of this encounter: 5' 7 (1.702 m).   Weight as of this encounter: 41.4 kg.    Nutritional Assessment: Body mass index is 14.28 kg/m.[name] Seen by dietician.  I agree with the assessment and plan as outlined below: Nutrition Status:    Skin Assessment: I have examined the patient's skin and I agree with the wound assessment as performed by the wound  care RN as outlined below:    Consultants:  Cardiology  Procedures:  As above  Antimicrobials:  Anti-infectives (From admission, onward)    Start     Dose/Rate Route Frequency Ordered Stop   12/05/23 0000  azithromycin  (ZITHROMAX ) 500 mg in sodium chloride  0.9 % 250 mL IVPB        500 mg 250 mL/hr over 60 Minutes Intravenous Every 24 hours 12/04/23 0543     12/04/23 2200  cefTRIAXone  (ROCEPHIN ) 1 g in sodium chloride  0.9 % 100 mL IVPB        1 g 200 mL/hr over 30 Minutes Intravenous Every  24 hours 12/04/23 0543     12/03/23 2245  cefTRIAXone  (ROCEPHIN ) 2 g in sodium chloride  0.9 % 100 mL IVPB        2 g 200 mL/hr over 30 Minutes Intravenous  Once 12/03/23 2232 12/03/23 2329   12/03/23 2245  azithromycin  (ZITHROMAX ) 500 mg in sodium chloride  0.9 % 250 mL IVPB        500 mg 250 mL/hr over 60 Minutes Intravenous  Once 12/03/23 2232 12/04/23 0040         Subjective: Patient seen and examined, son at the bedside.  Patient fully alert and oriented, speaks in full sentences without having dyspnea.  Still requiring 5 L of oxygen.  Denies any chest pain or shortness of breath.  Complains of generalized body ache.  Objective: Vitals:   12/04/23 1958 12/04/23 2332 12/05/23 0420 12/05/23 0831  BP: (!) 122/48 (!) 144/64 (!) 124/51 134/69  Pulse:  87 85 86  Resp:  20 20 18   Temp:  (!) 97.5 F (36.4 C) (!) 97.5 F (36.4 C) 97.6 F (36.4 C)  TempSrc:  Oral Oral Oral  SpO2: 95% 100% 95% 97%  Weight:    41.4 kg  Height:        Intake/Output Summary (Last 24 hours) at 12/05/2023 1134 Last data filed at 12/05/2023 0537 Gross per 24 hour  Intake 1347.92 ml  Output 500 ml  Net 847.92 ml   Filed Weights   12/03/23 2119 12/04/23 1414 12/05/23 0831  Weight: 54 kg 41.6 kg 41.4 kg    Examination:  General exam: Appears calm and comfortable  Respiratory system: Clear to auscultation. Respiratory effort normal. Cardiovascular system: S1 & S2 heard, RRR. No JVD, murmurs, rubs, gallops or clicks. No pedal edema. Gastrointestinal system: Abdomen is nondistended, soft and nontender. No organomegaly or masses felt. Normal bowel sounds heard. Central nervous system: Alert and oriented. No focal neurological deficits. Extremities: Symmetric 5 x 5 power. Skin: No rashes, lesions or ulcers.  Psychiatry: Judgement and insight appear normal. Mood & affect appropriate.   Data Reviewed: I have personally reviewed following labs and imaging studies  CBC: Recent Labs  Lab 12/03/23 2156  12/03/23 2159 12/05/23 0258  WBC 10.2  --  11.5*  NEUTROABS 8.4*  --   --   HGB 16.2 16.7  16.3 12.5*  HCT 53.8* 49.0  48.0 40.7  MCV 94.9  --  93.3  PLT 262  --  218   Basic Metabolic Panel: Recent Labs  Lab 12/03/23 2156 12/03/23 2159 12/04/23 0715 12/04/23 1653 12/05/23 0842  NA 141 138  138 141  --  143  K 5.9* 5.5*  5.6* 5.8* 5.1 5.0  CL 98 100 100  --  102  CO2 29  --  29  --  29  GLUCOSE 113* 114* 81  --  78  BUN 44*  52* 47*  --  44*  CREATININE 1.51* 1.60* 1.80*  --  1.31*  CALCIUM  9.0  --  8.5*  --  8.4*   GFR: Estimated Creatinine Clearance (by C-G formula based on SCr of 1.31 mg/dL (H)) Male: 81.6 mL/min (A) Male: 21.5 mL/min (A) Liver Function Tests: Recent Labs  Lab 12/03/23 2156 12/04/23 0715 12/05/23 0842  AST 131* 106* 51*  ALT 74* 57* 39  ALKPHOS 62 54 51  BILITOT 0.8 0.9 0.4  PROT 7.4 6.2* 5.6*  ALBUMIN  4.2 3.5 3.1*   No results for input(s): LIPASE, AMYLASE in the last 168 hours. Recent Labs  Lab 12/04/23 0012 12/04/23 0713  AMMONIA 29 32   Coagulation Profile: No results for input(s): INR, PROTIME in the last 168 hours. Cardiac Enzymes: No results for input(s): CKTOTAL, CKMB, CKMBINDEX, TROPONINI in the last 168 hours. BNP (last 3 results) No results for input(s): PROBNP in the last 8760 hours. HbA1C: No results for input(s): HGBA1C in the last 72 hours. CBG: No results for input(s): GLUCAP in the last 168 hours. Lipid Profile: No results for input(s): CHOL, HDL, LDLCALC, TRIG, CHOLHDL, LDLDIRECT in the last 72 hours. Thyroid  Function Tests: Recent Labs    12/04/23 0510  TSH 4.592*   Anemia Panel: Recent Labs    12/04/23 1511  VITAMINB12 759   Sepsis Labs: Recent Labs  Lab 12/04/23 0715  PROCALCITON <0.10    Recent Results (from the past 240 hours)  Blood culture (routine x 2)     Status: None (Preliminary result)   Collection Time: 12/03/23  9:41 PM   Specimen: BLOOD   Result Value Ref Range Status   Specimen Description BLOOD SITE NOT SPECIFIED  Final   Special Requests   Final    BOTTLES DRAWN AEROBIC AND ANAEROBIC Blood Culture results may not be optimal due to an inadequate volume of blood received in culture bottles   Culture   Final    NO GROWTH 2 DAYS Performed at [facility], [address] [place], [place] [address]    Report Status PENDING  Incomplete  Blood culture (routine x 2)     Status: None (Preliminary result)   Collection Time: 12/03/23  9:51 PM   Specimen: BLOOD  Result Value Ref Range Status   Specimen Description BLOOD SITE NOT SPECIFIED  Final   Special Requests   Final    BOTTLES DRAWN AEROBIC AND ANAEROBIC Blood Culture results may not be optimal due to an inadequate volume of blood received in culture bottles   Culture   Final    NO GROWTH 2 DAYS Performed at [facility], [address] [place], [place] [address]    Report Status PENDING  Incomplete  Resp panel by RT-PCR (RSV, Flu A&B, Covid) Anterior Nasal Swab     Status: None   Collection Time: 12/03/23 11:19 PM   Specimen: Anterior Nasal Swab  Result Value Ref Range Status   SARS Coronavirus 2 by RT PCR NEGATIVE NEGATIVE Final   Influenza A by PCR NEGATIVE NEGATIVE Final   Influenza B by PCR NEGATIVE NEGATIVE Final    Comment: (NOTE) The Xpert Xpress SARS-CoV-2/FLU/RSV plus assay is intended as an aid in the diagnosis of influenza from Nasopharyngeal swab specimens and should not be used as a sole basis for treatment. Nasal washings and aspirates are unacceptable for Xpert Xpress SARS-CoV-2/FLU/RSV testing.  Fact Sheet for Patients: bloggercourse.com  Fact Sheet for Healthcare Providers: seriousbroker.it  This test is  not yet approved or cleared by the [place]  FDA and has been authorized for detection and/or diagnosis of SARS-CoV-2 by FDA under an Emergency Use Authorization (EUA). This  EUA will remain in effect (meaning this test can be used) for the duration of the COVID-19 declaration under Section 564(b)(1) of the Act, 21 U.S.C. section 360bbb-3(b)(1), unless the authorization is terminated or revoked.     Resp Syncytial Virus by PCR NEGATIVE NEGATIVE Final    Comment: (NOTE) Fact Sheet for Patients: bloggercourse.com  Fact Sheet for Healthcare Providers: seriousbroker.it  This test is not yet approved or cleared by the [place]  FDA and has been authorized for detection and/or diagnosis of SARS-CoV-2 by FDA under an Emergency Use Authorization (EUA). This EUA will remain in effect (meaning this test can be used) for the duration of the COVID-19 declaration under Section 564(b)(1) of the Act, 21 U.S.C. section 360bbb-3(b)(1), unless the authorization is terminated or revoked.  Performed at [facility], [address], [place], [place] [address]   Urine Culture (for pregnant, neutropenic or urologic patients or patients with an indwelling urinary catheter)     Status: Abnormal   Collection Time: 12/04/23  5:44 AM   Specimen: Urine, Clean Catch  Result Value Ref Range Status   Specimen Description URINE, CLEAN CATCH  Final   Special Requests NONE  Final   Culture (A)  Final    <10,000 COLONIES/mL INSIGNIFICANT GROWTH Performed at [facility], [address] [place], [place] [address]    Report Status 12/05/2023 FINAL  Final  Respiratory (~20 pathogens) panel by PCR     Status: None   Collection Time: 12/04/23  5:47 AM   Specimen: Nasopharyngeal Swab; Respiratory  Result Value Ref Range Status   Adenovirus NOT DETECTED NOT DETECTED Final   Coronavirus 229E NOT DETECTED NOT DETECTED Final    Comment: (NOTE) The Coronavirus on the Respiratory Panel, DOES NOT test for the novel  Coronavirus (2019 nCoV)    Coronavirus HKU1 NOT DETECTED NOT DETECTED Final   Coronavirus NL63 NOT DETECTED  NOT DETECTED Final   Coronavirus OC43 NOT DETECTED NOT DETECTED Final   Metapneumovirus NOT DETECTED NOT DETECTED Final   Rhinovirus / Enterovirus NOT DETECTED NOT DETECTED Final   Influenza A NOT DETECTED NOT DETECTED Final   Influenza B NOT DETECTED NOT DETECTED Final   Parainfluenza Virus 1 NOT DETECTED NOT DETECTED Final   Parainfluenza Virus 2 NOT DETECTED NOT DETECTED Final   Parainfluenza Virus 3 NOT DETECTED NOT DETECTED Final   Parainfluenza Virus 4 NOT DETECTED NOT DETECTED Final   Respiratory Syncytial Virus NOT DETECTED NOT DETECTED Final   Bordetella pertussis NOT DETECTED NOT DETECTED Final   Bordetella Parapertussis NOT DETECTED NOT DETECTED Final   Chlamydophila pneumoniae NOT DETECTED NOT DETECTED Final   Mycoplasma pneumoniae NOT DETECTED NOT DETECTED Final    Comment: Performed at [facility], [address] [place], [place] [address]     Radiology Studies: ECHOCARDIOGRAM COMPLETE Result Date: 12/04/2023    ECHOCARDIOGRAM REPORT   Patient Name:   ANTWIAN SANTAANA Date of Exam: 12/04/2023 Medical Rec #:  [id number]         Height:       67.0 in Accession #:    [id number]        Weight:       119.0 lb Date of Birth:  09-10-1930          BSA:  1.622 m Patient Age:    89 years          BP:           120/60 mmHg Patient Gender: M                 HR:           89 bpm. Exam Location:  Inpatient Procedure: 2D Echo, Cardiac Doppler and Color Doppler (Both Spectral and Color            Flow Doppler were utilized during procedure). Indications:    Elevated Troponin  History:        Patient has prior history of Echocardiogram examinations, most                 recent 03/03/2021. CAD, Prior CABG; Risk Factors:Hypertension.  Sonographer:    Koleen Popper RDCS Referring Phys: [id number] VASUNDHRA RATHORE IMPRESSIONS  1. Left ventricular ejection fraction, by estimation, is 45 to 50%. The left ventricle has mildly decreased function. The left ventricle demonstrates global  hypokinesis. There is mild left ventricular hypertrophy. Left ventricular diastolic function could not be evaluated.  2. Right ventricular systolic function mild to moderately reduced. The right ventricular size is normal. Mildly increased right ventricular wall thickness. There is normal pulmonary artery systolic pressure.  3. The mitral valve is degenerative. No evidence of mitral valve regurgitation. No evidence of mitral stenosis.  4. The aortic valve is tricuspid. Aortic valve regurgitation is mild to moderate. Aortic valve sclerosis is present, with no evidence of aortic valve stenosis.  5. Cannot exclude a small PFO. Comparison(s): A prior study was performed on 03/03/2021. LVEF 35-40%, global hypokinesis, akinetic anteroseptal/inferoseptal wall from base to apex consistent with prior CABG, grade 1 diastolic dysfunction, severe LAE, moderate RAE. FINDINGS  Left Ventricle: Left ventricular ejection fraction, by estimation, is 45 to 50%. The left ventricle has mildly decreased function. The left ventricle demonstrates global hypokinesis. The left ventricular internal cavity size was normal in size. There is  mild left ventricular hypertrophy. Abnormal (paradoxical) septal motion consistent with post-operative status. Left ventricular diastolic function could not be evaluated due to nondiagnostic images. Left ventricular diastolic function could not be evaluated. Right Ventricle: The right ventricular size is normal. Mildly increased right ventricular wall thickness. Right ventricular systolic function mild to moderately reduced. There is normal pulmonary artery systolic pressure. The tricuspid regurgitant velocity is 2.82 m/s, and with an assumed right atrial pressure of 3 mmHg, the estimated right ventricular systolic pressure is 34.8 mmHg. Left Atrium: Left atrial size was normal in size. Right Atrium: Right atrial size was normal in size. Pericardium: There is no evidence of pericardial effusion. Mitral  Valve: The mitral valve is degenerative in appearance. There is mild thickening of the mitral valve leaflet(s). There is mild calcification of the mitral valve leaflet(s). Mild mitral annular calcification. No evidence of mitral valve regurgitation. No evidence of mitral valve stenosis. Tricuspid Valve: The tricuspid valve is grossly normal. Tricuspid valve regurgitation is mild . No evidence of tricuspid stenosis. Aortic Valve: The aortic valve is tricuspid. Aortic valve regurgitation is mild to moderate. Aortic valve sclerosis is present, with no evidence of aortic valve stenosis. Pulmonic Valve: The pulmonic valve was normal in structure. Pulmonic valve regurgitation is trivial. No evidence of pulmonic stenosis. Aorta: The aortic root and ascending aorta are structurally normal, with no evidence of dilitation. IAS/Shunts: Cannot exclude a small PFO.  LEFT VENTRICLE PLAX 2D LVIDd:  4.30 cm LVIDs:         3.00 cm LV PW:         1.20 cm LV IVS:        1.10 cm LVOT diam:     1.94 cm LV SV:         48 LV SV Index:   30 LVOT Area:     2.96 cm  LV Volumes (MOD) LV vol d, MOD A2C: 88.5 ml LV vol s, MOD A2C: 47.0 ml LV SV MOD A2C:     41.5 ml RIGHT VENTRICLE            IVC RV Basal diam:  3.95 cm    IVC diam: 1.35 cm RV S prime:     6.98 cm/s TAPSE (M-mode): 1.5 cm LEFT ATRIUM           Index        RIGHT ATRIUM           Index LA diam:      2.86 cm 1.76 cm/m   RA Area:     10.80 cm LA Vol (A4C): 36.4 ml 22.44 ml/m  RA Volume:   19.60 ml  12.08 ml/m  AORTIC VALVE LVOT Vmax:   97.50 cm/s LVOT Vmean:  62.400 cm/s LVOT VTI:    0.164 m  AORTA Ao Root diam: 3.10 cm Ao Asc diam:  3.42 cm TRICUSPID VALVE TR Peak grad:   31.8 mmHg TR Vmax:        282.00 cm/s  SHUNTS Systemic VTI:  0.16 m Systemic Diam: 1.94 cm Sunit Tolia Electronically signed by Madonna Large Signature Date/Time: 12/04/2023/1:28:08 PM    Final    EEG adult Result Date: 12/04/2023 Shelton Arlin KIDD, MD     12/04/2023  8:07 AM Patient Name: ZAYDON KINSER MRN: [id number] Epilepsy Attending: Arlin KIDD Shelton Referring Physician/Provider: Alfornia Madison, MD Date: 12/04/2023 Duration: 25.34 mins Patient history: 89yo M with ams. EEG to evaluate for seizure Level of alertness: Awake AEDs during EEG study: None Technical aspects: This EEG study was done with scalp electrodes positioned according to the 10-20 International system of electrode placement. Electrical activity was reviewed with band pass filter of 1-70Hz , sensitivity of 7 uV/mm, display speed of 39mm/sec with a 60Hz  notched filter applied as appropriate. EEG data were recorded continuously and digitally stored.  Video monitoring was available and reviewed as appropriate. Description: EEG showed continuous generalized 3 to 6 Hz theta-delta slowing. Hyperventilation and photic stimulation were not performed.   ABNORMALITY - Continuous slow, generalized IMPRESSION: This study is suggestive of generalized cerebral dysfunction  (encephalopathy). No seizures or epileptiform discharges were seen throughout the recording. Priyanka KIDD Shelton   CT ABDOMEN PELVIS WO CONTRAST Result Date: 12/04/2023 EXAM: CT ABDOMEN AND PELVIS WITHOUT CONTRAST 12/04/2023 12:18:19 AM TECHNIQUE: CT of the abdomen and pelvis was performed without the administration of intravenous contrast. Multiplanar reformatted images are provided for review. Automated exposure control, iterative reconstruction, and/or weight-based adjustment of the mA/kV was utilized to reduce the radiation dose to as low as reasonably achievable. COMPARISON: CT 03/01/2021. CLINICAL HISTORY: elevated LFTs fatigue FINDINGS: LIMITATIONS/ARTIFACTS: Lack of IV or oral contrast and paucity of intraabdominal fat limits assessment. LOWER CHEST: Chronic interstitial lung disease in the lower lungs. Small right pleural effusion. LIVER: The liver is unremarkable. GALLBLADDER AND BILE DUCTS: Gallbladder is unremarkable. No biliary ductal dilatation. SPLEEN: No acute  abnormality. PANCREAS: No acute abnormality. ADRENAL GLANDS: No acute abnormality. KIDNEYS, URETERS AND BLADDER:  No stones in the kidneys or ureters. No hydronephrosis. No perinephric or periureteral stranding. Urinary bladder is unremarkable. GI AND BOWEL: Moderate hiatal hernia. Colonic diverticulosis without diverticulitis. There is no bowel obstruction. PERITONEUM AND RETROPERITONEUM: No ascites. No free air. VASCULATURE: Aorta is normal in caliber. Aortic atherosclerotic calcification. LYMPH NODES: No lymphadenopathy. REPRODUCTIVE ORGANS: No acute abnormality. 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 Chronic bilateral L5 pars defects with grade 1 spondylolisthesis of L5. No focal soft tissue abnormality. IMPRESSION: 1. No definite acute findings in the abdomen or pelvis. 2. Small right pleural effusion. 3. Moderate hiatal hernia. Electronically signed by: Norman Gatlin MD 12/04/2023 12:29 AM EST RP Workstation: [id number]   CT HEAD WO CONTRAST Result Date: 12/04/2023 EXAM: CT HEAD WITHOUT CONTRAST 12/04/2023 12:18:19 AM TECHNIQUE: CT of the head was performed without the administration of intravenous contrast. Automated exposure control, iterative reconstruction, and/or weight based adjustment of the mA/kV was utilized to reduce the radiation dose to as low as reasonably achievable. COMPARISON: MRI head 2 / 9 / 21. CLINICAL HISTORY: ams FINDINGS: BRAIN AND VENTRICLES: No acute hemorrhage. No evidence of acute infarct. No hydrocephalus. No extra-axial collection. No mass effect or midline shift. Chronic severe white matter changes and generalized volume loss. Scattered calcified atherosclerosis. ORBITS: Lens extraction. SINUSES: No acute abnormality. SOFT TISSUES AND SKULL: No acute soft tissue abnormality. No skull fracture. IMPRESSION: 1. No acute intracranial abnormality. Electronically signed by: Norman Gatlin MD 12/04/2023 12:23 AM EST RP Workstation: [id number]   US  Abdomen  Limited RUQ (LIVER/GB) Result Date: 12/04/2023 CLINICAL DATA:  Elevated LFT EXAM: ULTRASOUND ABDOMEN LIMITED RIGHT UPPER QUADRANT COMPARISON:  None Available. FINDINGS: Gallbladder: No gallstones or wall thickening visualized. No sonographic Murphy sign noted by sonographer. Common bile duct: Diameter: 2.9 mm Liver: No focal lesion identified. Within normal limits in parenchymal echogenicity. Portal vein is patent on color Doppler imaging with normal direction of blood flow towards the liver. Other: Possible fluid-filled bowel in the left upper quadrant. IMPRESSION: 1. Negative right upper quadrant ultrasound Electronically Signed   By: Luke Bun M.D.   On: 12/04/2023 00:09   DG Chest Port 1 View Result Date: 12/03/2023 CLINICAL DATA:  Altered level of consciousness EXAM: PORTABLE CHEST 1 VIEW COMPARISON:  09/03/2023 FINDINGS: Single frontal view of the chest demonstrates stable postsurgical changes from CABG. The cardiac silhouette is unremarkable. There is chronic background interstitial prominence, with patchy areas of airspace disease throughout the right lung as well as at the left lung base. No effusion or pneumothorax. No acute bony abnormalities. Colonic interposition right upper quadrant abdomen. IMPRESSION: 1. Patchy bilateral airspace disease superimposed upon background scarring. Electronically Signed   By: Ozell Daring M.D.   On: 12/03/2023 22:18    Scheduled Meds:  [START ON 12/06/2023] apixaban   2.5 mg Oral BID   diltiazem   60 mg Oral BID   feeding supplement  237 mL Oral BID BM   pantoprazole   40 mg Oral Q1200   sodium zirconium cyclosilicate  10 g Oral Once   Continuous Infusions:  azithromycin  Stopped (12/05/23 0205)   cefTRIAXone  (ROCEPHIN )  IV Stopped (12/04/23 2308)   heparin  850 Units/hr (12/05/23 0933)     LOS: 1 day   Fredia Skeeter, MD [facility]  12/05/2023, 11:34 AM   *Please note that this is a verbal dictation therefore any spelling or grammatical  errors are due to the Dragon Medical One system interpretation.  Please page via Amion and do not message via secure chat  for urgent patient care matters. Secure chat can be used for non urgent patient care matters.  How to contact the [facility] Attending or Consulting provider 7A - 7P or covering provider during after hours 7P -7A, for this patient?  Check the care team in [facility] and look for a) attending/consulting [facility] provider listed and b) the [facility] team listed. Page or secure chat 7A-7P. Log into [email or web address] and use [facility] universal password to access. If you do not have the password, please contact the hospital operator. Locate the [facility] provider you are looking for under [facility] and page to a number that you can be directly reached. If you still have difficulty reaching the provider, please page the [facility] (Director on Call) for the Hospitalists listed on amion for assistance.

## 2023-12-05 NOTE — Progress Notes (Signed)
 PHARMACY - ANTICOAGULATION CONSULT NOTE  Pharmacy Consult for heparin  Indication: chest pain/ACS  Allergies  Allergen Reactions   Aspirin Other (See Comments)    Stomach pain    Chlorhexidine Other (See Comments)    CAUSED SKIN IRRITATION AND BURNING SENSATION   Enalapril Other (See Comments)    Cannot take due to kidney compromise   Gabapentin Other (See Comments)    Caused nightmares   Statins Other (See Comments)    Myalgia    Patient Measurements: Height: 5' 7 (170.2 cm) Weight: 41.6 kg (91 lb 11.4 oz) IBW/kg (Calculated) : 66.1 HEPARIN  DW (KG): 41.6  Vital Signs: Temp: 97.5 F (36.4 C) (11/08 0420) Temp Source: Oral (11/08 0420) BP: 124/51 (11/08 0420) Pulse Rate: 85 (11/08 0420)  Labs: Recent Labs    12/03/23 2156 12/03/23 2159 12/04/23 0510 12/04/23 0715 12/04/23 0750 12/04/23 2142 12/05/23 0258  HGB 16.2 16.7  16.3  --   --   --   --  12.5*  HCT 53.8* 49.0  48.0  --   --   --   --  40.7  PLT 262  --   --   --   --   --  218  HEPARINUNFRC  --   --   --   --  0.11* 0.30 0.47  CREATININE 1.51* 1.60*  --  1.80*  --   --   --   TROPONINIHS 917*  --  4,552*  --   --   --   --     Estimated Creatinine Clearance (by C-G formula based on SCr of 1.8 mg/dL (H)) Male: 86.5 mL/min (A) Male: 15.7 mL/min (A)   Medical History: Past Medical History:  Diagnosis Date   Atrial fibrillation (HCC)    2015  W/ RVR   Bladder tumor    CHF (congestive heart failure) (HCC) 2015   CKD (chronic kidney disease), stage III (HCC)    Coronary artery disease    NO LONGER SEES CARDIOLOGIST - LAST SAW DR. HILARIO SHARPS 2 OR MORE YRS AGO-PT SEES PCP DR NORLEEN LEWIS   GERD (gastroesophageal reflux disease)    Hiatal hernia    History of colon polyps    History of gastric ulcer    1989  peptic and duodenal   History of MI (myocardial infarction)    1995   History of non-ST elevation myocardial infarction (NSTEMI)    07-05-1999  s/p cabg   Hyperlipidemia    Hypertension     OA (osteoarthritis)    Osteomyelitis (HCC) 12/07/2018   S/P CABG (coronary artery bypass graft)    06/ 2001   Ureteral carcinoma, right [facility]) urologist-  dr herrick/  oncologist- dr amadeo   dx 2014  s/p  right nephroureterectomy 01-24-2013 (T3, N0) invasive high grade urothelial carcinoma--     Assessment: 89yo male c/o fatigue, found in ED w/ AMS and hypoxia, during w/u troponin found to be elevated >> to begin heparin ; pt has been on DOAC in the past for PAF but stopped in 2023 and expressed to outpt cards that he is not interested in resuming.   Heparin  level 0.47 is therapeutic with heparin  running at 850 units/hr. Hgb (12.5) and PLTs (218) are stable. Patient renal function is stable. Patient had a new IV line placed yesterday for access issues. Per RN, no report of pauses, issues with the new line, or signs of bleeding.    Goal of Therapy:  Heparin  level 0.3-0.7 units/ml Monitor platelets by anticoagulation  protocol: Yes   Plan:  Continue heparin  at 850 units/hr Monitor daily heparin  levels and CBC Monitor for any signs/symptoms of bleeding  Thank you for allowing pharmacy to be involved with this patient's care.  Mendel Barter, PharmD PGY1 Clinical Pharmacist [facility]  12/05/2023 8:11 AM

## 2023-12-05 NOTE — Progress Notes (Signed)
 Progress Note  Patient Name: Angel Cooper Date of Encounter: 12/05/2023  Primary Cardiologist: Dorn Lesches, MD   Subjective   Doing well, no complaints.   Inpatient Medications    Scheduled Meds:  [START ON 12/06/2023] apixaban   2.5 mg Oral BID   diltiazem   60 mg Oral BID   pantoprazole   40 mg Oral Q1200   sodium zirconium cyclosilicate  10 g Oral Once   Continuous Infusions:  azithromycin  Stopped (12/05/23 0205)   cefTRIAXone  (ROCEPHIN )  IV Stopped (12/04/23 2308)   heparin  850 Units/hr (12/05/23 0933)   PRN Meds: acetaminophen , albuterol , polyethylene glycol   Vital Signs    Vitals:   12/04/23 1958 12/04/23 2332 12/05/23 0420 12/05/23 0831  BP: (!) 122/48 (!) 144/64 (!) 124/51 134/69  Pulse:  87 85 86  Resp:  20 20 18   Temp:  (!) 97.5 F (36.4 C) (!) 97.5 F (36.4 C) 97.6 F (36.4 C)  TempSrc:  Oral Oral Oral  SpO2: 95% 100% 95% 97%  Weight:    41.4 kg  Height:        Intake/Output Summary (Last 24 hours) at 12/05/2023 1134 Last data filed at 12/05/2023 0537 Gross per 24 hour  Intake 1347.92 ml  Output 500 ml  Net 847.92 ml   Filed Weights   12/03/23 2119 12/04/23 1414 12/05/23 0831  Weight: 54 kg 41.6 kg 41.4 kg    Telemetry    NSR with PACs and PVCs - Personally Reviewed  ECG    Sinus tachycardia with inferior infarct - Personally Reviewed  Physical Exam   Physical Exam Vitals and nursing note reviewed.  Constitutional:      Comments: Frail elderly male  HENT:     Head: Normocephalic and atraumatic.  Eyes:     Conjunctiva/sclera: Conjunctivae normal.  Cardiovascular:     Rate and Rhythm: Normal rate and regular rhythm.     Heart sounds: Murmur heard.  Pulmonary:     Effort: Pulmonary effort is normal.     Breath sounds: Normal breath sounds.  Musculoskeletal:        General: No swelling or tenderness.     Right lower leg: No edema.     Left lower leg: No edema.  Skin:    Coloration: Skin is not jaundiced or pale.   Neurological:     Mental Status: He is alert.      Labs    Chemistry Recent Labs  Lab 12/03/23 2156 12/03/23 2159 12/04/23 0715 12/04/23 1653 12/05/23 0842  NA 141 138  138 141  --  143  K 5.9* 5.5*  5.6* 5.8* 5.1 5.0  CL 98 100 100  --  102  CO2 29  --  29  --  29  GLUCOSE 113* 114* 81  --  78  BUN 44* 52* 47*  --  44*  CREATININE 1.51* 1.60* 1.80*  --  1.31*  CALCIUM  9.0  --  8.5*  --  8.4*  PROT 7.4  --  6.2*  --  5.6*  ALBUMIN  4.2  --  3.5  --  3.1*  AST 131*  --  106*  --  51*  ALT 74*  --  57*  --  39  ALKPHOS 62  --  54  --  51  BILITOT 0.8  --  0.9  --  0.4  GFRNONAA 43*  --  35*  --  51*  ANIONGAP 14  --  12  --  12  Hematology Recent Labs  Lab 12/03/23 2156 12/03/23 2159 12/05/23 0258  WBC 10.2  --  11.5*  RBC 5.67  --  4.36  HGB 16.2 16.7  16.3 12.5*  HCT 53.8* 49.0  48.0 40.7  MCV 94.9  --  93.3  MCH 28.6  --  28.7  MCHC 30.1  --  30.7  RDW 16.1*  --  16.0*  PLT 262  --  218    Cardiac EnzymesNo results for input(s): TROPONINI in the last 168 hours. No results for input(s): TROPIPOC in the last 168 hours.   BNP Recent Labs  Lab 12/04/23 0510  BNP 1,207.0*     DDimer No results for input(s): DDIMER in the last 168 hours.   Radiology    ECHOCARDIOGRAM COMPLETE Result Date: 12/04/2023    ECHOCARDIOGRAM REPORT   Patient Name:   LEVONTE MOLINA Date of Exam: 12/04/2023 Medical Rec #:  [id number]         Height:       67.0 in Accession #:    [id number]        Weight:       119.0 lb Date of Birth:  March 18, 1930          BSA:          1.622 m Patient Age:    89 years          BP:           120/60 mmHg Patient Gender: M                 HR:           89 bpm. Exam Location:  Inpatient Procedure: 2D Echo, Cardiac Doppler and Color Doppler (Both Spectral and Color            Flow Doppler were utilized during procedure). Indications:    Elevated Troponin  History:        Patient has prior history of Echocardiogram examinations, most                  recent 03/03/2021. CAD, Prior CABG; Risk Factors:Hypertension.  Sonographer:    Koleen Popper RDCS Referring Phys: [id number] VASUNDHRA RATHORE IMPRESSIONS  1. Left ventricular ejection fraction, by estimation, is 45 to 50%. The left ventricle has mildly decreased function. The left ventricle demonstrates global hypokinesis. There is mild left ventricular hypertrophy. Left ventricular diastolic function could not be evaluated.  2. Right ventricular systolic function mild to moderately reduced. The right ventricular size is normal. Mildly increased right ventricular wall thickness. There is normal pulmonary artery systolic pressure.  3. The mitral valve is degenerative. No evidence of mitral valve regurgitation. No evidence of mitral stenosis.  4. The aortic valve is tricuspid. Aortic valve regurgitation is mild to moderate. Aortic valve sclerosis is present, with no evidence of aortic valve stenosis.  5. Cannot exclude a small PFO. Comparison(s): A prior study was performed on 03/03/2021. LVEF 35-40%, global hypokinesis, akinetic anteroseptal/inferoseptal wall from base to apex consistent with prior CABG, grade 1 diastolic dysfunction, severe LAE, moderate RAE. FINDINGS  Left Ventricle: Left ventricular ejection fraction, by estimation, is 45 to 50%. The left ventricle has mildly decreased function. The left ventricle demonstrates global hypokinesis. The left ventricular internal cavity size was normal in size. There is  mild left ventricular hypertrophy. Abnormal (paradoxical) septal motion consistent with post-operative status. Left ventricular diastolic function could not be evaluated due to nondiagnostic images. Left ventricular diastolic function could not be  evaluated. Right Ventricle: The right ventricular size is normal. Mildly increased right ventricular wall thickness. Right ventricular systolic function mild to moderately reduced. There is normal pulmonary artery systolic pressure. The tricuspid  regurgitant velocity is 2.82 m/s, and with an assumed right atrial pressure of 3 mmHg, the estimated right ventricular systolic pressure is 34.8 mmHg. Left Atrium: Left atrial size was normal in size. Right Atrium: Right atrial size was normal in size. Pericardium: There is no evidence of pericardial effusion. Mitral Valve: The mitral valve is degenerative in appearance. There is mild thickening of the mitral valve leaflet(s). There is mild calcification of the mitral valve leaflet(s). Mild mitral annular calcification. No evidence of mitral valve regurgitation. No evidence of mitral valve stenosis. Tricuspid Valve: The tricuspid valve is grossly normal. Tricuspid valve regurgitation is mild . No evidence of tricuspid stenosis. Aortic Valve: The aortic valve is tricuspid. Aortic valve regurgitation is mild to moderate. Aortic valve sclerosis is present, with no evidence of aortic valve stenosis. Pulmonic Valve: The pulmonic valve was normal in structure. Pulmonic valve regurgitation is trivial. No evidence of pulmonic stenosis. Aorta: The aortic root and ascending aorta are structurally normal, with no evidence of dilitation. IAS/Shunts: Cannot exclude a small PFO.  LEFT VENTRICLE PLAX 2D LVIDd:         4.30 cm LVIDs:         3.00 cm LV PW:         1.20 cm LV IVS:        1.10 cm LVOT diam:     1.94 cm LV SV:         48 LV SV Index:   30 LVOT Area:     2.96 cm  LV Volumes (MOD) LV vol d, MOD A2C: 88.5 ml LV vol s, MOD A2C: 47.0 ml LV SV MOD A2C:     41.5 ml RIGHT VENTRICLE            IVC RV Basal diam:  3.95 cm    IVC diam: 1.35 cm RV S prime:     6.98 cm/s TAPSE (M-mode): 1.5 cm LEFT ATRIUM           Index        RIGHT ATRIUM           Index LA diam:      2.86 cm 1.76 cm/m   RA Area:     10.80 cm LA Vol (A4C): 36.4 ml 22.44 ml/m  RA Volume:   19.60 ml  12.08 ml/m  AORTIC VALVE LVOT Vmax:   97.50 cm/s LVOT Vmean:  62.400 cm/s LVOT VTI:    0.164 m  AORTA Ao Root diam: 3.10 cm Ao Asc diam:  3.42 cm TRICUSPID VALVE  TR Peak grad:   31.8 mmHg TR Vmax:        282.00 cm/s  SHUNTS Systemic VTI:  0.16 m Systemic Diam: 1.94 cm Sunit Tolia Electronically signed by Madonna Large Signature Date/Time: 12/04/2023/1:28:08 PM    Final    EEG adult Result Date: 12/04/2023 Shelton Arlin KIDD, MD     12/04/2023  8:07 AM Patient Name: TRENTEN WATCHMAN MRN: [id number] Epilepsy Attending: Arlin KIDD Shelton Referring Physician/Provider: Alfornia Madison, MD Date: 12/04/2023 Duration: 25.34 mins Patient history: 89yo M with ams. EEG to evaluate for seizure Level of alertness: Awake AEDs during EEG study: None Technical aspects: This EEG study was done with scalp electrodes positioned according to the 10-20 International system of electrode placement. Electrical activity was reviewed  with band pass filter of 1-70Hz , sensitivity of 7 uV/mm, display speed of 17mm/sec with a 60Hz  notched filter applied as appropriate. EEG data were recorded continuously and digitally stored.  Video monitoring was available and reviewed as appropriate. Description: EEG showed continuous generalized 3 to 6 Hz theta-delta slowing. Hyperventilation and photic stimulation were not performed.   ABNORMALITY - Continuous slow, generalized IMPRESSION: This study is suggestive of generalized cerebral dysfunction  (encephalopathy). No seizures or epileptiform discharges were seen throughout the recording. Priyanka MALVA Krebs   CT ABDOMEN PELVIS WO CONTRAST Result Date: 12/04/2023 EXAM: CT ABDOMEN AND PELVIS WITHOUT CONTRAST 12/04/2023 12:18:19 AM TECHNIQUE: CT of the abdomen and pelvis was performed without the administration of intravenous contrast. Multiplanar reformatted images are provided for review. Automated exposure control, iterative reconstruction, and/or weight-based adjustment of the mA/kV was utilized to reduce the radiation dose to as low as reasonably achievable. COMPARISON: CT 03/01/2021. CLINICAL HISTORY: elevated LFTs fatigue FINDINGS: LIMITATIONS/ARTIFACTS: Lack  of IV or oral contrast and paucity of intraabdominal fat limits assessment. LOWER CHEST: Chronic interstitial lung disease in the lower lungs. Small right pleural effusion. LIVER: The liver is unremarkable. GALLBLADDER AND BILE DUCTS: Gallbladder is unremarkable. No biliary ductal dilatation. SPLEEN: No acute abnormality. PANCREAS: No acute abnormality. ADRENAL GLANDS: No acute abnormality. KIDNEYS, URETERS AND BLADDER: No stones in the kidneys or ureters. No hydronephrosis. No perinephric or periureteral stranding. Urinary bladder is unremarkable. GI AND BOWEL: Moderate hiatal hernia. Colonic diverticulosis without diverticulitis. There is no bowel obstruction. PERITONEUM AND RETROPERITONEUM: No ascites. No free air. VASCULATURE: Aorta is normal in caliber. Aortic atherosclerotic calcification. LYMPH NODES: No lymphadenopathy. REPRODUCTIVE ORGANS: No acute abnormality. 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 Chronic bilateral L5 pars defects with grade 1 spondylolisthesis of L5. No focal soft tissue abnormality. IMPRESSION: 1. No definite acute findings in the abdomen or pelvis. 2. Small right pleural effusion. 3. Moderate hiatal hernia. Electronically signed by: Norman Gatlin MD 12/04/2023 12:29 AM EST RP Workstation: [id number]   CT HEAD WO CONTRAST Result Date: 12/04/2023 EXAM: CT HEAD WITHOUT CONTRAST 12/04/2023 12:18:19 AM TECHNIQUE: CT of the head was performed without the administration of intravenous contrast. Automated exposure control, iterative reconstruction, and/or weight based adjustment of the mA/kV was utilized to reduce the radiation dose to as low as reasonably achievable. COMPARISON: MRI head 2 / 9 / 21. CLINICAL HISTORY: ams FINDINGS: BRAIN AND VENTRICLES: No acute hemorrhage. No evidence of acute infarct. No hydrocephalus. No extra-axial collection. No mass effect or midline shift. Chronic severe white matter changes and generalized volume loss. Scattered calcified  atherosclerosis. ORBITS: Lens extraction. SINUSES: No acute abnormality. SOFT TISSUES AND SKULL: No acute soft tissue abnormality. No skull fracture. IMPRESSION: 1. No acute intracranial abnormality. Electronically signed by: Norman Gatlin MD 12/04/2023 12:23 AM EST RP Workstation: [id number]   US  Abdomen Limited RUQ (LIVER/GB) Result Date: 12/04/2023 CLINICAL DATA:  Elevated LFT EXAM: ULTRASOUND ABDOMEN LIMITED RIGHT UPPER QUADRANT COMPARISON:  None Available. FINDINGS: Gallbladder: No gallstones or wall thickening visualized. No sonographic Murphy sign noted by sonographer. Common bile duct: Diameter: 2.9 mm Liver: No focal lesion identified. Within normal limits in parenchymal echogenicity. Portal vein is patent on color Doppler imaging with normal direction of blood flow towards the liver. Other: Possible fluid-filled bowel in the left upper quadrant. IMPRESSION: 1. Negative right upper quadrant ultrasound Electronically Signed   By: Luke Bun M.D.   On: 12/04/2023 00:09   DG Chest Port 1 View Result Date: 12/03/2023 CLINICAL DATA:  Altered level of consciousness EXAM: PORTABLE CHEST 1 VIEW COMPARISON:  09/03/2023 FINDINGS: Single frontal view of the chest demonstrates stable postsurgical changes from CABG. The cardiac silhouette is unremarkable. There is chronic background interstitial prominence, with patchy areas of airspace disease throughout the right lung as well as at the left lung base. No effusion or pneumothorax. No acute bony abnormalities. Colonic interposition right upper quadrant abdomen. IMPRESSION: 1. Patchy bilateral airspace disease superimposed upon background scarring. Electronically Signed   By: Ozell Daring M.D.   On: 12/03/2023 22:18    Cardiac Studies   Echo 12/04/23:    1. Left ventricular ejection fraction, by estimation, is 45 to 50%. The  left ventricle has mildly decreased function. The left ventricle  demonstrates global hypokinesis. There is mild left  ventricular  hypertrophy. Left ventricular diastolic function  could not be evaluated.   2. Right ventricular systolic function mild to moderately reduced. The  right ventricular size is normal. Mildly increased right ventricular wall  thickness. There is normal pulmonary artery systolic pressure.   3. The mitral valve is degenerative. No evidence of mitral valve  regurgitation. No evidence of mitral stenosis.   4. The aortic valve is tricuspid. Aortic valve regurgitation is mild to  moderate. Aortic valve sclerosis is present, with no evidence of aortic  valve stenosis.   5. Cannot exclude a small PFO.   Patient Profile     89 y.o. adult  hx of CAD status post CABG in 2001, chronic systolic and diastolic heart failure with reduced ejection fraction (EF 35%), moderate AI, paroxysmal A-fib, HTN, HLD, CKD, mild cognitive impairment who is being seen 12/04/2023 for the evaluation of altered mental status, elevated troponin at the request of ED.   He was seen by Dr. Wonda and deemed not a candidate for intervention. An echo was obtained that showed EF 45-50% with global hypokinesis. He was continued on a heparin  drip with plan for 48 hours then switch to home DOAC.  Assessment & Plan   NSTEMI with underlying CAD and CABG history - not a candidate for intervention. Continue on heparin  for a total of 48 hours then switch back to home Eliquis  2.5 mg BID.  CAD s/p CABG  Chronic systolic and diastolic heart failure with improved ejection fraction and mild to moderate RV dysfunction - EF 45-50%, previously 35-40%.  Paroxysmal atrial fibrillation - currently on Cardizem  60 mg BID and renally dosed Eliquis . Change diltiazem  to metoprolol  succinate 50 mg daily given heart failure.    [facility] will sign off.   The patient is not ready for discharge today from a cardiac standpoint. Medication Recommendations: as above.    For questions or updates, please contact [facility] Please consult [email or web address] for contact info under        Signed, Emeline Calender, DO 12/05/2023, 11:34 AM

## 2023-12-05 NOTE — Evaluation (Signed)
 Occupational Therapy Evaluation Patient Details Name: Angel Cooper MRN: [id number] DOB: 26-Jan-1931 Today's Date: 12/05/2023   History of Present Illness   Angel Cooper is a 89 y.o. male admitted 11/6 with confusion and hypoxia. Patient arrived to the ED on 15 L oxygen via nonrebreather.  Tachycardic on arrival but afebrile and not hypotensive.  UDS positive for opiates.    Troponin 917.  Chest x-ray showing patchy bilateral airspace disease superimposed on background scarring.   CT abdomen pelvis showing no definite acute findings in the abdomen or pelvis, small right pleural effusion CT head showing no acute intracranial abnormality.  He was placed on bipap. Cardiology evaluated the patient and felt that troponin elevation is likely secondary to demand ischemia rather than ACS.  Recommended continuing medical management for now. Acute metabolic encephalopathy suspected as well as CHF. PMH: CKD stage IIIb, A-fib on Eliquis , HFrEF, CAD status post CABG, hypertension, hyperlipidemia, gout, GERD, PUD, ureteral carcinoma status post right nephroureterectomy in 2014     Clinical Impressions Pt reports conflicting information related to PLOF. Pt alternately reporting he is Independent with ADLs and that he is needing assistance with bathing and dressing, but unable to state level of assist or who assists him. Pt also reporting use of 3-wheeled Rollator and reporting he has largely been staying the bed for an extended period. Pt reports assistance with IADLs at baseline. Pt now presents with decreased activity tolerance, increased fatigue, decreased cognition, generalized B UE weakness, decreased B UE fine motor coordination, and decreased safety and independence with functional tasks. Pt currently demonstrating ability to complete ADLs largely with Set up to Max assist and rolling in the bed with Min assist. Pt declining sitting EOB or OOB activity this session due to pt fatigue and self-limiting  behavior with pt focusing on wanting to visit with friend even with friend encouraging participation in OT session. Pt VSS on 5L continuous O2 through HFNC. Pt will benefit from acute OT services to address deficits and increase safety and independence with functional tasks. Post acute discharge, pt will benefit from intensive inpatient skilled rehab services < 3 hours per day to maximize rehab potential.      If plan is discharge home, recommend the following:   A lot of help with walking and/or transfers;A lot of help with bathing/dressing/bathroom;Assistance with cooking/housework;Direct supervision/assist for medications management;Direct supervision/assist for financial management;Assist for transportation;Help with stairs or ramp for entrance;Supervision due to cognitive status     Functional Status Assessment   Patient has had a recent decline in their functional status and demonstrates the ability to make significant improvements in function in a reasonable and predictable amount of time.     Equipment Recommendations   BSC/3in1;Tub/shower bench;Other (comment) (other TBD based on pt progress)     Recommendations for Other Services         Precautions/Restrictions   Precautions Precautions: Fall Restrictions Weight Bearing Restrictions Per Provider Order: No     Mobility Bed Mobility Overal bed mobility: Needs Assistance Bed Mobility: Rolling Rolling: Min assist         General bed mobility comments: Pt declined sitting EOB due to fatigue and having a friend in the room. Pt requiring Min assist for supine to sit earlier this day with PT.    Transfers Overall transfer level: Needs assistance                 General transfer comment: Pt declined functional transfers due to fatigue and having a  friend in the room. Pt requiring Mod assist for STS transfer with a RW earlier this day.      Balance Overall balance assessment: Needs assistance                                          ADL either performed or assessed with clinical judgement   ADL Overall ADL's : Needs assistance/impaired Eating/Feeding: Set up;Bed level   Grooming: Set up;Bed level;Wash/dry hands;Wash/dry face;Oral care   Upper Body Bathing: Minimal assistance;Moderate assistance;Bed level;Cueing for compensatory techniques Upper Body Bathing Details (indicate cue type and reason): simulated Lower Body Bathing: Maximal assistance;Bed level   Upper Body Dressing : Minimal assistance;Bed level   Lower Body Dressing: Maximal assistance;Bed level;Cueing for compensatory techniques     Toilet Transfer Details (indicate cue type and reason): pt declined sitting EOB or OOB transfers due to fatigue and wanting to visit with friend Toileting- Clothing Manipulation and Hygiene: Maximal assistance;Bed level;Cueing for compensatory techniques         General ADL Comments: OT eval complated from bed level with ADLs largely simulated due to pt declining further participation and declining sitting EOB and OOB due to fatigue and wanting to visit with friend. Pt with decreased activity tolernace, fatiguing quickly during tasks     Vision Baseline Vision/History: 1 Wears glasses Ability to See in Adequate Light: 1 Impaired (without glasses on) Patient Visual Report: No change from baseline Additional Comments: Pt with glasses present, but declining to put them on. Pt with difficulty seeing small print and small items, but with vision largely [facility] for tasks assessed; not formally screened or evaluated     Perception         Praxis         Pertinent Vitals/Pain Pain Assessment Pain Assessment: Faces Faces Pain Scale: Hurts whole lot Pain Location: buttocks Pain Descriptors / Indicators: Aching, Grimacing, Guarding, Discomfort Pain Intervention(s): Limited activity within patient's tolerance, Monitored during session, Repositioned     Extremity/Trunk  Assessment Upper Extremity Assessment Upper Extremity Assessment: Right hand dominant;Generalized weakness;RUE deficits/detail;LUE deficits/detail RUE Deficits / Details: generalized weakness; decreased fine motor coordination, worse on Left; decreased shoulder AROM, but WFL; all other ROM WFL RUE Coordination: decreased fine motor LUE Deficits / Details: generalized weakness; decreased fine motor coordination, worse on Left; pt missing tip of 3rd digit with skin intact and no current injury; pt also with smaller than typical 5th digit with ROM WFL but with noted decreased in extension of 5th digit;decreased AROM in shoulder but with movement WFL; ROM otherwise WFL; bruising noted to medial side of elbow and forearm LUE Coordination: decreased fine motor   Lower Extremity Assessment Lower Extremity Assessment: Defer to PT evaluation   Cervical / Trunk Assessment Cervical / Trunk Assessment: Kyphotic   Communication Communication Communication: No apparent difficulties   Cognition Arousal: Alert Behavior During Therapy: WFL for tasks assessed/performed, Agitated (Largely WFL but with pt becomming mildly agitated toward end of session due to fatigue and wanting to visit more with his friend who was present throughout session) Cognition: Cognition impaired   Orientation impairments: Time Awareness: Intellectual awareness intact, Online awareness impaired Memory impairment (select all impairments): Short-term memory, Working civil service fast streamer, Conservation officer, historic buildings Attention impairment (select first level of impairment): Selective attention (easily internally distracted) Executive functioning impairment (select all impairments): Organization, Reasoning, Problem solving OT - Cognition Comments: Pt oriented to self,  place, and situation. Pt offering conflicting information about PLOF. Per friend, pt would typically be able to offer an acurate report of PLOF.                 Following  commands: Intact       Cueing  General Comments   Cueing Techniques: Verbal cues;Tactile cues;Visual cues  VSS on 5L continuous O2 through HFNC. Friend present, supportive, and encouraging pt to participate in OT session, but with pt demontrating self-limiting behaviors.   Exercises     Shoulder Instructions      Home Living Family/patient expects to be discharged to:: Private residence Living Arrangements: Spouse/significant other Available Help at Discharge: Family;Available 24 hours/day Type of Home: House Home Access: Stairs to enter [employer] of Steps: 3 Entrance Stairs-Rails: Right Home Layout: One level     Bathroom Shower/Tub: Chief Strategy Officer: Standard     Home Equipment: Agricultural Consultant (2 wheels);Rollator (4 wheels);Cane - single point;Shower seat (home O2 2L per pt; 3-wheeled Rollator)   Additional Comments: Wife is currently in hospital with PNA. Home set-up confirmed by friend who was present during session.      Prior Functioning/Environment Prior Level of Function : Independent/Modified Independent;Needs assist;Patient poor historian/Family not available (Pt offering conflicting information about PLOF)             Mobility Comments: Pt reports use of 3-wheeled Rollator in the home, but also reports he has not been getting OOB at home. Uncertain from pt report how long it has been since he has been getting OOB consistently. Friend present, but states she is also uncertain. ADLs Comments: Pt reporting both that he is Independent with all ADLs and that he is needing assist for bathing and dressing. Pt reports he typically washes up at the sink or bed due to difficulty getting in/out of tub/shower. Pt also reports he receives assistance for IADLs, but was unable to report who assists him or how often.    OT Problem List: Decreased strength;Decreased activity tolerance;Decreased coordination;Decreased safety awareness;Decreased  knowledge of precautions;Cardiopulmonary status limiting activity;Pain   OT Treatment/Interventions: Self-care/ADL training;Therapeutic exercise;Energy conservation;DME and/or AE instruction;Therapeutic activities;Cognitive remediation/compensation;Patient/family education;Balance training      OT Goals(Current goals can be found in the care plan section)   Acute Rehab OT Goals Patient Stated Goal: to talk to his friend OT Goal Formulation: With patient Time For Goal Achievement: 12/19/23 Potential to Achieve Goals: Fair ADL Goals Pt Will Perform Grooming: with set-up;with supervision;sitting (sitting EOB with Good balance for 5 or more minutes) Pt Will Perform Upper Body Bathing: with supervision;sitting (with adaptive equipment as needed) Pt Will Perform Lower Body Bathing: with contact guard assist;sitting/lateral leans;sit to/from stand (with adaptive equipment as needed) Pt Will Perform Lower Body Dressing: with contact guard assist;sitting/lateral leans;sit to/from stand (with adaptive equipment as needed) Pt Will Transfer to Toilet: with contact guard assist;ambulating;regular height toilet (with least restrictive AD) Pt Will Perform Toileting - Clothing Manipulation and hygiene: with contact guard assist;sitting/lateral leans;sit to/from stand Pt/caregiver will Perform Home Exercise Program: Increased strength;Both right and left upper extremity;With theraband;With Supervision;With written HEP provided (Increased activity tolerance)   OT Frequency:  Min 2X/week    Co-evaluation              AM-PAC OT 6 Clicks Daily Activity     Outcome Measure Help from another person eating meals?: A Little Help from another person taking care of personal grooming?: A Little Help from another person  toileting, which includes using toliet, bedpan, or urinal?: A Lot Help from another person bathing (including washing, rinsing, drying)?: A Lot Help from another person to put on and  taking off regular upper body clothing?: A Little Help from another person to put on and taking off regular lower body clothing?: A Lot 6 Click Score: 15   End of Session Equipment Utilized During Treatment: Oxygen (5L through HFNC) Nurse Communication: Mobility status;Other (comment) (OT eval limited due to pt fatigue and self-limiting behaviors)  Activity Tolerance: Patient limited by fatigue;Other (comment) (Pt with self-limiting with pt focused on visisting with friend even with friend encouraging pt to participate in OT session.) Patient left: in bed;with call bell/phone within reach;with bed alarm set;with family/visitor present  OT Visit Diagnosis: Muscle weakness (generalized) (M62.81);Other symptoms and signs involving cognitive function;Pain;Other (comment) (decreased activity tolerance)                Time: 8656-8641 OT Time Calculation (min): 15 min Charges:  OT General Charges $OT Visit: 1 Visit OT Evaluation $OT Eval Low Complexity: 1 Low  Margarie Rockey HERO., OTR/L, MA Acute Rehab [phone]   Margarie FORBES Horns 12/05/2023, 2:34 PM

## 2023-12-05 NOTE — Progress Notes (Signed)
 Patient states he is not ready to be placed on Bipap, will be on standby.

## 2023-12-06 DIAGNOSIS — Z7189 Other specified counseling: Secondary | ICD-10-CM

## 2023-12-06 DIAGNOSIS — G9341 Metabolic encephalopathy: Secondary | ICD-10-CM | POA: Diagnosis not present

## 2023-12-06 DIAGNOSIS — Z515 Encounter for palliative care: Secondary | ICD-10-CM

## 2023-12-06 DIAGNOSIS — Z711 Person with feared health complaint in whom no diagnosis is made: Secondary | ICD-10-CM

## 2023-12-06 LAB — CBC
HCT: 39.4 % (ref 39.0–52.0)
Hemoglobin: 12.2 g/dL — ABNORMAL LOW (ref 13.0–17.0)
MCH: 28.9 pg (ref 26.0–34.0)
MCHC: 31 g/dL (ref 30.0–36.0)
MCV: 93.4 fL (ref 80.0–100.0)
Platelets: 205 K/uL (ref 150–400)
RBC: 4.22 MIL/uL (ref 4.22–5.81)
RDW: 15.9 % — ABNORMAL HIGH (ref 11.5–15.5)
WBC: 11.7 K/uL — ABNORMAL HIGH (ref 4.0–10.5)
nRBC: 0 % (ref 0.0–0.2)

## 2023-12-06 LAB — GLUCOSE, CAPILLARY: Glucose-Capillary: 102 mg/dL — ABNORMAL HIGH (ref 70–99)

## 2023-12-06 LAB — HEPARIN LEVEL (UNFRACTIONATED): Heparin Unfractionated: 0.3 [IU]/mL (ref 0.30–0.70)

## 2023-12-06 MED ORDER — SODIUM CHLORIDE 0.9 % IV BOLUS
250.0000 mL | Freq: Once | INTRAVENOUS | Status: DC
Start: 2023-12-06 — End: 2023-12-08

## 2023-12-06 MED ORDER — DILTIAZEM HCL 25 MG/5ML IV SOLN
5.0000 mg | Freq: Once | INTRAVENOUS | Status: DC
  Filled 2023-12-06: qty 5

## 2023-12-06 MED ORDER — DIGOXIN 0.25 MG/ML IJ SOLN
0.2500 mg | Freq: Once | INTRAMUSCULAR | Status: AC
  Administered 2023-12-06: 0.25 mg via INTRAVENOUS
  Filled 2023-12-06 (×2): qty 2

## 2023-12-06 NOTE — Progress Notes (Addendum)
 0002: CCMD notified this RN that pt is running A-Fib RVR with HR 120-140s  0010: On-call Dr notified, new order placed.  9950: pt is confused talking about his father. This RN gave the patient IV medicine per MD order. Pt is asking what medicines is he getting through his arm. This RN explained to the patient that he is in the hospital and he is getting a medicine for his heart rhythm and antibiotics. Pt replies that he does not need the antibiotics and wants to remove the IV from his hand. This RN called for another staff to get a mittens for safety pre-caution and mittens are applied.  48: Pt's son walks into the room.   18: RN Melanie responded to the son's call to find that the son took off the mittens off the patient's hands. RN Andrea explained to the son that it was used because pt is confused and trying to remove the IV. Pt's son replied say  I don't like it.  92: CCMD called and notified this RN that HR dropped to 99-102. MD notified.  0145: RN was notified that the patient is on a bed pan, and the son will call when the patient is finished.  0155: This RN walked by the patient's room and noticed the call light is blinking and notified the NT that the patient is finished and about to collect supplies to clean the patient and agreed to go to the patient room together after she reply to another patient request. This RN walked into the room to drop off the supplies needed for cleaning the patient to notice the bed pan is placed in the sink. This RN walked out the room to get shampoo bottle when the patient's son complains about his dad being on the bed pan for long time. This apologized to the patient's son and that we are short staff tonight and the moment this RN noticed the light, started to get ready to get the patient getting assist. The patient's son states 'I do not like that and ask to speak to the charge nurse. Charge nurse is notified.   0210: Pt converted to NSR. On call Dr  is notified.  56: NT called this RN for help. This RN walked into the room and found the patient setting at the edge of the bed after he had a bowel movement. This RN with the help of another RN and NT cleaned the patient, washed his skin with wash cloth soaked with warm water  and shampoo, cleaned the bed and sheets are changed, new gown applied and pt returned back to bed.

## 2023-12-06 NOTE — TOC Initial Note (Addendum)
 Transition of Care [facility]) - Initial/Assessment Note    Patient Details  Name: Angel Cooper MRN: [id number] Date of Birth: 1930/07/15  [facility] CM/SW Contact:    Isaiah Public, LCSWA Phone Number: 12/06/2023, 9:24 AM  Clinical Narrative:                  CSW received consult for possible SNF placement at time of discharge. Due to patients current orientation, CSW spoke with patients son [name] regarding PT recommendation of SNF placement at time of discharge.PTA patients son reports patient comes from home with spouse.Patients son expressed understanding of PT recommendation and is agreeable to SNF placement for patient at time of discharge. Patients son gave CSW permission to fax out initial referral for SNF placement. CSW discussed insurance authorization process and will provide Medicare SNF ratings list with accepted SNF bed offers when available . Patients son gave CSW permission to reach out to financial counseling to screen patient for medicaid. CSW [employer] in financial counseling and requested to screen patient for medicaid.All questions answered. No further questions reported at this time.  Expected Discharge Plan: Skilled Nursing Facility Barriers to Discharge: Continued Medical Work up   Patient Goals and CMS Choice     Choice offered to / list presented to : Adult Children (patients son [name])      Expected Discharge Plan and Services In-house Referral: Clinical Social Work     Living arrangements for the past 2 months: Single Family Home                                      Prior Living Arrangements/Services Living arrangements for the past 2 months: Single Family Home Lives with:: Spouse Patient language and need for interpreter reviewed:: Yes        Need for Family Participation in Patient Care: Yes (Comment) Care giver support system in place?: Yes (comment)   Criminal Activity/Legal Involvement Pertinent to Current  Situation/Hospitalization: No - Comment as needed  Activities of Daily Living   ADL Screening (condition at time of admission) Independently performs ADLs?: No Does the patient have a NEW difficulty with bathing/dressing/toileting/self-feeding that is expected to last >3 days?: Yes (Initiates electronic notice to provider for possible OT consult) Does the patient have a NEW difficulty with getting in/out of bed, walking, or climbing stairs that is expected to last >3 days?: Yes (Initiates electronic notice to provider for possible PT consult) Does the patient have a NEW difficulty with communication that is expected to last >3 days?: No Is the patient deaf or have difficulty hearing?: No Does the patient have difficulty seeing, even when wearing glasses/contacts?: No Does the patient have difficulty concentrating, remembering, or making decisions?: No  Permission Sought/Granted Permission sought to share information with : Case Manager, Magazine Features Editor, Family Supports                Emotional Assessment       Orientation: : Oriented to Self Alcohol / Substance Use: Not Applicable Psych Involvement: No (comment)  Admission diagnosis:  NSTEMI (non-ST elevated myocardial infarction) (HCC) [I21.4] Acute respiratory failure with hypercapnia (HCC) [J96.02] Generalized weakness [R53.1] Altered mental status, unspecified altered mental status type [R41.82] Acute metabolic encephalopathy [G93.41] Patient Active Problem List   Diagnosis Date Noted   Acute metabolic encephalopathy 12/04/2023   Elevated troponin 12/04/2023   NSTEMI (non-ST elevated myocardial infarction) (HCC)  12/04/2023   Left ventricular dysfunction 04/18/2021   Acute respiratory failure with hypoxia and hypercapnia (HCC) 03/06/2021   Pleural effusion 03/06/2021   Acute on chronic combined systolic and diastolic CHF (congestive heart failure) (HCC) 03/05/2021   Protein-calorie malnutrition, severe  03/04/2021   Right shoulder pain due to gout 03/03/2021   History of COVID-19 03/02/2021   Partial small bowel obstruction (HCC) 03/01/2021   Personal history of other malignant neoplasm of kidney 03/01/2021   CAP (community acquired pneumonia) 03/01/2021   Knee pain, bilateral 01/29/2021   Unable to walk 01/29/2021   Gouty arthritis 12/07/2018   Leukocytosis 12/07/2018   Essential hypertension 12/07/2018   Anemia of chronic disease 12/07/2018   Atrial fibrillation with RVR (HCC) 01/28/2013   Coronary artery disease 01/28/2013   Chronic kidney disease, stage 3b (HCC) 01/28/2013   Ureteral cancer (HCC) 01/24/2013   PCP:  Charlott Dorn LABOR, MD Pharmacy:   [facility] - [place], [place] - [address] [place] [address] Phone: [phone] Fax: [phone]  [facility] [address] [place] [address] Phone: [phone] Fax: [phone]     Social Drivers of Health (SDOH) Social History: SDOH Screenings   Food Insecurity: No Food Insecurity (12/04/2023)  Housing: Low Risk  (12/04/2023)  Transportation Needs: No Transportation Needs (12/04/2023)  Utilities: Not At Risk (12/04/2023)  Social Connections: Socially Isolated (12/04/2023)  Tobacco Use: Medium Risk (12/04/2023)   SDOH Interventions:     Readmission Risk Interventions     No data to display

## 2023-12-06 NOTE — TOC Progression Note (Signed)
 Transition of Care [facility]) - Progression Note    Patient Details  Name: Angel Cooper MRN: [id number] Date of Birth: Jun 23, 1930  [facility]) CM/SW Contact  Marval Gell, RN Phone Number: 12/06/2023, 11:02 AM  Clinical Narrative:     Notified by PMT NP that patient and family would like home hospice care with [facility]. I addedd [facility] to chat for them to initiate referral. Patient will need large DME, hospital bed and likely oxygen.  Anticipate DC to home tomorrow after DME delivered and family can set up home   Expected Discharge Plan: Skilled Nursing Facility Barriers to Discharge: Continued Medical Work up               Expected Discharge Plan and Services In-house Referral: Clinical Social Work     Living arrangements for the past 2 months: Single Family Home                             HH Agency: [facility] Date [facility] Agency Contacted: 12/06/23 Time HH Agency Contacted: 1101 Representative spoke with at [facility] Agency: [facility] Secure Chat group   Social Drivers of Health (SDOH) Interventions SDOH Screenings   Food Insecurity: No Food Insecurity (12/04/2023)  Housing: Low Risk  (12/04/2023)  Transportation Needs: No Transportation Needs (12/04/2023)  Utilities: Not At Risk (12/04/2023)  Social Connections: Socially Isolated (12/04/2023)  Tobacco Use: Medium Risk (12/04/2023)    Readmission Risk Interventions     No data to display

## 2023-12-06 NOTE — Consult Note (Signed)
 Palliative Medicine Inpatient Consult Note  Consulting Provider:  Vernon Ranks, MD   Reason for consult:   Palliative Care Consult Services Palliative Medicine Consult  Reason for Consult? 89 year old patient with multiple comorbidities, now with acute respiratory failure and NSTEMI, not a candidate for any intervention.  Very poor prognosis.  I suspect he will not do well    12/06/2023  HPI:  Per intake H&P --> Angel Cooper is a 89 y.o. male with medical history significant of with history of CKD stage IIIb, A-fib on Eliquis , HFrEF, CAD status post CABG, hypertension, hyperlipidemia, gout, GERD, PUD, ureteral carcinoma status post right nephroureterectomy in 2014 presented to the ED via EMS for evaluation of altered mental status and generalized weakness.  Palliative care has been requested to support additional goals of care conversations.   Clinical Assessment/Goals of Care:  *Please note that this is a verbal dictation therefore any spelling or grammatical errors are due to the Dragon Medical One system interpretation.  I have reviewed medical records including EPIC notes, labs and imaging, received report from bedside RN, assessed the patient who is lying in bed awake to self though easily disoriented.    I met with Angel Cooper, Angel Cooper and Angel Cooper to further discuss diagnosis prognosis, GOC, EOL wishes, disposition and options.   I introduced Palliative Medicine as specialized medical care for people living with serious illness. It focuses on providing relief from the symptoms and stress of a serious illness. The goal is to improve quality of life for both the patient and the family.  Medical History Review and Understanding:  Angel Cooper has a past medical history significant for atrial fibrillation, coronary artery disease status post CABG, hypertension, hyperlipidemia, GERD, ureteral carcinoma, stage III chronic kidney disease, and peptic ulcer disease.  Social  History:  Angel Cooper lives in [place], [place] .  He is married though his wife sadly recently suffered a stroke.  He has 2 Cooper, 4 grandchildren, and 6 great-grandchildren.  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  He enjoyed from a hobby perspective pedaling in the garage.  He otherwise enjoyed hanging around his home.  Functional and Nutritional State:  Prior to hospitalization Angel Cooper and his wife are living together.  He had had both a functional and nutritional decline over the past few months which became more pronounced over the past few weeks.  His Cooper live locally and check in on him frequently.  Advance Directives:  A detailed discussion was had today regarding advanced directives.  Angel Cooper does not have advanced directives on file.  Unfortunately due to his wife's current health state his 2 Cooper are his surrogate decision makers.  Code Status:  Concepts specific to code status, artifical feeding and hydration, continued IV antibiotics and rehospitalization was had.  The difference between a aggressive medical intervention path  and a palliative comfort care path for this patient at this time was had.   I was able to confirm patient's DO NOT RESUSCITATE DO NOT INTUBATE CODE STATUS.  Discussion:  Patient's Cooper and I reviewed the reasoning for admission inclusive of Angel Cooper's altered mental status.  We discussed the reasons for this in the setting of his hypoxemic respiratory failure due to a community-acquired pneumonia.  We reviewed delirium as a state of acute mental confusion that causes a sudden change in a person's cognitive function, awareness, and behavior.  We reviewed that patients who have an underlying history of cognitive impairment and/or dementia are at higher risk for delirium in the hospital setting.  We discussed Angel Cooper's declining health state over the past few months.  He had at 1 point in time been evaluated by hospice but to some degree had rebounded therefore his  family did not pursue this.  We discussed despite him getting recurrent physical therapy in the home he has continued to decline and his weakness has become more marked.  We reviewed patient's hospital stay in the setting of his pneumonia in the setting of his already at enzymes being elevated due to stress and demand.  We reviewed unfortunately in the setting of his underlying coronary artery disease that he is not a candidate for advanced interventions.  We reviewed even if he were the concern that his frailty would preclude any benefit associated with the intervention.  Discussed options moving forward inclusive of either continued care which is considered aggressive at this juncture for comfort measures.  At this point in time patient's family would like to continue antibiotics and IV fluids at least until the point of discharge.  We reviewed hospice care. I described hospice as a service for patients who have a life expectancy of 6 months or less. The goal of hospice is the preservation of dignity and quality at the end phases of life. Under hospice care, the focus changes from curative to symptom relief.   At this point in time patient's Cooper would like for Angel Cooper to transition home with hospice.  They understand he has limited time left in his body has not been able to maintain for quite some time now.  We reviewed that they prefer [facility] hospice as if inpatient is needed at some point in time [facility] is the most proximal to them.  I shared that steps on my end would be to make a referral to case management who would then communicate with hospice directly.  We reviewed it often will take about 24 hours to get the home set up with the durable medical equipment prior to transfer.  Discussed the importance of continued conversation with family and their  medical providers regarding overall plan of care and treatment options, ensuring decisions are within the context of the patients values  and GOCs.  Decision Maker: Angel Cooper, Angel Cooper (Son): [phone] (Home Phone)   SUMMARY OF RECOMMENDATIONS   DNAR/DNI  Comprehensive conversations held with patient's Cooper who are in agreement with [facility] transitioning home with hospice given his declining health over the past few months to weeks  DC Tele - as discussed with patient and family  Continue IVF and Antibiotics until discharge - family aware this will not be given once transitioned to home  Appreciate TOC making a referral to [facility] hospice  Ongoing palliative care support  Code Status/Advance Care Planning: DNAR/DNI  Palliative Prophylaxis:  Aspiration, Bowel Regimen, Delirium Protocol, Frequent Pain Assessment, Oral Care, Palliative Wound Care, and Turn Reposition  Additional Recommendations (Limitations, Scope, Preferences): Continue current care  Psycho-social/Spiritual:  Desire for further Chaplaincy support: Not presently Additional Recommendations: Education on disease burden   Prognosis: < 6 months  Discharge Planning: Discharge to home with hospice.   Vitals:   12/06/23 0426 12/06/23 0818  BP:  (!) 131/58  Pulse:  72  Resp: 20 (!) 24  Temp:  99.2 F (37.3 C)  SpO2:  100%    Intake/Output Summary (Last 24 hours) at 12/06/2023 1105 Last data filed at 12/06/2023 0947 Gross per 24 hour  Intake 120 ml  Output --  Net 120 ml   Last Weight  Most recent update: 12/05/2023  9:22 AM    Weight  41.4 kg (91 lb 3.2 oz)            LABS: CBC:    Component Value Date/Time   WBC 11.7 (H) 12/06/2023 0236   HGB 12.2 (L) 12/06/2023 0236   HGB 11.2 (L) 02/22/2013 1032   HCT 39.4 12/06/2023 0236   HCT 34.2 (L) 02/22/2013 1032   PLT 205 12/06/2023 0236   PLT 344 02/22/2013 1032   MCV 93.4 12/06/2023 0236   MCV 91.7 02/22/2013 1032   NEUTROABS 8.4 (H) 12/03/2023 2156   NEUTROABS 10.5 (H) 02/22/2013 1032   LYMPHSABS 0.9 12/03/2023 2156   LYMPHSABS 2.8 02/22/2013 1032   MONOABS 0.8 12/03/2023  2156   MONOABS 1.2 (H) 02/22/2013 1032   EOSABS 0.0 12/03/2023 2156   EOSABS 0.8 (H) 02/22/2013 1032   BASOSABS 0.0 12/03/2023 2156   BASOSABS 0.1 02/22/2013 1032   Comprehensive Metabolic Panel:    Component Value Date/Time   NA 143 12/05/2023 0842   NA 139 02/22/2013 1032   K 5.0 12/05/2023 0842   K 5.0 02/22/2013 1032   CL 102 12/05/2023 0842   CO2 29 12/05/2023 0842   CO2 23 02/22/2013 1032   BUN 44 (H) 12/05/2023 0842   BUN 24.7 02/22/2013 1032   CREATININE 1.31 (H) 12/05/2023 0842   CREATININE 1.5 (H) 02/22/2013 1032   GLUCOSE 78 12/05/2023 0842   GLUCOSE 100 02/22/2013 1032   CALCIUM  8.4 (L) 12/05/2023 0842   CALCIUM  9.1 02/22/2013 1032   AST 51 (H) 12/05/2023 0842   AST 11 02/22/2013 1032   ALT 39 12/05/2023 0842   ALT <6 02/22/2013 1032   ALKPHOS 51 12/05/2023 0842   ALKPHOS 75 02/22/2013 1032   BILITOT 0.4 12/05/2023 0842   BILITOT 0.36 02/22/2013 1032   PROT 5.6 (L) 12/05/2023 0842   PROT 6.9 02/22/2013 1032   ALBUMIN  3.1 (L) 12/05/2023 0842   ALBUMIN  3.6 02/22/2013 1032   Gen: Elderly Caucasian male chronically ill in appearance HEENT: Dry mucous membranes CV: Regular rate and irregular rhythm  PULM: On room air slightly tachypneic ABD: soft/nontender  EXT: Muscle wasting in all 4 extremities Neuro: Disoriented  PPS: 20%   This conversation/these recommendations were discussed with patient primary care team, Dr. Vernon ______________________________________________________ Rosaline Becton [facility] Palliative Medicine Team Team Cell Phone: [phone] Please utilize secure chat with additional questions, if there is no response within 30 minutes please call the above phone number  Total Time: 75 Billing based on MDM: High  Palliative Medicine Team providers are available by phone from 7am to 7pm daily and can be reached through the team cell phone.  Should this patient require assistance outside of these hours, please call the patient's  attending physician.

## 2023-12-06 NOTE — Progress Notes (Signed)
 [facility] [phone] [facility] Liaison Note   Received request from [facility]bbie for hospice services at home after discharge. Spoke with son [name] via phone to initiate education related to hospice philosophy, services and team approach to care. Son verbalized understanding of information given. States both his mother and father are in the hospital and he will need time on tomorrow to clear space so that equipment can be delivered on tomorrow afternoon. Per discussion,  patient will likely discharge Tuesday via [facility].     DME needs discussed. Patient has the following equipment in the home: Bedside Commode and Wheelchair  Son requests the following equipment for delivery:  Hospital bed (full rails),  overbed table, and oxygen.   Please send signed and completed DNR home with patient/family. Please provide prescriptions at discharge as needed to ensure ongoing symptom management.   [facility] information and contact numbers given to [place]. Please call with any concerns.   Thank you for the opportunity to participate in this patient's care.    Nat Babe, BSN, RN [employer] [phone]

## 2023-12-06 NOTE — Plan of Care (Signed)
 Patient declining, to go home with hospice

## 2023-12-06 NOTE — Progress Notes (Signed)
 PHARMACY - ANTICOAGULATION CONSULT NOTE  Pharmacy Consult for heparin  Indication: chest pain/ACS  Allergies  Allergen Reactions   Aspirin Other (See Comments)    Stomach pain    Chlorhexidine Other (See Comments)    CAUSED SKIN IRRITATION AND BURNING SENSATION   Enalapril Other (See Comments)    Cannot take due to kidney compromise   Gabapentin Other (See Comments)    Caused nightmares   Statins Other (See Comments)    Myalgia    Patient Measurements: Height: 5' 7 (170.2 cm) Weight: 41.4 kg (91 lb 3.2 oz) IBW/kg (Calculated) : 66.1 HEPARIN  DW (KG): 41.6  Vital Signs: Temp: 98.1 F (36.7 C) (11/09 0422) Temp Source: Oral (11/09 0422) BP: 158/86 (11/09 0422) Pulse Rate: 77 (11/09 0422)  Labs: Recent Labs    12/03/23 2156 12/03/23 2159 12/04/23 0510 12/04/23 0715 12/04/23 0750 12/04/23 2142 12/05/23 0258 12/05/23 0842 12/06/23 0236  HGB 16.2 16.7  16.3  --   --   --   --  12.5*  --  12.2*  HCT 53.8* 49.0  48.0  --   --   --   --  40.7  --  39.4  PLT 262  --   --   --   --   --  218  --  205  HEPARINUNFRC  --   --   --   --    < > 0.30 0.47  --  0.30  CREATININE 1.51* 1.60*  --  1.80*  --   --   --  1.31*  --   TROPONINIHS 917*  --  4,552*  --   --   --   --   --   --    < > = values in this interval not displayed.    Estimated Creatinine Clearance (by C-G formula based on SCr of 1.31 mg/dL (H)) Male: 81.6 mL/min (A) Male: 21.5 mL/min (A)   Medical History: Past Medical History:  Diagnosis Date   Atrial fibrillation (HCC)    2015  W/ RVR   Bladder tumor    CHF (congestive heart failure) (HCC) 2015   CKD (chronic kidney disease), stage III (HCC)    Coronary artery disease    NO LONGER SEES CARDIOLOGIST - LAST SAW DR. HILARIO SHARPS 2 OR MORE YRS AGO-PT SEES PCP DR NORLEEN LEWIS   GERD (gastroesophageal reflux disease)    Hiatal hernia    History of colon polyps    History of gastric ulcer    1989  peptic and duodenal   History of MI (myocardial  infarction)    1995   History of non-ST elevation myocardial infarction (NSTEMI)    07-05-1999  s/p cabg   Hyperlipidemia    Hypertension    OA (osteoarthritis)    Osteomyelitis (HCC) 12/07/2018   S/P CABG (coronary artery bypass graft)    06/ 2001   Ureteral carcinoma, right [facility]) urologist-  dr herrick/  oncologist- dr amadeo   dx 2014  s/p  right nephroureterectomy 01-24-2013 (T3, N0) invasive high grade urothelial carcinoma--     Assessment: 89yo male c/o fatigue, found in ED w/ AMS and hypoxia, during w/u troponin found to be elevated >> to begin heparin ; pt has been on DOAC in the past for PAF but stopped in 2023 and expressed to outpt cards that he is not interested in resuming.  Heparin  level 0.30 is therapeutic with heparin  running at 850 units/hr. Hgb (12.2) and PLTs (205) are stable. Patient renal function  is stable. Per RN, no signs of bleeding. Of note, the infusion was stopped for approximately 90 minutes after the level was collected due to line access issues. The heparin  infusion is set to stop a 10am this morning and the patient will be transitioned back to their home apixaban .   Goal of Therapy:  Heparin  level 0.3-0.7 units/ml Monitor platelets by anticoagulation protocol: Yes   Plan:  Continue heparin  at 850 units/hr until 10am Monitor for any signs/symptoms of bleeding Patient will be transitioned to home apixaban  dose at 10am  Thank you for allowing pharmacy to be involved with this patient's care.  Mendel Barter, PharmD PGY1 Clinical Pharmacist [facility]  12/06/2023 8:13 AM

## 2023-12-06 NOTE — Progress Notes (Signed)
 PROGRESS NOTE    Angel Cooper  FMW:[id number] DOB: 1930-06-10 DOA: 12/03/2023 PCP: Charlott Dorn LABOR, MD   Brief Narrative:  Angel Cooper is a 89 y.o. male with medical history significant of with history of CKD stage IIIb, A-fib on Eliquis , HFrEF, CAD status post CABG, hypertension, hyperlipidemia, gout, GERD, PUD, ureteral carcinoma status post right nephroureterectomy in 2014 presented to the ED via EMS for evaluation of altered mental status and generalized weakness.  Patient arrived to the ED on 15 L oxygen via nonrebreather.  Tachycardic on arrival but afebrile and not hypotensive.  Had mild hyperkalemia.  UA not suggestive of infection.  VBG with pH 7.30 and pCO2 68.9.  COVID/influenza/RSV PCR negative.  Ethanol level <15.  Ammonia level normal.  Initial troponin 917.  Chest x-ray showing patchy bilateral airspace disease superimposed on background scarring.  Negative right upper quadrant abdominal ultrasound.  CT abdomen pelvis showing no definite acute findings in the abdomen or pelvis, small right pleural effusion, and moderate hiatal hernia.  CT head showing no acute intracranial abnormality.  Patient was given aspirin, ceftriaxone , azithromycin , 500 mL IV fluids, and started on heparin  drip for concern of NSTEMI after he was seen by cardiology.  He was placed on bipap and admitted under hospital service.  Details below.  Assessment & Plan:   Principal Problem:   Acute metabolic encephalopathy Active Problems:   Coronary artery disease   Chronic kidney disease, stage 3b (HCC)   Acute respiratory failure with hypoxia and hypercapnia (HCC)   Elevated troponin   NSTEMI (non-ST elevated myocardial infarction) (HCC)  Acute metabolic encephalopathy, POA: CT head showing no acute intracranial abnormality.  UDS positive for opiates and he does take hydrocodone  at home but according to his son patient takes opiates only on occasion and his brother might have given the patient one  dose yesterday.  TSH slightly elevated 4.6.   EEG negative for epileptiform activity.  Rest of the blood workup negative. VBG confirmed hypercapnia, likely the source of acute encephalopathy which improved with the improvement in hypercapnia.   Acute hypoxemic hypercapnic respiratory failure and respiratory failure secondary to presumed possible community-acquired pneumonia, POA: Patient arrived to the ED on 15 L nonrebreather.  Initial VBG with pH 7.30 and pCO2 68.9.  He was placed on BiPAP in the ED.  Chest x-ray showing patchy bilateral airspace disease superimposed on background scarring.  No fever or leukocytosis.  COVID/influenza/RSV PCR negative.  Started on ceftriaxone  and azithromycin , full 20 pathogen respiratory viral panel negative.  Doing much better, weaned from BiPAP to 3 L currently.  Continue antibiotics.  Continue incentive spirometry.   History of CAD status post CABG/elevated troponin/NSTEMI Troponin 917> 4552 and EKG without acute ischemic changes.  Initially cardiology evaluated the patient and felt that troponin elevation is likely secondary to demand ischemia rather than ACS however based on significant jump on troponin and echo showing left ventricular global hypokinesis, he qualified for NSTEMI however due to patient's overall comorbidities and cachectic state, they recommended continuing medical management with heparin  for 48 hours and then transitioning back to Eliquis , as patient is deemed to be not a candidate for any cardiac catheterization.  Currently on renally dosed Eliquis .   Mild hyperkalemia Resolved with Lokelma.   Elevated transaminases AST 131, ALT 74 (transaminases elevated on previous labs done in February 2023 as well and no significant change), alk phos and T. bili normal.  Negative right upper quadrant abdominal ultrasound.  Likely due to acute stress.  LFTs improving.   Chronic combined systolic and diastolic dysfunction: Last echo done in February 2023  showing EF 35 to 40%, grade 1 diastolic dysfunction, mild mitral regurgitation, and mild to moderate aortic regurgitation.  CT showing small right pleural effusion.  Repeat echo shows improved ejection fraction 45 to 50%.  I resumed Lasix  yesterday however cardiology discontinued that.  Will defer to them for management of this.   CKD stage IIIb Baseline creatinine appears to be around 1.6.  Currently improved and down to 1.3.   Paroxysmal A-fib Was on diltiazem  PTA, transition to Toprol -XL due to systolic congestive heart failure, on renally dosed Eliquis , rates controlled.  Events noted from last night, he went into A-fib RVR, given a dose of diltiazem  IV, converted to sinus rhythm afterwards.   Hypertension: Blood pressure controlled, continue Toprol -XL.  Gout: Does not take allopurinol  at home.  GERD/PUD: PPI.  Moderate to severe protein calorie malnutrition: Have ordered protein shakes supplements and consulted dietitian.  GOC: Palliative care discussed with the Elder son Ransom yesterday.  He was leaning towards hospice.  When I spoke to him at the bedside today.  He was still waiting for his other brother to make a decision however later on both brothers together made a decision to move on with home with hospice.  Plan is to discharge once hospital bed and rest of the DME's are arranged, hopefully tomorrow.  In the meantime, they would like to continue all current care including antibiotics but discontinue telemetry.  DVT prophylaxis: apixaban  (ELIQUIS ) tablet 2.5 mg Start: 12/06/23 1000   Code Status: Limited: Do not attempt resuscitation (DNR) -DNR-LIMITED -Do Not Intubate/DNI   Family Communication: Son at the bedside.  Status is: Inpatient Remains inpatient appropriate because: Still hypoxic and requiring oxygen.  Plan for discharge to home with hospice once all requirements are met.  Estimated body mass index is 14.28 kg/m as calculated from the following:   Height as of this  encounter: 5' 7 (1.702 m).   Weight as of this encounter: 41.4 kg.    Nutritional Assessment: Body mass index is 14.28 kg/m.[name] Seen by dietician.  I agree with the assessment and plan as outlined below: Nutrition Status:    Skin Assessment: I have examined the patient's skin and I agree with the wound assessment as performed by the wound care RN as outlined below:    Consultants:  Cardiology  Procedures:  As above  Antimicrobials:  Anti-infectives (From admission, onward)    Start     Dose/Rate Route Frequency Ordered Stop   12/05/23 0000  azithromycin  (ZITHROMAX ) 500 mg in sodium chloride  0.9 % 250 mL IVPB        500 mg 250 mL/hr over 60 Minutes Intravenous Every 24 hours 12/04/23 0543     12/04/23 2200  cefTRIAXone  (ROCEPHIN ) 1 g in sodium chloride  0.9 % 100 mL IVPB        1 g 200 mL/hr over 30 Minutes Intravenous Every 24 hours 12/04/23 0543     12/03/23 2245  cefTRIAXone  (ROCEPHIN ) 2 g in sodium chloride  0.9 % 100 mL IVPB        2 g 200 mL/hr over 30 Minutes Intravenous  Once 12/03/23 2232 12/03/23 2329   12/03/23 2245  azithromycin  (ZITHROMAX ) 500 mg in sodium chloride  0.9 % 250 mL IVPB        500 mg 250 mL/hr over 60 Minutes Intravenous  Once 12/03/23 2232 12/04/23 0040         Subjective:  Patient seen and examined.  Lethargic today.  Able to respond to questions.  Oriented to self only.  Thinks that he is at home.  Appears very tired.  Objective: Vitals:   12/06/23 0047 12/06/23 0422 12/06/23 0426 12/06/23 0818  BP: 110/70 (!) 158/86  (!) 131/58  Pulse: (!) 130 77  72  Resp:  (!) 26 20 (!) 24  Temp:  98.1 F (36.7 C)  99.2 F (37.3 C)  TempSrc:  Oral  Axillary  SpO2: 99% 100%  100%  Weight:      Height:       No intake or output data in the 24 hours ending 12/06/23 0909  Filed Weights   12/03/23 2119 12/04/23 1414 12/05/23 0831  Weight: 54 kg 41.6 kg 41.4 kg    Examination:  General exam: Appears lethargic and tired, keeps eyes closed during  the conversation. Respiratory system: Clear to auscultation. Respiratory effort normal. Cardiovascular system: S1 & S2 heard, RRR. No JVD, murmurs, rubs, gallops or clicks. No pedal edema. Gastrointestinal system: Abdomen is nondistended, soft and nontender. No organomegaly or masses felt. Normal bowel sounds heard. Central nervous system: Lethargic and oriented x 1.  Too weak to follow commands.  Data Reviewed: I have personally reviewed following labs and imaging studies  CBC: Recent Labs  Lab 12/03/23 2156 12/03/23 2159 12/05/23 0258 12/06/23 0236  WBC 10.2  --  11.5* 11.7*  NEUTROABS 8.4*  --   --   --   HGB 16.2 16.7  16.3 12.5* 12.2*  HCT 53.8* 49.0  48.0 40.7 39.4  MCV 94.9  --  93.3 93.4  PLT 262  --  218 205   Basic Metabolic Panel: Recent Labs  Lab 12/03/23 2156 12/03/23 2159 12/04/23 0715 12/04/23 1653 12/05/23 0842  NA 141 138  138 141  --  143  K 5.9* 5.5*  5.6* 5.8* 5.1 5.0  CL 98 100 100  --  102  CO2 29  --  29  --  29  GLUCOSE 113* 114* 81  --  78  BUN 44* 52* 47*  --  44*  CREATININE 1.51* 1.60* 1.80*  --  1.31*  CALCIUM  9.0  --  8.5*  --  8.4*   GFR: Estimated Creatinine Clearance (by C-G formula based on SCr of 1.31 mg/dL (H)) Male: 81.6 mL/min (A) Male: 21.5 mL/min (A) Liver Function Tests: Recent Labs  Lab 12/03/23 2156 12/04/23 0715 12/05/23 0842  AST 131* 106* 51*  ALT 74* 57* 39  ALKPHOS 62 54 51  BILITOT 0.8 0.9 0.4  PROT 7.4 6.2* 5.6*  ALBUMIN  4.2 3.5 3.1*   No results for input(s): LIPASE, AMYLASE in the last 168 hours. Recent Labs  Lab 12/04/23 0012 12/04/23 0713  AMMONIA 29 32   Coagulation Profile: No results for input(s): INR, PROTIME in the last 168 hours. Cardiac Enzymes: No results for input(s): CKTOTAL, CKMB, CKMBINDEX, TROPONINI in the last 168 hours. BNP (last 3 results) No results for input(s): PROBNP in the last 8760 hours. HbA1C: No results for input(s): HGBA1C in the last 72  hours. CBG: No results for input(s): GLUCAP in the last 168 hours. Lipid Profile: No results for input(s): CHOL, HDL, LDLCALC, TRIG, CHOLHDL, LDLDIRECT in the last 72 hours. Thyroid  Function Tests: Recent Labs    12/04/23 0510  TSH 4.592*   Anemia Panel: Recent Labs    12/04/23 1511  VITAMINB12 759   Sepsis Labs: Recent Labs  Lab 12/04/23 0715  PROCALCITON <0.10  Recent Results (from the past 240 hours)  Blood culture (routine x 2)     Status: None (Preliminary result)   Collection Time: 12/03/23  9:41 PM   Specimen: BLOOD  Result Value Ref Range Status   Specimen Description BLOOD SITE NOT SPECIFIED  Final   Special Requests   Final    BOTTLES DRAWN AEROBIC AND ANAEROBIC Blood Culture results may not be optimal due to an inadequate volume of blood received in culture bottles   Culture   Final    NO GROWTH 3 DAYS Performed at [facility], [address] [place], [place] [address]    Report Status PENDING  Incomplete  Blood culture (routine x 2)     Status: None (Preliminary result)   Collection Time: 12/03/23  9:51 PM   Specimen: BLOOD  Result Value Ref Range Status   Specimen Description BLOOD SITE NOT SPECIFIED  Final   Special Requests   Final    BOTTLES DRAWN AEROBIC AND ANAEROBIC Blood Culture results may not be optimal due to an inadequate volume of blood received in culture bottles   Culture   Final    NO GROWTH 3 DAYS Performed at [facility], [address], [place], [place] [address]    Report Status PENDING  Incomplete  Resp panel by RT-PCR (RSV, Flu A&B, Covid) Anterior Nasal Swab     Status: None   Collection Time: 12/03/23 11:19 PM   Specimen: Anterior Nasal Swab  Result Value Ref Range Status   SARS Coronavirus 2 by RT PCR NEGATIVE NEGATIVE Final   Influenza A by PCR NEGATIVE NEGATIVE Final   Influenza B by PCR NEGATIVE NEGATIVE Final    Comment: (NOTE) The Xpert Xpress SARS-CoV-2/FLU/RSV plus assay is intended as  an aid in the diagnosis of influenza from Nasopharyngeal swab specimens and should not be used as a sole basis for treatment. Nasal washings and aspirates are unacceptable for Xpert Xpress SARS-CoV-2/FLU/RSV testing.  Fact Sheet for Patients: bloggercourse.com  Fact Sheet for Healthcare Providers: seriousbroker.it  This test is not yet approved or cleared by the [place]  FDA and has been authorized for detection and/or diagnosis of SARS-CoV-2 by FDA under an Emergency Use Authorization (EUA). This EUA will remain in effect (meaning this test can be used) for the duration of the COVID-19 declaration under Section 564(b)(1) of the Act, 21 U.S.C. section 360bbb-3(b)(1), unless the authorization is terminated or revoked.     Resp Syncytial Virus by PCR NEGATIVE NEGATIVE Final    Comment: (NOTE) Fact Sheet for Patients: bloggercourse.com  Fact Sheet for Healthcare Providers: seriousbroker.it  This test is not yet approved or cleared by the [place]  FDA and has been authorized for detection and/or diagnosis of SARS-CoV-2 by FDA under an Emergency Use Authorization (EUA). This EUA will remain in effect (meaning this test can be used) for the duration of the COVID-19 declaration under Section 564(b)(1) of the Act, 21 U.S.C. section 360bbb-3(b)(1), unless the authorization is terminated or revoked.  Performed at [facility], [address] [place], [place] [address]   Urine Culture (for pregnant, neutropenic or urologic patients or patients with an indwelling urinary catheter)     Status: Abnormal   Collection Time: 12/04/23  5:44 AM   Specimen: Urine, Clean Catch  Result Value Ref Range Status   Specimen Description URINE, CLEAN CATCH  Final   Special Requests NONE  Final   Culture (A)  Final    <10,000  COLONIES/mL INSIGNIFICANT GROWTH Performed at [facility], [address] [place], [place] [address]    Report Status 12/05/2023 FINAL  Final  Respiratory (~20 pathogens) panel by PCR     Status: None   Collection Time: 12/04/23  5:47 AM   Specimen: Nasopharyngeal Swab; Respiratory  Result Value Ref Range Status   Adenovirus NOT DETECTED NOT DETECTED Final   Coronavirus 229E NOT DETECTED NOT DETECTED Final    Comment: (NOTE) The Coronavirus on the Respiratory Panel, DOES NOT test for the novel  Coronavirus (2019 nCoV)    Coronavirus HKU1 NOT DETECTED NOT DETECTED Final   Coronavirus NL63 NOT DETECTED NOT DETECTED Final   Coronavirus OC43 NOT DETECTED NOT DETECTED Final   Metapneumovirus NOT DETECTED NOT DETECTED Final   Rhinovirus / Enterovirus NOT DETECTED NOT DETECTED Final   Influenza A NOT DETECTED NOT DETECTED Final   Influenza B NOT DETECTED NOT DETECTED Final   Parainfluenza Virus 1 NOT DETECTED NOT DETECTED Final   Parainfluenza Virus 2 NOT DETECTED NOT DETECTED Final   Parainfluenza Virus 3 NOT DETECTED NOT DETECTED Final   Parainfluenza Virus 4 NOT DETECTED NOT DETECTED Final   Respiratory Syncytial Virus NOT DETECTED NOT DETECTED Final   Bordetella pertussis NOT DETECTED NOT DETECTED Final   Bordetella Parapertussis NOT DETECTED NOT DETECTED Final   Chlamydophila pneumoniae NOT DETECTED NOT DETECTED Final   Mycoplasma pneumoniae NOT DETECTED NOT DETECTED Final    Comment: Performed at [facility], [address] [place], [place] [address]     Radiology Studies: ECHOCARDIOGRAM COMPLETE Result Date: 12/04/2023    ECHOCARDIOGRAM REPORT   Patient Name:   Angel Cooper Date of Exam: 12/04/2023 Medical Rec #:  [id number]         Height:       67.0 in Accession #:    [id number]        Weight:       119.0 lb Date of Birth:  [birth year]          BSA:          1.622 m Patient Age:    89 years          BP:           120/60 mmHg Patient Gender: M                 HR:           89 bpm. Exam Location:  Inpatient Procedure: 2D  Echo, Cardiac Doppler and Color Doppler (Both Spectral and Color            Flow Doppler were utilized during procedure). Indications:    Elevated Troponin  History:        Patient has prior history of Echocardiogram examinations, most                 recent 03/03/2021. CAD, Prior CABG; Risk Factors:Hypertension.  Sonographer:    Koleen Popper RDCS Referring Phys: [id number] VASUNDHRA RATHORE IMPRESSIONS  1. Left ventricular ejection fraction, by estimation, is 45 to 50%. The left ventricle has mildly decreased function. The left ventricle demonstrates global hypokinesis. There is mild left ventricular hypertrophy. Left ventricular diastolic function could not be evaluated.  2. Right ventricular systolic function mild to moderately reduced. The right ventricular size is normal. Mildly increased right ventricular wall thickness. There is normal pulmonary artery systolic pressure.  3. The mitral valve is degenerative. No evidence of mitral valve regurgitation. No  evidence of mitral stenosis.  4. The aortic valve is tricuspid. Aortic valve regurgitation is mild to moderate. Aortic valve sclerosis is present, with no evidence of aortic valve stenosis.  5. Cannot exclude a small PFO. Comparison(s): A prior study was performed on 03/03/2021. LVEF 35-40%, global hypokinesis, akinetic anteroseptal/inferoseptal wall from base to apex consistent with prior CABG, grade 1 diastolic dysfunction, severe LAE, moderate RAE. FINDINGS  Left Ventricle: Left ventricular ejection fraction, by estimation, is 45 to 50%. The left ventricle has mildly decreased function. The left ventricle demonstrates global hypokinesis. The left ventricular internal cavity size was normal in size. There is  mild left ventricular hypertrophy. Abnormal (paradoxical) septal motion consistent with post-operative status. Left ventricular diastolic function could not be evaluated due to nondiagnostic images. Left ventricular diastolic function could not be  evaluated. Right Ventricle: The right ventricular size is normal. Mildly increased right ventricular wall thickness. Right ventricular systolic function mild to moderately reduced. There is normal pulmonary artery systolic pressure. The tricuspid regurgitant velocity is 2.82 m/s, and with an assumed right atrial pressure of 3 mmHg, the estimated right ventricular systolic pressure is 34.8 mmHg. Left Atrium: Left atrial size was normal in size. Right Atrium: Right atrial size was normal in size. Pericardium: There is no evidence of pericardial effusion. Mitral Valve: The mitral valve is degenerative in appearance. There is mild thickening of the mitral valve leaflet(s). There is mild calcification of the mitral valve leaflet(s). Mild mitral annular calcification. No evidence of mitral valve regurgitation. No evidence of mitral valve stenosis. Tricuspid Valve: The tricuspid valve is grossly normal. Tricuspid valve regurgitation is mild . No evidence of tricuspid stenosis. Aortic Valve: The aortic valve is tricuspid. Aortic valve regurgitation is mild to moderate. Aortic valve sclerosis is present, with no evidence of aortic valve stenosis. Pulmonic Valve: The pulmonic valve was normal in structure. Pulmonic valve regurgitation is trivial. No evidence of pulmonic stenosis. Aorta: The aortic root and ascending aorta are structurally normal, with no evidence of dilitation. IAS/Shunts: Cannot exclude a small PFO.  LEFT VENTRICLE PLAX 2D LVIDd:         4.30 cm LVIDs:         3.00 cm LV PW:         1.20 cm LV IVS:        1.10 cm LVOT diam:     1.94 cm LV SV:         48 LV SV Index:   30 LVOT Area:     2.96 cm  LV Volumes (MOD) LV vol d, MOD A2C: 88.5 ml LV vol s, MOD A2C: 47.0 ml LV SV MOD A2C:     41.5 ml RIGHT VENTRICLE            IVC RV Basal diam:  3.95 cm    IVC diam: 1.35 cm RV S prime:     6.98 cm/s TAPSE (M-mode): 1.5 cm LEFT ATRIUM           Index        RIGHT ATRIUM           Index LA diam:      2.86 cm 1.76  cm/m   RA Area:     10.80 cm LA Vol (A4C): 36.4 ml 22.44 ml/m  RA Volume:   19.60 ml  12.08 ml/m  AORTIC VALVE LVOT Vmax:   97.50 cm/s LVOT Vmean:  62.400 cm/s LVOT VTI:    0.164 m  AORTA Ao Root diam: 3.10  cm Ao Asc diam:  3.42 cm TRICUSPID VALVE TR Peak grad:   31.8 mmHg TR Vmax:        282.00 cm/s  SHUNTS Systemic VTI:  0.16 m Systemic Diam: 1.94 cm Sunit Tolia Electronically signed by Madonna Large Signature Date/Time: 12/04/2023/1:28:08 PM    Final     Scheduled Meds:  apixaban   2.5 mg Oral BID   diltiazem   5 mg Intravenous Once   feeding supplement  237 mL Oral BID BM   metoprolol  succinate  50 mg Oral Daily   pantoprazole   40 mg Oral Q1200   sodium zirconium cyclosilicate  10 g Oral Once   Continuous Infusions:  azithromycin  500 mg (12/05/23 2342)   cefTRIAXone  (ROCEPHIN )  IV 1 g (12/05/23 2300)   heparin  850 Units/hr (12/05/23 0933)   sodium chloride  Stopped (12/06/23 0222)     LOS: 2 days   Fredia Skeeter, MD [facility]  12/06/2023, 9:09 AM   *Please note that this is a verbal dictation therefore any spelling or grammatical errors are due to the Dragon Medical One system interpretation.  Please page via Amion and do not message via secure chat for urgent patient care matters. Secure chat can be used for non urgent patient care matters.  How to contact the [facility] Attending or Consulting provider 7A - 7P or covering provider during after hours 7P -7A, for this patient?  Check the care team in [facility] and look for a) attending/consulting [facility] provider listed and b) the [facility] team listed. Page or secure chat 7A-7P. Log into [email or web address] and use [place]'s universal password to access. If you do not have the password, please contact the hospital operator. Locate the [facility] provider you are looking for under [facility] and page to a number that you can be directly reached. If you still have difficulty reaching the provider, please page the [facility] (Director on Call) for the  Hospitalists listed on amion for assistance.

## 2023-12-07 ENCOUNTER — Other Ambulatory Visit (HOSPITAL_COMMUNITY): Payer: Self-pay

## 2023-12-07 DIAGNOSIS — G9341 Metabolic encephalopathy: Secondary | ICD-10-CM | POA: Diagnosis not present

## 2023-12-07 LAB — CBC
HCT: 40.2 % (ref 39.0–52.0)
Hemoglobin: 12.9 g/dL — ABNORMAL LOW (ref 13.0–17.0)
MCH: 28.8 pg (ref 26.0–34.0)
MCHC: 32.1 g/dL (ref 30.0–36.0)
MCV: 89.7 fL (ref 80.0–100.0)
Platelets: 211 K/uL (ref 150–400)
RBC: 4.48 MIL/uL (ref 4.22–5.81)
RDW: 16.3 % — ABNORMAL HIGH (ref 11.5–15.5)
WBC: 13.7 K/uL — ABNORMAL HIGH (ref 4.0–10.5)
nRBC: 0 % (ref 0.0–0.2)

## 2023-12-07 MED ORDER — APIXABAN 2.5 MG PO TABS
2.5000 mg | ORAL_TABLET | Freq: Two times a day (BID) | ORAL | 0 refills | Status: AC
  Filled 2023-12-07: qty 60, 30d supply, fill #0

## 2023-12-07 MED ORDER — FUROSEMIDE 40 MG PO TABS: 60.0000 mg | ORAL_TABLET | Freq: Two times a day (BID) | ORAL | Status: DC

## 2023-12-07 MED ORDER — METOPROLOL SUCCINATE ER 50 MG PO TB24
50.0000 mg | ORAL_TABLET | Freq: Every day | ORAL | 0 refills | Status: AC
  Filled 2023-12-07: qty 30, 30d supply, fill #0

## 2023-12-07 NOTE — Progress Notes (Signed)
 PROGRESS NOTE    Angel Cooper  FMW:[id number] DOB: 21-Sep-1930 DOA: 12/03/2023 PCP: Charlott Dorn LABOR, MD   Brief Narrative:  Angel Cooper is a 89 y.o. male with medical history significant of with history of CKD stage IIIb, A-fib on Eliquis , HFrEF, CAD status post CABG, hypertension, hyperlipidemia, gout, GERD, PUD, ureteral carcinoma status post right nephroureterectomy in 2014 presented to the ED via EMS for evaluation of altered mental status and generalized weakness.  Patient arrived to the ED on 15 L oxygen via nonrebreather.  Tachycardic on arrival but afebrile and not hypotensive.  Had mild hyperkalemia.  UA not suggestive of infection.  VBG with pH 7.30 and pCO2 68.9.  COVID/influenza/RSV PCR negative.  Ethanol level <15.  Ammonia level normal.  Initial troponin 917.  Chest x-ray showing patchy bilateral airspace disease superimposed on background scarring.  Negative right upper quadrant abdominal ultrasound.  CT abdomen pelvis showing no definite acute findings in the abdomen or pelvis, small right pleural effusion, and moderate hiatal hernia.  CT head showing no acute intracranial abnormality.  Patient was given aspirin, ceftriaxone , azithromycin , 500 mL IV fluids, and started on heparin  drip for concern of NSTEMI after he was seen by cardiology.  He was placed on bipap and admitted under hospital service.  Details below.  Assessment & Plan:   Principal Problem:   Acute metabolic encephalopathy Active Problems:   Coronary artery disease   Chronic kidney disease, stage 3b (HCC)   Acute respiratory failure with hypoxia and hypercapnia (HCC)   Elevated troponin   NSTEMI (non-ST elevated myocardial infarction) (HCC)  Acute metabolic encephalopathy, POA: CT head showing no acute intracranial abnormality.  UDS positive for opiates and he does take hydrocodone  at home but according to his son patient takes opiates only on occasion and his brother might have given the patient one  dose yesterday.  TSH slightly elevated 4.6.   EEG negative for epileptiform activity.  Rest of the blood workup negative. VBG confirmed hypercapnia, likely the source of acute encephalopathy which improved with the improvement in hypercapnia.   Acute hypoxemic hypercapnic respiratory failure and respiratory failure secondary to presumed possible community-acquired pneumonia, POA: Patient arrived to the ED on 15 L nonrebreather.  Initial VBG with pH 7.30 and pCO2 68.9.  He was placed on BiPAP in the ED.  Chest x-ray showing patchy bilateral airspace disease superimposed on background scarring.  No fever or leukocytosis.  COVID/influenza/RSV PCR negative.  Started on ceftriaxone  and azithromycin , full 20 pathogen respiratory viral panel negative.  Doing much better, weaned from BiPAP to 3 L currently.  Continue antibiotics.  Continue incentive spirometry.   History of CAD status post CABG/elevated troponin/NSTEMI Troponin 917> 4552 and EKG without acute ischemic changes.  Initially cardiology evaluated the patient and felt that troponin elevation is likely secondary to demand ischemia rather than ACS however based on significant jump on troponin and echo showing left ventricular global hypokinesis, he qualified for NSTEMI however due to patient's overall comorbidities and cachectic state, they recommended continuing medical management with heparin  for 48 hours and then transitioning back to Eliquis , as patient is deemed to be not a candidate for any cardiac catheterization.  Currently on renally dosed Eliquis .   Mild hyperkalemia Resolved with Lokelma.   Elevated transaminases AST 131, ALT 74 (transaminases elevated on previous labs done in February 2023 as well and no significant change), alk phos and T. bili normal.  Negative right upper quadrant abdominal ultrasound.  Likely due to acute stress.  LFTs improving.   Chronic combined systolic and diastolic dysfunction: Last echo done in February 2023  showing EF 35 to 40%, grade 1 diastolic dysfunction, mild mitral regurgitation, and mild to moderate aortic regurgitation.  CT showing small right pleural effusion.  Repeat echo shows improved ejection fraction 45 to 50%.  I resumed Lasix  yesterday however cardiology discontinued that, not sure why, no documentation in the chart.  Will resume Lasix  today.  Reassess tomorrow.   CKD stage IIIb Baseline creatinine appears to be around 1.6.  Currently improved and down to 1.3.   Paroxysmal A-fib Was on diltiazem  PTA, transition to Toprol -XL due to systolic congestive heart failure, on renally dosed Eliquis , rates controlled.  Events noted from last night, he went into A-fib RVR, given a dose of diltiazem  IV, converted to sinus rhythm afterwards.   Hypertension: Blood pressure controlled, continue Toprol -XL.  Gout: Does not take allopurinol  at home.  GERD/PUD: PPI.  Moderate to severe protein calorie malnutrition: Have ordered protein shakes supplements and consulted dietitian.  GOC: Palliative care discussed with the Elder son Brandun yesterday.  He was leaning towards hospice.  When I spoke to him at the bedside today.  He was still waiting for his other brother to make a decision however later on both brothers together made a decision to move on with home with hospice.  Plan is to discharge once hospital bed and rest of the DME's are arranged, and I hoped that it will be achieved today.  As of now, per my discussions with hospice team as well as TOC, not sure if everything will be achieved today.  If it will be, we plan to discharge him later today.  If not, then discharge tomorrow.  DVT prophylaxis: apixaban  (ELIQUIS ) tablet 2.5 mg Start: 12/06/23 1000   Code Status: Limited: Do not attempt resuscitation (DNR) -DNR-LIMITED -Do Not Intubate/DNI   Family Communication: Son at the bedside.  Status is: Inpatient Remains inpatient appropriate because: Patient awaiting DME delivery to home prior to  discharging back home with hospice.  Estimated body mass index is 14.28 kg/m as calculated from the following:   Height as of this encounter: 5' 7 (1.702 m).   Weight as of this encounter: 41.4 kg.    Nutritional Assessment: Body mass index is 14.28 kg/m.[name] Seen by dietician.  I agree with the assessment and plan as outlined below: Nutrition Status:    Skin Assessment: I have examined the patient's skin and I agree with the wound assessment as performed by the wound care RN as outlined below:    Consultants:  Cardiology  Procedures:  As above  Antimicrobials:  Anti-infectives (From admission, onward)    Start     Dose/Rate Route Frequency Ordered Stop   12/05/23 0000  azithromycin  (ZITHROMAX ) 500 mg in sodium chloride  0.9 % 250 mL IVPB        500 mg 250 mL/hr over 60 Minutes Intravenous Every 24 hours 12/04/23 0543     12/04/23 2200  cefTRIAXone  (ROCEPHIN ) 1 g in sodium chloride  0.9 % 100 mL IVPB        1 g 200 mL/hr over 30 Minutes Intravenous Every 24 hours 12/04/23 0543     12/03/23 2245  cefTRIAXone  (ROCEPHIN ) 2 g in sodium chloride  0.9 % 100 mL IVPB        2 g 200 mL/hr over 30 Minutes Intravenous  Once 12/03/23 2232 12/03/23 2329   12/03/23 2245  azithromycin  (ZITHROMAX ) 500 mg in sodium chloride  0.9 % 250  mL IVPB        500 mg 250 mL/hr over 60 Minutes Intravenous  Once 12/03/23 2232 12/04/23 0040         Subjective: Patient seen and examined, he was sitting in the recliner.  No family members were present at bedside.  Patient was much more alert and oriented to self and place.  No complaints.  Objective: Vitals:   12/06/23 1234 12/06/23 1821 12/06/23 1900 12/07/23 0908  BP: (!) 124/59 120/63  (!) 164/77  Pulse: 72 73 68 74  Resp: 18 19  18   Temp: 99.5 F (37.5 C) 99 F (37.2 C)  98.6 F (37 C)  TempSrc: Axillary Axillary  Axillary  SpO2: 100% 99% 99% 100%  Weight:      Height:        Intake/Output Summary (Last 24 hours) at 12/07/2023  1048 Last data filed at 12/07/2023 0900 Gross per 24 hour  Intake 855.32 ml  Output 1300 ml  Net -444.68 ml    Filed Weights   12/03/23 2119 12/04/23 1414 12/05/23 0831  Weight: 54 kg 41.6 kg 41.4 kg    Examination:  General exam: Appears calm and comfortable  Respiratory system: Clear to auscultation. Respiratory effort normal. Cardiovascular system: S1 & S2 heard, RRR. No JVD, murmurs, rubs, gallops or clicks. No pedal edema. Gastrointestinal system: Abdomen is nondistended, soft and nontender. No organomegaly or masses felt. Normal bowel sounds heard. Central nervous system: Alert and oriented x 2. No focal neurological deficits. Extremities: Symmetric 5 x 5 power. Skin: No rashes, lesions or ulcers.   Data Reviewed: I have personally reviewed following labs and imaging studies  CBC: Recent Labs  Lab 12/03/23 2156 12/03/23 2159 12/05/23 0258 12/06/23 0236  WBC 10.2  --  11.5* 11.7*  NEUTROABS 8.4*  --   --   --   HGB 16.2 16.7  16.3 12.5* 12.2*  HCT 53.8* 49.0  48.0 40.7 39.4  MCV 94.9  --  93.3 93.4  PLT 262  --  218 205   Basic Metabolic Panel: Recent Labs  Lab 12/03/23 2156 12/03/23 2159 12/04/23 0715 12/04/23 1653 12/05/23 0842  NA 141 138  138 141  --  143  K 5.9* 5.5*  5.6* 5.8* 5.1 5.0  CL 98 100 100  --  102  CO2 29  --  29  --  29  GLUCOSE 113* 114* 81  --  78  BUN 44* 52* 47*  --  44*  CREATININE 1.51* 1.60* 1.80*  --  1.31*  CALCIUM  9.0  --  8.5*  --  8.4*   GFR: Estimated Creatinine Clearance (by C-G formula based on SCr of 1.31 mg/dL (H)) Male: 81.6 mL/min (A) Male: 21.5 mL/min (A) Liver Function Tests: Recent Labs  Lab 12/03/23 2156 12/04/23 0715 12/05/23 0842  AST 131* 106* 51*  ALT 74* 57* 39  ALKPHOS 62 54 51  BILITOT 0.8 0.9 0.4  PROT 7.4 6.2* 5.6*  ALBUMIN  4.2 3.5 3.1*   No results for input(s): LIPASE, AMYLASE in the last 168 hours. Recent Labs  Lab 12/04/23 0012 12/04/23 0713  AMMONIA 29 32   Coagulation  Profile: No results for input(s): INR, PROTIME in the last 168 hours. Cardiac Enzymes: No results for input(s): CKTOTAL, CKMB, CKMBINDEX, TROPONINI in the last 168 hours. BNP (last 3 results) No results for input(s): PROBNP in the last 8760 hours. HbA1C: No results for input(s): HGBA1C in the last 72 hours. CBG: Recent Labs  Lab 12/06/23  2037  GLUCAP 102*   Lipid Profile: No results for input(s): CHOL, HDL, LDLCALC, TRIG, CHOLHDL, LDLDIRECT in the last 72 hours. Thyroid  Function Tests: No results for input(s): TSH, T4TOTAL, FREET4, T3FREE, THYROIDAB in the last 72 hours.  Anemia Panel: Recent Labs    12/04/23 1511  VITAMINB12 759   Sepsis Labs: Recent Labs  Lab 12/04/23 0715  PROCALCITON <0.10    Recent Results (from the past 240 hours)  Blood culture (routine x 2)     Status: None (Preliminary result)   Collection Time: 12/03/23  9:41 PM   Specimen: BLOOD  Result Value Ref Range Status   Specimen Description BLOOD SITE NOT SPECIFIED  Final   Special Requests   Final    BOTTLES DRAWN AEROBIC AND ANAEROBIC Blood Culture results may not be optimal due to an inadequate volume of blood received in culture bottles   Culture   Final    NO GROWTH 4 DAYS Performed at [facility], [address], [place], [place] [address]    Report Status PENDING  Incomplete  Blood culture (routine x 2)     Status: None (Preliminary result)   Collection Time: 12/03/23  9:51 PM   Specimen: BLOOD  Result Value Ref Range Status   Specimen Description BLOOD SITE NOT SPECIFIED  Final   Special Requests   Final    BOTTLES DRAWN AEROBIC AND ANAEROBIC Blood Culture results may not be optimal due to an inadequate volume of blood received in culture bottles   Culture   Final    NO GROWTH 4 DAYS Performed at [facility], [address] [place], [place] [address]    Report Status PENDING  Incomplete  Resp panel by RT-PCR (RSV, Flu A&B, Covid)  Anterior Nasal Swab     Status: None   Collection Time: 12/03/23 11:19 PM   Specimen: Anterior Nasal Swab  Result Value Ref Range Status   SARS Coronavirus 2 by RT PCR NEGATIVE NEGATIVE Final   Influenza A by PCR NEGATIVE NEGATIVE Final   Influenza B by PCR NEGATIVE NEGATIVE Final    Comment: (NOTE) The Xpert Xpress SARS-CoV-2/FLU/RSV plus assay is intended as an aid in the diagnosis of influenza from Nasopharyngeal swab specimens and should not be used as a sole basis for treatment. Nasal washings and aspirates are unacceptable for Xpert Xpress SARS-CoV-2/FLU/RSV testing.  Fact Sheet for Patients: bloggercourse.com  Fact Sheet for Healthcare Providers: seriousbroker.it  This test is not yet approved or cleared by the [place]  FDA and has been authorized for detection and/or diagnosis of SARS-CoV-2 by FDA under an Emergency Use Authorization (EUA). This EUA will remain in effect (meaning this test can be used) for the duration of the COVID-19 declaration under Section 564(b)(1) of the Act, 21 U.S.C. section 360bbb-3(b)(1), unless the authorization is terminated or revoked.     Resp Syncytial Virus by PCR NEGATIVE NEGATIVE Final    Comment: (NOTE) Fact Sheet for Patients: bloggercourse.com  Fact Sheet for Healthcare Providers: seriousbroker.it  This test is not yet approved or cleared by the [place]  FDA and has been authorized for detection and/or diagnosis of SARS-CoV-2 by FDA under an Emergency Use Authorization (EUA). This EUA will remain in effect (meaning this test can be used) for the duration of the COVID-19 declaration under Section 564(b)(1) of the Act, 21 U.S.C. section 360bbb-3(b)(1), unless the authorization is terminated or revoked.  Performed at [facility], [address], [place], [place] [address]  Urine Culture (for pregnant, neutropenic  or urologic patients or patients with an indwelling urinary catheter)     Status: Abnormal   Collection Time: 12/04/23  5:44 AM   Specimen: Urine, Clean Catch  Result Value Ref Range Status   Specimen Description URINE, CLEAN CATCH  Final   Special Requests NONE  Final   Culture (A)  Final    <10,000 COLONIES/mL INSIGNIFICANT GROWTH Performed at [facility], [address], [place], [place] [address]    Report Status 12/05/2023 FINAL  Final  Respiratory (~20 pathogens) panel by PCR     Status: None   Collection Time: 12/04/23  5:47 AM   Specimen: Nasopharyngeal Swab; Respiratory  Result Value Ref Range Status   Adenovirus NOT DETECTED NOT DETECTED Final   Coronavirus 229E NOT DETECTED NOT DETECTED Final    Comment: (NOTE) The Coronavirus on the Respiratory Panel, DOES NOT test for the novel  Coronavirus (2019 nCoV)    Coronavirus HKU1 NOT DETECTED NOT DETECTED Final   Coronavirus NL63 NOT DETECTED NOT DETECTED Final   Coronavirus OC43 NOT DETECTED NOT DETECTED Final   Metapneumovirus NOT DETECTED NOT DETECTED Final   Rhinovirus / Enterovirus NOT DETECTED NOT DETECTED Final   Influenza A NOT DETECTED NOT DETECTED Final   Influenza B NOT DETECTED NOT DETECTED Final   Parainfluenza Virus 1 NOT DETECTED NOT DETECTED Final   Parainfluenza Virus 2 NOT DETECTED NOT DETECTED Final   Parainfluenza Virus 3 NOT DETECTED NOT DETECTED Final   Parainfluenza Virus 4 NOT DETECTED NOT DETECTED Final   Respiratory Syncytial Virus NOT DETECTED NOT DETECTED Final   Bordetella pertussis NOT DETECTED NOT DETECTED Final   Bordetella Parapertussis NOT DETECTED NOT DETECTED Final   Chlamydophila pneumoniae NOT DETECTED NOT DETECTED Final   Mycoplasma pneumoniae NOT DETECTED NOT DETECTED Final    Comment: Performed at [facility], [address], [place], [place] [address]     Radiology Studies: No results found.   Scheduled Meds:  apixaban   2.5 mg Oral BID   feeding supplement  237  mL Oral BID BM   metoprolol  succinate  50 mg Oral Daily   pantoprazole   40 mg Oral Q1200   sodium zirconium cyclosilicate  10 g Oral Once   Continuous Infusions:  azithromycin  Stopped (12/07/23 0309)   cefTRIAXone  (ROCEPHIN )  IV Stopped (12/07/23 0010)   sodium chloride  Stopped (12/06/23 0222)     LOS: 3 days   Fredia Skeeter, MD [facility]  12/07/2023, 10:48 AM   *Please note that this is a verbal dictation therefore any spelling or grammatical errors are due to the Dragon Medical One system interpretation.  Please page via Amion and do not message via secure chat for urgent patient care matters. Secure chat can be used for non urgent patient care matters.  How to contact the [facility] Attending or Consulting provider 7A - 7P or covering provider during after hours 7P -7A, for this patient?  Check the care team in [facility] and look for a) attending/consulting [facility] provider listed and b) the [facility] team listed. Page or secure chat 7A-7P. Log into [email or web address] and use [facility] universal password to access. If you do not have the password, please contact the hospital operator. Locate the [facility] provider you are looking for under [facility] and page to a number that you can be directly reached. If you still have difficulty reaching the provider, please page the [facility] (Director on Call) for the Hospitalists listed on  amion for assistance.

## 2023-12-07 NOTE — Discharge Summary (Signed)
 Physician Discharge Summary  Angel Cooper FMW:[id number] DOB: 11-11-1930 DOA: 12/03/2023  PCP: Charlott Dorn LABOR, MD  Admit date: 12/03/2023 Discharge date: 12/07/2023 30 Day Unplanned Readmission Risk Score    Flowsheet Row ED to Hosp-Admission (Current) from 12/03/2023 in [facility] 3E HF PCU  30 Day Unplanned Readmission Risk Score (%) 21.94 Filed at 12/07/2023 1600    This score is the patient's risk of an unplanned readmission within 30 days of being discharged (0 -100%). The score is based on dignosis, age, lab data, medications, orders, and past utilization.   Low:  0-14.9   Medium: 15-21.9   High: 22-29.9   Extreme: 30 and above          Admitted From: Home Disposition: Home with hospice  Recommendations for Outpatient Follow-up:  Follow up with PCP in 1-2 weeks Please obtain BMP/CBC in one week Please follow up with your PCP on the following pending results: Unresulted Labs (From admission, onward)     Start     Ordered   12/05/23 0500  CBC  Daily,   R     Placed in And Linked Group   12/04/23 0035              Home Health: None Equipment/Devices: Hospital bed and other EMS  Discharge Condition: Currently stable but at times guarded with poor prognosis CODE STATUS: DNR Diet recommendation:  Diet Order             Diet Heart Room service appropriate? Yes; Fluid consistency: Thin  Diet effective now                   Subjective: Seen and examined earlier today, patient was sitting in the chair, alert and oriented to self, had no complaints.  Appeared comfortable.  Brief/Interim Summary: Angel Cooper is a 89 y.o. male with medical history significant of with history of CKD stage IIIb, A-fib on Eliquis , HFrEF, CAD status post CABG, hypertension, hyperlipidemia, gout, GERD, PUD, ureteral carcinoma status post right nephroureterectomy in 2014 presented to the ED via EMS for evaluation of altered mental status and generalized  weakness.  Patient arrived to the ED on 15 L oxygen via nonrebreather.  Tachycardic on arrival but afebrile and not hypotensive.  Had mild hyperkalemia.  UA not suggestive of infection.  VBG with pH 7.30 and pCO2 68.9.  COVID/influenza/RSV PCR negative.  Ethanol level <15.  Ammonia level normal.  Initial troponin 917.  Chest x-ray showing patchy bilateral airspace disease superimposed on background scarring.  Negative right upper quadrant abdominal ultrasound.  CT abdomen pelvis showing no definite acute findings in the abdomen or pelvis, small right pleural effusion, and moderate hiatal hernia.  CT head showing no acute intracranial abnormality.  Patient was given aspirin, ceftriaxone , azithromycin , 500 mL IV fluids, and started on heparin  drip for concern of NSTEMI after he was seen by cardiology.  He was placed on bipap and admitted under hospital service.  Details below.   Acute metabolic encephalopathy, POA: CT head showing no acute intracranial abnormality.  UDS positive for opiates and he does take hydrocodone  at home but according to his son patient takes opiates only on occasion and his brother might have given the patient one dose yesterday.  TSH slightly elevated 4.6.   EEG negative for epileptiform activity.  Rest of the blood workup negative. VBG confirmed hypercapnia, likely the source of acute encephalopathy which improved with the improvement in hypercapnia.   Acute hypoxemic hypercapnic respiratory  failure and respiratory failure secondary to presumed possible community-acquired pneumonia, POA: Patient arrived to the ED on 15 L nonrebreather.  Initial VBG with pH 7.30 and pCO2 68.9.  He was placed on BiPAP in the ED.  Chest x-ray showing patchy bilateral airspace disease superimposed on background scarring.  No fever or leukocytosis.  COVID/influenza/RSV PCR negative.  Started on ceftriaxone  and azithromycin , full 20 pathogen respiratory viral panel negative.  Doing much better, weaned from  BiPAP to 3 L currently.  We continued antibiotics per family's request but no antibiotics will be given while he will be signed up with hospice at home.  Family aware.   History of CAD status post CABG/elevated troponin/NSTEMI Troponin 917> 4552 and EKG without acute ischemic changes.  Initially cardiology evaluated the patient and felt that troponin elevation is likely secondary to demand ischemia rather than ACS however based on significant jump on troponin and echo showing left ventricular global hypokinesis, he qualified for NSTEMI however due to patient's overall comorbidities and cachectic state, they recommended continuing medical management with heparin  for 48 hours and then transitioning back to Eliquis , as patient is deemed to be not a candidate for any cardiac catheterization and the fact that patient developed brief episode of atrial fibrillation with RVR terminated with diltiazem  so cardiology recommended anticoagulation.  Currently on renally dosed Eliquis .   Mild hyperkalemia Resolved with Lokelma.   Elevated transaminases AST 131, ALT 74 (transaminases elevated on previous labs done in February 2023 as well and no significant change), alk phos and T. bili normal.  Negative right upper quadrant abdominal ultrasound.  Likely due to acute stress.  LFTs improving.   Chronic combined systolic and diastolic dysfunction: Last echo done in February 2023 showing EF 35 to 40%, grade 1 diastolic dysfunction, mild mitral regurgitation, and mild to moderate aortic regurgitation.  CT showing small right pleural effusion.  Repeat echo shows improved ejection fraction 45 to 50%.  I resumed Lasix  yesterday however cardiology discontinued that, not sure why, no documentation in the chart.  Will resume Lasix  today.    CKD stage IIIb Baseline creatinine appears to be around 1.6.  Currently improved and down to 1.3.   Paroxysmal A-fib Was on diltiazem  PTA, transitioned to Toprol -XL due to systolic  congestive heart failure, on renally dosed Eliquis , rates controlled.    Hypertension: Blood pressure controlled, continue Toprol -XL.   Gout: Does not take allopurinol  at home.   GERD/PUD: PPI.   Moderate to severe protein calorie malnutrition: Have ordered protein shakes supplements and consulted dietitian.   GOC: Palliative care and I spoke to the family.  Family opted for home with hospice yesterday.  Reportedly, all DME's have been delivered today and patient is being discharged home with hospice.  Discharge plan was discussed with patient and/or family member and they verbalized understanding and agreed with it.  Discharge Diagnoses:  Principal Problem:   Acute metabolic encephalopathy Active Problems:   Coronary artery disease   Chronic kidney disease, stage 3b (HCC)   Acute respiratory failure with hypoxia and hypercapnia (HCC)   Elevated troponin   NSTEMI (non-ST elevated myocardial infarction) [facility])    Discharge Instructions   Allergies as of 12/07/2023       Reactions   Aspirin Other (See Comments)   Stomach pain    Chlorhexidine Other (See Comments)   CAUSED SKIN IRRITATION AND BURNING SENSATION   Enalapril Other (See Comments)   Cannot take due to kidney compromise   Gabapentin Other (See Comments)  Caused nightmares   Statins Other (See Comments)   Myalgia        Medication List     STOP taking these medications    allopurinol  100 MG tablet Commonly known as: ZYLOPRIM    diltiazem  60 MG 12 hr capsule Commonly known as: CARDIZEM  SR       TAKE these medications    acetaminophen  500 MG tablet Commonly known as: TYLENOL  Take 1 tablet (500 mg total) by mouth every 4 (four) hours as needed for mild pain or headache.   albuterol  108 (90 Base) MCG/ACT inhaler Commonly known as: VENTOLIN  HFA Inhale 1 puff into the lungs every 4 (four) hours as needed for shortness of breath or wheezing.   ALPRAZolam  0.25 MG tablet Commonly known as:  XANAX  Take 1 tablet (0.25 mg total) by mouth at bedtime as needed for sleep.   apixaban  2.5 MG Tabs tablet Commonly known as: ELIQUIS  Take 1 tablet (2.5 mg total) by mouth 2 (two) times daily.   B-12 Compliance Injection 1000 MCG/ML Kit Generic drug: Cyanocobalamin  Inject 1,000 mcg as directed every 30 (thirty) days.   feeding supplement Liqd Take 237 mLs by mouth 3 (three) times daily between meals.   furosemide  20 MG tablet Commonly known as: LASIX  TAKE THREE TABLETS BY MOUTH TWICE DAILY   HYDROcodone -acetaminophen  5-325 MG tablet Commonly known as: NORCO/VICODIN Take 1 tablet by mouth every 6 (six) hours as needed for severe pain. What changed:  when to take this reasons to take this   metoprolol  succinate 50 MG 24 hr tablet Commonly known as: TOPROL -XL Take 1 tablet (50 mg total) by mouth daily. Take with or immediately following a meal. Start taking on: December 08, 2023   multivitamin Tabs tablet Take 1 tablet by mouth at bedtime.   omeprazole 20 MG capsule Commonly known as: PRILOSEC Take 20 mg by mouth daily.   pantoprazole  40 MG tablet Commonly known as: PROTONIX  Take 1 tablet (40 mg total) by mouth daily at 12 noon.   polyethylene glycol 17 g packet Commonly known as: MIRALAX  / GLYCOLAX  Take 17 g by mouth daily as needed.   sennosides-docusate sodium  8.6-50 MG tablet Commonly known as: SENOKOT-S Take 2 tablets by mouth daily.        Follow-up Information     Charlott Dorn LABOR, MD Follow up.   Specialty: Internal Medicine Why: Please follow up in a week. Contact information: [address] [place] [address] [phone]         Charlott Dorn LABOR, MD .   Specialty: Internal Medicine Contact information: [address] [place] [address] [phone]                Allergies  Allergen Reactions   Aspirin Other (See Comments)    Stomach pain    Chlorhexidine Other (See Comments)     CAUSED SKIN IRRITATION AND BURNING SENSATION   Enalapril Other (See Comments)    Cannot take due to kidney compromise   Gabapentin Other (See Comments)    Caused nightmares   Statins Other (See Comments)    Myalgia    Consultations: Cardiology   Procedures/Studies: ECHOCARDIOGRAM COMPLETE Result Date: 12/04/2023    ECHOCARDIOGRAM REPORT   Patient Name:   JANARI YAMADA Date of Exam: 12/04/2023 Medical Rec #:  [id number]         Height:       67.0 in Accession #:    [id number]  Weight:       119.0 lb Date of Birth:  12/23/1930          BSA:          1.622 m Patient Age:    89 years          BP:           120/60 mmHg Patient Gender: M                 HR:           89 bpm. Exam Location:  Inpatient Procedure: 2D Echo, Cardiac Doppler and Color Doppler (Both Spectral and Color            Flow Doppler were utilized during procedure). Indications:    Elevated Troponin  History:        Patient has prior history of Echocardiogram examinations, most                 recent 03/03/2021. CAD, Prior CABG; Risk Factors:Hypertension.  Sonographer:    Koleen Popper RDCS Referring Phys: [id number] VASUNDHRA RATHORE IMPRESSIONS  1. Left ventricular ejection fraction, by estimation, is 45 to 50%. The left ventricle has mildly decreased function. The left ventricle demonstrates global hypokinesis. There is mild left ventricular hypertrophy. Left ventricular diastolic function could not be evaluated.  2. Right ventricular systolic function mild to moderately reduced. The right ventricular size is normal. Mildly increased right ventricular wall thickness. There is normal pulmonary artery systolic pressure.  3. The mitral valve is degenerative. No evidence of mitral valve regurgitation. No evidence of mitral stenosis.  4. The aortic valve is tricuspid. Aortic valve regurgitation is mild to moderate. Aortic valve sclerosis is present, with no evidence of aortic valve stenosis.  5. Cannot exclude a small PFO. Comparison(s):  A prior study was performed on 03/03/2021. LVEF 35-40%, global hypokinesis, akinetic anteroseptal/inferoseptal wall from base to apex consistent with prior CABG, grade 1 diastolic dysfunction, severe LAE, moderate RAE. FINDINGS  Left Ventricle: Left ventricular ejection fraction, by estimation, is 45 to 50%. The left ventricle has mildly decreased function. The left ventricle demonstrates global hypokinesis. The left ventricular internal cavity size was normal in size. There is  mild left ventricular hypertrophy. Abnormal (paradoxical) septal motion consistent with post-operative status. Left ventricular diastolic function could not be evaluated due to nondiagnostic images. Left ventricular diastolic function could not be evaluated. Right Ventricle: The right ventricular size is normal. Mildly increased right ventricular wall thickness. Right ventricular systolic function mild to moderately reduced. There is normal pulmonary artery systolic pressure. The tricuspid regurgitant velocity is 2.82 m/s, and with an assumed right atrial pressure of 3 mmHg, the estimated right ventricular systolic pressure is 34.8 mmHg. Left Atrium: Left atrial size was normal in size. Right Atrium: Right atrial size was normal in size. Pericardium: There is no evidence of pericardial effusion. Mitral Valve: The mitral valve is degenerative in appearance. There is mild thickening of the mitral valve leaflet(s). There is mild calcification of the mitral valve leaflet(s). Mild mitral annular calcification. No evidence of mitral valve regurgitation. No evidence of mitral valve stenosis. Tricuspid Valve: The tricuspid valve is grossly normal. Tricuspid valve regurgitation is mild . No evidence of tricuspid stenosis. Aortic Valve: The aortic valve is tricuspid. Aortic valve regurgitation is mild to moderate. Aortic valve sclerosis is present, with no evidence of aortic valve stenosis. Pulmonic Valve: The pulmonic valve was normal in structure.  Pulmonic valve regurgitation is trivial. No evidence of pulmonic  stenosis. Aorta: The aortic root and ascending aorta are structurally normal, with no evidence of dilitation. IAS/Shunts: Cannot exclude a small PFO.  LEFT VENTRICLE PLAX 2D LVIDd:         4.30 cm LVIDs:         3.00 cm LV PW:         1.20 cm LV IVS:        1.10 cm LVOT diam:     1.94 cm LV SV:         48 LV SV Index:   30 LVOT Area:     2.96 cm  LV Volumes (MOD) LV vol d, MOD A2C: 88.5 ml LV vol s, MOD A2C: 47.0 ml LV SV MOD A2C:     41.5 ml RIGHT VENTRICLE            IVC RV Basal diam:  3.95 cm    IVC diam: 1.35 cm RV S prime:     6.98 cm/s TAPSE (M-mode): 1.5 cm LEFT ATRIUM           Index        RIGHT ATRIUM           Index LA diam:      2.86 cm 1.76 cm/m   RA Area:     10.80 cm LA Vol (A4C): 36.4 ml 22.44 ml/m  RA Volume:   19.60 ml  12.08 ml/m  AORTIC VALVE LVOT Vmax:   97.50 cm/s LVOT Vmean:  62.400 cm/s LVOT VTI:    0.164 m  AORTA Ao Root diam: 3.10 cm Ao Asc diam:  3.42 cm TRICUSPID VALVE TR Peak grad:   31.8 mmHg TR Vmax:        282.00 cm/s  SHUNTS Systemic VTI:  0.16 m Systemic Diam: 1.94 cm Sunit Tolia Electronically signed by Madonna Large Signature Date/Time: 12/04/2023/1:28:08 PM    Final    EEG adult Result Date: 12/04/2023 Shelton Arlin KIDD, MD     12/04/2023  8:07 AM Patient Name: PARK BECK MRN: [id number] Epilepsy Attending: Arlin KIDD Shelton Referring Physician/Provider: Alfornia Madison, MD Date: 12/04/2023 Duration: 25.34 mins Patient history: 89yo M with ams. EEG to evaluate for seizure Level of alertness: Awake AEDs during EEG study: None Technical aspects: This EEG study was done with scalp electrodes positioned according to the 10-20 International system of electrode placement. Electrical activity was reviewed with band pass filter of 1-70Hz , sensitivity of 7 uV/mm, display speed of 45mm/sec with a 60Hz  notched filter applied as appropriate. EEG data were recorded continuously and digitally stored.  Video monitoring  was available and reviewed as appropriate. Description: EEG showed continuous generalized 3 to 6 Hz theta-delta slowing. Hyperventilation and photic stimulation were not performed.   ABNORMALITY - Continuous slow, generalized IMPRESSION: This study is suggestive of generalized cerebral dysfunction  (encephalopathy). No seizures or epileptiform discharges were seen throughout the recording. Priyanka KIDD Shelton   CT ABDOMEN PELVIS WO CONTRAST Result Date: 12/04/2023 EXAM: CT ABDOMEN AND PELVIS WITHOUT CONTRAST 12/04/2023 12:18:19 AM TECHNIQUE: CT of the abdomen and pelvis was performed without the administration of intravenous contrast. Multiplanar reformatted images are provided for review. Automated exposure control, iterative reconstruction, and/or weight-based adjustment of the mA/kV was utilized to reduce the radiation dose to as low as reasonably achievable. COMPARISON: CT 03/01/2021. CLINICAL HISTORY: elevated LFTs fatigue FINDINGS: LIMITATIONS/ARTIFACTS: Lack of IV or oral contrast and paucity of intraabdominal fat limits assessment. LOWER CHEST: Chronic interstitial lung disease in the lower lungs. Small right  pleural effusion. LIVER: The liver is unremarkable. GALLBLADDER AND BILE DUCTS: Gallbladder is unremarkable. No biliary ductal dilatation. SPLEEN: No acute abnormality. PANCREAS: No acute abnormality. ADRENAL GLANDS: No acute abnormality. KIDNEYS, URETERS AND BLADDER: No stones in the kidneys or ureters. No hydronephrosis. No perinephric or periureteral stranding. Urinary bladder is unremarkable. GI AND BOWEL: Moderate hiatal hernia. Colonic diverticulosis without diverticulitis. There is no bowel obstruction. PERITONEUM AND RETROPERITONEUM: No ascites. No free air. VASCULATURE: Aorta is normal in caliber. Aortic atherosclerotic calcification. LYMPH NODES: No lymphadenopathy. REPRODUCTIVE ORGANS: No acute abnormality. 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 Chronic bilateral L5  pars defects with grade 1 spondylolisthesis of L5. No focal soft tissue abnormality. IMPRESSION: 1. No definite acute findings in the abdomen or pelvis. 2. Small right pleural effusion. 3. Moderate hiatal hernia. Electronically signed by: Norman Gatlin MD 12/04/2023 12:29 AM EST RP Workstation: [id number]   CT HEAD WO CONTRAST Result Date: 12/04/2023 EXAM: CT HEAD WITHOUT CONTRAST 12/04/2023 12:18:19 AM TECHNIQUE: CT of the head was performed without the administration of intravenous contrast. Automated exposure control, iterative reconstruction, and/or weight based adjustment of the mA/kV was utilized to reduce the radiation dose to as low as reasonably achievable. COMPARISON: MRI head 2 / 9 / 21. CLINICAL HISTORY: ams FINDINGS: BRAIN AND VENTRICLES: No acute hemorrhage. No evidence of acute infarct. No hydrocephalus. No extra-axial collection. No mass effect or midline shift. Chronic severe white matter changes and generalized volume loss. Scattered calcified atherosclerosis. ORBITS: Lens extraction. SINUSES: No acute abnormality. SOFT TISSUES AND SKULL: No acute soft tissue abnormality. No skull fracture. IMPRESSION: 1. No acute intracranial abnormality. Electronically signed by: Norman Gatlin MD 12/04/2023 12:23 AM EST RP Workstation: [id number]   US  Abdomen Limited RUQ (LIVER/GB) Result Date: 12/04/2023 CLINICAL DATA:  Elevated LFT EXAM: ULTRASOUND ABDOMEN LIMITED RIGHT UPPER QUADRANT COMPARISON:  None Available. FINDINGS: Gallbladder: No gallstones or wall thickening visualized. No sonographic Murphy sign noted by sonographer. Common bile duct: Diameter: 2.9 mm Liver: No focal lesion identified. Within normal limits in parenchymal echogenicity. Portal vein is patent on color Doppler imaging with normal direction of blood flow towards the liver. Other: Possible fluid-filled bowel in the left upper quadrant. IMPRESSION: 1. Negative right upper quadrant ultrasound Electronically Signed   By: Luke Bun  M.D.   On: 12/04/2023 00:09   DG Chest Port 1 View Result Date: 12/03/2023 CLINICAL DATA:  Altered level of consciousness EXAM: PORTABLE CHEST 1 VIEW COMPARISON:  09/03/2023 FINDINGS: Single frontal view of the chest demonstrates stable postsurgical changes from CABG. The cardiac silhouette is unremarkable. There is chronic background interstitial prominence, with patchy areas of airspace disease throughout the right lung as well as at the left lung base. No effusion or pneumothorax. No acute bony abnormalities. Colonic interposition right upper quadrant abdomen. IMPRESSION: 1. Patchy bilateral airspace disease superimposed upon background scarring. Electronically Signed   By: Ozell Daring M.D.   On: 12/03/2023 22:18     Discharge Exam: Vitals:   12/07/23 0908 12/07/23 1114  BP: (!) 164/77 (!) 146/78  Pulse: 74 79  Resp: 18 18  Temp: 98.6 F (37 C) 97.6 F (36.4 C)  SpO2: 100% 98%   Vitals:   12/06/23 1821 12/06/23 1900 12/07/23 0908 12/07/23 1114  BP: 120/63  (!) 164/77 (!) 146/78  Pulse: 73 68 74 79  Resp: 19  18 18   Temp: 99 F (37.2 C)  98.6 F (37 C) 97.6 F (36.4 C)  TempSrc: Axillary  Axillary Oral  SpO2: 99% 99% 100% 98%  Weight:      Height:        General: Pt is alert, awake, not in acute distress Cardiovascular: RRR, S1/S2 +, no rubs, no gallops Respiratory: CTA bilaterally, no wheezing, no rhonchi Abdominal: Soft, NT, ND, bowel sounds + Extremities: no edema, no cyanosis    The results of significant diagnostics from this hospitalization (including imaging, microbiology, ancillary and laboratory) are listed below for reference.     Microbiology: Recent Results (from the past 240 hours)  Blood culture (routine x 2)     Status: None (Preliminary result)   Collection Time: 12/03/23  9:41 PM   Specimen: BLOOD  Result Value Ref Range Status   Specimen Description BLOOD SITE NOT SPECIFIED  Final   Special Requests   Final    BOTTLES DRAWN AEROBIC AND  ANAEROBIC Blood Culture results may not be optimal due to an inadequate volume of blood received in culture bottles   Culture   Final    NO GROWTH 4 DAYS Performed at [facility], [address] [place], [place] [address]    Report Status PENDING  Incomplete  Blood culture (routine x 2)     Status: None (Preliminary result)   Collection Time: 12/03/23  9:51 PM   Specimen: BLOOD  Result Value Ref Range Status   Specimen Description BLOOD SITE NOT SPECIFIED  Final   Special Requests   Final    BOTTLES DRAWN AEROBIC AND ANAEROBIC Blood Culture results may not be optimal due to an inadequate volume of blood received in culture bottles   Culture   Final    NO GROWTH 4 DAYS Performed at [facility], [address], [place], [place] [address]    Report Status PENDING  Incomplete  Resp panel by RT-PCR (RSV, Flu A&B, Covid) Anterior Nasal Swab     Status: None   Collection Time: 12/03/23 11:19 PM   Specimen: Anterior Nasal Swab  Result Value Ref Range Status   SARS Coronavirus 2 by RT PCR NEGATIVE NEGATIVE Final   Influenza A by PCR NEGATIVE NEGATIVE Final   Influenza B by PCR NEGATIVE NEGATIVE Final    Comment: (NOTE) The Xpert Xpress SARS-CoV-2/FLU/RSV plus assay is intended as an aid in the diagnosis of influenza from Nasopharyngeal swab specimens and should not be used as a sole basis for treatment. Nasal washings and aspirates are unacceptable for Xpert Xpress SARS-CoV-2/FLU/RSV testing.  Fact Sheet for Patients: bloggercourse.com  Fact Sheet for Healthcare Providers: seriousbroker.it  This test is not yet approved or cleared by the [place]  FDA and has been authorized for detection and/or diagnosis of SARS-CoV-2 by FDA under an Emergency Use Authorization (EUA). This EUA will remain in effect (meaning this test can be used) for the duration of the COVID-19 declaration under Section 564(b)(1) of the Act, 21  U.S.C. section 360bbb-3(b)(1), unless the authorization is terminated or revoked.     Resp Syncytial Virus by PCR NEGATIVE NEGATIVE Final    Comment: (NOTE) Fact Sheet for Patients: bloggercourse.com  Fact Sheet for Healthcare Providers: seriousbroker.it  This test is not yet approved or cleared by the [place]  FDA and has been authorized for detection and/or diagnosis of SARS-CoV-2 by FDA under an Emergency Use Authorization (EUA). This EUA will remain in effect (meaning this test can be used) for the duration of the COVID-19 declaration under Section 564(b)(1) of the Act, 21 U.S.C. section 360bbb-3(b)(1), unless the authorization is terminated or  revoked.  Performed at [facility], [address], [place], [place] [address]   Urine Culture (for pregnant, neutropenic or urologic patients or patients with an indwelling urinary catheter)     Status: Abnormal   Collection Time: 12/04/23  5:44 AM   Specimen: Urine, Clean Catch  Result Value Ref Range Status   Specimen Description URINE, CLEAN CATCH  Final   Special Requests NONE  Final   Culture (A)  Final    <10,000 COLONIES/mL INSIGNIFICANT GROWTH Performed at [facility], [address] [place], [place] [address]    Report Status 12/05/2023 FINAL  Final  Respiratory (~20 pathogens) panel by PCR     Status: None   Collection Time: 12/04/23  5:47 AM   Specimen: Nasopharyngeal Swab; Respiratory  Result Value Ref Range Status   Adenovirus NOT DETECTED NOT DETECTED Final   Coronavirus 229E NOT DETECTED NOT DETECTED Final    Comment: (NOTE) The Coronavirus on the Respiratory Panel, DOES NOT test for the novel  Coronavirus (2019 nCoV)    Coronavirus HKU1 NOT DETECTED NOT DETECTED Final   Coronavirus NL63 NOT DETECTED NOT DETECTED Final   Coronavirus OC43 NOT DETECTED NOT DETECTED Final   Metapneumovirus NOT DETECTED NOT DETECTED Final   Rhinovirus /  Enterovirus NOT DETECTED NOT DETECTED Final   Influenza A NOT DETECTED NOT DETECTED Final   Influenza B NOT DETECTED NOT DETECTED Final   Parainfluenza Virus 1 NOT DETECTED NOT DETECTED Final   Parainfluenza Virus 2 NOT DETECTED NOT DETECTED Final   Parainfluenza Virus 3 NOT DETECTED NOT DETECTED Final   Parainfluenza Virus 4 NOT DETECTED NOT DETECTED Final   Respiratory Syncytial Virus NOT DETECTED NOT DETECTED Final   Bordetella pertussis NOT DETECTED NOT DETECTED Final   Bordetella Parapertussis NOT DETECTED NOT DETECTED Final   Chlamydophila pneumoniae NOT DETECTED NOT DETECTED Final   Mycoplasma pneumoniae NOT DETECTED NOT DETECTED Final    Comment: Performed at [facility], [address], [place], [place] [address]     Labs: BNP (last 3 results) Recent Labs    12/04/23 0510  BNP 1,207.0*   Basic Metabolic Panel: Recent Labs  Lab 12/03/23 2156 12/03/23 2159 12/04/23 0715 12/04/23 1653 12/05/23 0842  NA 141 138  138 141  --  143  K 5.9* 5.5*  5.6* 5.8* 5.1 5.0  CL 98 100 100  --  102  CO2 29  --  29  --  29  GLUCOSE 113* 114* 81  --  78  BUN 44* 52* 47*  --  44*  CREATININE 1.51* 1.60* 1.80*  --  1.31*  CALCIUM  9.0  --  8.5*  --  8.4*   Liver Function Tests: Recent Labs  Lab 12/03/23 2156 12/04/23 0715 12/05/23 0842  AST 131* 106* 51*  ALT 74* 57* 39  ALKPHOS 62 54 51  BILITOT 0.8 0.9 0.4  PROT 7.4 6.2* 5.6*  ALBUMIN  4.2 3.5 3.1*   No results for input(s): LIPASE, AMYLASE in the last 168 hours. Recent Labs  Lab 12/04/23 0012 12/04/23 0713  AMMONIA 29 32   CBC: Recent Labs  Lab 12/03/23 2156 12/03/23 2159 12/05/23 0258 12/06/23 0236 12/07/23 1056  WBC 10.2  --  11.5* 11.7* 13.7*  NEUTROABS 8.4*  --   --   --   --   HGB 16.2 16.7  16.3 12.5* 12.2* 12.9*  HCT 53.8* 49.0  48.0 40.7 39.4 40.2  MCV 94.9  --  93.3  93.4 89.7  PLT 262  --  218 205 211   Cardiac Enzymes: No results for input(s): CKTOTAL, CKMB, CKMBINDEX,  TROPONINI in the last 168 hours. BNP: Invalid input(s): POCBNP CBG: Recent Labs  Lab 12/06/23 2037  GLUCAP 102*   D-Dimer No results for input(s): DDIMER in the last 72 hours. Hgb A1c No results for input(s): HGBA1C in the last 72 hours. Lipid Profile No results for input(s): CHOL, HDL, LDLCALC, TRIG, CHOLHDL, LDLDIRECT in the last 72 hours. Thyroid  function studies No results for input(s): TSH, T4TOTAL, T3FREE, THYROIDAB in the last 72 hours.  Invalid input(s): FREET3 Anemia work up No results for input(s): VITAMINB12, FOLATE, FERRITIN, TIBC, IRON, RETICCTPCT in the last 72 hours. Urinalysis    Component Value Date/Time   COLORURINE AMBER (A) 12/03/2023 0313   APPEARANCEUR CLOUDY (A) 12/03/2023 0313   LABSPEC 1.022 12/03/2023 0313   PHURINE 5.0 12/03/2023 0313   GLUCOSEU NEGATIVE 12/03/2023 0313   HGBUR LARGE (A) 12/03/2023 0313   BILIRUBINUR NEGATIVE 12/03/2023 0313   KETONESUR NEGATIVE 12/03/2023 0313   PROTEINUR >=300 (A) 12/03/2023 0313   UROBILINOGEN 0.2 01/28/2013 1204   NITRITE NEGATIVE 12/03/2023 0313   LEUKOCYTESUR TRACE (A) 12/03/2023 0313   Sepsis Labs Recent Labs  Lab 12/03/23 2156 12/05/23 0258 12/06/23 0236 12/07/23 1056  WBC 10.2 11.5* 11.7* 13.7*   Microbiology Recent Results (from the past 240 hours)  Blood culture (routine x 2)     Status: None (Preliminary result)   Collection Time: 12/03/23  9:41 PM   Specimen: BLOOD  Result Value Ref Range Status   Specimen Description BLOOD SITE NOT SPECIFIED  Final   Special Requests   Final    BOTTLES DRAWN AEROBIC AND ANAEROBIC Blood Culture results may not be optimal due to an inadequate volume of blood received in culture bottles   Culture   Final    NO GROWTH 4 DAYS Performed at [facility], [address], [place], [place] [address]    Report Status PENDING  Incomplete  Blood culture (routine x 2)     Status: None (Preliminary result)    Collection Time: 12/03/23  9:51 PM   Specimen: BLOOD  Result Value Ref Range Status   Specimen Description BLOOD SITE NOT SPECIFIED  Final   Special Requests   Final    BOTTLES DRAWN AEROBIC AND ANAEROBIC Blood Culture results may not be optimal due to an inadequate volume of blood received in culture bottles   Culture   Final    NO GROWTH 4 DAYS Performed at [facility], [address] [place], [place] [address]    Report Status PENDING  Incomplete  Resp panel by RT-PCR (RSV, Flu A&B, Covid) Anterior Nasal Swab     Status: None   Collection Time: 12/03/23 11:19 PM   Specimen: Anterior Nasal Swab  Result Value Ref Range Status   SARS Coronavirus 2 by RT PCR NEGATIVE NEGATIVE Final   Influenza A by PCR NEGATIVE NEGATIVE Final   Influenza B by PCR NEGATIVE NEGATIVE Final    Comment: (NOTE) The Xpert Xpress SARS-CoV-2/FLU/RSV plus assay is intended as an aid in the diagnosis of influenza from Nasopharyngeal swab specimens and should not be used as a sole basis for treatment. Nasal washings and aspirates are unacceptable for Xpert Xpress SARS-CoV-2/FLU/RSV testing.  Fact Sheet for Patients: bloggercourse.com  Fact Sheet for Healthcare Providers: seriousbroker.it  This test is not yet approved or cleared by the [place]  FDA and has  been authorized for detection and/or diagnosis of SARS-CoV-2 by FDA under an Emergency Use Authorization (EUA). This EUA will remain in effect (meaning this test can be used) for the duration of the COVID-19 declaration under Section 564(b)(1) of the Act, 21 U.S.C. section 360bbb-3(b)(1), unless the authorization is terminated or revoked.     Resp Syncytial Virus by PCR NEGATIVE NEGATIVE Final    Comment: (NOTE) Fact Sheet for Patients: bloggercourse.com  Fact Sheet for Healthcare Providers: seriousbroker.it  This test is not yet  approved or cleared by the [place]  FDA and has been authorized for detection and/or diagnosis of SARS-CoV-2 by FDA under an Emergency Use Authorization (EUA). This EUA will remain in effect (meaning this test can be used) for the duration of the COVID-19 declaration under Section 564(b)(1) of the Act, 21 U.S.C. section 360bbb-3(b)(1), unless the authorization is terminated or revoked.  Performed at [facility], [address], [place], [place] [address]   Urine Culture (for pregnant, neutropenic or urologic patients or patients with an indwelling urinary catheter)     Status: Abnormal   Collection Time: 12/04/23  5:44 AM   Specimen: Urine, Clean Catch  Result Value Ref Range Status   Specimen Description URINE, CLEAN CATCH  Final   Special Requests NONE  Final   Culture (A)  Final    <10,000 COLONIES/mL INSIGNIFICANT GROWTH Performed at [facility], [address], [place], [place] [address]    Report Status 12/05/2023 FINAL  Final  Respiratory (~20 pathogens) panel by PCR     Status: None   Collection Time: 12/04/23  5:47 AM   Specimen: Nasopharyngeal Swab; Respiratory  Result Value Ref Range Status   Adenovirus NOT DETECTED NOT DETECTED Final   Coronavirus 229E NOT DETECTED NOT DETECTED Final    Comment: (NOTE) The Coronavirus on the Respiratory Panel, DOES NOT test for the novel  Coronavirus (2019 nCoV)    Coronavirus HKU1 NOT DETECTED NOT DETECTED Final   Coronavirus NL63 NOT DETECTED NOT DETECTED Final   Coronavirus OC43 NOT DETECTED NOT DETECTED Final   Metapneumovirus NOT DETECTED NOT DETECTED Final   Rhinovirus / Enterovirus NOT DETECTED NOT DETECTED Final   Influenza A NOT DETECTED NOT DETECTED Final   Influenza B NOT DETECTED NOT DETECTED Final   Parainfluenza Virus 1 NOT DETECTED NOT DETECTED Final   Parainfluenza Virus 2 NOT DETECTED NOT DETECTED Final   Parainfluenza Virus 3 NOT DETECTED NOT DETECTED Final   Parainfluenza Virus 4 NOT DETECTED  NOT DETECTED Final   Respiratory Syncytial Virus NOT DETECTED NOT DETECTED Final   Bordetella pertussis NOT DETECTED NOT DETECTED Final   Bordetella Parapertussis NOT DETECTED NOT DETECTED Final   Chlamydophila pneumoniae NOT DETECTED NOT DETECTED Final   Mycoplasma pneumoniae NOT DETECTED NOT DETECTED Final    Comment: Performed at [facility], [address], [place], [place] [address]    FURTHER DISCHARGE INSTRUCTIONS:   Get Medicines reviewed and adjusted: Please take all your medications with you for your next visit with your Primary MD   Laboratory/radiological data: Please request your Primary MD to go over all hospital tests and procedure/radiological results at the follow up, please ask your Primary MD to get all Hospital records sent to his/her office.   In some cases, they will be blood work, cultures and biopsy results pending at the time of your discharge. Please request that your primary care M.D. goes through all the records of your hospital data and follows  up on these results.   Also Note the following: If you experience worsening of your admission symptoms, develop shortness of breath, life threatening emergency, suicidal or homicidal thoughts you must seek medical attention immediately by calling 911 or calling your MD immediately  if symptoms less severe.   You must read complete instructions/literature along with all the possible adverse reactions/side effects for all the Medicines you take and that have been prescribed to you. Take any new Medicines after you have completely understood and accpet all the possible adverse reactions/side effects.    patient was instructed, not to drive, operate heavy machinery, perform activities at heights, swimming or participation in water  activities or provide baby-sitting services while on Pain, Sleep and Anxiety Medications; until their outpatient Physician has advised to do so again. Also recommended to not to take more than  prescribed Pain, Sleep and Anxiety Medications.  It is not advisable to combine anxiety, sleep and pain medications without talking with your primary care provider.     Wear Seat belts while driving.   Please note: You were cared for by a hospitalist during your hospital stay. Once you are discharged, your primary care physician will handle any further medical issues. Please note that NO REFILLS for any discharge medications will be authorized once you are discharged, as it is imperative that you return to your primary care physician (or establish a relationship with a primary care physician if you do not have one) for your post hospital discharge needs so that they can reassess your need for medications and monitor your lab values  Time coordinating discharge: Over 30 minutes  SIGNED:   Fredia Skeeter, MD  [facility] 12/07/2023, 4:15 PM *Please note that this is a verbal dictation therefore any spelling or grammatical errors are due to the Dragon Medical One system interpretation. If 7PM-7AM, please contact night-coverage [email or web address]

## 2023-12-07 NOTE — Care Management Important Message (Signed)
 Important Message  Patient Details  Name: Angel Cooper MRN: [id number] Date of Birth: [birth year]   Important Message Given:  Yes - Medicare IM     Vonzell Arrie Sharps 12/07/2023, 10:42 AM

## 2023-12-07 NOTE — Progress Notes (Signed)
 Consult received for assessment of nutrition requirements. The patient is pending discharge to Hospice. D/W Dr Vernon who advised to disregard consult.  Angel Ruth, MS, RDN, LDN [place]. [facility] See AMION for contact information

## 2023-12-07 NOTE — Progress Notes (Signed)
   Palliative Medicine Inpatient Follow Up Note HPI: Angel Cooper is a 89 y.o. male with medical history significant of with history of CKD stage IIIb, A-fib on Eliquis , HFrEF, CAD status post CABG, hypertension, hyperlipidemia, gout, GERD, PUD, ureteral carcinoma status post right nephroureterectomy in 2014 presented to the ED via EMS for evaluation of altered mental status and generalized weakness.  Palliative care has been requested to support additional goals of care conversations.   Today's Discussion 12/07/2023  *Please note that this is a verbal dictation therefore any spelling or grammatical errors are due to the Dragon Medical One system interpretation.  Chart reviewed inclusive of vital signs, progress notes, laboratory results, and diagnostic images.   No nursing concerns noted this morning  I met with Angel Cooper at bedside this morning. He is awake, aware of self and place. He is not oriented to situation. He denies pain, nausea, shortness of breath this morning.   Plan for transition to home with [facility] this afternoon.   Questions and concerns addressed/Palliative Support Provided.   Objective Assessment: Vital Signs Vitals:   12/07/23 0908 12/07/23 1114  BP: (!) 164/77 (!) 146/78  Pulse: 74 79  Resp: 18 18  Temp: 98.6 F (37 C) 97.6 F (36.4 C)  SpO2: 100% 98%    Intake/Output Summary (Last 24 hours) at 12/07/2023 1203 Last data filed at 12/07/2023 0900 Gross per 24 hour  Intake 855.32 ml  Output 1300 ml  Net -444.68 ml   Last Weight  Most recent update: 12/05/2023  9:22 AM    Weight  41.4 kg (91 lb 3.2 oz)            Gen: Elderly Caucasian male chronically ill in appearance HEENT: Dry mucous membranes CV: Regular rate and irregular rhythm  PULM: On room air slightly tachypneic ABD: soft/nontender  EXT: Muscle wasting in all 4 extremities Neuro: A+O x2  SUMMARY OF RECOMMENDATIONS   DNAR/DNI    Continue IVF and Antibiotics until  discharge - family aware this will not be given once transitioned to home   Transition home with [facility] once DME are delivered  ______________________________________________________________________________________ [facility] Palliative Medicine Team Team Cell Phone: [phone] Please utilize secure chat with additional questions, if there is no response within 30 minutes please call the above phone number  Time Spent:25  Palliative Medicine Team providers are available by phone from 7am to 7pm daily and can be reached through the team cell phone.  Should this patient require assistance outside of these hours, please call the patient's attending physician.

## 2023-12-07 NOTE — Progress Notes (Signed)
Patient refused head to toe assessment.

## 2023-12-07 NOTE — TOC Transition Note (Signed)
 Transition of Care [facility]) - Discharge Note   Patient Details  Name: Angel Cooper MRN: [id number] Date of Birth: [birth year]  Transition of Care [facility]) CM/SW Contact:  Waddell Barnie Rama, RN Phone Number: 12/07/2023, 4:17 PM   Clinical Narrative:    NCM received call from son, [name], that the DME has arrived,  NCM scheduled [facility] transport, they will be here in a couple of hours for transport.    Final next level of care: Home w Hospice Care Barriers to Discharge: Continued Medical Work up   Patient Goals and CMS Choice     Choice offered to / list presented to : Adult Children (patients son [name])      Discharge Placement                       Discharge Plan and Services Additional resources added to the After Visit Summary for   In-house Referral: Clinical Social Work                          [facility] Agency: [facility] Date [facility] Agency Contacted: 12/06/23 Time HH Agency Contacted: 1101 Representative spoke with at [facility] Agency: [facility] Secure Chat group  Social Drivers of Health (SDOH) Interventions SDOH Screenings   Food Insecurity: No Food Insecurity (12/04/2023)  Housing: Low Risk  (12/04/2023)  Transportation Needs: No Transportation Needs (12/04/2023)  Utilities: Not At Risk (12/04/2023)  Social Connections: Socially Isolated (12/04/2023)  Tobacco Use: Medium Risk (12/04/2023)     Readmission Risk Interventions     No data to display

## 2023-12-07 NOTE — TOC Progression Note (Signed)
 Transition of Care [facility]) - Progression Note    Patient Details  Name: Angel Cooper MRN: [id number] Date of Birth: Sep 22, 1930  Transition of Care [facility]) CM/SW Contact  Waddell Barnie Rama, RN Phone Number: 12/07/2023, 10:34 AM  Clinical Narrative:    Plan is for dc home with hospice with [facility], awaiting for DME to be delivered to the patient's home, (hospital bed and home oxygen).  Once delivered patient can be discharged.    Expected Discharge Plan: Skilled Nursing Facility Barriers to Discharge: Continued Medical Work up               Expected Discharge Plan and Services In-house Referral: Clinical Social Work     Living arrangements for the past 2 months: Single Family Home                             HH Agency: [facility] Date [facility] Agency Contacted: 12/06/23 Time HH Agency Contacted: 1101 Representative spoke with at [facility] Agency: [facility] Secure Chat group   Social Drivers of Health (SDOH) Interventions SDOH Screenings   Food Insecurity: No Food Insecurity (12/04/2023)  Housing: Low Risk  (12/04/2023)  Transportation Needs: No Transportation Needs (12/04/2023)  Utilities: Not At Risk (12/04/2023)  Social Connections: Socially Isolated (12/04/2023)  Tobacco Use: Medium Risk (12/04/2023)    Readmission Risk Interventions     No data to display

## 2023-12-07 NOTE — Plan of Care (Signed)

## 2023-12-07 NOTE — Progress Notes (Signed)
 Physical Therapy Treatment Patient Details Name: Angel Cooper MRN: [id number] DOB: 1930-06-07 Today's Date: 12/07/2023   History of Present Illness 89 y/o M adm 12/03/23 with confusion, hypoxia, CHF, NSTEMI. UDS opiate +.  PMH: CKD, Afib, renal CA s/p resection, HFrEF, CAD s/p CABG, HTN, HLD, gout    PT Comments  Pt pleasantly confused with primofit off on arrival and needing assist to use urinal EOB. Pt with painful knees, maintaining crouched posture with mobility and mod-max assist to transfer OOB. Son present end of session and discussed +2 assist with WC to enter home vs ambulance transport with the need for max assist for transfers at this time. Pt encouraged to perform LB movement as able to maintain strength and ROM. Per note plan for family assist and 24hr coverage with hospice. Should this not occur, pt will benefit from continued inpatient follow up therapy, <3 hours/day     If plan is discharge home, recommend the following: Assistance with cooking/housework;Assist for transportation;Help with stairs or ramp for entrance;A lot of help with walking and/or transfers;A lot of help with bathing/dressing/bathroom   Can travel by private vehicle     No  Equipment Recommendations  Hospital bed;Wheelchair (measurements PT);Wheelchair cushion (measurements PT)    Recommendations for Other Services       Precautions / Restrictions Precautions Precautions: Fall Recall of Precautions/Restrictions: Impaired     Mobility  Bed Mobility Overal bed mobility: Needs Assistance       Supine to sit: Min assist, HOB elevated     General bed mobility comments: HOb 30 degrees, increased time to fully pivot to EOB and elevate trunk. Min assist and cues to scoot fully to EOB. Supervision for sitting balance with posterior bias    Transfers Overall transfer level: Needs assistance   Transfers: Sit to/from Stand, Bed to chair/wheelchair/BSC Sit to Stand: Mod assist, From elevated  surface     Squat pivot transfers: Max assist     General transfer comment: mod assist to rise from bed with UB support on therapist with face to face technique. max assist for squat pivot bed to chair with use of belt. Additional stand x 2 from chair with mod +2 and max assist with pad under sacrum. Son present and educated for level of assist required and needed to support pelvis. Pt maintains crouched posture and unable to rise to standing    Ambulation/Gait               General Gait Details: unable   Stairs             Wheelchair Mobility     Tilt Bed    Modified Rankin (Stroke Patients Only)       Balance Overall balance assessment: Needs assistance Sitting-balance support: No upper extremity supported, Feet supported Sitting balance-Leahy Scale: Fair Sitting balance - Comments: CGA Postural control: Posterior lean Standing balance support: Bilateral upper extremity supported, During functional activity Standing balance-Leahy Scale: Poor Standing balance comment: UB support on therapist                            Communication Communication Communication: No apparent difficulties  Cognition Arousal: Alert Behavior During Therapy: WFL for tasks assessed/performed   PT - Cognitive impairments: Memory, Problem solving, Safety/Judgement, Orientation                       PT - Cognition Comments: pt oriented to self  and place, pt with slow processing and increased time for all transfers Following commands: Intact      Cueing Cueing Techniques: Verbal cues, Tactile cues, Visual cues  Exercises      General Comments        Pertinent Vitals/Pain Pain Assessment Faces Pain Scale: Hurts even more Pain Location: bil knees Pain Descriptors / Indicators: Aching, Guarding Pain Intervention(s): Limited activity within patient's tolerance, Monitored during session, Repositioned    Home Living                           Prior Function            PT Goals (current goals can now be found in the care plan section) Progress towards PT goals: Progressing toward goals    Frequency    Min 2X/week      PT Plan      Co-evaluation              AM-PAC PT 6 Clicks Mobility   Outcome Measure  Help needed turning from your back to your side while in a flat bed without using bedrails?: A Little Help needed moving from lying on your back to sitting on the side of a flat bed without using bedrails?: A Lot Help needed moving to and from a bed to a chair (including a wheelchair)?: A Lot Help needed standing up from a chair using your arms (e.g., wheelchair or bedside chair)?: A Lot Help needed to walk in hospital room?: Total Help needed climbing 3-5 steps with a railing? : Total 6 Click Score: 11    End of Session Equipment Utilized During Treatment: Gait belt Activity Tolerance: Patient limited by fatigue Patient left: with call bell/phone within reach;with chair alarm set;in chair;with family/visitor present Nurse Communication: Mobility status PT Visit Diagnosis: Muscle weakness (generalized) (M62.81);Unsteadiness on feet (R26.81);Difficulty in walking, not elsewhere classified (R26.2)     Time: 9249-9183 PT Time Calculation (min) (ACUTE ONLY): 26 min  Charges:    $Therapeutic Activity: 23-37 mins PT General Charges $$ ACUTE PT VISIT: 1 Visit                     Lenoard SQUIBB, PT Acute Rehabilitation Services Office: [phone]    [name] Angel Cooper 12/07/2023, 10:52 AM

## 2023-12-07 NOTE — Plan of Care (Signed)
   Problem: Education: Goal: Knowledge of General Education information will improve Description: Including pain rating scale, medication(s)/side effects and non-pharmacologic comfort measures Outcome: Progressing   Problem: Clinical Measurements: Goal: Ability to maintain clinical measurements within normal limits will improve Outcome: Progressing Goal: Diagnostic test results will improve Outcome: Progressing

## 2023-12-08 LAB — CULTURE, BLOOD (ROUTINE X 2)
Culture: NO GROWTH
Culture: NO GROWTH

## 2024-01-06 ENCOUNTER — Ambulatory Visit: Admitting: Nurse Practitioner
